# Patient Record
Sex: Female | Born: 1985 | Race: Black or African American | Hispanic: No | Marital: Single | Smoking: Former smoker
Health system: Southern US, Community
[De-identification: ages and names within clinical notes are randomized; demographics above are authoritative.]

## PROBLEM LIST (undated history)

## (undated) DIAGNOSIS — B999 Unspecified infectious disease: Secondary | ICD-10-CM

## (undated) DIAGNOSIS — R51 Headache: Secondary | ICD-10-CM

## (undated) DIAGNOSIS — N926 Irregular menstruation, unspecified: Secondary | ICD-10-CM

## (undated) DIAGNOSIS — K219 Gastro-esophageal reflux disease without esophagitis: Secondary | ICD-10-CM

## (undated) DIAGNOSIS — Z659 Problem related to unspecified psychosocial circumstances: Secondary | ICD-10-CM

## (undated) DIAGNOSIS — O0930 Supervision of pregnancy with insufficient antenatal care, unspecified trimester: Secondary | ICD-10-CM

## (undated) HISTORY — DX: Headache: R51

## (undated) HISTORY — PX: TONSILLECTOMY AND ADENOIDECTOMY: SUR1326

## (undated) HISTORY — DX: Unspecified infectious disease: B99.9

## (undated) HISTORY — DX: Gastro-esophageal reflux disease without esophagitis: K21.9

## (undated) HISTORY — PX: TONSILLECTOMY: SUR1361

---
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
## 2005-06-13 ENCOUNTER — Emergency Department (HOSPITAL_COMMUNITY): Admission: EM | Admit: 2005-06-13 | Discharge: 2005-06-13 | Payer: Self-pay | Admitting: Family Medicine

## 2005-10-25 ENCOUNTER — Emergency Department (HOSPITAL_COMMUNITY): Admission: EM | Admit: 2005-10-25 | Discharge: 2005-10-25 | Payer: Self-pay | Admitting: Family Medicine

## 2007-03-26 ENCOUNTER — Emergency Department (HOSPITAL_COMMUNITY): Admission: EM | Admit: 2007-03-26 | Discharge: 2007-03-26 | Payer: Self-pay | Admitting: Family Medicine

## 2007-07-05 DIAGNOSIS — B999 Unspecified infectious disease: Secondary | ICD-10-CM

## 2007-07-05 HISTORY — DX: Unspecified infectious disease: B99.9

## 2007-08-12 ENCOUNTER — Other Ambulatory Visit: Payer: Self-pay | Admitting: Emergency Medicine

## 2007-08-12 ENCOUNTER — Ambulatory Visit: Payer: Self-pay | Admitting: Obstetrics & Gynecology

## 2007-08-12 ENCOUNTER — Inpatient Hospital Stay (HOSPITAL_COMMUNITY): Admission: AD | Admit: 2007-08-12 | Discharge: 2007-08-15 | Payer: Self-pay | Admitting: Obstetrics and Gynecology

## 2007-08-12 ENCOUNTER — Other Ambulatory Visit: Payer: Self-pay

## 2007-08-12 ENCOUNTER — Encounter: Payer: Self-pay | Admitting: Obstetrics & Gynecology

## 2008-11-02 ENCOUNTER — Emergency Department (HOSPITAL_COMMUNITY): Admission: EM | Admit: 2008-11-02 | Discharge: 2008-11-02 | Payer: Self-pay | Admitting: Emergency Medicine

## 2008-12-13 ENCOUNTER — Emergency Department (HOSPITAL_COMMUNITY): Admission: EM | Admit: 2008-12-13 | Discharge: 2008-12-13 | Payer: Self-pay | Admitting: Emergency Medicine

## 2009-02-15 IMAGING — US US OB COMP +14 WK
1 series · 11 of 11 positions shown · non-contrast
Comparison: none

OBSTETRICAL ULTRASOUND:

 This ultrasound exam was performed in the [HOSPITAL] Ultrasound Department.  The OB US report was generated in the AS system, and faxed to the ordering physician.  This report is also available in [REDACTED] PACS.

[Series 1: us ob comp +14 wk · 0.33mm/px · 11 of 11 slices shown]
[im 1/11]
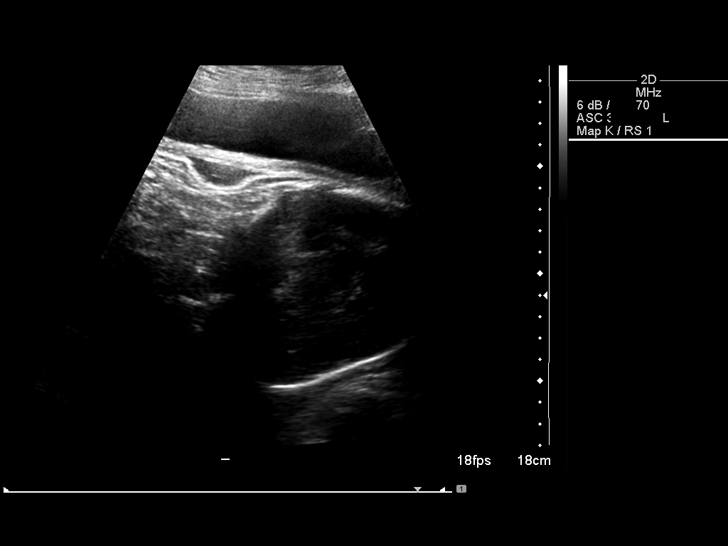
[im 2/11]
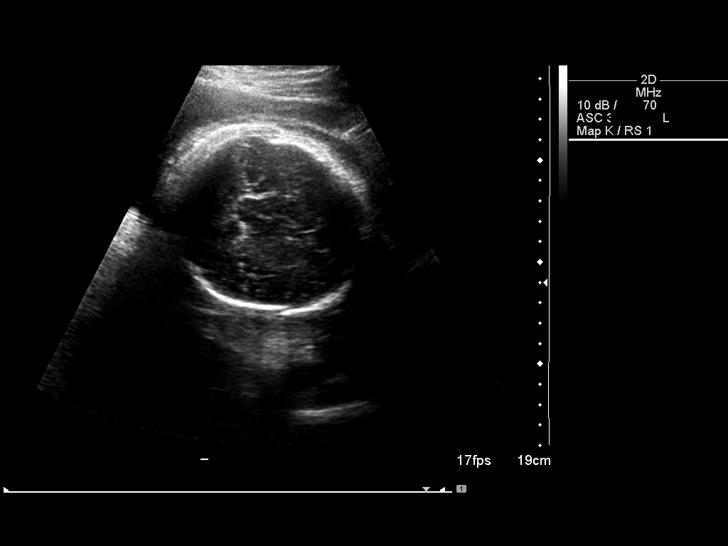
[im 3/11]
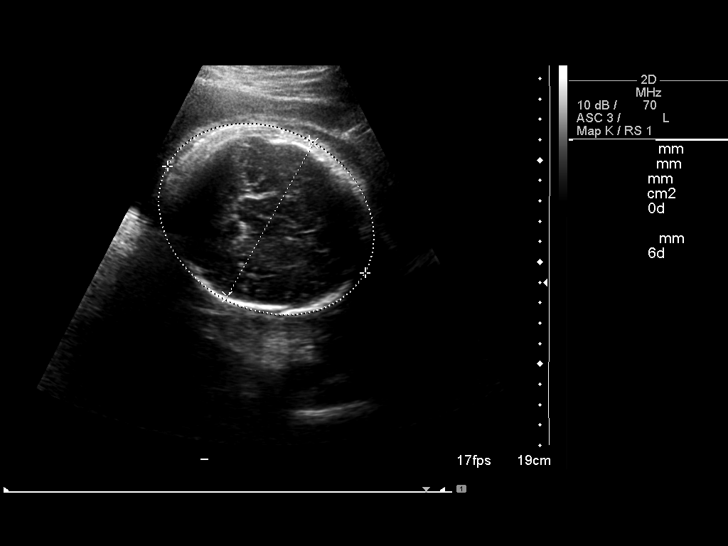
[im 4/11]
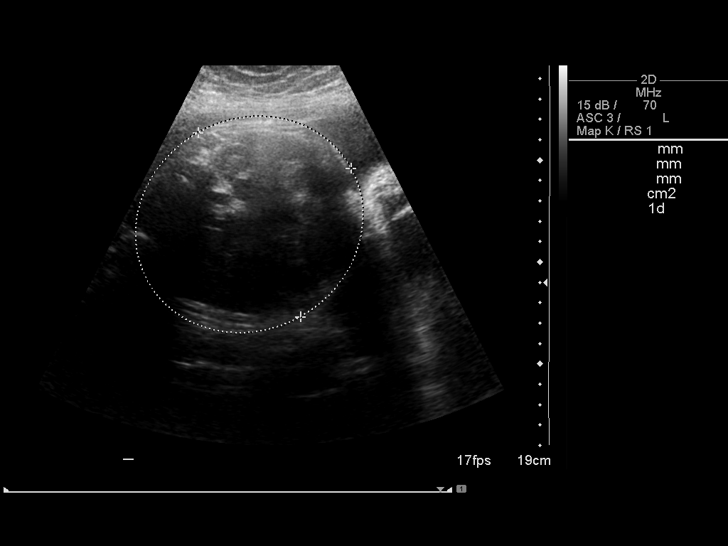
[im 5/11]
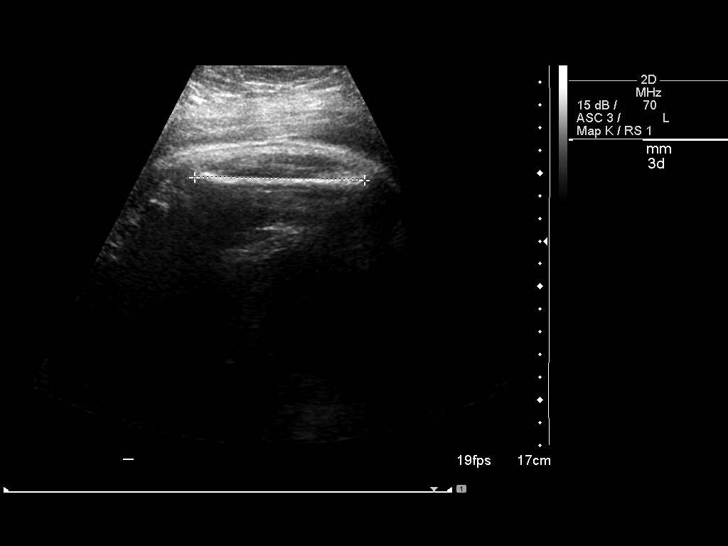
[im 6/11]
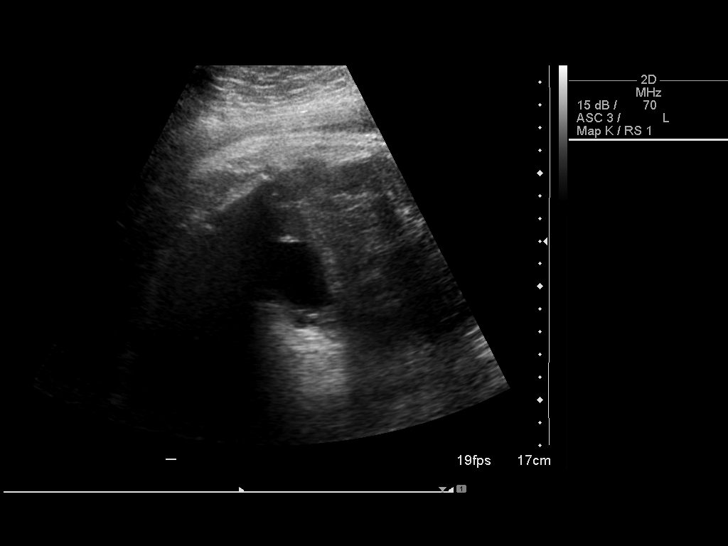
[im 7/11]
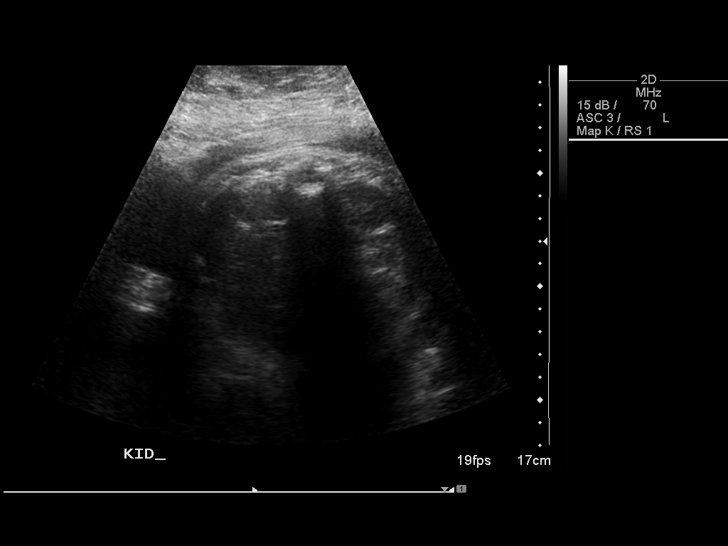
[im 8/11]
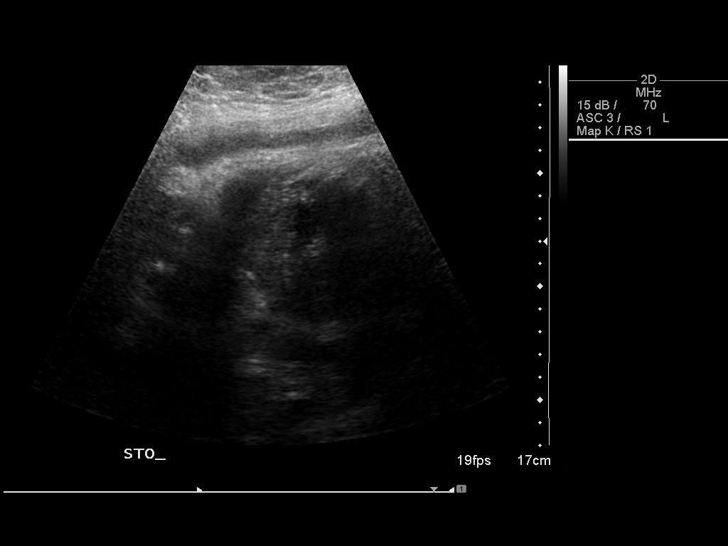
[im 9/11]
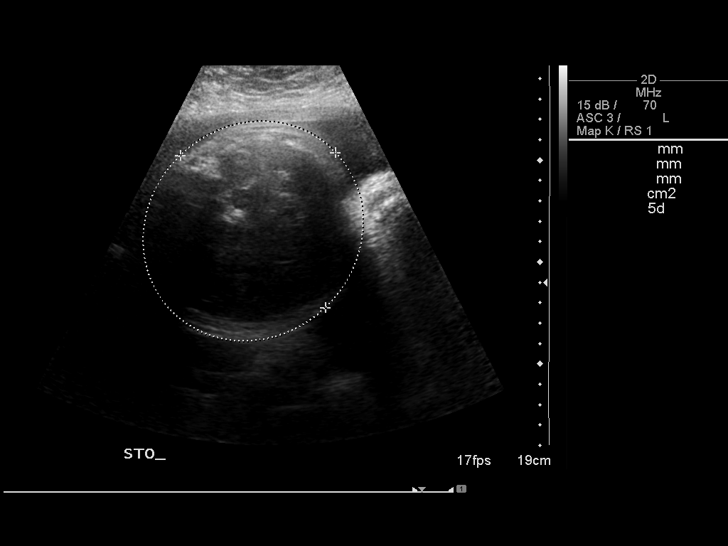
[im 10/11]
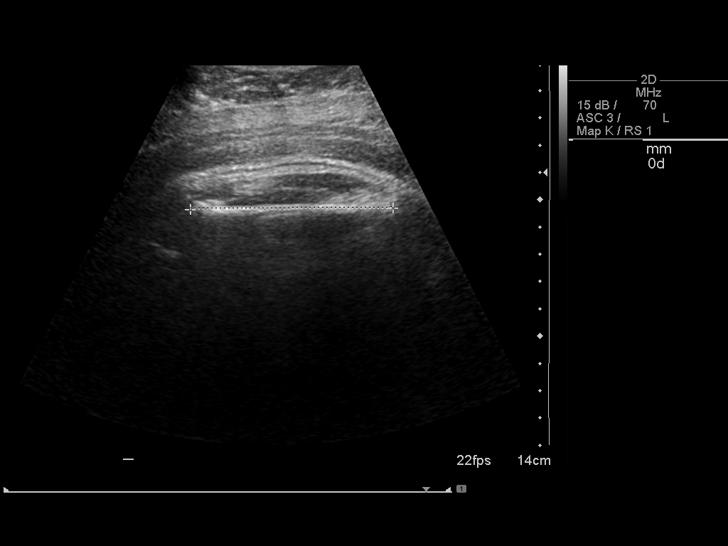
[im 11/11]
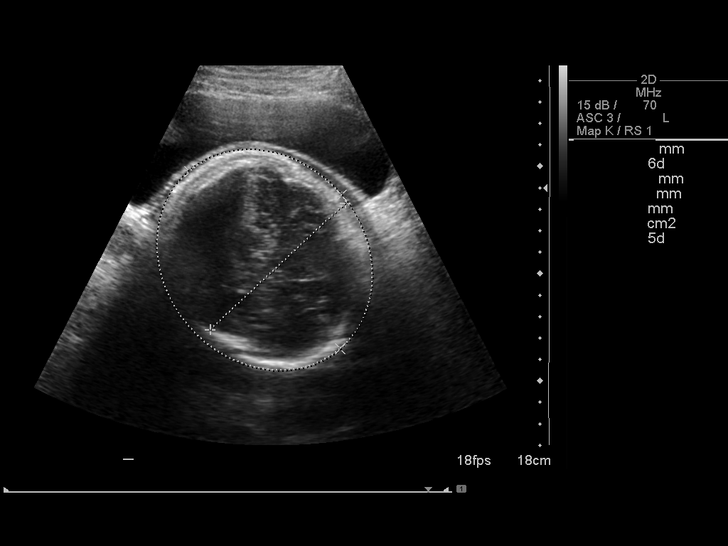

[11 of 11 positions shown; findings below may reference images not displayed]

IMPRESSION: See AS Obstetric US report.

## 2009-04-02 ENCOUNTER — Emergency Department (HOSPITAL_COMMUNITY): Admission: EM | Admit: 2009-04-02 | Discharge: 2009-04-02 | Payer: Self-pay | Admitting: Family Medicine

## 2009-08-16 ENCOUNTER — Emergency Department (HOSPITAL_COMMUNITY): Admission: EM | Admit: 2009-08-16 | Discharge: 2009-08-16 | Payer: Self-pay | Admitting: Family Medicine

## 2009-11-23 ENCOUNTER — Emergency Department (HOSPITAL_COMMUNITY): Admission: EM | Admit: 2009-11-23 | Discharge: 2009-11-24 | Payer: Self-pay | Admitting: Emergency Medicine

## 2010-04-09 ENCOUNTER — Emergency Department (HOSPITAL_COMMUNITY): Admission: EM | Admit: 2010-04-09 | Discharge: 2010-04-09 | Payer: Self-pay | Admitting: Emergency Medicine

## 2010-05-09 IMAGING — CT CT ABDOMEN W/ CM
2 of 4 series · 16 of 46 positions shown, 18 images · IV contrast (APPLIED)
Comparison: None.

CT ABDOMEN

CLINICAL DATA: 23-year-old female with mid abdominal pain and
nausea.

CT ABDOMEN AND PELVIS WITH CONTRAST
TECHNIQUE: Multidetector CT imaging of the abdomen and pelvis was
performed using the standard protocol following bolus
administration of intravenous contrast.
Contrast: 125 ml Lmnipaque-T55.

[Series 2: abd_pel 5.0 b40f st · axial · 0.95mm/px · z∈[-420,+0]mm · 13 of 94 slices shown, 15 images]
[im 5/94  soft-tissue]
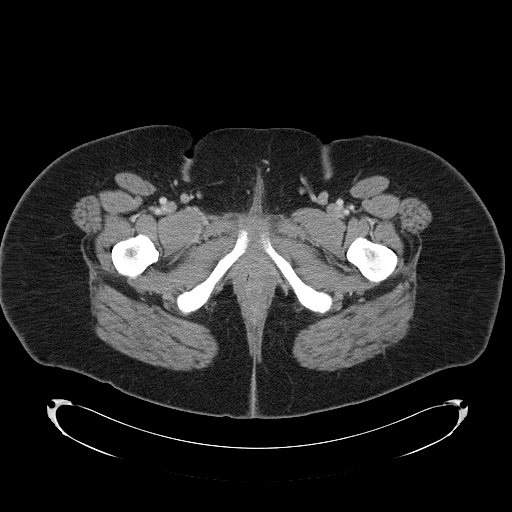
[im 5/94  bone]
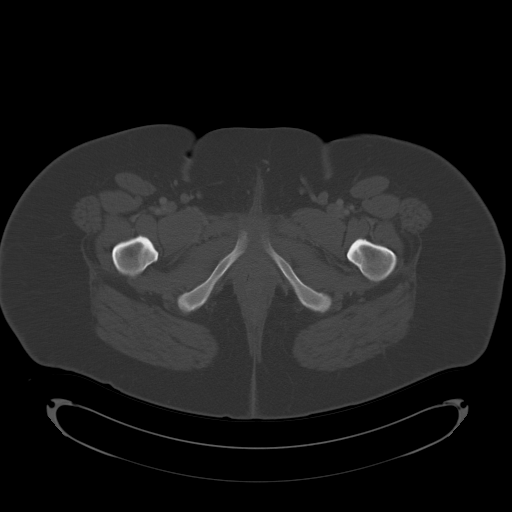
[im 13/94  soft-tissue]
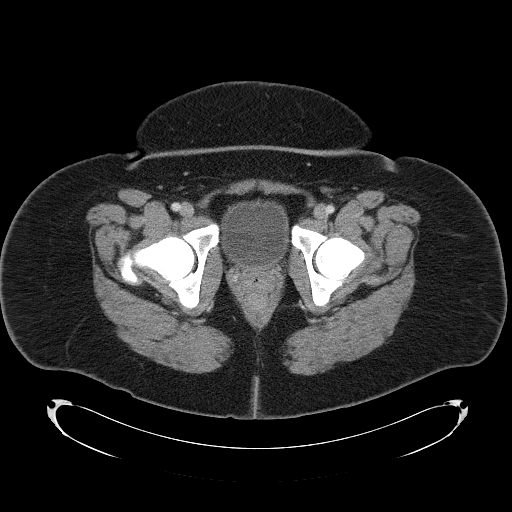
[im 21/94  soft-tissue]
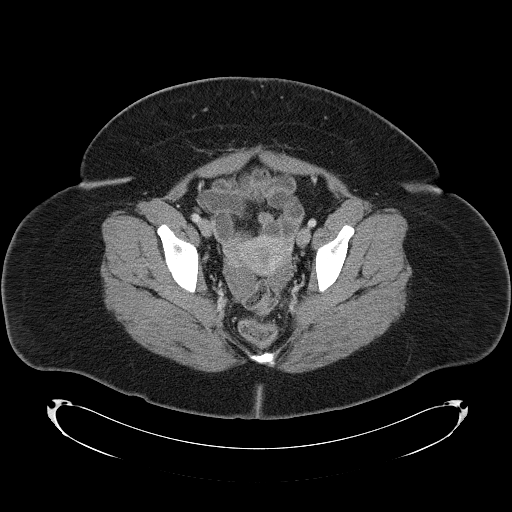
[im 25/94  soft-tissue]
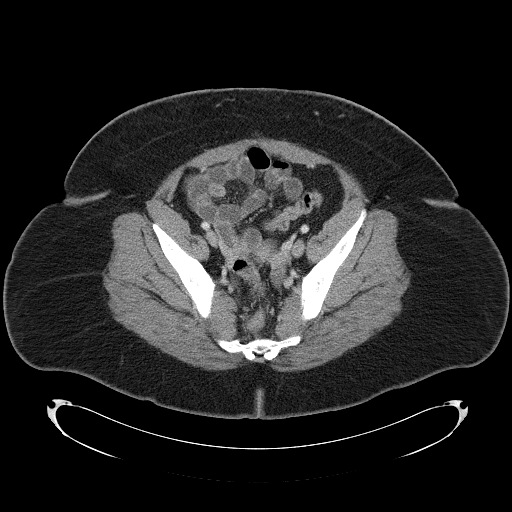
[im 33/94  soft-tissue]
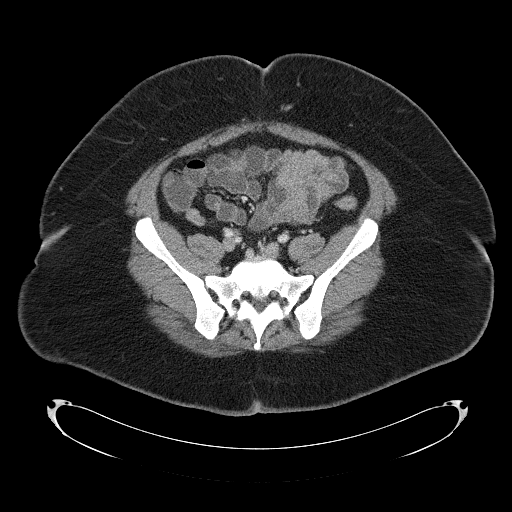
[im 41/94  soft-tissue]
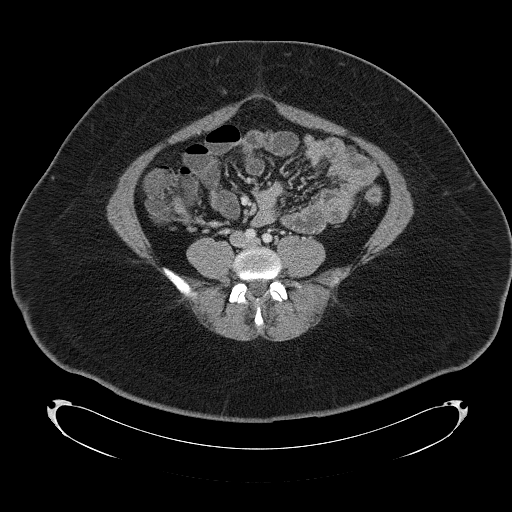
[im 49/94  soft-tissue]
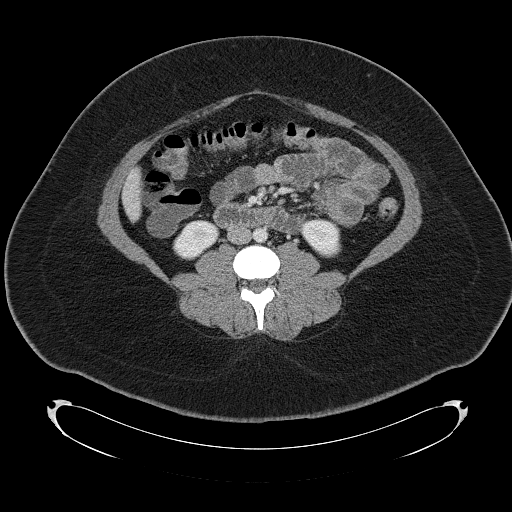
[im 53/94  soft-tissue]
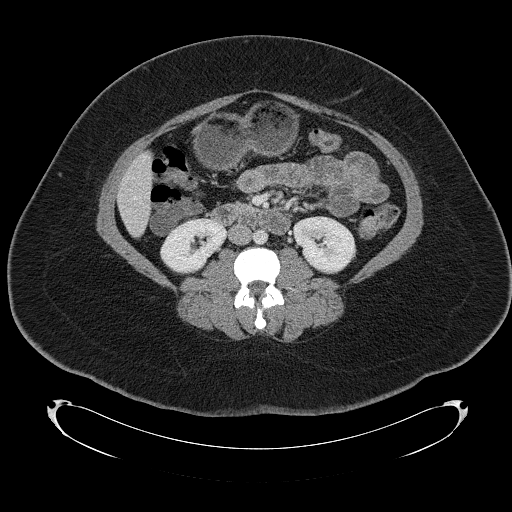
[im 61/94  soft-tissue]
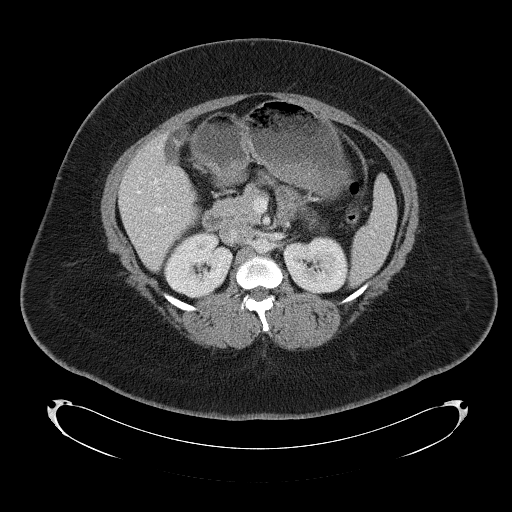
[im 61/94  bone]
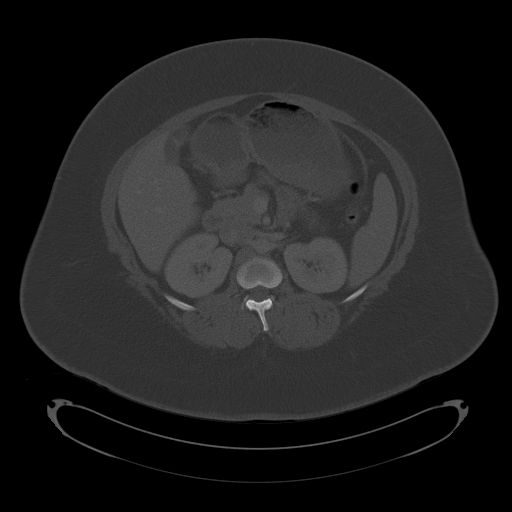
[im 69/94  soft-tissue]
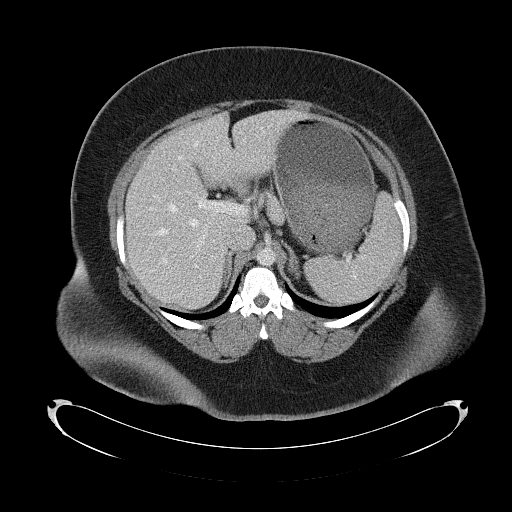
[im 73/94  soft-tissue]
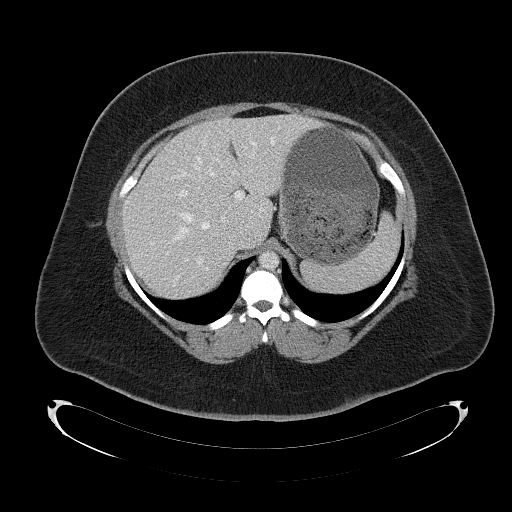
[im 81/94  soft-tissue]
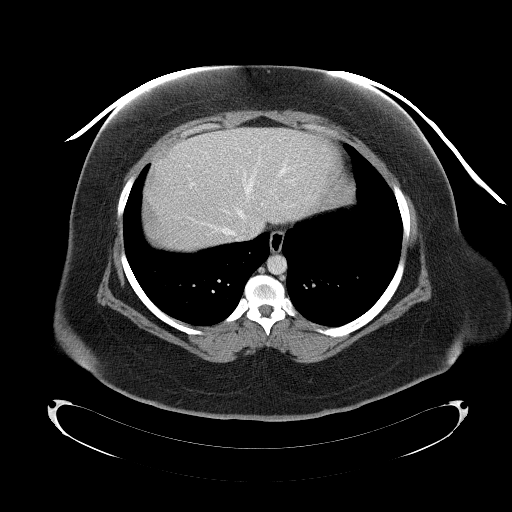
[im 89/94  soft-tissue]
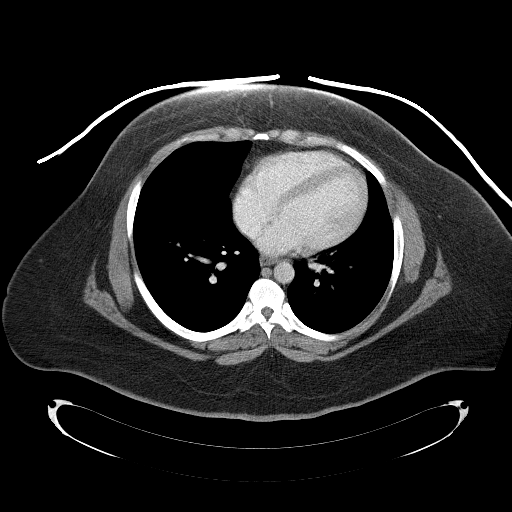

[Series 602: coronal · coronal · 0.96mm/px · 3 of 97 slices shown]
[im 33/97  soft-tissue]
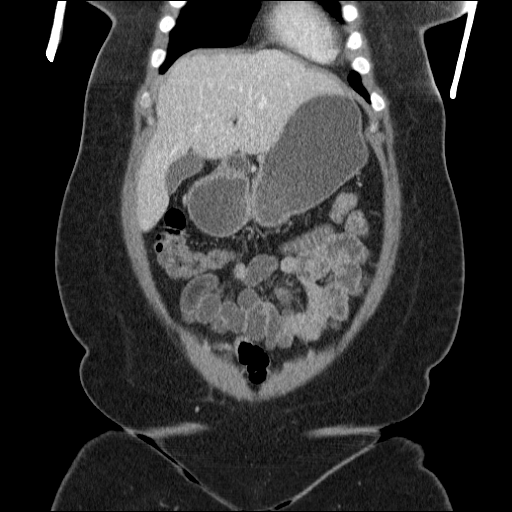
[im 43/97  soft-tissue]
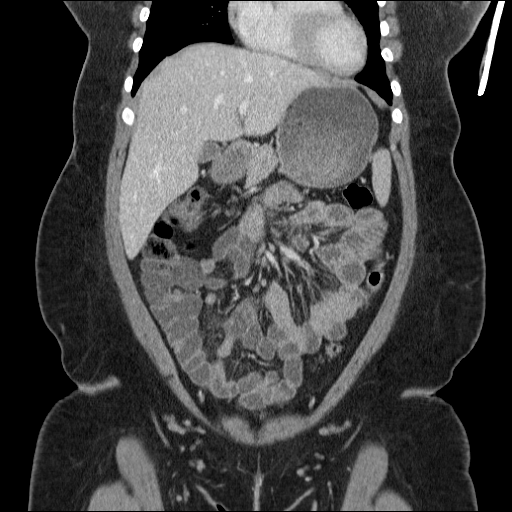
[im 54/97  soft-tissue]
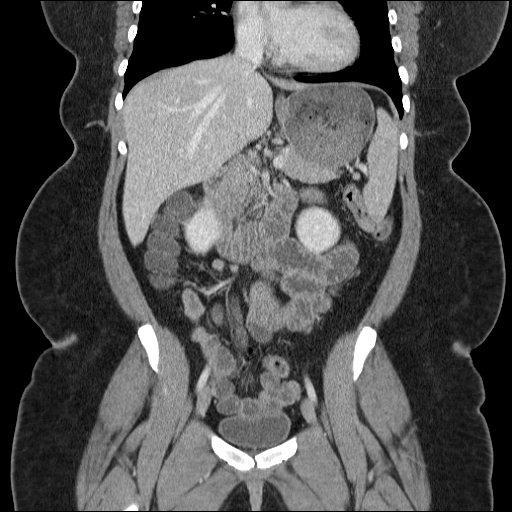

[16 of 46 positions shown; findings below may reference images not displayed]

FINDINGS: Lung bases are clear. No acute osseous abnormality
identified.  The liver, gallbladder, spleen, pancreas, adrenal
glands, and kidneys are within normal limits.  The portal venous
system and major abdominal arterial structures are within normal
limits.  The stomach contains water, which was used for enteric
contrast in this case, as well as food debris.  Aside from being
moderately distended, the stomach is within normal limits.  The
duodenum is within normal limits.  Proximal small bowel loops are
within normal limits.  Multiple fluid filled small bowel loops are
noted, but none appear abnormally dilated.  The terminal ileum is
within normal limits.  The appendix is suboptimally delineated, but
there are no pericecal inflammatory changes.  The ascending colon
is unremarkable.  From hepatic flexure on word, the colon is
relatively decompressed which is felt to account for the suggestion
of mild wall thickening.  Mesenteric lymph nodes are noted but not
enlarged by CT criteria, measuring up to 9 mm in short axis.  No
free fluid or focal inflammatory changes.
IMPRESSION: No acute or inflammatory findings identified in the abdomen.

CT PELVIS
FINDINGS: No pelvic free fluid.  The bladder is unremarkable.  The
adnexa are mildly prominent bilaterally (series 2 image 73), likely
physiologic in this age.  Otherwise, the uterus and parametrium are
within normal limits.  The distal colon is relatively decompressed
and unremarkable.  Major pelvic arterial structures are within
normal limits.  No focal inflammatory changes identified. No acute
osseous abnormality identified.
IMPRESSION: No acute or inflammatory findings identified in the pelvis.

## 2010-06-24 ENCOUNTER — Observation Stay (HOSPITAL_COMMUNITY)
Admission: EM | Admit: 2010-06-24 | Discharge: 2010-06-24 | Payer: Self-pay | Source: Home / Self Care | Attending: Emergency Medicine | Admitting: Emergency Medicine

## 2010-06-24 ENCOUNTER — Encounter (INDEPENDENT_AMBULATORY_CARE_PROVIDER_SITE_OTHER): Payer: Self-pay | Admitting: Emergency Medicine

## 2010-07-04 DIAGNOSIS — K219 Gastro-esophageal reflux disease without esophagitis: Secondary | ICD-10-CM

## 2010-07-04 HISTORY — DX: Gastro-esophageal reflux disease without esophagitis: K21.9

## 2010-08-18 ENCOUNTER — Emergency Department (HOSPITAL_COMMUNITY)
Admission: EM | Admit: 2010-08-18 | Discharge: 2010-08-18 | Disposition: A | Discharge status: Home / Self Care | Payer: Self-pay | Attending: Emergency Medicine | Admitting: Emergency Medicine

## 2010-08-18 DIAGNOSIS — H81319 Aural vertigo, unspecified ear: Secondary | ICD-10-CM | POA: Insufficient documentation

## 2010-08-18 LAB — URINE MICROSCOPIC-ADD ON

## 2010-08-18 LAB — POCT I-STAT, CHEM 8
BUN: 6 mg/dL (ref 6–23)
Creatinine, Ser: 0.7 mg/dL (ref 0.4–1.2)
Sodium: 140 mEq/L (ref 135–145)
TCO2: 25 mmol/L (ref 0–100)

## 2010-08-18 LAB — URINALYSIS, ROUTINE W REFLEX MICROSCOPIC
Hgb urine dipstick: NEGATIVE
Protein, ur: NEGATIVE mg/dL
Specific Gravity, Urine: 1.024 (ref 1.005–1.030)
Urine Glucose, Fasting: NEGATIVE mg/dL

## 2010-09-13 LAB — URINALYSIS, ROUTINE W REFLEX MICROSCOPIC
Ketones, ur: NEGATIVE mg/dL
Nitrite: NEGATIVE
Protein, ur: NEGATIVE mg/dL

## 2010-09-13 LAB — POCT I-STAT, CHEM 8
Chloride: 106 mEq/L (ref 96–112)
Glucose, Bld: 92 mg/dL (ref 70–99)
HCT: 34 % — ABNORMAL LOW (ref 36.0–46.0)
Potassium: 3.7 mEq/L (ref 3.5–5.1)
Sodium: 141 mEq/L (ref 135–145)

## 2010-09-13 LAB — POCT CARDIAC MARKERS
CKMB, poc: 1.4 ng/mL (ref 1.0–8.0)
CKMB, poc: <1 ng/mL — ABNORMAL LOW (ref 1.0–8.0)
CKMB, poc: <1 ng/mL — ABNORMAL LOW (ref 1.0–8.0)
Myoglobin, poc: 43.4 ng/mL (ref 12–200)
Myoglobin, poc: 48 ng/mL (ref 12–200)
Troponin i, poc: <0.05 ng/mL (ref 0.00–0.09)
Troponin i, poc: <0.05 ng/mL (ref 0.00–0.09)

## 2010-09-13 LAB — HEPATIC FUNCTION PANEL
ALT: 12 U/L (ref 0–35)
Indirect Bilirubin: 0.1 mg/dL — ABNORMAL LOW (ref 0.3–0.9)
Total Protein: 6.4 g/dL (ref 6.0–8.3)

## 2010-09-13 LAB — RAPID URINE DRUG SCREEN, HOSP PERFORMED
Benzodiazepines: NOT DETECTED
Cocaine: NOT DETECTED
Tetrahydrocannabinol: POSITIVE — AB

## 2010-09-13 LAB — DIFFERENTIAL
Basophils Absolute: 0 10*3/uL (ref 0.0–0.1)
Basophils Relative: 0 % (ref 0–1)
Eosinophils Relative: 4 % (ref 0–5)
Lymphocytes Relative: 31 % (ref 12–46)
Neutro Abs: 5.2 10*3/uL (ref 1.7–7.7)

## 2010-09-13 LAB — CBC
MCV: 82.1 fL (ref 78.0–100.0)
Platelets: 219 10*3/uL (ref 150–400)
RDW: 13.3 % (ref 11.5–15.5)
WBC: 8.8 10*3/uL (ref 4.0–10.5)

## 2010-09-16 LAB — RAPID URINE DRUG SCREEN, HOSP PERFORMED
Amphetamines: NOT DETECTED
Barbiturates: NOT DETECTED
Benzodiazepines: NOT DETECTED
Cocaine: NOT DETECTED
Opiates: NOT DETECTED
Tetrahydrocannabinol: POSITIVE — AB

## 2010-09-16 LAB — CBC
Hemoglobin: 13.2 g/dL (ref 12.0–15.0)
Platelets: 209 10*3/uL (ref 150–400)
RBC: 4.69 MIL/uL (ref 3.87–5.11)
WBC: 8.9 10*3/uL (ref 4.0–10.5)

## 2010-09-16 LAB — DIFFERENTIAL
Basophils Relative: 0 % (ref 0–1)
Lymphs Abs: 2.9 10*3/uL (ref 0.7–4.0)
Monocytes Relative: 6 % (ref 3–12)
Neutro Abs: 5.2 10*3/uL (ref 1.7–7.7)
Neutrophils Relative %: 59 % (ref 43–77)

## 2010-09-16 LAB — BASIC METABOLIC PANEL
Calcium: 9.1 mg/dL (ref 8.4–10.5)
Creatinine, Ser: 0.62 mg/dL (ref 0.4–1.2)
GFR calc Af Amer: >60 mL/min (ref >60)
GFR calc non Af Amer: >60 mL/min (ref >60)
Sodium: 135 mEq/L (ref 135–145)

## 2010-09-16 LAB — POCT CARDIAC MARKERS: Myoglobin, poc: 45.2 ng/mL (ref 12–200)

## 2010-10-08 LAB — POCT URINALYSIS DIP (DEVICE)
Bilirubin Urine: NEGATIVE
Ketones, ur: NEGATIVE mg/dL
Specific Gravity, Urine: 1.025 (ref 1.005–1.030)
pH: 6 (ref 5.0–8.0)

## 2010-10-12 LAB — URINE MICROSCOPIC-ADD ON

## 2010-10-12 LAB — URINALYSIS, ROUTINE W REFLEX MICROSCOPIC
Glucose, UA: NEGATIVE mg/dL
Nitrite: NEGATIVE
Specific Gravity, Urine: 1.007 (ref 1.005–1.030)
pH: 6 (ref 5.0–8.0)

## 2010-10-12 LAB — CBC
Platelets: 204 10*3/uL (ref 150–400)
RBC: 5.21 MIL/uL — ABNORMAL HIGH (ref 3.87–5.11)
WBC: 7.9 10*3/uL (ref 4.0–10.5)

## 2010-10-12 LAB — COMPREHENSIVE METABOLIC PANEL
ALT: 31 U/L (ref 0–35)
AST: 25 U/L (ref 0–37)
Albumin: 3.6 g/dL (ref 3.5–5.2)
CO2: 25 mEq/L (ref 19–32)
Calcium: 8.9 mg/dL (ref 8.4–10.5)
Chloride: 106 mEq/L (ref 96–112)
GFR calc Af Amer: >60 mL/min (ref >60)
GFR calc non Af Amer: >60 mL/min (ref >60)
Sodium: 138 mEq/L (ref 135–145)
Total Bilirubin: 0.8 mg/dL (ref 0.3–1.2)

## 2010-10-12 LAB — PREGNANCY, URINE: Preg Test, Ur: NEGATIVE

## 2010-10-12 LAB — DIFFERENTIAL
Eosinophils Relative: 3 % (ref 0–5)
Lymphs Abs: 1.2 10*3/uL (ref 0.7–4.0)
Monocytes Absolute: 0.8 10*3/uL (ref 0.1–1.0)
Monocytes Relative: 10 % (ref 3–12)
Neutro Abs: 5.7 10*3/uL (ref 1.7–7.7)

## 2010-11-07 ENCOUNTER — Emergency Department (HOSPITAL_COMMUNITY)
Admission: EM | Admit: 2010-11-07 | Discharge: 2010-11-07 | Disposition: A | Discharge status: Home / Self Care | Payer: Medicaid Other | Attending: Emergency Medicine | Admitting: Emergency Medicine

## 2010-11-07 DIAGNOSIS — E669 Obesity, unspecified: Secondary | ICD-10-CM | POA: Insufficient documentation

## 2010-11-07 DIAGNOSIS — M545 Low back pain, unspecified: Secondary | ICD-10-CM | POA: Insufficient documentation

## 2010-11-07 DIAGNOSIS — R0989 Other specified symptoms and signs involving the circulatory and respiratory systems: Secondary | ICD-10-CM | POA: Insufficient documentation

## 2010-11-07 DIAGNOSIS — R51 Headache: Secondary | ICD-10-CM | POA: Insufficient documentation

## 2010-11-16 NOTE — Discharge Summary (Signed)
Ashley [name], FALWELL           ACCOUNT NO.:  [id number]   MEDICAL RECORD NO.:  [id number]          PATIENT TYPE:  INP   LOCATION:  9102                          FACILITY:  [facility]   PHYSICIAN:  Lazaro Arms, M.D.   DATE OF BIRTH:  [birth year]   DATE OF ADMISSION:  08/12/2007  DATE OF DISCHARGE:  08/15/2007                               DISCHARGE SUMMARY   The patient was admitted on August 12, 2007, as a primigravida who  presented to [facility] ER with no prenatal care and complaints of  abdominal pain.  The patient was evaluated and admitted via the  unassigned on-call physician, Dr. Normand Sloop, and then was transferred to  faculty practice service and accepted by Dr. Lazaro Arms.  The  patient was found to be in labor and was given an epidural.  Her  estimated gestational age was approximately 66 weeks' gestation based on  a 36-week ultrasound.  Throughout the course of her admission, the  patient progressed to 6 to 7 cm at a +2 station, however, was unable to  dilate any further, so the decision was made to proceed with a primary  low transverse cesarean section secondary to the arrest of dilatation.  That surgery was performed by Dr. Lazaro Arms and produced a viable  female infant with Apgars of 4, 5 and 9.  Please see the dictated  operative note for details of the surgery.  Postpartum the patient has  had an uneventful postpartum and postoperative course.  She has remained  afebrile.  Her vital signs have remained stable and exam has remained  within normal limits.  The patient is being discharged today, on  August 15, 2007, as a 25 year old gravida 1, para 0-1-0-1, on  postoperative day 3 status post primary low transverse cesarean section.  She is bottle feeding.  She plans to use Implanon, which will be placed  at her 6-week postpartum visit.  She is being given prescriptions for  Percocet, ibuprofen and prenatal vitamins and has instructions to follow  up in 6 weeks  for postpartum visit at [facility] or the [facility]  of her choice.  The patient is in stable status and will be discharged  home with her infant.      Maylon Cos, C.N.M.      Lazaro Arms, M.D.  Electronically Signed    SS/MEDQ  D:  08/15/2007  T:  08/16/2007  Job:  [id number]

## 2010-11-16 NOTE — Op Note (Signed)
Ashley [name], BACKS           ACCOUNT NO.:  [id number]   MEDICAL RECORD NO.:  [id number]          PATIENT TYPE:  INP   LOCATION:  9102                          FACILITY:  [facility]   PHYSICIAN:  Lazaro Arms, M.D.   DATE OF BIRTH:  July 19, 1985   DATE OF PROCEDURE:  08/12/2007  DATE OF DISCHARGE:                               OPERATIVE REPORT   PREOPERATIVE DIAGNOSES:  1. Intrauterine pregnancy at 36 to [redacted] weeks gestation.  2. No prenatal care.  3. Secondary arrest of dilatation and descent.  4. Meconium stained amniotic fluid.   POSTOPERATIVE DIAGNOSES:  1. Intrauterine pregnancy at 36 to [redacted] weeks gestation.  2. No prenatal care.  3. Secondary arrest of dilatation and descent.  4. Meconium stained amniotic fluid.   OPERATION PERFORMED:  Primary low transverse cesarean section.   SURGEON:  Lazaro Arms, M.D.   ANESTHESIA:  Spinal.   FINDINGS:  Over a low transverse hysterotomy incision was delivered a  viable female infant with Apgars of 4, 5 and 9.  There was 3-vessel cord.  Cord blood was sent.  Cord gas pH 7.34 and it was arterial.  The Apgars  were somewhat depressed because they were doing __________  DeLee'd and  did not stimulate the baby at all and it took pediatrician in attendance  routine resuscitation a while to intubate the baby in order to aspirate  for meconium and just took some positive pressure oxygen to get the  heart rate back up after the bradycardic episode from the intubation.  Uterus, tubes and ovaries were otherwise normal.   DESCRIPTION OF PROCEDURE:  The patient was taken to the operating room  and placed in the supine position, already had an epidural placed.  She  had gotten to 6 cm and had not progressed at all for the last three  hours, had a large amount of caput __________ decision for cesarean  section.  She was prepped and draped in the usual sterile fashion.  A  Foley catheter was already in place.  Pfannenstiel skin incision was  made,  carried down sharply to the rectus fascia, scored in the midline,  extended laterally.  Fascia was taken off the muscle superiorly and  inferiorly without difficulty.  The muscles were divided, peritoneal  cavity was entered.  A bladder blade was placed.  Vesicouterine serosal  flap was created.  A low transverse hysterotomy incision was made.  Over  this incision was delivered a viable female infant with Apgars of 4, 5,  and 9.  Weight to be determined in the nursery.  He has three-vessel  cord, cord blood and cord gas were sent. The cord gas was 7.34 pH.  I  did a DeLee before the baby was completely delivered and neonatologist  was in attendance for routine neonatal resuscitation which included in  this case intubation for meconium aspirate, to aspirate the meconium out  of the stomach.  An Alexis self-retaining wound retractor was placed.  The uterus was exteriorized and closed in two layers, first running  interlocking layer, second imbricating layer.  This was done after the  placenta was delivered.  The placenta was delivered for pathological  evaluation.  The uterus was hemostatic, was replaced in peritoneal  cavity.  The pelvis and pericolic gutters were irrigated and there was  good hemostasis of the uterine incision.  The muscles and peritoneum  were  reapproximated loosely, the fascia closed using 0 Vicryl running  subcutaneous tissues made hemostatic and irrigated.  Skin was closed  using skin staples.  The patient tolerated the procedure well.  She  experienced 800 mL of blood loss, taken to recovery room in good and  stable condition.  All counts correct x3.      Lazaro Arms, M.D.  Electronically Signed     LHE/MEDQ  D:  08/12/2007  T:  08/13/2007  Job:  [id number]

## 2010-11-16 NOTE — H&P (Signed)
Ashley, [name]           ACCOUNT NO.:  [id number]   MEDICAL RECORD NO.:  [id number]          PATIENT TYPE:  INP   LOCATION:  9102                          [facility]ACILITY[facility]  [facility]   PHYSICIAN:  Ashley [name], M.D. DATE OF BIRTH:  12-May-1986   DATE OF ADMISSION:  08/12/2007  DATE OF DISCHARGE:                              HISTORY & PHYSICAL   CHIEF COMPLAINT:  Labor with no prenatal care.   HISTORY:  The patient is a 25 year old gravida 1, para 0 with  questionable LMP who presented to [facility] ER with abdominal pain.  The patient was found to be in early labor at 3-cm, and stated she did  not know she was pregnant and had no prenatal care and was then  transferred over to [facility].  Upon admission at [facility], the patient had a complete  ultrasound, was placed on a monitor, and found to be 5-cm and given  epidural for pain.   The patient denies having any past medical history.   PAST SURGICAL HISTORY:  Noted for having her tonsils removed.   MEDICATIONS:  The patient is on no medications.   ALLERGIES:  No known drug allergies.   FAMILY HISTORY:  Unremarkable.   PAST GYNECOLOGIC HISTORY:  The patient states her menses have always  been irregular.  No history of sexually transmitted diseases.   SOCIAL HISTORY:  The patient quit smoking a year ago.  No alcohol or  drug use.   PHYSICAL EXAMINATION:  GENERAL:  The patient is in no apparent distress.  HEART:  Regular rate and rhythm.  LUNGS:  Clear to auscultation bilaterally.  ABDOMEN:  Gravid, soft, and nontender.  PELVIC:  The patient has clitoromegaly.  Her cervix is 5, 80, minus 1  with light meconium.  EXTREMITIES:  Have no cyanosis and no clubbing and no calf pain  bilaterally.   Ultrasound shows the pregnancy is vertex measuring 3176 grams which is  the 80th percentile for 36 weeks.  Her NST, fetal heart tones around 135-  140, moderate long term variability.  The patient has an occasional  variable and occasional deceleration with good recovery.   LABORATORY:  White count 13.1 with a hemoglobin of 11.  UA is 40  ketones.  The rest of her prenatal panel is pending.   ASSESSMENT:  1. The patient is 36 to 38-weeks with spontaneous rupture of membranes      and light meconium in labor.  2. Clitoromegaly.   PLAN:  1. The patient will be admitted for labor.  2. She already had an epidural.  3. She was started on ampicillin IV because her GBS is unknown.  4. An intrauterine catheter was placed because the cervix was      unchanged from 5 this morning and if her MBUs are less than 200, we      will recommend start low dose Pitocin.   Dr. Despina [name] from faculty service was consulted and asked to take the  patient on his service.  We reviewed the strip together and he agreed to  take the transfer of the patient.  Ashley [name], M.D.  Electronically Signed     NAD/MEDQ  D:  08/12/2007  T:  08/13/2007  Job:  [id number]

## 2010-11-19 NOTE — Op Note (Signed)
[place]  Patient:    Ashley [name], Ashley [name] Visit Number: [id number] MRN: [id number]          Service Type: DSU Location: [facility] Attending Physician:  Lucky Cowboy Dictated by:   Lucky Cowboy, M.D. Proc. Date: 09/21/01 Admit Date:  09/21/2001 Discharge Date: 09/21/2001                             Operative Report  PREOPERATIVE DIAGNOSIS:  Obstructive sleep apnea.  POSTOPERATIVE DIAGNOSIS:  Obstructive sleep apnea.  OPERATION PERFORMED:  Adenotonsillectomy.  SURGEON:  Lucky Cowboy, M.D.  ANESTHESIA:  General endotracheal anesthesia.  ESTIMATED BLOOD LOSS:  30 cc.  SPECIMENS:  Adenoids and tonsils.  COMPLICATIONS:  None.  OPERATIVE FINDINGS:  The patient was noted to have a profuse amount of adenoid hypertrophy with obstruction of the posterior choana.  There were also kissing bilateral palatine tonsils which were somewhat cryptic.  DESCRIPTION OF PROCEDURE:  The patient was taken to the operating room and placed on the table in a supine position.  She was then placed under general endotracheal anesthesia and the table rotated counterclockwise 90 degrees. The eyes were taped shut and the head and body draped in the usual fashion. Bacitracin ointment was placed on the lips.  A shoulder roll was placed.  A Crowe-Davis mouth gag with a #3 tongue blade was then placed intraorally, opened and suspended on the Mayo stand.  Palpation of the soft palate was without evidence of a submucosal cleft.  A red rubber catheter was placed on the right nostril and brought out through the oral cavity and secured in place with a hemostat.  The adenoidectomy portion of the procedure was performed using a mirror.  A medium-sized adenoid curet was placed against the vomer with subsequent passes severing the adenoid pad.  Two sterile gauze Afrin soaked packs were placed in the nasopharynx and time allowed for hemostasis. The palate was relaxed and both the palatine  tonsils removed using the Harmonic scalpel.  The right palatine tonsil was grasped with Allis clamps directed inferomedially.  The Harmonic scalpel was then used to excise the tonsil, staying within the peritonsillar space adjacent to the tonsillar capsule.  The left palatine tonsil was removed in an identical fashion.  At this point, the palate was resuspended and the nasopharynx inspected.  Suction cautery used to ensure hemostasis.  The palate was relaxed and the nasopharynx was copiously transnasally which was suctioned out through the oral cavity. An NG tube was then placed down the esophagus for suctioning of the gastric contents.  Mouth gag was removed, noting no damage to the teeth or soft tissues.  The table was rotated clockwise 90 degrees to its original position.  The patient was awakened from anesthesia and extubated in the operating room.  She was taken to the post anesthesia care unit in stable condition.  There were no complications. Dictated by:   Lucky Cowboy, M.D. Attending Physician:  Lucky Cowboy DD:  10/24/01 TD:  10/25/01 Job: [id number]

## 2010-11-26 ENCOUNTER — Emergency Department (HOSPITAL_COMMUNITY)
Admission: EM | Admit: 2010-11-26 | Discharge: 2010-11-26 | Disposition: A | Discharge status: Home / Self Care | Payer: Medicaid Other | Attending: Emergency Medicine | Admitting: Emergency Medicine

## 2010-11-26 DIAGNOSIS — M5430 Sciatica, unspecified side: Secondary | ICD-10-CM | POA: Insufficient documentation

## 2010-11-26 DIAGNOSIS — M79609 Pain in unspecified limb: Secondary | ICD-10-CM | POA: Insufficient documentation

## 2010-11-26 DIAGNOSIS — E669 Obesity, unspecified: Secondary | ICD-10-CM | POA: Insufficient documentation

## 2010-11-26 DIAGNOSIS — M545 Low back pain, unspecified: Secondary | ICD-10-CM | POA: Insufficient documentation

## 2010-11-30 ENCOUNTER — Emergency Department (HOSPITAL_COMMUNITY)
Admission: EM | Admit: 2010-11-30 | Discharge: 2010-11-30 | Disposition: A | Discharge status: Home / Self Care | Payer: Medicaid Other | Attending: Emergency Medicine | Admitting: Emergency Medicine

## 2010-11-30 DIAGNOSIS — E669 Obesity, unspecified: Secondary | ICD-10-CM | POA: Insufficient documentation

## 2010-11-30 DIAGNOSIS — M25559 Pain in unspecified hip: Secondary | ICD-10-CM | POA: Insufficient documentation

## 2010-11-30 DIAGNOSIS — M5430 Sciatica, unspecified side: Secondary | ICD-10-CM | POA: Insufficient documentation

## 2011-03-25 LAB — CBC
HCT: 27.1 — ABNORMAL LOW
HCT: 31.9 — ABNORMAL LOW
Hemoglobin: 11 — ABNORMAL LOW
Hemoglobin: 11.6 — ABNORMAL LOW
Hemoglobin: 9.3 — ABNORMAL LOW
MCHC: 34.1
MCHC: 34.4
MCHC: 34.4
MCV: 78.4
Platelets: 147 — ABNORMAL LOW
RBC: 4.07
RBC: 4.35
RDW: 15.4
RDW: 15.5
RDW: 15.6 — ABNORMAL HIGH
WBC: 12.7 — ABNORMAL HIGH
WBC: 13.1 — ABNORMAL HIGH

## 2011-03-25 LAB — DIFFERENTIAL
Basophils Absolute: 0
Basophils Relative: 0
Basophils Relative: 0
Eosinophils Absolute: 0
Eosinophils Relative: 0
Lymphocytes Relative: 6 — ABNORMAL LOW
Lymphocytes Relative: 8 — ABNORMAL LOW
Lymphs Abs: 0.8
Monocytes Absolute: 0.7
Monocytes Absolute: 0.8
Monocytes Relative: 5
Monocytes Relative: 7
Neutro Abs: 10.7 — ABNORMAL HIGH
Neutro Abs: 11.5 — ABNORMAL HIGH
Neutrophils Relative %: 85 — ABNORMAL HIGH
Neutrophils Relative %: 88 — ABNORMAL HIGH

## 2011-03-25 LAB — URINALYSIS, ROUTINE W REFLEX MICROSCOPIC
Glucose, UA: NEGATIVE
Hgb urine dipstick: NEGATIVE
Protein, ur: NEGATIVE
Specific Gravity, Urine: 1.025
pH: 6.5

## 2011-03-25 LAB — URINE MICROSCOPIC-ADD ON

## 2011-03-25 LAB — BASIC METABOLIC PANEL
CO2: 19
Calcium: 9
Creatinine, Ser: 0.61
GFR calc Af Amer: >60
GFR calc non Af Amer: >60
Sodium: 137

## 2011-03-25 LAB — RUBELLA SCREEN: Rubella: 68.1 — ABNORMAL HIGH

## 2011-03-25 LAB — TYPE AND SCREEN: ABO/RH(D): B POS

## 2011-03-25 LAB — RPR: RPR Ser Ql: NONREACTIVE

## 2011-03-25 LAB — RAPID HIV SCREEN (WH-MAU): Rapid HIV Screen: NONREACTIVE

## 2011-03-25 LAB — SYPHILIS: RPR W/REFLEX TO RPR TITER AND TREPONEMAL ANTIBODIES, TRADITIONAL SCREENING AND DIAGNOSIS ALGORITHM: RPR Ser Ql: NONREACTIVE

## 2011-03-25 LAB — ABO/RH: ABO/RH(D): B POS

## 2011-10-14 ENCOUNTER — Emergency Department (HOSPITAL_COMMUNITY)
Admission: EM | Admit: 2011-10-14 | Discharge: 2011-10-15 | Discharge status: Against Medical Advice | Payer: Medicaid Other | Attending: Emergency Medicine | Admitting: Emergency Medicine

## 2011-10-14 DIAGNOSIS — R197 Diarrhea, unspecified: Secondary | ICD-10-CM | POA: Insufficient documentation

## 2011-10-14 DIAGNOSIS — R109 Unspecified abdominal pain: Secondary | ICD-10-CM | POA: Insufficient documentation

## 2011-10-14 DIAGNOSIS — R112 Nausea with vomiting, unspecified: Secondary | ICD-10-CM | POA: Insufficient documentation

## 2011-10-15 ENCOUNTER — Encounter (HOSPITAL_COMMUNITY): Payer: Self-pay | Admitting: *Deleted

## 2011-10-15 LAB — URINALYSIS, ROUTINE W REFLEX MICROSCOPIC
Glucose, UA: NEGATIVE mg/dL
Hgb urine dipstick: NEGATIVE
Specific Gravity, Urine: 1.034 — ABNORMAL HIGH (ref 1.005–1.030)
Urobilinogen, UA: 0.2 mg/dL (ref 0.0–1.0)
pH: 6 (ref 5.0–8.0)

## 2011-10-15 LAB — URINE MICROSCOPIC-ADD ON

## 2011-10-15 NOTE — ED Notes (Signed)
No answer x2 

## 2011-10-15 NOTE — ED Notes (Signed)
abd pain nv and diarrhea since this am. lmp irregular  unknown

## 2011-11-09 ENCOUNTER — Ambulatory Visit (INDEPENDENT_AMBULATORY_CARE_PROVIDER_SITE_OTHER): Payer: Medicaid Other | Admitting: Obstetrics and Gynecology

## 2011-11-09 DIAGNOSIS — Z331 Pregnant state, incidental: Secondary | ICD-10-CM

## 2011-11-09 LAB — POCT URINALYSIS DIPSTICK
Bilirubin, UA: NEGATIVE
Glucose, UA: NEGATIVE
Ketones, UA: NEGATIVE
Leukocytes, UA: NEGATIVE
Nitrite, UA: NEGATIVE
pH, UA: 7.5

## 2011-11-09 NOTE — Progress Notes (Signed)
PT UNSURE LMP.  HAS IRREG MENSES.  SCHEDULED FOR DATING U/S.

## 2011-11-10 ENCOUNTER — Other Ambulatory Visit: Payer: Self-pay | Admitting: Obstetrics and Gynecology

## 2011-11-10 ENCOUNTER — Ambulatory Visit (INDEPENDENT_AMBULATORY_CARE_PROVIDER_SITE_OTHER): Payer: Medicaid Other

## 2011-11-10 ENCOUNTER — Ambulatory Visit (INDEPENDENT_AMBULATORY_CARE_PROVIDER_SITE_OTHER): Payer: Medicaid Other | Admitting: Obstetrics and Gynecology

## 2011-11-10 ENCOUNTER — Encounter: Payer: Self-pay | Admitting: Obstetrics and Gynecology

## 2011-11-10 VITALS — BP 120/60 | Ht 64.0 in | Wt 262.0 lb

## 2011-11-10 DIAGNOSIS — O0930 Supervision of pregnancy with insufficient antenatal care, unspecified trimester: Secondary | ICD-10-CM

## 2011-11-10 DIAGNOSIS — Z331 Pregnant state, incidental: Secondary | ICD-10-CM

## 2011-11-10 DIAGNOSIS — Z98891 History of uterine scar from previous surgery: Secondary | ICD-10-CM

## 2011-11-10 DIAGNOSIS — E669 Obesity, unspecified: Secondary | ICD-10-CM

## 2011-11-10 DIAGNOSIS — Z9889 Other specified postprocedural states: Secondary | ICD-10-CM

## 2011-11-10 DIAGNOSIS — O34219 Maternal care for unspecified type scar from previous cesarean delivery: Secondary | ICD-10-CM

## 2011-11-10 LAB — PRENATAL PANEL VII
Basophils Absolute: 0 10*3/uL (ref 0.0–0.1)
Basophils Relative: 0 % (ref 0–1)
Eosinophils Absolute: 0.3 10*3/uL (ref 0.0–0.7)
HIV: NONREACTIVE
Hepatitis B Surface Ag: NEGATIVE
MCHC: 33.5 g/dL (ref 30.0–36.0)
Neutro Abs: 7.2 10*3/uL (ref 1.7–7.7)
Neutrophils Relative %: 74 % (ref 43–77)
RDW: 14 % (ref 11.5–15.5)
Rh Type: POSITIVE

## 2011-11-10 LAB — POCT WET PREP (WET MOUNT): pH: 4.5

## 2011-11-10 LAB — US OB COMP + 14 WK

## 2011-11-10 NOTE — Patient Instructions (Signed)

## 2011-11-10 NOTE — Progress Notes (Signed)
Pt found out today that she is not [redacted] wks pregnant, but 7 months pregnant. She stated this happen with her last pregnancy. Pt states she was told by [facility] that she was "5wks" pregnant.  Pt states they only did blood work.  Pt stated she does not have much to any support, no income, and no permanent home, And she does not know how she is going to prepare for her son in the short time of 2 months. Pt states she is not sure who may be the father of her child. Prenatal panel done 11-09-11. Last Pap per pt was 6 months ago at [facility] family practice "[facility]" Pt asked if she did have an STD would she be able to take medications.

## 2011-11-11 LAB — HEMOGLOBINOPATHY EVALUATION
Hemoglobin Other: 0 %
Hgb A2 Quant: 2.8 % (ref 2.2–3.2)
Hgb A: 97.2 % (ref 96.8–97.8)
Hgb F Quant: 0 % (ref 0.0–2.0)

## 2011-11-11 LAB — GC/CHLAMYDIA PROBE AMP, GENITAL
Chlamydia, DNA Probe: NEGATIVE
GC Probe Amp, Genital: NEGATIVE

## 2011-11-11 LAB — CULTURE, OB URINE: Colony Count: 50000

## 2011-11-13 ENCOUNTER — Encounter: Payer: Self-pay | Admitting: Obstetrics and Gynecology

## 2011-11-13 DIAGNOSIS — Z331 Pregnant state, incidental: Secondary | ICD-10-CM | POA: Insufficient documentation

## 2011-11-13 DIAGNOSIS — Z98891 History of uterine scar from previous surgery: Secondary | ICD-10-CM | POA: Insufficient documentation

## 2011-11-13 DIAGNOSIS — E669 Obesity, unspecified: Secondary | ICD-10-CM | POA: Insufficient documentation

## 2011-11-13 NOTE — Progress Notes (Signed)
Patient ID: Ashley [name], female   DOB: August 30, 1985, 26 y.o.   MRN: [id number]  The patient is a 26 year old female, gravida 2 para 1-0-0-1, who presents for her new OB exam.  She has not had prenatal care to date.  She has had a prior cesarean section.  Ultrasound today: Single gestation at 27 weeks and 4 days (due date February 05, 2012).  Normal fluid.  Breech.  Cervix 4.91 cm.  Normal placenta.  Heart views poorly seen.  Physical exam:  HEENT: Within normal limits Chest: Clear Heart: Regular rate and rhythm Breasts: No masses Abdomen: Gravid, fundal height 28 cm Extremities: Within normal limits Neurologic exam: Grossly normal Cervix: Closed and long Uterus: Gravid  Assessment:  27 week and 2 day gestation Late prenatal care Prior cesarean section Poor social situation Obesity  Plan:  Tuna fish, soft cheese, cats, medications, and prenatal vitamins discussed. VBAC consent form given to patient to read. Return to office in 2 weeks. GC and Chlamydia sent.  Dr. Stefano Gaul

## 2011-11-15 ENCOUNTER — Encounter: Payer: Medicaid Other | Admitting: Obstetrics and Gynecology

## 2011-12-01 ENCOUNTER — Ambulatory Visit (INDEPENDENT_AMBULATORY_CARE_PROVIDER_SITE_OTHER): Payer: Medicaid Other | Admitting: Obstetrics and Gynecology

## 2011-12-01 ENCOUNTER — Encounter: Payer: Self-pay | Admitting: Obstetrics and Gynecology

## 2011-12-01 VITALS — BP 110/60 | Wt 264.0 lb

## 2011-12-01 DIAGNOSIS — Z331 Pregnant state, incidental: Secondary | ICD-10-CM

## 2011-12-01 NOTE — Progress Notes (Signed)
Pt has questions about breastfeeding and c-section vs. Vaginal delivery, states that her last baby was delivered by c-section. Pt has not had glucola done but states that she is unable to stay for an hour at this appointment. Pt was unable to leave urine sample but will do it before she checks out today.

## 2011-12-05 ENCOUNTER — Telehealth: Payer: Self-pay | Admitting: Obstetrics and Gynecology

## 2011-12-05 NOTE — Telephone Encounter (Signed)
TC to pt. Regarding message about R/S appt.  States has scheduled for 12/06/11.

## 2011-12-05 NOTE — Telephone Encounter (Signed)
Triage/epic 

## 2011-12-06 ENCOUNTER — Ambulatory Visit (INDEPENDENT_AMBULATORY_CARE_PROVIDER_SITE_OTHER): Payer: Medicaid Other | Admitting: Obstetrics and Gynecology

## 2011-12-06 VITALS — BP 110/70 | Wt 263.0 lb

## 2011-12-06 DIAGNOSIS — Z331 Pregnant state, incidental: Secondary | ICD-10-CM

## 2011-12-06 NOTE — Progress Notes (Signed)
Pt states she has no concerns ?

## 2011-12-06 NOTE — Progress Notes (Addendum)
Obese abdomen. Patient will sign tubal papers today. Patient is undecided about VBAC.  Risks and benefits reviewed. The importance of glucose screening reviewed with the patient.  We'll screen next visit. Return office in 2 weeks.

## 2011-12-19 ENCOUNTER — Encounter: Payer: Medicaid Other | Admitting: Obstetrics and Gynecology

## 2011-12-22 ENCOUNTER — Ambulatory Visit (INDEPENDENT_AMBULATORY_CARE_PROVIDER_SITE_OTHER): Payer: Medicaid Other | Admitting: Obstetrics and Gynecology

## 2011-12-22 ENCOUNTER — Encounter: Payer: Self-pay | Admitting: Obstetrics and Gynecology

## 2011-12-22 VITALS — BP 106/50 | Wt 264.0 lb

## 2011-12-22 DIAGNOSIS — Z331 Pregnant state, incidental: Secondary | ICD-10-CM

## 2011-12-22 DIAGNOSIS — O26849 Uterine size-date discrepancy, unspecified trimester: Secondary | ICD-10-CM

## 2011-12-22 LAB — CBC
MCH: 25.8 pg — ABNORMAL LOW (ref 26.0–34.0)
MCHC: 33.5 g/dL (ref 30.0–36.0)
MCV: 76.9 fL — ABNORMAL LOW (ref 78.0–100.0)
Platelets: 175 10*3/uL (ref 150–400)
RBC: 4.11 MIL/uL (ref 3.87–5.11)
RDW: 15.6 % — ABNORMAL HIGH (ref 11.5–15.5)

## 2011-12-22 NOTE — Progress Notes (Signed)
C/O of pain in left cheek radiating to left thigh

## 2011-12-22 NOTE — Progress Notes (Signed)
Patient complained of Sciatia in Rt hip comfort measures reviewed. Gave brochure on back pain in pregnancy Glucola/ HgB + RPR today B+ blood type  Desires repeat C/S, no tubal, no MD preference USS next visit 2nd to size greater than dates.

## 2011-12-23 ENCOUNTER — Telehealth: Payer: Self-pay | Admitting: Obstetrics and Gynecology

## 2011-12-23 LAB — RPR

## 2011-12-23 NOTE — Telephone Encounter (Signed)
Repeat C/S Scheduled for 01/30/12 @ 11:00 with ND/CNM  -Adrianne Pridgen

## 2011-12-28 ENCOUNTER — Other Ambulatory Visit: Payer: Self-pay | Admitting: Obstetrics and Gynecology

## 2012-01-03 ENCOUNTER — Encounter: Payer: Medicaid Other | Admitting: Obstetrics and Gynecology

## 2012-01-03 ENCOUNTER — Other Ambulatory Visit: Payer: Medicaid Other

## 2012-01-09 ENCOUNTER — Other Ambulatory Visit: Payer: Self-pay | Admitting: Obstetrics and Gynecology

## 2012-01-09 ENCOUNTER — Encounter: Payer: Self-pay | Admitting: Obstetrics and Gynecology

## 2012-01-09 ENCOUNTER — Ambulatory Visit (INDEPENDENT_AMBULATORY_CARE_PROVIDER_SITE_OTHER): Payer: Medicaid Other | Admitting: Obstetrics and Gynecology

## 2012-01-09 ENCOUNTER — Other Ambulatory Visit: Payer: Medicaid Other

## 2012-01-09 ENCOUNTER — Ambulatory Visit (INDEPENDENT_AMBULATORY_CARE_PROVIDER_SITE_OTHER): Payer: Medicaid Other

## 2012-01-09 VITALS — BP 120/70 | Wt 269.0 lb

## 2012-01-09 DIAGNOSIS — O26849 Uterine size-date discrepancy, unspecified trimester: Secondary | ICD-10-CM

## 2012-01-09 DIAGNOSIS — Z3490 Encounter for supervision of normal pregnancy, unspecified, unspecified trimester: Secondary | ICD-10-CM

## 2012-01-09 DIAGNOSIS — Z331 Pregnant state, incidental: Secondary | ICD-10-CM

## 2012-01-09 LAB — US OB FOLLOW UP

## 2012-01-09 NOTE — Progress Notes (Signed)
Pt c/o increased pelvic pressure

## 2012-01-09 NOTE — Progress Notes (Signed)
F/U Growth USS:  EFW: 6lbs 1oz measures [redacted]w[redacted]d g growth Percentile 41.4%                                FHT: 134                               Presentation: Vx, Placenta posterior, AFI normal 70th% F/U 1 week Repeat growth in 1 - 2 weeks For scheduled repeat [facility] - Dr Normand Sloop

## 2012-01-10 LAB — GC/CHLAMYDIA PROBE AMP, GENITAL
Chlamydia, DNA Probe: NEGATIVE
GC Probe Amp, Genital: NEGATIVE

## 2012-01-11 ENCOUNTER — Telehealth: Payer: Self-pay | Admitting: Obstetrics and Gynecology

## 2012-01-11 NOTE — Telephone Encounter (Signed)
TC from pt.   States x 4 days, has had constipation and then loose watery stools. Had 2 stools today. Last normal BM 6 days ago.  Upper abd pain after eating Had pizza x 2 days and salad one day.Increased heartburn but has hx reflux.Edema of feet x2 wks.Headache 01/10/12 No visiondisturbances.No contractions. +FM Per [facility] advised BRAT diet x1-2 days,continue increased fluids.  May take Zantac or Prilosec. Call immediately with increase in symptoms or or improvement by 01/13/12 or with any concerns. Pt verbalizes comprehension.

## 2012-01-12 ENCOUNTER — Encounter: Payer: Medicaid Other | Admitting: Obstetrics and Gynecology

## 2012-01-12 ENCOUNTER — Telehealth: Payer: Self-pay | Admitting: Obstetrics and Gynecology

## 2012-01-12 NOTE — Telephone Encounter (Signed)
Pt called, states called yesterday with diarrhea, now is having constipation.  Has been going back and forth btn diarrhea and constipation.  Also c/o stomach pain located in middle of stomach that comes/goes has nausea @ times.  Pt says she has to strain to try to have a bm, but is passing gas, does have+fm but states not as much.  Pt has only eaten x 1 and had ginger ale and H2O only today.  Pt advised per SL to push fluids, try a dulcolax supp or fleet's enema, take Colace daily, monitor fm, should get 10 movements in 2 hours and if she wants to be seen in MAU to call and let SL know, pt voices understanding.

## 2012-01-13 LAB — STREP B DNA PROBE: GBSP: POSITIVE

## 2012-01-17 ENCOUNTER — Encounter: Payer: Self-pay | Admitting: Obstetrics and Gynecology

## 2012-01-17 ENCOUNTER — Ambulatory Visit (INDEPENDENT_AMBULATORY_CARE_PROVIDER_SITE_OTHER): Payer: Medicaid Other | Admitting: Obstetrics and Gynecology

## 2012-01-17 VITALS — BP 112/62 | Wt 265.0 lb

## 2012-01-17 DIAGNOSIS — Z331 Pregnant state, incidental: Secondary | ICD-10-CM

## 2012-01-17 NOTE — Progress Notes (Signed)
Pt states she has no concerns today. Pt Desires cervix check today. GBS positive.

## 2012-01-17 NOTE — Progress Notes (Signed)
Positive beta strep.  GC negative.  Chlamydia negative. C-section discussed. Return office in 1 week. Dr. Stefano Gaul

## 2012-01-18 ENCOUNTER — Encounter (HOSPITAL_COMMUNITY): Payer: Self-pay | Admitting: Pharmacist

## 2012-01-23 ENCOUNTER — Encounter (HOSPITAL_COMMUNITY): Payer: Self-pay

## 2012-01-23 ENCOUNTER — Encounter (HOSPITAL_COMMUNITY)
Admission: RE | Admit: 2012-01-23 | Discharge: 2012-01-23 | Disposition: A | Discharge status: Home / Self Care | Payer: Medicaid Other | Source: Ambulatory Visit | Attending: Obstetrics and Gynecology | Admitting: Obstetrics and Gynecology

## 2012-01-23 LAB — CBC
HCT: 31.6 % — ABNORMAL LOW (ref 36.0–46.0)
MCHC: 33.5 g/dL (ref 30.0–36.0)
Platelets: 170 10*3/uL (ref 150–400)
RDW: 14.9 % (ref 11.5–15.5)
WBC: 8.6 10*3/uL (ref 4.0–10.5)

## 2012-01-23 LAB — SURGICAL PCR SCREEN
MRSA, PCR: NEGATIVE
Staphylococcus aureus: POSITIVE — AB

## 2012-01-23 LAB — TYPE AND SCREEN
ABO/RH(D): B POS
Antibody Screen: NEGATIVE

## 2012-01-23 NOTE — Patient Instructions (Addendum)
YOUR PROCEDURE IS SCHEDULED ON:01/30/12  ENTER THROUGH THE MAIN ENTRANCE OF [facility] AT:0930  USE DESK PHONE AND DIAL [phone] TO INFORM us OF YOUR ARRIVAL  CALL [phone] IF YOU HAVE ANY QUESTIONS OR PROBLEMS PRIOR TO YOUR ARRIVAL.  REMEMBER: DO NOT EAT AFTER MIDNIGHT : SUNDAY   SPECIAL INSTRUCTIONS:water ok until 7am on MONDAY   YOU MAY BRUSH YOUR TEETH THE MORNING OF SURGERY   TAKE THESE MEDICINES THE DAY OF SURGERY WITH SIP OF WATER:none   DO NOT WEAR JEWELRY, EYE MAKEUP, LIPSTICK OR DARK FINGERNAIL POLISH DO NOT WEAR LOTIONS  DO NOT SHAVE FOR 48 HOURS PRIOR TO SURGERY  YOU WILL NOT BE ALLOWED TO DRIVE YOURSELF HOME.

## 2012-01-27 ENCOUNTER — Encounter: Payer: Self-pay | Admitting: Obstetrics and Gynecology

## 2012-01-27 ENCOUNTER — Ambulatory Visit (INDEPENDENT_AMBULATORY_CARE_PROVIDER_SITE_OTHER): Payer: Medicaid Other | Admitting: Obstetrics and Gynecology

## 2012-01-27 VITALS — BP 112/62 | Wt 262.0 lb

## 2012-01-27 DIAGNOSIS — Z3480 Encounter for supervision of other normal pregnancy, unspecified trimester: Secondary | ICD-10-CM

## 2012-01-27 NOTE — Progress Notes (Signed)
Pt scheduled for C-section on Monday 01/30/2012. No complaints.

## 2012-01-27 NOTE — Progress Notes (Signed)
Pt without c/o R&B of C/S reviewed

## 2012-01-27 NOTE — Patient Instructions (Signed)
Cesarean Delivery  °Cesarean delivery is the birth of a baby through a cut (incision) in the abdomen and womb (uterus).  °LET YOUR CAREGIVER KNOW ABOUT: °· Complications involving the pregnancy.  °· Allergies.  °· Medicines taken including herbs, eyedrops, over-the-counter medicines, and creams.  °· Use of steroids (by mouth or creams).  °· Previous problems with anesthetics or numbing medicine.  °· Previous surgery.  °· History of blood clots.  °· History of bleeding or blood problems.  °· Other health problems.  °RISKS AND COMPLICATIONS  °· Bleeding.  °· Infection.  °· Blood clots.  °· Injury to surrounding organs.  °· Anesthesia problems.  °· Injury to the baby.  °BEFORE THE PROCEDURE  °· A tube (Foley catheter) will be placed in your bladder. The Foley catheter drains the urine from your bladder into a bag. This keeps your bladder empty during surgery.  °· An intravenous access tube (IV) will be placed in your arm.  °· Hair may be removed from your pubic area and your lower abdomen. This is to prevent infection in the incision site.  °· You may be given an antacid medicine to drink. This will prevent acid contents in your stomach from going into your lungs if you vomit during the surgery.  °· You may be given an antibiotic medicine to prevent infection.  °PROCEDURE  °· You may be given medicine to numb the lower half of your body (regional anesthetic). If you were in labor, you may have already had an epidural in place which can be used in both labor and cesarean delivery. You may possibly be given medicine to make you sleep (general anesthetic) though this is not as common.  °· An incision will be made in your abdomen that extends to your uterus. There are 2 basic kinds of incisions:  °· The horizontal (transverse) incision. Horizontal incisions are used for most routine cesarean deliveries.  °· The vertical (up and down) incision. This is less commonly used. This is most often reserved for women who have a  serious complication (extreme prematurity) or under emergency situations.   °· The horizontal and vertical incisions may both be used at the same time. However, this is very uncommon.  °· Your baby will then be delivered.  °AFTER THE PROCEDURE  °· If you were awake during the surgery, you will see your baby right away. If you were asleep, you will see your baby as soon as you are awake.  °· You may breastfeed your baby after surgery.  °· You may be able to get up and walk the same day as the surgery. If you need to stay in bed for a period of time, you will receive help to turn, cough, and take deep breaths after surgery. This helps prevent lung problems such as pneumonia.  °· Do not get out of bed alone the first time after surgery. You will need help getting out of bed until you are able to do this by yourself.  °· You may be able to shower the day after your cesarean delivery.  After the bandage (dressing) is taken off the incision site, a nurse will assist you to shower, if you like.   °· You will have pneumatic compressing hose placed on your feet or lower legs. These hose are used to prevent blood clots. When you are up and walking regularly, they will no longer be necessary.   °· Do not cross your legs when you sit.  °· Save any blood clots that you   pass. If you pass a clot while on the toilet, do not flush it. Call for the nurse. Tell the nurse if you think you are bleeding too much or passing too many clots.  °· Start drinking liquids and eating food as directed by your caregiver. If your stomach is not ready, drinking and eating too soon can cause an increase in bloating and swelling of your intestine and abdomen. This is very uncomfortable.  °· You will be given medicine as needed. Let your caregivers know if you are hurting. They want you to be comfortable. You may also be given an antibiotic to prevent an infection.  °· Your IV will be taken out when you are drinking a reasonable amount of fluids. The  Foley catheter is taken out when you are up and walking.  °· If your blood type is Rh negative and your baby's blood type is Rh positive, you will be given a shot of anti-D immune globulin. This shot prevents you from having Rh problems with a future pregnancy. You should get the shot even if you had your tubes tied (tubal ligation).  °· If you are allowed to take the baby for a walk, place the baby in the bassinet and push it. Do not carry your baby in your arms.  °Document Released: 06/20/2005 Document Revised: 06/09/2011 Document Reviewed: 10/15/2010 °[employer]® Patient Information ©2012 [employer]. °

## 2012-01-30 ENCOUNTER — Encounter (HOSPITAL_COMMUNITY): Payer: Self-pay | Admitting: Anesthesiology

## 2012-01-30 ENCOUNTER — Encounter (HOSPITAL_COMMUNITY): Payer: Self-pay | Admitting: *Deleted

## 2012-01-30 ENCOUNTER — Inpatient Hospital Stay (HOSPITAL_COMMUNITY): Payer: Medicaid Other

## 2012-01-30 ENCOUNTER — Encounter (HOSPITAL_COMMUNITY)
Admission: RE | Disposition: A | Discharge status: Home / Self Care | Payer: Self-pay | Source: Ambulatory Visit | Attending: Obstetrics and Gynecology

## 2012-01-30 ENCOUNTER — Encounter (HOSPITAL_COMMUNITY): Payer: Self-pay

## 2012-01-30 ENCOUNTER — Inpatient Hospital Stay (HOSPITAL_COMMUNITY)
Admission: RE | Admit: 2012-01-30 | Discharge: 2012-02-02 | DRG: 766 | Disposition: A | Discharge status: Home / Self Care | Payer: Medicaid Other | Source: Ambulatory Visit | Attending: Obstetrics and Gynecology | Admitting: Obstetrics and Gynecology

## 2012-01-30 DIAGNOSIS — O99214 Obesity complicating childbirth: Secondary | ICD-10-CM | POA: Diagnosis present

## 2012-01-30 DIAGNOSIS — E669 Obesity, unspecified: Secondary | ICD-10-CM | POA: Diagnosis present

## 2012-01-30 DIAGNOSIS — N926 Irregular menstruation, unspecified: Secondary | ICD-10-CM

## 2012-01-30 DIAGNOSIS — O34219 Maternal care for unspecified type scar from previous cesarean delivery: Principal | ICD-10-CM | POA: Diagnosis present

## 2012-01-30 DIAGNOSIS — O0930 Supervision of pregnancy with insufficient antenatal care, unspecified trimester: Secondary | ICD-10-CM

## 2012-01-30 DIAGNOSIS — D649 Anemia, unspecified: Secondary | ICD-10-CM | POA: Diagnosis present

## 2012-01-30 DIAGNOSIS — Z98891 History of uterine scar from previous surgery: Secondary | ICD-10-CM | POA: Diagnosis not present

## 2012-01-30 DIAGNOSIS — O9902 Anemia complicating childbirth: Secondary | ICD-10-CM | POA: Diagnosis present

## 2012-01-30 DIAGNOSIS — Z609 Problem related to social environment, unspecified: Secondary | ICD-10-CM

## 2012-01-30 DIAGNOSIS — O99892 Other specified diseases and conditions complicating childbirth: Secondary | ICD-10-CM | POA: Diagnosis present

## 2012-01-30 DIAGNOSIS — Z22330 Carrier of Group B streptococcus: Secondary | ICD-10-CM

## 2012-01-30 DIAGNOSIS — Z659 Problem related to unspecified psychosocial circumstances: Secondary | ICD-10-CM

## 2012-01-30 HISTORY — DX: Problem related to social environment, unspecified: Z60.9

## 2012-01-30 HISTORY — DX: Problem related to unspecified psychosocial circumstances: Z65.9

## 2012-01-30 HISTORY — DX: Irregular menstruation, unspecified: N92.6

## 2012-01-30 HISTORY — DX: Supervision of pregnancy with insufficient antenatal care, unspecified trimester: O09.30

## 2012-01-30 LAB — TYPE AND SCREEN: ABO/RH(D): B POS

## 2012-01-30 SURGERY — Surgical Case
Anesthesia: Spinal | Site: Abdomen | Wound class: Clean Contaminated

## 2012-01-30 MED ORDER — DIPHENHYDRAMINE HCL 50 MG/ML IJ SOLN: 25.0000 mg | INTRAMUSCULAR | Status: DC | PRN

## 2012-01-30 MED ORDER — NALBUPHINE SYRINGE 5 MG/0.5 ML
5.0000 mg | INJECTION | INTRAMUSCULAR | Status: DC | PRN
  Administered 2012-01-30: 10 mg via SUBCUTANEOUS
  Filled 2012-01-30 (×2): qty 1

## 2012-01-30 MED ORDER — MIDAZOLAM HCL 2 MG/2ML IJ SOLN: 0.5000 mg | Freq: Once | INTRAMUSCULAR | Status: DC | PRN

## 2012-01-30 MED ORDER — SODIUM CHLORIDE 0.9 % IV SOLN
1.0000 ug/kg/h | INTRAVENOUS | Status: DC | PRN
  Filled 2012-01-30: qty 2.5

## 2012-01-30 MED ORDER — MORPHINE SULFATE (PF) 0.5 MG/ML IJ SOLN
INTRAMUSCULAR | Status: DC | PRN
  Administered 2012-01-30: .1 mg via INTRATHECAL

## 2012-01-30 MED ORDER — OXYTOCIN 40 UNITS IN LACTATED RINGERS INFUSION - SIMPLE MED: 62.5000 mL/h | INTRAVENOUS | Status: AC

## 2012-01-30 MED ORDER — ACETAMINOPHEN 10 MG/ML IV SOLN
1000.0000 mg | Freq: Four times a day (QID) | INTRAVENOUS | Status: AC | PRN
  Filled 2012-01-30: qty 100

## 2012-01-30 MED ORDER — FENTANYL CITRATE 0.05 MG/ML IJ SOLN
INTRAMUSCULAR | Status: DC | PRN
  Administered 2012-01-30: 25 ug via INTRATHECAL

## 2012-01-30 MED ORDER — METOCLOPRAMIDE HCL 5 MG/ML IJ SOLN: 10.0000 mg | Freq: Three times a day (TID) | INTRAMUSCULAR | Status: DC | PRN

## 2012-01-30 MED ORDER — OXYTOCIN 10 UNIT/ML IJ SOLN
40.0000 [IU] | INTRAVENOUS | Status: DC | PRN
  Administered 2012-01-30: 40 [IU] via INTRAVENOUS

## 2012-01-30 MED ORDER — LANOLIN HYDROUS EX OINT: 1.0000 "application " | TOPICAL_OINTMENT | CUTANEOUS | Status: DC | PRN

## 2012-01-30 MED ORDER — SCOPOLAMINE 1 MG/3DAYS TD PT72
1.0000 | MEDICATED_PATCH | Freq: Once | TRANSDERMAL | Status: DC
  Filled 2012-01-30: qty 1

## 2012-01-30 MED ORDER — PRENATAL MULTIVITAMIN CH
1.0000 | ORAL_TABLET | Freq: Every day | ORAL | Status: DC
Start: 2012-01-30 — End: 2012-02-02
  Administered 2012-01-31 – 2012-02-02 (×3): 1 via ORAL
  Filled 2012-01-30 (×3): qty 1

## 2012-01-30 MED ORDER — EPHEDRINE SULFATE 50 MG/ML IJ SOLN
INTRAMUSCULAR | Status: DC | PRN
  Administered 2012-01-30 (×5): 10 mg via INTRAVENOUS

## 2012-01-30 MED ORDER — FENTANYL CITRATE 0.05 MG/ML IJ SOLN
INTRAMUSCULAR | Status: AC
  Filled 2012-01-30: qty 2

## 2012-01-30 MED ORDER — NALBUPHINE SYRINGE 5 MG/0.5 ML
5.0000 mg | INJECTION | INTRAMUSCULAR | Status: DC | PRN
  Filled 2012-01-30: qty 1

## 2012-01-30 MED ORDER — MUPIROCIN 2 % EX OINT
TOPICAL_OINTMENT | Freq: Two times a day (BID) | CUTANEOUS | Status: DC
  Administered 2012-01-30: 1 via NASAL

## 2012-01-30 MED ORDER — DIPHENHYDRAMINE HCL 50 MG/ML IJ SOLN
INTRAMUSCULAR | Status: AC
  Administered 2012-01-30: 12.5 mg via INTRAVENOUS
  Filled 2012-01-30: qty 1

## 2012-01-30 MED ORDER — MORPHINE SULFATE 0.5 MG/ML IJ SOLN
INTRAMUSCULAR | Status: AC
  Filled 2012-01-30: qty 10

## 2012-01-30 MED ORDER — CEFAZOLIN SODIUM-DEXTROSE 2-3 GM-% IV SOLR
INTRAVENOUS | Status: AC
  Filled 2012-01-30: qty 50

## 2012-01-30 MED ORDER — FERROUS SULFATE 325 (65 FE) MG PO TABS
325.0000 mg | ORAL_TABLET | Freq: Two times a day (BID) | ORAL | Status: DC
  Administered 2012-01-31 – 2012-02-02 (×5): 325 mg via ORAL
  Filled 2012-01-30 (×5): qty 1

## 2012-01-30 MED ORDER — MUPIROCIN 2 % EX OINT
TOPICAL_OINTMENT | Freq: Two times a day (BID) | CUTANEOUS | Status: DC
  Administered 2012-01-30 – 2012-01-31 (×2): via NASAL
  Administered 2012-01-31: 1 via NASAL
  Administered 2012-02-01 – 2012-02-02 (×3): via NASAL

## 2012-01-30 MED ORDER — EPHEDRINE 5 MG/ML INJ
INTRAVENOUS | Status: AC
  Filled 2012-01-30: qty 10

## 2012-01-30 MED ORDER — CEFAZOLIN SODIUM-DEXTROSE 2-3 GM-% IV SOLR
2.0000 g | INTRAVENOUS | Status: AC
  Administered 2012-01-30: 2 g via INTRAVENOUS

## 2012-01-30 MED ORDER — MENTHOL 3 MG MT LOZG: 1.0000 | LOZENGE | OROMUCOSAL | Status: DC | PRN

## 2012-01-30 MED ORDER — DIBUCAINE 1 % RE OINT: 1.0000 "application " | TOPICAL_OINTMENT | RECTAL | Status: DC | PRN

## 2012-01-30 MED ORDER — SIMETHICONE 80 MG PO CHEW
80.0000 mg | CHEWABLE_TABLET | Freq: Three times a day (TID) | ORAL | Status: DC
  Administered 2012-01-30 – 2012-02-02 (×10): 80 mg via ORAL

## 2012-01-30 MED ORDER — FLEET ENEMA 7-19 GM/118ML RE ENEM: 1.0000 | ENEMA | Freq: Every day | RECTAL | Status: DC | PRN

## 2012-01-30 MED ORDER — KETOROLAC TROMETHAMINE 30 MG/ML IJ SOLN: 30.0000 mg | Freq: Four times a day (QID) | INTRAMUSCULAR | Status: AC | PRN

## 2012-01-30 MED ORDER — ONDANSETRON HCL 4 MG/2ML IJ SOLN
INTRAMUSCULAR | Status: AC
  Filled 2012-01-30: qty 2

## 2012-01-30 MED ORDER — MEASLES, MUMPS & RUBELLA VAC ~~LOC~~ INJ
0.5000 mL | INJECTION | Freq: Once | SUBCUTANEOUS | Status: DC
  Filled 2012-01-30: qty 0.5

## 2012-01-30 MED ORDER — TETANUS-DIPHTH-ACELL PERTUSSIS 5-2.5-18.5 LF-MCG/0.5 IM SUSP
0.5000 mL | Freq: Once | INTRAMUSCULAR | Status: AC
  Administered 2012-01-31: 0.5 mL via INTRAMUSCULAR
  Filled 2012-01-30: qty 0.5

## 2012-01-30 MED ORDER — OXYCODONE-ACETAMINOPHEN 5-325 MG PO TABS
1.0000 | ORAL_TABLET | ORAL | Status: DC | PRN
  Administered 2012-01-31 – 2012-02-02 (×14): 1 via ORAL
  Filled 2012-01-30 (×14): qty 1

## 2012-01-30 MED ORDER — DIPHENHYDRAMINE HCL 25 MG PO CAPS: 25.0000 mg | ORAL_CAPSULE | Freq: Four times a day (QID) | ORAL | Status: DC | PRN

## 2012-01-30 MED ORDER — OXYTOCIN 10 UNIT/ML IJ SOLN
INTRAMUSCULAR | Status: AC
  Filled 2012-01-30: qty 4

## 2012-01-30 MED ORDER — MEPERIDINE HCL 25 MG/ML IJ SOLN: 6.2500 mg | INTRAMUSCULAR | Status: DC | PRN

## 2012-01-30 MED ORDER — KETOROLAC TROMETHAMINE 30 MG/ML IJ SOLN
30.0000 mg | Freq: Four times a day (QID) | INTRAMUSCULAR | Status: AC | PRN
  Administered 2012-01-30 (×2): 30 mg via INTRAVENOUS
  Filled 2012-01-30: qty 1

## 2012-01-30 MED ORDER — LACTATED RINGERS IV SOLN
INTRAVENOUS | Status: DC
  Administered 2012-01-30: 21:00:00 via INTRAVENOUS

## 2012-01-30 MED ORDER — DIPHENHYDRAMINE HCL 25 MG PO CAPS
25.0000 mg | ORAL_CAPSULE | ORAL | Status: DC | PRN
  Administered 2012-01-31 (×2): 25 mg via ORAL
  Filled 2012-01-30 (×3): qty 1

## 2012-01-30 MED ORDER — ZOLPIDEM TARTRATE 5 MG PO TABS: 5.0000 mg | ORAL_TABLET | Freq: Every evening | ORAL | Status: DC | PRN

## 2012-01-30 MED ORDER — SIMETHICONE 80 MG PO CHEW: 80.0000 mg | CHEWABLE_TABLET | ORAL | Status: DC | PRN

## 2012-01-30 MED ORDER — ONDANSETRON HCL 4 MG PO TABS: 4.0000 mg | ORAL_TABLET | ORAL | Status: DC | PRN

## 2012-01-30 MED ORDER — ONDANSETRON HCL 4 MG/2ML IJ SOLN: 4.0000 mg | INTRAMUSCULAR | Status: DC | PRN

## 2012-01-30 MED ORDER — NALOXONE HCL 0.4 MG/ML IJ SOLN: 0.4000 mg | INTRAMUSCULAR | Status: DC | PRN

## 2012-01-30 MED ORDER — SODIUM CHLORIDE 0.9 % IJ SOLN: 3.0000 mL | INTRAMUSCULAR | Status: DC | PRN

## 2012-01-30 MED ORDER — SCOPOLAMINE 1 MG/3DAYS TD PT72
MEDICATED_PATCH | TRANSDERMAL | Status: AC
  Administered 2012-01-30: 1.5 mg
  Filled 2012-01-30: qty 1

## 2012-01-30 MED ORDER — DIPHENHYDRAMINE HCL 50 MG/ML IJ SOLN
12.5000 mg | INTRAMUSCULAR | Status: DC | PRN
  Administered 2012-01-30: 12.5 mg via INTRAVENOUS

## 2012-01-30 MED ORDER — LACTATED RINGERS IV SOLN
INTRAVENOUS | Status: DC
  Administered 2012-01-30: 11:00:00 via INTRAVENOUS
  Administered 2012-01-30: 125 mL/h via INTRAVENOUS
  Administered 2012-01-30: 12:00:00 via INTRAVENOUS

## 2012-01-30 MED ORDER — WITCH HAZEL-GLYCERIN EX PADS: 1.0000 "application " | MEDICATED_PAD | CUTANEOUS | Status: DC | PRN

## 2012-01-30 MED ORDER — BUPIVACAINE IN DEXTROSE 0.75-8.25 % IT SOLN
INTRATHECAL | Status: DC | PRN
Start: 2012-01-30 — End: 2012-01-30
  Administered 2012-01-30: 11 mg via INTRATHECAL

## 2012-01-30 MED ORDER — PROMETHAZINE HCL 25 MG/ML IJ SOLN: 6.2500 mg | INTRAMUSCULAR | Status: DC | PRN

## 2012-01-30 MED ORDER — ONDANSETRON HCL 4 MG/2ML IJ SOLN: 4.0000 mg | Freq: Three times a day (TID) | INTRAMUSCULAR | Status: DC | PRN

## 2012-01-30 MED ORDER — BISACODYL 10 MG RE SUPP: 10.0000 mg | Freq: Every day | RECTAL | Status: DC | PRN

## 2012-01-30 MED ORDER — MUPIROCIN 2 % EX OINT
TOPICAL_OINTMENT | CUTANEOUS | Status: AC
  Administered 2012-01-30: 1 via NASAL
  Filled 2012-01-30: qty 22

## 2012-01-30 MED ORDER — IBUPROFEN 600 MG PO TABS
600.0000 mg | ORAL_TABLET | Freq: Four times a day (QID) | ORAL | Status: DC
  Administered 2012-01-31 – 2012-02-02 (×9): 600 mg via ORAL
  Filled 2012-01-30 (×10): qty 1

## 2012-01-30 MED ORDER — SENNOSIDES-DOCUSATE SODIUM 8.6-50 MG PO TABS
2.0000 | ORAL_TABLET | Freq: Every day | ORAL | Status: DC
  Administered 2012-01-30 – 2012-02-01 (×3): 2 via ORAL

## 2012-01-30 MED ORDER — FENTANYL CITRATE 0.05 MG/ML IJ SOLN: 25.0000 ug | INTRAMUSCULAR | Status: DC | PRN

## 2012-01-30 MED ORDER — ONDANSETRON HCL 4 MG/2ML IJ SOLN
INTRAMUSCULAR | Status: DC | PRN
  Administered 2012-01-30: 4 mg via INTRAVENOUS

## 2012-01-30 MED ORDER — KETOROLAC TROMETHAMINE 30 MG/ML IJ SOLN
INTRAMUSCULAR | Status: AC
  Administered 2012-01-30: 30 mg via INTRAVENOUS
  Filled 2012-01-30: qty 1

## 2012-01-30 SURGICAL SUPPLY — 35 items
APL SKNCLS STERI-STRIP NONHPOA (GAUZE/BANDAGES/DRESSINGS) ×1
BENZOIN TINCTURE PRP APPL 2/3 (GAUZE/BANDAGES/DRESSINGS) ×2 IMPLANT
CHLORAPREP W/TINT 26ML (MISCELLANEOUS) ×2 IMPLANT
CLOTH BEACON ORANGE TIMEOUT ST (SAFETY) ×2 IMPLANT
CONTAINER PREFILL 10% NBF 15ML (MISCELLANEOUS) IMPLANT
DRAIN JACKSON PRT FLT 10 (DRAIN) ×1 IMPLANT
ELECT REM PT RETURN 9FT ADLT (ELECTROSURGICAL) ×2
ELECTRODE REM PT RTRN 9FT ADLT (ELECTROSURGICAL) ×1 IMPLANT
EVACUATOR SILICONE 100CC (DRAIN) ×1 IMPLANT
EXTRACTOR VACUUM M CUP 4 TUBE (SUCTIONS) ×1 IMPLANT
GLOVE BIO SURGEON STRL SZ 6.5 (GLOVE) ×2 IMPLANT
GLOVE BIOGEL PI IND STRL 7.0 (GLOVE) ×1 IMPLANT
GLOVE BIOGEL PI INDICATOR 7.0 (GLOVE) ×1
GOWN PREVENTION PLUS LG XLONG (DISPOSABLE) ×6 IMPLANT
KIT ABG SYR 3ML LUER SLIP (SYRINGE) IMPLANT
NDL HYPO 25X5/8 SAFETYGLIDE (NEEDLE) IMPLANT
NEEDLE HYPO 25X5/8 SAFETYGLIDE (NEEDLE) IMPLANT
NS IRRIG 1000ML POUR BTL (IV SOLUTION) ×2 IMPLANT
PACK C SECTION WH (CUSTOM PROCEDURE TRAY) ×2 IMPLANT
RTRCTR C-SECT PINK 25CM LRG (MISCELLANEOUS) ×1 IMPLANT
SLEEVE SCD COMPRESS KNEE MED (MISCELLANEOUS) ×1 IMPLANT
STAPLER VISISTAT 35W (STAPLE) IMPLANT
STRIP CLOSURE SKIN 1/2X4 (GAUZE/BANDAGES/DRESSINGS) ×2 IMPLANT
STRIP CLOSURE SKIN 1/4X4 (GAUZE/BANDAGES/DRESSINGS) ×1 IMPLANT
SUT CHROMIC 0 CT 1 (SUTURE) ×2 IMPLANT
SUT MNCRL AB 3-0 PS2 27 (SUTURE) ×1 IMPLANT
SUT PLAIN 2 0 (SUTURE) ×4
SUT PLAIN 2 0 XLH (SUTURE) ×2 IMPLANT
SUT PLAIN ABS 2-0 CT1 27XMFL (SUTURE) ×2 IMPLANT
SUT SILK 2 0 SH (SUTURE) ×1 IMPLANT
SUT VIC AB 0 CTX 36 (SUTURE) ×8
SUT VIC AB 0 CTX36XBRD ANBCTRL (SUTURE) ×4 IMPLANT
TOWEL OR 17X24 6PK STRL BLUE (TOWEL DISPOSABLE) ×4 IMPLANT
TRAY FOLEY CATH 14FR (SET/KITS/TRAYS/PACK) ×2 IMPLANT
WATER STERILE IRR 1000ML POUR (IV SOLUTION) ×2 IMPLANT

## 2012-01-30 NOTE — Transfer of Care (Signed)
Immediate Anesthesia Transfer of Care Note  Patient: Ashley [name]  Procedure(s) Performed: Procedure(s) (LRB): CESAREAN SECTION (N/A)  Patient Location: PACU  Anesthesia Type: Spinal  Level of Consciousness: awake  Airway & Oxygen Therapy: Patient Spontanous Breathing  Post-op Assessment: Report given to PACU RN and Post -op Vital signs reviewed and stable  Post vital signs: stable  Complications: No apparent anesthesia complications

## 2012-01-30 NOTE — Anesthesia Postprocedure Evaluation (Signed)
Anesthesia Post Note  Patient: Ashley [name]  Procedure(s) Performed: Procedure(s) (LRB): CESAREAN SECTION (N/A)  Anesthesia type: Spinal  Patient location: PACU  Post pain: Pain level controlled  Post assessment: Post-op Vital signs reviewed  Last Vitals:  Filed Vitals:   01/30/12 1305  BP:   Pulse:   Temp: 36.8 C  Resp: 16    Post vital signs: Reviewed  Level of consciousness: awake  Complications: No apparent anesthesia complications

## 2012-01-30 NOTE — Anesthesia Procedure Notes (Signed)
Spinal  Patient location during procedure: OR Start time: 01/30/2012 11:29 AM Staffing Anesthesiologist: Brayton Caves R Performed by: anesthesiologist  Preanesthetic Checklist Completed: patient identified, site marked, surgical consent, pre-op evaluation, timeout performed, IV checked, risks and benefits discussed and monitors and equipment checked Spinal Block Patient position: sitting Prep: DuraPrep Patient monitoring: heart rate, cardiac monitor, continuous pulse ox and blood pressure Approach: midline Location: L3-4 Injection technique: single-shot Needle Needle type: Sprotte  Needle gauge: 24 G Needle length: 9 cm Assessment Sensory level: T4 Additional Notes Patient identified.  Risk benefits discussed including failed block, incomplete pain control, headache, nerve damage, paralysis, blood pressure changes, nausea, vomiting, reactions to medication both toxic or allergic, and postpartum back pain.  Patient expressed understanding and wished to proceed.  All questions were answered.  Sterile technique used throughout procedure.  CSF was clear.  No parasthesia or other complications.  Please see nursing notes for vital signs.

## 2012-01-30 NOTE — Anesthesia Preprocedure Evaluation (Signed)
Anesthesia Evaluation  Patient identified by MRN, date of birth, ID band Patient awake    Reviewed: Allergy & Precautions, H&P , NPO status , Patient's Chart, lab work & pertinent test results  Airway Mallampati: III      Dental No notable dental hx.    Pulmonary neg pulmonary ROS,  breath sounds clear to auscultation  Pulmonary exam normal       Cardiovascular Exercise Tolerance: Good negative cardio ROS  Rhythm:regular Rate:Normal     Neuro/Psych  Headaches, negative neurological ROS  negative psych ROS   GI/Hepatic negative GI ROS, Neg liver ROS, GERD-  Controlled,  Endo/Other  negative endocrine ROSMorbid obesity  Renal/GU negative Renal ROS  negative genitourinary   Musculoskeletal   Abdominal Normal abdominal exam  (+)   Peds  Hematology negative hematology ROS (+)   Anesthesia Other Findings   Reproductive/Obstetrics (+) Pregnancy                           Anesthesia Physical Anesthesia Plan  ASA: III  Anesthesia Plan: Spinal   Post-op Pain Management:    Induction:   Airway Management Planned:   Additional Equipment:   Intra-op Plan:   Post-operative Plan:   Informed Consent: I have reviewed the patients History and Physical, chart, labs and discussed the procedure including the risks, benefits and alternatives for the proposed anesthesia with the patient or authorized representative who has indicated his/her understanding and acceptance.     Plan Discussed with: Anesthesiologist, CRNA and Surgeon  Anesthesia Plan Comments:         Anesthesia Quick Evaluation

## 2012-01-30 NOTE — H&P (Signed)
Ashley [name] is a 25 y.o.obese single black female presenting for scheduled repeat cesarean section.  No complaints this AM.  Denies LOF, VB, ctxs.  No recent illness or fever.  GFM.  Accompanied by a female "cousin."  Her mother has her 4y.o. Son for the next few days while she will be hospitalized.  She did not BF him, but plans to try this time.  No resp or GI c/o's.   Prenatal Course: Pt entered care 11/09/11 for first prenatal visit w/ unsure dates and gestation.  Anatomy u/s the next day gave best EDC of 8/4 and pt was 27weeks.  Anatomy scan WNL.  Pt's 3rd trimester was unremarkable.  She had normal pregnancy discomforts.  GBS cx positive.  1hr gtt WNL.  She had 2 growth u/s after anatomy and WNL (S>D via FH); last u/s at [redacted]w[redacted]d and EFW 41%.  She did experience some RLE sciatica around 33 weeks.  She is planning nexplanon PP.  She has an unstable home-life and unsure who is FOB--will have SW consult on m/b.   OB Hx:  G1=primary c/s 2009 for FTD; 37wks Female=7+7 "Landon" G2=current Pregnancy r/f:   Patient Active Problem List  Diagnosis  . Pregnant state, incidental  . Obesity  . H/O cesarean section  . Irregular periods/menstrual cycles  . Poor social situation  . Late prenatal care    History OB History    Grav Para Term Preterm Abortions TAB SAB Ect Mult Living   2 1 1       1      Obstetric Comments   NO PRENATAL CARE WITH FIRST PREGNANCY. 36-38 WKS  GEATATION.  "DID NOT KNOW SHE WAS PREGNANT UNTIL DELIVERY."     Past Medical History  Diagnosis Date  . Headache     3X WEEK  . 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 GERD (gastroesophageal reflux disease) 2012    diet controlled - no meds  . Irregular periods/menstrual cycles 01/30/2012  . Poor social situation 01/30/2012   Past Surgical History  Procedure Date  . Cesarean section 2009  . Tonsillectomy AGE 75  . Tonsillectomy and adenoidectomy AGEB 18   Family History: family history includes Asthma in her mother; Cancer in  her maternal grandmother; Early death in her mother; and Hypertension in her mother. Social History:  reports that she quit smoking about 3 months ago. Her smoking use included Cigarettes. She has a 3 pack-year smoking history. She has never used smokeless tobacco. She reports that she drinks about 1.5 ounces of alcohol per week. She reports that she does not use illicit drugs.Pt is unemployed.  FOB unknown.  Pt has lived in [place] her whole life.    Prenatal Transfer Tool  Maternal Diabetes: No Genetic Screening: Declined Maternal Ultrasounds/Referrals: Normal Fetal Ultrasounds or other Referrals:  None Maternal Substance Abuse:  No Significant Maternal Medications:  None Significant Maternal Lab Results:  Lab values include: Group B Strep positive Other Comments:  poor social status; unemployed.  Unsure FOB; late prenatal care  Review of Systems  Constitutional: Negative.   HENT: Negative.   Eyes: Negative.   Respiratory: Negative.   Cardiovascular: Negative.   Gastrointestinal: Negative.   Genitourinary: Negative.   Skin: Negative.   Neurological: Negative.       Blood pressure 121/72, pulse 86, temperature 98.1 F (36.7 C), temperature source Oral, resp. rate 18, SpO2 99.00%. Exam Physical Exam  Constitutional: She is oriented to person, place, and time. She appears  well-developed and well-nourished. No distress.  HENT:  Head: Normocephalic and atraumatic.  Eyes: Pupils are equal, round, and reactive to light.  Cardiovascular: Normal rate and regular rhythm.   Respiratory: Effort normal and breath sounds normal.  GI: Soft.       gravid  Genitourinary:       Deferred pelvic this morning  Musculoskeletal: She exhibits no edema.  Neurological: She is alert and oriented to person, place, and time. She has normal reflexes.  Skin: Skin is warm and dry.  Psychiatric: She has a normal mood and affect. Her behavior is normal. Judgment and thought content normal.    Prenatal  labs: ABO, Rh: --/--/B POS (07/29 0933) Antibody: NEG (07/29 0933) Rubella: 75.9 (05/08 1430) RPR: NON REACTIVE (07/22 1420)  HBsAg: NEGATIVE (05/08 1430)  HIV: NON REACTIVE (05/08 1430)  GBS: POSITIVE (07/08 1714)  1hr gtt=72  Assessment/Plan: 1.  [redacted]w[redacted]d 2.  Previous c/s w/ desire for repeat 3. GBS pos 4. Poor social status 5.  Obese 6.  Irregular cycles 7.  Late prenatal care  1.  Admit to [facility] with Dr. Normand Sloop as attending 2.  Routine pre-op orders 3.  Options of repeat c/s vs TOLAC disc'd; risks of c/s rev'd which include but are not limited to infection, bleeding, damage to surrounding organs, and anesthesia complications.  Following discussion, pt verbalized desire to proceed with repeat c/s for delivery. 4.  All questions answered. Plan to attempt to BF.  Plans outpt circ.   5.  Plan SW consult PP   Aleah Ahlgrim H 01/30/2012, 11:07 AM

## 2012-01-30 NOTE — Anesthesia Postprocedure Evaluation (Signed)
  Anesthesia Post Note  Patient: Ashley [name]  Procedure(s) Performed: Procedure(s) (LRB): CESAREAN SECTION (N/A)  Anesthesia type: Spinal  Patient location: Mother/Baby  Post pain: Pain level controlled  Post assessment: Post-op Vital signs reviewed  Last Vitals:  Filed Vitals:   01/30/12 1730  BP: 108/67  Pulse: 87  Temp: 36.9 C  Resp: 18    Post vital signs: Reviewed  Level of consciousness: awake  Complications: No apparent anesthesia complications

## 2012-01-30 NOTE — H&P (Signed)
Date of Initial H&P: 01/30/12  History reviewed, patient examined, no change in status, stable for surgery.

## 2012-01-30 NOTE — Addendum Note (Signed)
Addendum  created 01/30/12 1801 by Algis Greenhouse, CRNA   Modules edited:Notes Section

## 2012-01-30 NOTE — Op Note (Signed)
Cesarean Section Procedure Note   Ashley [name]  01/30/2012  Indications: Scheduled Proceedure/Maternal Request and H/O previous cesarean section   Pre-operative Diagnosis: Prior C/S.   Post-operative Diagnosis: Same   Surgeon: Surgeon(s) and Role:    * Michael Litter, MD - Primary   Assistants: Eustace Pen CNM  Anesthesia: spinal   Procedure Details:  The patient was seen in the Holding Room. The risks, benefits, complications, treatment options, and expected outcomes were discussed with the patient. The patient concurred with the proposed plan, giving informed consent. identified as Ernest Haber and the procedure verified as C-Section Delivery. A Time Out was held and the above information confirmed.  After induction of anesthesia, the patient was draped and prepped in the usual sterile manner. A transverse incision was made and carried down through the subcutaneous tissue to the fascia. Fascial incision was made in the midline and extended transversely. The fascia was separated from the underlying rectus muscle superiorly and inferiorly. The peritoneum was identified and entered. Peritoneal incision was extended longitudinally with good visualization of bowel and bladder. The alexsis retractor was placed in the abdomen.  The utero-vesical peritoneal reflection was incised transversely and the bladder flap was bluntly freed from the lower uterine segment. A low transverse uterine incision was made. Delivered from cephalic presentation was a  pound infant, with Apgar scores of 8 at one minute and 9 at five minutes. Cord ph was not sent the umbilical cord was clamped and cut cord blood was obtained for evaluation. The placenta was removed Intact and appeared normal. The uterine outline, tubes and ovaries appeared normal}. The uterine incision was closed with running locked sutures of 0Vicryl. A second layer 0 vicrlyl was used to imbricate the uterine incision    Hemostasis was  observed. Lavage was carried out until clear. The alexsis was removed.  The fascia was then reapproximated with running sutures of 0 vicryl.  A JP drain was placed in the subcutaneous space.  The space was reaproximated using 2-0 plain.  The subcuticular closure was performed using 3-73monocryl   On the skin  Instrument, sponge, and needle counts were correct prior the abdominal closure and were correct at the conclusion of the case.    Findings: female infant in vertex presentation.  Clear fluid.  Nuchal cord times one.  Body cord also noted.     Estimated Blood Loss:841ml  Total IV Fluids:   Urine Output: 325CC OF clear urine  Specimens: none   Complications: no complications  Disposition: PACU - hemodynamically stable.   Maternal Condition: stable   Baby condition / location:  nursery-stable  Attending Attestation: I was present and scrubbed for the entire procedure.   Signed: Surgeon(s): Michael Litter, MD

## 2012-01-31 ENCOUNTER — Encounter (HOSPITAL_COMMUNITY): Payer: Self-pay | Admitting: Obstetrics and Gynecology

## 2012-01-31 DIAGNOSIS — Z98891 History of uterine scar from previous surgery: Secondary | ICD-10-CM | POA: Diagnosis not present

## 2012-01-31 LAB — CBC
MCH: 25.8 pg — ABNORMAL LOW (ref 26.0–34.0)
MCHC: 33.2 g/dL (ref 30.0–36.0)
MCV: 77.6 fL — ABNORMAL LOW (ref 78.0–100.0)
Platelets: 140 10*3/uL — ABNORMAL LOW (ref 150–400)
RDW: 15.2 % (ref 11.5–15.5)

## 2012-01-31 NOTE — Progress Notes (Signed)
Subjective: Postpartum Day 1: Cesarean Delivery due to repeat. Patient reports no problems voiding.  Has been up ad lib, no syncope or dizziness.   Feeding:  Bottle Contraceptive:  Nexplanon  Objective: Vital signs in last 24 hours: Temp:  [97.3 F (36.3 C)-99.2 F (37.3 C)] 99.2 F (37.3 C) (07/30 1124) Pulse Rate:  [73-99] 73  (07/30 1124) Resp:  [16-20] 18  (07/30 1124) BP: (98-129)/(45-71) 108/68 mmHg (07/30 1124) SpO2:  [95 %-100 %] 97 % (07/30 1124) Weight:  [262 lb (118.842 kg)] 262 lb (118.842 kg) (07/29 1502)  Physical Exam:  General: alert Lochia: appropriate Uterine Fundus: firm Incision: Dressing CDI DVT Evaluation: No evidence of DVT seen on physical exam. Negative Homan's sign. JP drain:   45 cc overnight, 10 cc so far today--serosanguinous fluid.   Basename 01/31/12 0525  HGB 8.5*  HCT 25.6*  Pre-op Hgb 10.6 on 01/23/12  Assessment/Plan: Status post Cesarean section. Anemia without hemodynamic instability. Hx social issues (housing, support)  Plan: Check orthostatics Fe po SW consult.   Jeromie Gainor 01/31/2012, 1:45 PM

## 2012-01-31 NOTE — Progress Notes (Signed)
UR Chart review completed.  

## 2012-02-01 NOTE — Clinical Social Work Maternal (Signed)
    Clinical Social Work Department PSYCHOSOCIAL ASSESSMENT - MATERNAL/CHILD 02/01/2012  Patient:  FREDRICKA, KOHRS  Account Number:  [id number]  Admit Date:  01/30/2012  Marjo Bicker Name:   Carolin Coy    Clinical Social Worker:  Andy Gauss   Date/Time:  02/01/2012 12:51 PM  Date Referred:  02/01/2012   Referral source  CN     Referred reason  Other - See comment   Other referral source:    I:  FAMILY / HOME ENVIRONMENT Child's legal guardian:  PARENT  Guardian - Name Guardian - Age Guardian - Address  Zhana Jeangilles [address]; [place], [place] [address]  Lubertha Sayres 21    Other household support members/support persons Name Relationship DOB  Alfonso Ramus AUNT   Landon SON 41 years old   Other support:    II  PSYCHOSOCIAL DATA Information Source:  Patient Interview  Event organiser Employment:   Surveyor, quantity resources:  [employer] If Medicaid - County:  [place] Other  Sales executive  WIC   School / Grade:   Maternity Care Coordinator / Child Services Coordination / Early Interventions:  Cultural issues impacting care:    III  STRENGTHS Strengths  Adequate Resources  Home prepared for Child (including basic supplies)  Supportive family/friends   Strength comment:    IV  RISK FACTORS AND CURRENT PROBLEMS Current Problem:  YES   Risk Factor & Current Problem Patient Issue Family Issue Risk Factor / Current Problem Comment  Other - See comment Y N ? Poor social situation    V  SOCIAL WORK ASSESSMENT Sw referral received to assess pt's " poor social support," and unstable housing situation.  Pt told Sw that she was evicted from her home, last year due to none payment of rent.  She is currently living with an aunt and plans to live there until she is able to find a place of her own.  Pt's mother passed away 2 years ago.  She identifies her aunt as a major support person.  FOB is not involved.  She had an open CPS last year.   Her children were not removed and case was closed, as per worker.  She has all the necessary basic supplies for the infant.  She is working on obtaining a Financial risk analyst.  Pt has a stable home.  While her support system may be limited, she does  have resources.  Pt was appropriate and appears to be bonding well with the infant.      VI SOCIAL WORK PLAN Social Work Plan  No Further Intervention Required / No Barriers to Discharge   Type of pt/family education:   If child protective services report - county:   If child protective services report - date:   Information/referral to community resources comment:   Other social work plan:

## 2012-02-01 NOTE — Progress Notes (Signed)
Subjective: Postpartum Day 2: Cesarean Delivery Patient reports incisional pain, tolerating PO, + flatus and no problems voiding.    Objective: Vital signs in last 24 hours: Temp:  [97.8 F (36.6 C)-99.2 F (37.3 C)] 97.9 F (36.6 C) (07/31 0518) Pulse Rate:  [73-85] 80  (07/31 0518) Resp:  [18-98] 98  (07/31 0518) BP: (102-132)/(64-72) 127/72 mmHg (07/31 0518) SpO2:  [95 %-97 %] 95 % (07/30 1448)  Physical Exam:  General: alert and no distress Lochia: appropriate Uterine Fundus: firm Incision: dressing has a small stain DVT Evaluation: No evidence of DVT seen on physical exam.   Basename 01/31/12 0525  HGB 8.5*  HCT 25.6*    Assessment/Plan: Status post Cesarean section. Doing well postoperatively.  Ambulate. Encouraged to continue to breast feed.  Novah Goza V 02/01/2012, 10:32 AM

## 2012-02-02 MED ORDER — FERROUS SULFATE 325 (65 FE) MG PO TABS: 325.0000 mg | ORAL_TABLET | Freq: Two times a day (BID) | ORAL | Status: DC

## 2012-02-02 MED ORDER — OXYCODONE-ACETAMINOPHEN 5-325 MG PO TABS
1.0000 | ORAL_TABLET | ORAL | Status: AC | PRN
Start: 2012-02-02 — End: 2012-02-12

## 2012-02-02 MED ORDER — SENNOSIDES-DOCUSATE SODIUM 8.6-50 MG PO TABS: 2.0000 | ORAL_TABLET | Freq: Every day | ORAL | Status: DC

## 2012-02-02 MED ORDER — IBUPROFEN 600 MG PO TABS: 600.0000 mg | ORAL_TABLET | Freq: Four times a day (QID) | ORAL | Status: AC | PRN

## 2012-02-02 NOTE — Discharge Summary (Signed)
Physician Discharge Summary  Patient ID: Ashley [name] MRN: [id number] DOB/AGE: Jul 04, 1986 26 y.o.  Admit date: 01/30/2012 Discharge date: 02/02/2012  Admission Diagnoses: term pg for repeat c/s  Discharge Diagnoses: Principal Problem:  *Status post repeat low transverse cesarean section Active Problems:  Poor social situation nonlactating  Discharged Condition: stable  Hospital Course: C/S normal involution, nonlactating, plans nexplanon  Consults: None  Significant Diagnostic Studies: labs:  Results for orders placed during the hospital encounter of 01/30/12 (from the past 72 hour(s))  CBC     Status: Abnormal   Collection Time   01/31/12  5:25 AM      Component Value Range Comment   WBC 10.1  4.0 - 10.5 K/uL    RBC 3.30 (*) 3.87 - 5.11 MIL/uL    Hemoglobin 8.5 (*) 12.0 - 15.0 g/dL    HCT 40.9 (*) 81.1 - 46.0 %    MCV 77.6 (*) 78.0 - 100.0 fL    MCH 25.8 (*) 26.0 - 34.0 pg    MCHC 33.2  30.0 - 36.0 g/dL    RDW 91.4  78.2 - 95.6 %    Platelets 140 (*) 150 - 400 K/uL     Treatments: IV hydration  Discharge Exam: Blood pressure 109/70, pulse 78, temperature 98.5 F (36.9 C), temperature source Oral, resp. rate 18, weight 262 lb (118.842 kg), SpO2 100.00%, unknown if currently breastfeeding. General appearance: alert, cooperative and no distress Subjective: Postpartum Day 3: Cesarean Delivery Patient reports tolerating PO, + flatus, + BM and no problems voiding comfortable with po meds for pain.    Objective:   Vital signs in last 24 hours:BP 109/70  Pulse 78  Temp 98.5 F (36.9 C) (Oral)  Resp 18  Wt 262 lb (118.842 kg)  SpO2 100%  Breastfeeding? Unknown   Physical Exam:  General: alert, cooperative and no distress Lochia: appropriate Uterine Fundus: firm Incision:  DVT Evaluation: Negative Homan's sign. No significant calf/ankle edema. Lungs clear bilaterally AP RRR JP with small amount serous fluid removed intact gauze to site  Incision well  approximated no redness,edema, or drainage with steri strips Bowel sounds active abd soft, nt,  Disposition: home with newborn  Discharge Orders    Future Appointments: Provider: Department: Dept Phone: Center:   03/07/2012 10:45 AM Michael Litter, MD [facility] [phone] None     Medication List  As of 02/02/2012 10:30 AM   TAKE these medications         ferrous sulfate 325 (65 FE) MG tablet   Take 1 tablet (325 mg total) by mouth 2 (two) times daily with a meal.      ibuprofen 600 MG tablet   Commonly known as: ADVIL,MOTRIN   Take 1 tablet (600 mg total) by mouth every 6 (six) hours as needed for pain.      oxyCODONE-acetaminophen 5-325 MG per tablet   Commonly known as: PERCOCET/ROXICET   Take 1-2 tablets by mouth every 4 (four) hours as needed (moderate - severe pain).      prenatal multivitamin Tabs   Take 1 tablet by mouth daily.      senna-docusate 8.6-50 MG per tablet   Commonly known as: Senokot-S   Take 2 tablets by mouth at bedtime.          discussed change gauze as needed for 2-3 days and discontinue when stays dry [facility] handbook  Signed: Raji Glinski 02/02/2012, 10:30 AM

## 2012-02-03 ENCOUNTER — Telehealth: Payer: Self-pay | Admitting: Obstetrics and Gynecology

## 2012-02-03 NOTE — Telephone Encounter (Signed)
Triage/epic 

## 2012-02-03 NOTE — Telephone Encounter (Signed)
TC to pt. States was trying to breast feed but baby was not latching on well. Switched to formula.  Wants to retry.  Delivered 01/30/12.  States left breast is much bigger. Only able to express small amount of milk.  Discussed using heat prior to feeding, cabbage leaves. To continue to put baby to breast. Given # to lactation consultant at [facility]. Has appt with pediatrician today.  To ask for assistance there.  Reviewed S&S of mastitis. Pt denies any at present time but to call if occur or has any other concerns. Pt verbalizes comprehension.  Congrats given.

## 2012-03-06 ENCOUNTER — Telehealth: Payer: Self-pay | Admitting: Obstetrics and Gynecology

## 2012-03-06 NOTE — Telephone Encounter (Signed)
Lm on vm tcb rgd msg 

## 2012-03-06 NOTE — Telephone Encounter (Signed)
Spoke with pt rgd msg pt wants to know if the bleeding she having normal advised pt the bleeding she's having is her cycle pt voice understanding

## 2012-03-06 NOTE — Telephone Encounter (Signed)
nd pt 

## 2012-03-07 ENCOUNTER — Encounter: Payer: Self-pay | Admitting: Obstetrics and Gynecology

## 2012-03-07 ENCOUNTER — Ambulatory Visit (INDEPENDENT_AMBULATORY_CARE_PROVIDER_SITE_OTHER): Payer: Medicaid Other | Admitting: Obstetrics and Gynecology

## 2012-03-07 NOTE — Patient Instructions (Signed)
Patient Education Materials to be provided at check out (*indicates is located in accordion folder):  *Nexplanon 

## 2012-03-07 NOTE — Progress Notes (Signed)
Date of delivery: 01/30/12 Female Name: [facility] Vaginal delivery:no Cesarean section:yes Tubal ligation:no GDM:no Breast Feeding:no Bottle Feeding:yes Post-Partum Blues:no Abnormal pap:no Normal GU function: yes Normal GI function:yes Returning to work:no EPDS: 3 BP 114/74  Ht 5\' 3"  (1.6 m)  Wt 244 lb (110.678 kg)  BMI 43.22 kg/m2  LMP 03/06/2012 @name  @age . female who presents for a postpartum visit.  ABD: soft nontender GU: vulva normal no masses seen.  Vagina normal in appearance.  Cervix is parous and NT.  Uterus normal size.  No adnexal tenderness bilaterally or fullness EXT: no CCEB Pt plans nexplanon May resume intercourse , exercise and normal activity RT 1 week

## 2012-03-16 ENCOUNTER — Ambulatory Visit: Payer: Medicaid Other | Admitting: Obstetrics and Gynecology

## 2012-03-20 ENCOUNTER — Encounter: Payer: Medicaid Other | Admitting: Obstetrics and Gynecology

## 2012-03-21 ENCOUNTER — Ambulatory Visit (INDEPENDENT_AMBULATORY_CARE_PROVIDER_SITE_OTHER): Payer: Medicaid Other | Admitting: Obstetrics and Gynecology

## 2012-03-21 ENCOUNTER — Encounter: Payer: Self-pay | Admitting: Obstetrics and Gynecology

## 2012-03-21 VITALS — BP 118/60 | Wt 249.0 lb

## 2012-03-21 DIAGNOSIS — Z30017 Encounter for initial prescription of implantable subdermal contraceptive: Secondary | ICD-10-CM

## 2012-03-21 DIAGNOSIS — IMO0001 Reserved for inherently not codable concepts without codable children: Secondary | ICD-10-CM

## 2012-03-21 MED ORDER — ETONOGESTREL 68 MG ~~LOC~~ IMPL: 68.0000 mg | DRUG_IMPLANT | Freq: Once | SUBCUTANEOUS | Status: DC

## 2012-03-21 MED ORDER — ETONOGESTREL 68 MG ~~LOC~~ IMPL
68.0000 mg | DRUG_IMPLANT | Freq: Once | SUBCUTANEOUS | Status: AC
  Administered 2012-03-21: 68 mg via SUBCUTANEOUS

## 2012-03-21 NOTE — Progress Notes (Signed)
LMP: 03/06/12 INSERTION DATE: 03/21/12 REMOVAL DATE: 03/22/15 INSERTION ARM: left PALPATED AFTER INSERT: yes IS PT SWITCHING FROM HORMONAL BC: no LOT #: [id number] EXP: 04/2014 Nexplanon insertion Pts upper arm prepped with betadine.  1cc of 1% buffered lidocaine used for anesteshia.  Nexplanon inserted per protocol.  Pt tolerated the procedure.  Hemostasis noted at the end of procedure.  Nexplanon palpated by myself and the patient.

## 2012-03-26 ENCOUNTER — Other Ambulatory Visit: Payer: Self-pay | Admitting: Obstetrics and Gynecology

## 2012-03-27 NOTE — Telephone Encounter (Signed)
Pt called regarding msg below.  Pt has [employer] and advised to get authorization from her PCP to see if we are able to see her, pt to call back to let us know.  Pt states her PCP says they will need to evaluate her first, advised if indicated that we need to see her to call back for an appointment, pt voices understanding.

## 2012-05-28 ENCOUNTER — Telehealth: Payer: Self-pay | Admitting: Obstetrics and Gynecology

## 2012-05-28 NOTE — Telephone Encounter (Signed)
Spoke to pt who is concerned about her irregular menses after Nexplanon insertion. I explained that this is to be expected. It is normal to either have irreg bleeding, heavy bleeding and  No cycle at all after Nexplanon insertion, it all depends on the person. She states she can still feel Nexplanon in her arm, although she has not been in for f/u . I rec. Just for her peace of mind that she do an otc upt, but reassured her that what she is experiencing is normal. Levin Erp

## 2012-11-02 ENCOUNTER — Emergency Department (HOSPITAL_COMMUNITY)
Admission: EM | Admit: 2012-11-02 | Discharge: 2012-11-02 | Disposition: A | Discharge status: Home / Self Care | Payer: Medicaid Other | Attending: Emergency Medicine | Admitting: Emergency Medicine

## 2012-11-02 ENCOUNTER — Encounter (HOSPITAL_COMMUNITY): Payer: Self-pay | Admitting: Adult Health

## 2012-11-02 DIAGNOSIS — Z8719 Personal history of other diseases of the digestive system: Secondary | ICD-10-CM | POA: Insufficient documentation

## 2012-11-02 DIAGNOSIS — R498 Other voice and resonance disorders: Secondary | ICD-10-CM | POA: Insufficient documentation

## 2012-11-02 DIAGNOSIS — Z87891 Personal history of nicotine dependence: Secondary | ICD-10-CM | POA: Insufficient documentation

## 2012-11-02 DIAGNOSIS — R5381 Other malaise: Secondary | ICD-10-CM | POA: Insufficient documentation

## 2012-11-02 DIAGNOSIS — Z79899 Other long term (current) drug therapy: Secondary | ICD-10-CM | POA: Insufficient documentation

## 2012-11-02 DIAGNOSIS — R1310 Dysphagia, unspecified: Secondary | ICD-10-CM | POA: Insufficient documentation

## 2012-11-02 DIAGNOSIS — R599 Enlarged lymph nodes, unspecified: Secondary | ICD-10-CM | POA: Insufficient documentation

## 2012-11-02 DIAGNOSIS — R51 Headache: Secondary | ICD-10-CM | POA: Insufficient documentation

## 2012-11-02 DIAGNOSIS — J020 Streptococcal pharyngitis: Secondary | ICD-10-CM | POA: Insufficient documentation

## 2012-11-02 DIAGNOSIS — Z8619 Personal history of other infectious and parasitic diseases: Secondary | ICD-10-CM | POA: Insufficient documentation

## 2012-11-02 DIAGNOSIS — Z658 Other specified problems related to psychosocial circumstances: Secondary | ICD-10-CM | POA: Insufficient documentation

## 2012-11-02 DIAGNOSIS — Z8742 Personal history of other diseases of the female genital tract: Secondary | ICD-10-CM | POA: Insufficient documentation

## 2012-11-02 MED ORDER — PENICILLIN G BENZATHINE 1200000 UNIT/2ML IM SUSP
1.2000 10*6.[IU] | Freq: Once | INTRAMUSCULAR | Status: AC
  Administered 2012-11-02: 1.2 10*6.[IU] via INTRAMUSCULAR
  Filled 2012-11-02: qty 2

## 2012-11-02 MED ORDER — IBUPROFEN 400 MG PO TABS
800.0000 mg | ORAL_TABLET | Freq: Once | ORAL | Status: AC
  Administered 2012-11-02: 800 mg via ORAL
  Filled 2012-11-02: qty 2

## 2012-11-02 MED ORDER — GI COCKTAIL ~~LOC~~
30.0000 mL | Freq: Once | ORAL | Status: AC
  Administered 2012-11-02: 30 mL via ORAL
  Filled 2012-11-02: qty 30

## 2012-11-02 NOTE — ED Notes (Signed)
Pt discharged.Vital signs stable and GCS 15 

## 2012-11-02 NOTE — ED Provider Notes (Signed)
History     CSN: [id number]  Arrival date & time 11/02/12  0019   None     Chief Complaint  Patient presents with  . Sore Throat    (Consider location/radiation/quality/duration/timing/severity/associated sxs/prior treatment) HPI Comments: Patient presents for sore throat with onset 2 days ago that has been gradually worsening. Symptoms aggravated with swallowing, and eating/drinking. Patient admits to associated fatigue and lymphadenopathy. She has been trying a salt water gargle with only mild relief. Denies sick contacts, SOB, drooling, choking, and inability to swallow her secretions.  Patient is a 27 y.o. female presenting with pharyngitis. The history is provided by the patient. No language interpreter was used.  Sore Throat This is a new problem. The current episode started in the past 7 days. The problem occurs constantly. The problem has been gradually worsening. Associated symptoms include fatigue, headaches, a sore throat and swollen glands. Pertinent negatives include no abdominal pain, chest pain, congestion, coughing, fever, nausea, neck pain, numbness, rash, visual change or vomiting. The symptoms are aggravated by swallowing, eating and drinking. Treatments tried: salt water gargle. The treatment provided mild relief.    Past Medical History  Diagnosis Date  . Headache     3X WEEK  . 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 GERD (gastroesophageal reflux disease) 2012    diet controlled - no meds  . Irregular periods/menstrual cycles 01/30/2012  . Poor social situation 01/30/2012  . Late prenatal care 01/30/2012    Past Surgical History  Procedure Laterality Date  . Cesarean section  2009  . Tonsillectomy  AGE 19  . Tonsillectomy and adenoidectomy  AGEB 18  . Cesarean section  01/30/2012    Procedure: CESAREAN SECTION;  Surgeon: Michael Litter, MD;  Location: [facility];  Service: Gynecology;  Laterality: N/A;  Repeat C/S    Family History  Problem Relation Age of Onset  .  Asthma Mother   . Hypertension Mother   . Early death Mother     AGE 80  . Cancer Maternal Grandmother     History  Substance Use Topics  . Smoking status: Former Smoker -- 0.50 packs/day for 6 years    Types: Cigarettes    Quit date: 10/10/2011  . Smokeless tobacco: Never Used  . Alcohol Use: 1.5 oz/week    3 drink(s) per week     Comment: LIQUOR;  LAST ALCOLHOL 10/2011    OB History   Grav Para Term Preterm Abortions TAB SAB Ect Mult Living   2 2 2       2      Obstetric Comments   NO PRENATAL CARE WITH FIRST PREGNANCY. 36-38 WKS  GEATATION.  "DID NOT KNOW SHE WAS PREGNANT UNTIL DELIVERY."      Review of Systems  Constitutional: Positive for fatigue. Negative for fever.  HENT: Positive for sore throat and voice change. Negative for congestion, rhinorrhea, drooling, mouth sores, trouble swallowing, neck pain, neck stiffness and sinus pressure.        +dysphagia  Respiratory: Negative for cough and shortness of breath.   Cardiovascular: Negative for chest pain.  Gastrointestinal: Negative for nausea, vomiting and abdominal pain.  Skin: Negative for rash.  Neurological: Positive for headaches. Negative for numbness.    Allergies  Review of patient's allergies indicates no known allergies.  Home Medications   Current Outpatient Rx  Name  Route  Sig  Dispense  Refill  . ferrous sulfate 325 (65 FE) MG tablet   Oral   Take  1 tablet (325 mg total) by mouth 2 (two) times daily with a meal.   60 tablet   1   . Prenatal Vit-Fe Fumarate-FA (PRENATAL MULTIVITAMIN) TABS   Oral   Take 1 tablet by mouth daily.         Marland Kitchen senna-docusate (SENOKOT-S) 8.6-50 MG per tablet   Oral   Take 2 tablets by mouth at bedtime.   60 tablet   1     BP 127/61  Pulse 101  Temp(Src) 99.8 F (37.7 C) (Oral)  Resp 18  SpO2 96%  Physical Exam  Nursing note and vitals reviewed. Constitutional: She is oriented to person, place, and time. She appears well-developed and well-nourished.  No distress.  HENT:  Head: Normocephalic and atraumatic. No trismus in the jaw.  Right Ear: Tympanic membrane, external ear and ear canal normal. No mastoid tenderness.  Left Ear: Tympanic membrane, external ear and ear canal normal. No mastoid tenderness.  Nose: Nose normal.  Mouth/Throat: Uvula is midline and mucous membranes are normal. No oral lesions. No dental abscesses or edematous. Oropharyngeal exudate, posterior oropharyngeal edema and posterior oropharyngeal erythema present. No tonsillar abscesses.  Eyes: Conjunctivae and EOM are normal. Pupils are equal, round, and reactive to light. No scleral icterus.  Neck: Normal range of motion. Neck supple.  Cardiovascular: Normal rate, regular rhythm, normal heart sounds and intact distal pulses.   Not tachycardic as noted in triage  Pulmonary/Chest: Effort normal and breath sounds normal. No respiratory distress. She has no wheezes. She has no rales.  Abdominal: Soft. She exhibits no distension. There is no tenderness.  Musculoskeletal: Normal range of motion.  Lymphadenopathy:    She has cervical adenopathy.  Neurological: She is alert and oriented to person, place, and time.  Skin: Skin is warm and dry. No rash noted. She is not diaphoretic. No erythema.  Psychiatric: She has a normal mood and affect. Her behavior is normal.    ED Course  Procedures (including critical care time)  Labs Reviewed  RAPID STREP SCREEN - Abnormal; Notable for the following:    Streptococcus, Group A Screen (Direct) POSITIVE (*)    All other components within normal limits   No results found.   1. Strep pharyngitis     MDM  Sore throat with +strep screen - strep pharyngitis. On physical exam, uvula mildine without edema; patient admits to having her tonsils removed 9 years ago. No evidence to suggest tonsillar abscess. Patient swallowing liquids and secretions without difficulty. No SOB, drooling, or hypoxia. Patient tx in ED with IM Bicillin and  ibuprofen; patient refuses tx with PO percocet or norco. GI cocktail given to gargle to help improve sore throat symptoms. Patient nontoxic appearing with stable VS. Appropriate for d/c with PCP follow up to ensure resolution of symptoms. Chloraseptic spray, ibuprofen, magic mouthwash, and salt water gargle recommended for symptoms. Indications for ED return discussed. Patient states comfort and understanding with this d/c plan with no unaddressed concerns.   Filed Vitals:   11/02/12 0029 11/02/12 0234  BP: 127/61 124/76  Pulse: 101 96  Temp: 99.8 F (37.7 C) 99.7 F (37.6 C)  TempSrc: Oral Oral  Resp: 18 16  SpO2: 96% 99%           Antony Madura, PA-C 11/05/12 [phone]

## 2012-11-02 NOTE — ED Notes (Signed)
Presents with sore throat and redness to throat that began 2 days ago, associated with headache and pain with swallowing. Throat is red and tonsils are swollen.

## 2012-11-05 NOTE — ED Provider Notes (Signed)
Medical screening examination/treatment/procedure(s) were performed by non-physician practitioner and as supervising physician I was immediately available for consultation/collaboration.   Hazeline Charnley L Cleveland Paiz, MD 11/05/12 1919 

## 2013-09-05 ENCOUNTER — Emergency Department (HOSPITAL_COMMUNITY)
Admission: EM | Admit: 2013-09-05 | Discharge: 2013-09-06 | Disposition: A | Discharge status: Home / Self Care | Payer: Medicaid Other | Attending: Emergency Medicine | Admitting: Emergency Medicine

## 2013-09-05 ENCOUNTER — Encounter (HOSPITAL_COMMUNITY): Payer: Self-pay | Admitting: Emergency Medicine

## 2013-09-05 DIAGNOSIS — Z8619 Personal history of other infectious and parasitic diseases: Secondary | ICD-10-CM | POA: Insufficient documentation

## 2013-09-05 DIAGNOSIS — K8020 Calculus of gallbladder without cholecystitis without obstruction: Secondary | ICD-10-CM | POA: Insufficient documentation

## 2013-09-05 DIAGNOSIS — R51 Headache: Secondary | ICD-10-CM | POA: Insufficient documentation

## 2013-09-05 DIAGNOSIS — Z87891 Personal history of nicotine dependence: Secondary | ICD-10-CM | POA: Insufficient documentation

## 2013-09-05 DIAGNOSIS — O0930 Supervision of pregnancy with insufficient antenatal care, unspecified trimester: Secondary | ICD-10-CM | POA: Insufficient documentation

## 2013-09-05 DIAGNOSIS — N926 Irregular menstruation, unspecified: Secondary | ICD-10-CM | POA: Insufficient documentation

## 2013-09-05 DIAGNOSIS — Z8719 Personal history of other diseases of the digestive system: Secondary | ICD-10-CM | POA: Insufficient documentation

## 2013-09-05 DIAGNOSIS — Z658 Other specified problems related to psychosocial circumstances: Secondary | ICD-10-CM | POA: Insufficient documentation

## 2013-09-05 DIAGNOSIS — R1031 Right lower quadrant pain: Secondary | ICD-10-CM | POA: Insufficient documentation

## 2013-09-05 DIAGNOSIS — R197 Diarrhea, unspecified: Secondary | ICD-10-CM | POA: Insufficient documentation

## 2013-09-05 LAB — COMPREHENSIVE METABOLIC PANEL
ALBUMIN: 3.7 g/dL (ref 3.5–5.2)
ALK PHOS: 60 U/L (ref 39–117)
ALT: 14 U/L (ref 0–35)
AST: 21 U/L (ref 0–37)
BILIRUBIN TOTAL: 0.8 mg/dL (ref 0.3–1.2)
BUN: 9 mg/dL (ref 6–23)
CHLORIDE: 99 meq/L (ref 96–112)
CO2: 23 meq/L (ref 19–32)
CREATININE: 0.6 mg/dL (ref 0.50–1.10)
Calcium: 9.2 mg/dL (ref 8.4–10.5)
GFR calc Af Amer: >90 mL/min (ref >90)
Glucose, Bld: 95 mg/dL (ref 70–99)
POTASSIUM: 3.9 meq/L (ref 3.7–5.3)
Sodium: 137 mEq/L (ref 137–147)
Total Protein: 7.7 g/dL (ref 6.0–8.3)

## 2013-09-05 LAB — CBC WITH DIFFERENTIAL/PLATELET
BASOS ABS: 0 10*3/uL (ref 0.0–0.1)
BASOS PCT: 0 % (ref 0–1)
Eosinophils Absolute: 0.1 10*3/uL (ref 0.0–0.7)
Eosinophils Relative: 1 % (ref 0–5)
HEMATOCRIT: 39.8 % (ref 36.0–46.0)
HEMOGLOBIN: 14.3 g/dL (ref 12.0–15.0)
LYMPHS PCT: 16 % (ref 12–46)
Lymphs Abs: 1.9 10*3/uL (ref 0.7–4.0)
MCH: 28.5 pg (ref 26.0–34.0)
MCHC: 35.9 g/dL (ref 30.0–36.0)
MCV: 79.3 fL (ref 78.0–100.0)
MONO ABS: 0.8 10*3/uL (ref 0.1–1.0)
MONOS PCT: 7 % (ref 3–12)
NEUTROS ABS: 9.4 10*3/uL — AB (ref 1.7–7.7)
NEUTROS PCT: 77 % (ref 43–77)
Platelets: 222 10*3/uL (ref 150–400)
RBC: 5.02 MIL/uL (ref 3.87–5.11)
RDW: 13.2 % (ref 11.5–15.5)
WBC: 12.2 10*3/uL — AB (ref 4.0–10.5)

## 2013-09-05 MED ORDER — ONDANSETRON 4 MG PO TBDP: 8.0000 mg | ORAL_TABLET | Freq: Once | ORAL | Status: DC

## 2013-09-05 MED ORDER — ONDANSETRON HCL 4 MG/2ML IJ SOLN
4.0000 mg | Freq: Once | INTRAMUSCULAR | Status: AC
  Administered 2013-09-06: 4 mg via INTRAVENOUS
  Filled 2013-09-05: qty 2

## 2013-09-05 NOTE — ED Notes (Signed)
X 2 days of n/v/d. Hx. Of "the GI Bug."

## 2013-09-06 ENCOUNTER — Emergency Department (HOSPITAL_COMMUNITY): Payer: Medicaid Other

## 2013-09-06 LAB — URINE MICROSCOPIC-ADD ON

## 2013-09-06 LAB — URINALYSIS, ROUTINE W REFLEX MICROSCOPIC
GLUCOSE, UA: NEGATIVE mg/dL
KETONES UR: 15 mg/dL — AB
Nitrite: NEGATIVE
PH: 5.5 (ref 5.0–8.0)
PROTEIN: NEGATIVE mg/dL
Specific Gravity, Urine: >1.03 — ABNORMAL HIGH (ref 1.005–1.030)
Urobilinogen, UA: 1 mg/dL (ref 0.0–1.0)

## 2013-09-06 LAB — LIPASE, BLOOD: Lipase: 21 U/L (ref 11–59)

## 2013-09-06 LAB — POC URINE PREG, ED: Preg Test, Ur: NEGATIVE

## 2013-09-06 MED ORDER — ONDANSETRON HCL 4 MG/2ML IJ SOLN
4.0000 mg | Freq: Once | INTRAMUSCULAR | Status: AC
  Administered 2013-09-06: 4 mg via INTRAVENOUS
  Filled 2013-09-06: qty 2

## 2013-09-06 MED ORDER — ONDANSETRON HCL 4 MG PO TABS: 4.0000 mg | ORAL_TABLET | Freq: Four times a day (QID) | ORAL | Status: DC

## 2013-09-06 MED ORDER — MORPHINE SULFATE 4 MG/ML IJ SOLN
4.0000 mg | Freq: Once | INTRAMUSCULAR | Status: AC
  Administered 2013-09-06: 4 mg via INTRAVENOUS
  Filled 2013-09-06: qty 1

## 2013-09-06 MED ORDER — MORPHINE SULFATE 4 MG/ML IJ SOLN: 4.0000 mg | Freq: Once | INTRAMUSCULAR | Status: DC

## 2013-09-06 MED ORDER — OXYCODONE-ACETAMINOPHEN 5-325 MG PO TABS: 1.0000 | ORAL_TABLET | ORAL | Status: DC | PRN

## 2013-09-06 NOTE — ED Notes (Signed)
Pt has a family member picking her up.  

## 2013-09-06 NOTE — ED Provider Notes (Signed)
Medical screening examination/treatment/procedure(s) were performed by non-physician practitioner and as supervising physician I was immediately available for consultation/collaboration.   EKG Interpretation None       Ashley NielsenBrian Rosalynd Mcwright, MD 09/06/13 [phone]

## 2013-09-06 NOTE — ED Notes (Signed)
Patient transported to Ultrasound 

## 2013-09-06 NOTE — ED Notes (Signed)
Duplicate order discontinued.  

## 2013-09-06 NOTE — ED Provider Notes (Signed)
CSN: [id number]     Arrival date & time 09/05/13  2256 History   First MD Initiated Contact with Patient 09/05/13 2327     Chief Complaint  Patient presents with  . Nausea  . Emesis  . Diarrhea     (Consider location/radiation/quality/duration/timing/severity/associated sxs/prior Treatment) Patient is a 28 y.o. female presenting with vomiting and diarrhea. The history is provided by the patient. No language interpreter was used.  Emesis Associated symptoms: abdominal pain and diarrhea   Associated symptoms: no chills   Associated symptoms comment:  Nausea, vomiting and diarrhea for the past several days without fever. She has not had any vomiting since yesterday. She presents with complaint of increased abdominal pain that was generalized and is now localized to the RUQ. She has persistent nausea.  Diarrhea Associated symptoms: abdominal pain and vomiting   Associated symptoms: no chills and no fever     Past Medical History  Diagnosis Date  . Headache(784.0)     3X WEEK  . 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 GERD (gastroesophageal reflux disease) 2012    diet controlled - no meds  . Irregular periods/menstrual cycles 01/30/2012  . Poor social situation 01/30/2012  . Late prenatal care 01/30/2012   Past Surgical History  Procedure Laterality Date  . Cesarean section  2009  . Tonsillectomy  AGE 7  . Tonsillectomy and adenoidectomy  AGEB 18  . Cesarean section  01/30/2012    Procedure: CESAREAN SECTION;  Surgeon: Michael Litter, MD;  Location: [facility];  Service: Gynecology;  Laterality: N/A;  Repeat C/S   Family History  Problem Relation Age of Onset  . Asthma Mother   . Hypertension Mother   . Early death Mother     AGE 65  . Cancer Maternal Grandmother    History  Substance Use Topics  . Smoking status: Former Smoker -- 0.50 packs/day for 6 years    Types: Cigarettes    Quit date: 10/10/2011  . Smokeless tobacco: Never Used  . Alcohol Use: 1.5 oz/week    3 drink(s) per  week     Comment: LIQUOR;  LAST ALCOLHOL 10/2011   OB History   Grav Para Term Preterm Abortions TAB SAB Ect Mult Living   2 2 2       2      Obstetric Comments   NO PRENATAL CARE WITH FIRST PREGNANCY. 36-38 WKS  GEATATION.  "DID NOT KNOW SHE WAS PREGNANT UNTIL DELIVERY."     Review of Systems  Constitutional: Negative for fever and chills.  Respiratory: Negative.   Cardiovascular: Negative.   Gastrointestinal: Positive for vomiting, abdominal pain and diarrhea.  Genitourinary: Negative for dysuria.  Musculoskeletal: Negative.   Skin: Negative.   Neurological: Negative.       Allergies  Review of patient's allergies indicates no known allergies.  Home Medications  No current outpatient prescriptions on file. BP 103/57  Pulse 90  Temp(Src) 100.4 F (38 C) (Oral)  Resp 20  SpO2 96% Physical Exam  Constitutional: She is oriented to person, place, and time. She appears well-developed and well-nourished.  HENT:  Head: Normocephalic.  Neck: Normal range of motion. Neck supple.  Cardiovascular: Normal rate and regular rhythm.   Pulmonary/Chest: Effort normal and breath sounds normal.  Abdominal: Soft. Bowel sounds are normal. She exhibits no mass. There is tenderness. There is no rebound and no guarding.  RUQ tenderness.   Musculoskeletal: Normal range of motion.  Neurological: She is alert and oriented to  person, place, and time.  Skin: Skin is warm and dry. No rash noted.  Psychiatric: She has a normal mood and affect.    ED Course  Procedures (including critical care time) Labs Review Labs Reviewed  CBC WITH DIFFERENTIAL - Abnormal; Notable for the following:    WBC 12.2 (*)    Neutro Abs 9.4 (*)    All other components within normal limits  URINALYSIS, ROUTINE W REFLEX MICROSCOPIC - Abnormal; Notable for the following:    Specific Gravity, Urine >1.030 (*)    Hgb urine dipstick LARGE (*)    Bilirubin Urine SMALL (*)    Ketones, ur 15 (*)    Leukocytes, UA  SMALL (*)    All other components within normal limits  URINE MICROSCOPIC-ADD ON - Abnormal; Notable for the following:    Squamous Epithelial / LPF MANY (*)    Bacteria, UA MANY (*)    All other components within normal limits  COMPREHENSIVE METABOLIC PANEL  LIPASE, BLOOD  POC URINE PREG, ED   Imaging Review [place] Abdomen Complete  09/06/2013   CLINICAL DATA:  Nausea and vomiting.  Diarrhea.  EXAM: ULTRASOUND ABDOMEN COMPLETE  COMPARISON:  11/02/2008 CT.  FINDINGS: Gallbladder:  Multiple mobile gallstones measuring up to 1.5 cm. No pericholecystic fluid or gallbladder wall thickening. Per ultrasound technologist, patient was not tender over this region during scanning.  Common bile duct:  Diameter: 5.5 mm.  Liver:  No focal lesion identified. Within normal limits in parenchymal echogenicity.  IVC:  No abnormality visualized.  Pancreas:  Poorly delineated secondary to bowel gas.  Spleen:  Size and appearance within normal limits.  Right Kidney:  Length: 12.5 cm. Echogenicity within normal limits. No mass or hydronephrosis visualized.  Left Kidney:  Length: 12.9 cm. Echogenicity within normal limits. No mass or hydronephrosis visualized.  Abdominal aorta:  No aneurysm visualized.  Other findings:  None.  IMPRESSION: Multiple mobile gallstones measuring up to 1.5 cm. No pericholecystic fluid or gallbladder wall thickening. Per ultrasound technologist, patient was not tender over this region during scanning.  Pancreas poorly delineated secondary to overlying bowel gas.   Electronically Signed   By: Bridgett LarssonSteve  Olson M.D.   On: 09/06/2013 02:56     EKG Interpretation None      MDM   Final diagnoses:  None    1. Cholelithiasis  Blood studies show no sign of infection or inflammation. Ultrasound shows gall stones without evidence of acute cholecystitis. She is in NAD, pain improved with medications. Stable for discharge with surgical referral for elective cholecystectomy.    Arnoldo HookerShari A Eryx Zane,  PA-C 09/06/13 0320

## 2013-09-06 NOTE — Discharge Instructions (Signed)
Cholelithiasis °Cholelithiasis (also called gallstones) is a form of gallbladder disease in which gallstones form in your gallbladder. The gallbladder is an organ that stores bile made in the liver, which helps digest fats. Gallstones begin as small crystals and slowly grow into stones. Gallstone pain occurs when the gallbladder spasms and a gallstone is blocking the duct. Pain can also occur when a stone passes out of the duct.  °RISK FACTORS °· Being female.   °· Having multiple pregnancies. Health care providers sometimes advise removing diseased gallbladders before future pregnancies.   °· Being obese. °· Eating a diet heavy in fried foods and fat.   °· Being older than 60 years and increasing age.   °· Prolonged use of medicines containing female hormones.   °· Having diabetes mellitus.   °· Rapidly losing weight.   °· Having a family history of gallstones (heredity).   °SYMPTOMS °· Nausea.   °· Vomiting. °· Abdominal pain.   °· Yellowing of the skin (jaundice).   °· Sudden pain. It may persist from several minutes to several hours. °· Fever.   °· Tenderness to the touch.  °In some cases, when gallstones do not move into the bile duct, people have no pain or symptoms. These are called "silent" gallstones.  °TREATMENT °Silent gallstones do not need treatment. In severe cases, emergency surgery may be required. Options for treatment include: °· Surgery to remove the gallbladder. This is the most common treatment. °· Medicines. These do not always work and may take 6 12 months or more to work. °· Shock wave treatment (extracorporeal biliary lithotripsy). In this treatment an ultrasound machine sends shock waves to the gallbladder to break gallstones into smaller pieces that can pass into the intestines or be dissolved by medicine. °HOME CARE INSTRUCTIONS  °· Only take over-the-counter or prescription medicines for pain, discomfort, or fever as directed by your health care provider.   °· Follow a low-fat diet until  seen again by your health care provider. Fat causes the gallbladder to contract, which can result in pain.   °· Follow up with your health care provider as directed. Attacks are almost always recurrent and surgery is usually required for permanent treatment.   °SEEK IMMEDIATE MEDICAL CARE IF:  °· Your pain increases and is not controlled by medicines.   °· You have a fever or persistent symptoms for more than 2 3 days.   °· You have a fever and your symptoms suddenly get worse.   °· You have persistent nausea and vomiting.   °MAKE SURE YOU:  °· Understand these instructions. °· Will watch your condition. °· Will get help right away if you are not doing well or get worse. °Document Released: 06/16/2005 Document Revised: 02/20/2013 Document Reviewed: 12/12/2012 °[employer]® Patient Information ©2014 [employer]. ° °

## 2013-10-15 ENCOUNTER — Encounter (HOSPITAL_COMMUNITY): Payer: Self-pay | Admitting: Emergency Medicine

## 2013-10-15 ENCOUNTER — Inpatient Hospital Stay (HOSPITAL_COMMUNITY)
Admission: EM | Admit: 2013-10-15 | Discharge: 2013-10-17 | DRG: 418 | Disposition: A | Discharge status: Home / Self Care | Payer: Medicaid Other | Attending: Surgery | Admitting: Surgery

## 2013-10-15 ENCOUNTER — Emergency Department (HOSPITAL_COMMUNITY): Payer: Medicaid Other

## 2013-10-15 DIAGNOSIS — Z87891 Personal history of nicotine dependence: Secondary | ICD-10-CM

## 2013-10-15 DIAGNOSIS — K8020 Calculus of gallbladder without cholecystitis without obstruction: Secondary | ICD-10-CM

## 2013-10-15 DIAGNOSIS — Z6841 Body Mass Index (BMI) 40.0 and over, adult: Secondary | ICD-10-CM

## 2013-10-15 DIAGNOSIS — K8010 Calculus of gallbladder with chronic cholecystitis without obstruction: Principal | ICD-10-CM | POA: Diagnosis present

## 2013-10-15 DIAGNOSIS — R109 Unspecified abdominal pain: Secondary | ICD-10-CM

## 2013-10-15 DIAGNOSIS — R110 Nausea: Secondary | ICD-10-CM

## 2013-10-15 DIAGNOSIS — K219 Gastro-esophageal reflux disease without esophagitis: Secondary | ICD-10-CM | POA: Diagnosis present

## 2013-10-15 LAB — CBC WITH DIFFERENTIAL/PLATELET
Basophils Absolute: 0 10*3/uL (ref 0.0–0.1)
Basophils Relative: 0 % (ref 0–1)
EOS ABS: 0.4 10*3/uL (ref 0.0–0.7)
EOS PCT: 5 % (ref 0–5)
HCT: 38.7 % (ref 36.0–46.0)
HEMOGLOBIN: 13.2 g/dL (ref 12.0–15.0)
LYMPHS ABS: 3 10*3/uL (ref 0.7–4.0)
Lymphocytes Relative: 37 % (ref 12–46)
MCH: 27.9 pg (ref 26.0–34.0)
MCHC: 34.1 g/dL (ref 30.0–36.0)
MCV: 81.8 fL (ref 78.0–100.0)
Monocytes Absolute: 0.5 10*3/uL (ref 0.1–1.0)
Monocytes Relative: 6 % (ref 3–12)
Neutro Abs: 4.2 10*3/uL (ref 1.7–7.7)
Neutrophils Relative %: 52 % (ref 43–77)
Platelets: 205 10*3/uL (ref 150–400)
RBC: 4.73 MIL/uL (ref 3.87–5.11)
RDW: 13.4 % (ref 11.5–15.5)
WBC: 8.1 10*3/uL (ref 4.0–10.5)

## 2013-10-15 LAB — COMPREHENSIVE METABOLIC PANEL
ALT: 11 U/L (ref 0–35)
AST: 12 U/L (ref 0–37)
Albumin: 3.4 g/dL — ABNORMAL LOW (ref 3.5–5.2)
Alkaline Phosphatase: 60 U/L (ref 39–117)
BUN: 11 mg/dL (ref 6–23)
CALCIUM: 9.3 mg/dL (ref 8.4–10.5)
CO2: 24 mEq/L (ref 19–32)
Chloride: 103 mEq/L (ref 96–112)
Creatinine, Ser: 0.64 mg/dL (ref 0.50–1.10)
GLUCOSE: 84 mg/dL (ref 70–99)
POTASSIUM: 3.9 meq/L (ref 3.7–5.3)
Sodium: 140 mEq/L (ref 137–147)
TOTAL PROTEIN: 7.1 g/dL (ref 6.0–8.3)
Total Bilirubin: 0.4 mg/dL (ref 0.3–1.2)

## 2013-10-15 LAB — URINALYSIS, ROUTINE W REFLEX MICROSCOPIC
BILIRUBIN URINE: NEGATIVE
Glucose, UA: NEGATIVE mg/dL
Hgb urine dipstick: NEGATIVE
Ketones, ur: NEGATIVE mg/dL
NITRITE: NEGATIVE
PROTEIN: NEGATIVE mg/dL
SPECIFIC GRAVITY, URINE: 1.026 (ref 1.005–1.030)
UROBILINOGEN UA: 1 mg/dL (ref 0.0–1.0)
pH: 6 (ref 5.0–8.0)

## 2013-10-15 LAB — LIPASE, BLOOD: LIPASE: 33 U/L (ref 11–59)

## 2013-10-15 LAB — URINE MICROSCOPIC-ADD ON

## 2013-10-15 LAB — POC URINE PREG, ED: Preg Test, Ur: NEGATIVE

## 2013-10-15 MED ORDER — HYDROMORPHONE HCL PF 1 MG/ML IJ SOLN
1.0000 mg | Freq: Once | INTRAMUSCULAR | Status: AC
  Administered 2013-10-15: 1 mg via INTRAMUSCULAR
  Filled 2013-10-15: qty 1

## 2013-10-15 NOTE — ED Provider Notes (Signed)
CSN: [id number]     Arrival date & time 10/15/13  1747 History   First MD Initiated Contact with Patient 10/15/13 2028     Chief Complaint  Patient presents with  . Abdominal Pain     (Consider location/radiation/quality/duration/timing/severity/associated sxs/prior Treatment) Patient is a 28 y.o. female presenting with abdominal pain. The history is provided by the patient and medical records.  Abdominal Pain Associated symptoms: nausea    This is a 28 year old female with past medical history significant for GERD, gallstones, presenting to the ED for right upper quadrant abdominal pain. Patient was seen in ED last month, had ultrasound performed which showed gallstones but no inflammatory changes. Patient states she was feeling well for a few weeks the pain has since returned. Pain is again localized right upper quadrant, described as sharp in nature, worse with movement and better with lying still. Patient states she has associated nausea but denies vomiting. She does admit she has not been following the low fat diet was recommended for her, but she has been trying to eat less fried foods.  Patient states symptoms today are identical to her previous visit to the ED. She was referred to outpatient general surgery, but has not yet followed up.  She denies any fevers or chills.  Past Medical History  Diagnosis Date  . Headache(784.0)     3X WEEK  . 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 GERD (gastroesophageal reflux disease) 2012    diet controlled - no meds  . Irregular periods/menstrual cycles 01/30/2012  . Poor social situation 01/30/2012  . Late prenatal care 01/30/2012   Past Surgical History  Procedure Laterality Date  . Cesarean section  2009  . Tonsillectomy  AGE 61  . Tonsillectomy and adenoidectomy  AGEB 18  . Cesarean section  01/30/2012    Procedure: CESAREAN SECTION;  Surgeon: Michael LitterNaima A Dillard, MD;  Location: [facility];  Service: Gynecology;  Laterality: N/A;  Repeat C/S   Family  History  Problem Relation Age of Onset  . Asthma Mother   . Hypertension Mother   . Early death Mother     AGE 30  . Cancer Maternal Grandmother    History  Substance Use Topics  . Smoking status: Former Smoker -- 0.50 packs/day for 6 years    Types: Cigarettes    Quit date: 10/10/2011  . Smokeless tobacco: Never Used  . Alcohol Use: 1.5 oz/week    3 drink(s) per week     Comment: LIQUOR;  LAST ALCOLHOL 10/2011   OB History   Grav Para Term Preterm Abortions TAB SAB Ect Mult Living   2 2 2       2      Obstetric Comments   NO PRENATAL CARE WITH FIRST PREGNANCY. 36-38 WKS  GEATATION.  "DID NOT KNOW SHE WAS PREGNANT UNTIL DELIVERY."     Review of Systems  Gastrointestinal: Positive for nausea and abdominal pain.  All other systems reviewed and are negative.     Allergies  Review of patient's allergies indicates no known allergies.  Home Medications   Prior to Admission medications   Not on File   BP 122/84  Pulse 86  Temp(Src) 98.1 F (36.7 C) (Oral)  Resp 16  Ht 5\' 3"  (1.6 m)  Wt 240 lb (108.863 kg)  BMI 42.52 kg/m2  SpO2 98%  Physical Exam  Nursing note and vitals reviewed. Constitutional: She is oriented to person, place, and time. She appears well-developed and well-nourished. No distress.  HENT:  Head: Normocephalic and atraumatic.  Mouth/Throat: Oropharynx is clear and moist.  Eyes: Conjunctivae and EOM are normal. Pupils are equal, round, and reactive to light.  Neck: Normal range of motion.  Cardiovascular: Normal rate, regular rhythm and normal heart sounds.   Pulmonary/Chest: Effort normal and breath sounds normal. No respiratory distress. She has no wheezes.  Abdominal: Soft. Bowel sounds are normal. There is tenderness. There is positive Murphy's sign. There is no guarding.  Tenderness right upper quadrant with positive Murphy sign  Musculoskeletal: Normal range of motion.  Neurological: She is alert and oriented to person, place, and time.   Skin: Skin is warm and dry. She is not diaphoretic.  Psychiatric: She has a normal mood and affect.    ED Course  Procedures (including critical care time) Labs Review Labs Reviewed  COMPREHENSIVE METABOLIC PANEL - Abnormal; Notable for the following:    Albumin 3.4 (*)    All other components within normal limits  URINALYSIS, ROUTINE W REFLEX MICROSCOPIC - Abnormal; Notable for the following:    Leukocytes, UA TRACE (*)    All other components within normal limits  URINE MICROSCOPIC-ADD ON - Abnormal; Notable for the following:    Squamous Epithelial / LPF MANY (*)    All other components within normal limits  CBC WITH DIFFERENTIAL  LIPASE, BLOOD  POC URINE PREG, ED    Imaging Review Koreas Abdomen Complete  10/15/2013   CLINICAL DATA:  Right upper quadrant abdominal pain and known history of cholelithiasis.  EXAM: ULTRASOUND ABDOMEN COMPLETE  COMPARISON:  US ABDOMEN COMPLETE dated 09/06/2013; CT ABD W/CM dated 11/02/2008  FINDINGS: Gallbladder:  The gallbladder contains multiple shadowing calculi. Although the gallbladder wall does not appear thickened, the patient was focally tender over the gallbladder during examination. No surrounding fluid collections are identified.  Common bile duct:  Diameter: Normal caliber of 5 mm.  Liver:  No focal lesion identified. Within normal limits in parenchymal echogenicity.  IVC:  No abnormality visualized.  Pancreas:  Visualized portion unremarkable.  Spleen:  Size and appearance within normal limits.  Right Kidney:  Length: 11.8 cm. Echogenicity within normal limits. No mass or hydronephrosis visualized.  Left Kidney:  Length: 11.3 cm. Echogenicity within normal limits. No mass or hydronephrosis visualized.  Abdominal aorta:  No aneurysm visualized.  Other findings:  None.  IMPRESSION: Multiple echogenic calculi throughout the gallbladder lumen. A positive sonographic Murphy's sign was elicited during the examination. There is no evidence of biliary  obstruction.   Electronically Signed   By: Irish LackGlenn  Yamagata M.D.   On: 10/15/2013 21:52     EKG Interpretation None      MDM   Final diagnoses:  Gallstones  Abdominal pain  Nausea   Patient presenting to the ED for second gallbladder attack within the past month. Her labs today are reassuring. Her repeat ultrasound again shows gallstones with positive sonographic Murphy sign.  Consulted general surgery, Dr. Janee Mornhompson who will see pt in the ED and determine plan of care.  Dr. Janee Mornhompson has evaluated pt and agreed to admit, plan for cholecystectomy tomorrow.  Garlon HatchetLisa M Lyzette Reinhardt, PA-C 10/15/13 2344

## 2013-10-15 NOTE — H&P (Signed)
RAFEEF [name] is an 28 y.o. female.   Chief Complaint: Right upper quadrant abdominal pain, nausea HPI: Patient presented to the emergency department with two-day history of right upper quadrant abdominal pain associated with nausea. She has not had any emesis. She had a similar episode a month ago and was evaluated in the emergency department. She had diarrhea associated at that time. That has not returned. The pain, however, did come back. She underwent evaluation with ultrasound showing gallstones and positive sonographic Murphy's sign. Laboratory studies are within normal limits. I was asked to see her for admission.  Past Medical History  Diagnosis Date  . Headache(784.0)     3X WEEK  . 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 GERD (gastroesophageal reflux disease) 2012    diet controlled - no meds  . Irregular periods/menstrual cycles 01/30/2012  . Poor social situation 01/30/2012  . Late prenatal care 01/30/2012    Past Surgical History  Procedure Laterality Date  . Cesarean section  2009  . Tonsillectomy  AGE 33  . Tonsillectomy and adenoidectomy  AGEB 18  . Cesarean section  01/30/2012    Procedure: CESAREAN SECTION;  Surgeon: Betsy Coder, MD;  Location: [facility];  Service: Gynecology;  Laterality: N/A;  Repeat C/S    Family History  Problem Relation Age of Onset  . Asthma Mother   . Hypertension Mother   . Early death Mother     AGE 34  . Cancer Maternal Grandmother    Social History:  reports that she quit smoking about 2 years ago. Her smoking use included Cigarettes. She has a 3 pack-year smoking history. She has never used smokeless tobacco. She reports that she drinks about 1.5 ounces of alcohol per week. She reports that she does not use illicit drugs.  Allergies: No Known Allergies   (Not in a hospital admission)  Results for orders placed during the hospital encounter of 10/15/13 (from the past 48 hour(s))  CBC WITH DIFFERENTIAL     Status: None   Collection  Time    10/15/13  6:29 PM      Result Value Ref Range   WBC 8.1  4.0 - 10.5 K/uL   RBC 4.73  3.87 - 5.11 MIL/uL   Hemoglobin 13.2  12.0 - 15.0 g/dL   HCT 38.7  36.0 - 46.0 %   MCV 81.8  78.0 - 100.0 fL   MCH 27.9  26.0 - 34.0 pg   MCHC 34.1  30.0 - 36.0 g/dL   RDW 13.4  11.5 - 15.5 %   Platelets 205  150 - 400 K/uL   Neutrophils Relative % 52  43 - 77 %   Neutro Abs 4.2  1.7 - 7.7 K/uL   Lymphocytes Relative 37  12 - 46 %   Lymphs Abs 3.0  0.7 - 4.0 K/uL   Monocytes Relative 6  3 - 12 %   Monocytes Absolute 0.5  0.1 - 1.0 K/uL   Eosinophils Relative 5  0 - 5 %   Eosinophils Absolute 0.4  0.0 - 0.7 K/uL   Basophils Relative 0  0 - 1 %   Basophils Absolute 0.0  0.0 - 0.1 K/uL  COMPREHENSIVE METABOLIC PANEL     Status: Abnormal   Collection Time    10/15/13  6:29 PM      Result Value Ref Range   Sodium 140  137 - 147 mEq/L   Potassium 3.9  3.7 - 5.3 mEq/L  Chloride 103  96 - 112 mEq/L   CO2 24  19 - 32 mEq/L   Glucose, Bld 84  70 - 99 mg/dL   BUN 11  6 - 23 mg/dL   Creatinine, Ser 0.64  0.50 - 1.10 mg/dL   Calcium 9.3  8.4 - 10.5 mg/dL   Total Protein 7.1  6.0 - 8.3 g/dL   Albumin 3.4 (*) 3.5 - 5.2 g/dL   AST 12  0 - 37 U/L   ALT 11  0 - 35 U/L   Alkaline Phosphatase 60  39 - 117 U/L   Total Bilirubin 0.4  0.3 - 1.2 mg/dL   GFR calc non Af Amer >90  >90 mL/min   GFR calc Af Amer >90  >90 mL/min   Comment: (NOTE)     The eGFR has been calculated using the CKD EPI equation.     This calculation has not been validated in all clinical situations.     eGFR's persistently <90 mL/min signify possible Chronic Kidney     Disease.  LIPASE, BLOOD     Status: None   Collection Time    10/15/13  6:29 PM      Result Value Ref Range   Lipase 33  11 - 59 U/L  URINALYSIS, ROUTINE W REFLEX MICROSCOPIC     Status: Abnormal   Collection Time    10/15/13  6:38 PM      Result Value Ref Range   Color, Urine YELLOW  YELLOW   APPearance CLEAR  CLEAR   Specific Gravity, Urine 1.026   1.005 - 1.030   pH 6.0  5.0 - 8.0   Glucose, UA NEGATIVE  NEGATIVE mg/dL   Hgb urine dipstick NEGATIVE  NEGATIVE   Bilirubin Urine NEGATIVE  NEGATIVE   Ketones, ur NEGATIVE  NEGATIVE mg/dL   Protein, ur NEGATIVE  NEGATIVE mg/dL   Urobilinogen, UA 1.0  0.0 - 1.0 mg/dL   Nitrite NEGATIVE  NEGATIVE   Leukocytes, UA TRACE (*) NEGATIVE  URINE MICROSCOPIC-ADD ON     Status: Abnormal   Collection Time    10/15/13  6:38 PM      Result Value Ref Range   Squamous Epithelial / LPF MANY (*) RARE   WBC, UA 3-6  <3 WBC/hpf   RBC / HPF 0-2  <3 RBC/hpf   Bacteria, UA RARE  RARE  POC URINE PREG, ED     Status: None   Collection Time    10/15/13  6:49 PM      Result Value Ref Range   Preg Test, Ur NEGATIVE  NEGATIVE   Comment:            THE SENSITIVITY OF THIS     METHODOLOGY IS >24 mIU/mL   US Abdomen Complete  10/15/2013   CLINICAL DATA:  Right upper quadrant abdominal pain and known history of cholelithiasis.  EXAM: ULTRASOUND ABDOMEN COMPLETE  COMPARISON:  US ABDOMEN COMPLETE dated 09/06/2013; CT ABD W/CM dated 11/02/2008  FINDINGS: Gallbladder:  The gallbladder contains multiple shadowing calculi. Although the gallbladder wall does not appear thickened, the patient was focally tender over the gallbladder during examination. No surrounding fluid collections are identified.  Common bile duct:  Diameter: Normal caliber of 5 mm.  Liver:  No focal lesion identified. Within normal limits in parenchymal echogenicity.  IVC:  No abnormality visualized.  Pancreas:  Visualized portion unremarkable.  Spleen:  Size and appearance within normal limits.  Right Kidney:  Length: 11.8 cm. Echogenicity  within normal limits. No mass or hydronephrosis visualized.  Left Kidney:  Length: 11.3 cm. Echogenicity within normal limits. No mass or hydronephrosis visualized.  Abdominal aorta:  No aneurysm visualized.  Other findings:  None.  IMPRESSION: Multiple echogenic calculi throughout the gallbladder lumen. A positive  sonographic Murphy's sign was elicited during the examination. There is no evidence of biliary obstruction.   Electronically Signed   By: Aletta Edouard M.D.   On: 10/15/2013 21:52    Review of Systems  Constitutional: Negative for fever and chills.  HENT: Negative.   Eyes: Negative.   Respiratory: Negative.   Cardiovascular: Negative.   Gastrointestinal: Positive for nausea and abdominal pain. Negative for vomiting and diarrhea.  Genitourinary: Negative.   Musculoskeletal: Negative.   Skin: Negative.   Neurological: Negative.   Endo/Heme/Allergies: Negative.   Psychiatric/Behavioral: Negative.     Blood pressure 136/81, pulse 72, temperature 98.1 F (36.7 C), temperature source Oral, resp. rate 20, height $RemoveBe'5\' 3"'[id number]$  (1.6 m), weight 240 lb (108.863 kg), SpO2 99.00%, not currently breastfeeding. Physical Exam  Constitutional: She is oriented to person, place, and time. She appears well-developed and well-nourished. No distress.  HENT:  Head: Normocephalic and atraumatic.  Mouth/Throat: No oropharyngeal exudate.  Eyes: EOM are normal. Pupils are equal, round, and reactive to light. Right eye exhibits no discharge. Left eye exhibits no discharge. No scleral icterus.  Neck: Normal range of motion. Neck supple. No tracheal deviation present.  Cardiovascular: Normal rate, regular rhythm, normal heart sounds and intact distal pulses.   Respiratory: Effort normal and breath sounds normal. No stridor. No respiratory distress. She has no wheezes. She has no rales.  GI: Soft. She exhibits no distension. There is tenderness. There is no rebound and no guarding.  Tender right upper quadrant without guarding, hypoactive bowel sounds  Musculoskeletal: Normal range of motion. She exhibits no tenderness.  Neurological: She is alert and oriented to person, place, and time. She exhibits normal muscle tone.  Skin: Skin is warm and dry.  Psychiatric: She has a normal mood and affect.      Assessment/Plan Symptomatic cholelithiasis, possible early cholecystitis. Will admit and plan laparoscopic cholecystectomy, possibly tomorrow. I will put her on IV antibiotics and make her n.p.o. After 1 AM. I will discuss her case with Dr.Tsuei in the AM. Plan was discussed in detail with the patient. I asked to discuss the procedure risks and benefits of laparoscopic cholecystectomy with cholangiogram. She is agreeable.  Ashley [name] 10/15/2013, 11:20 PM

## 2013-10-15 NOTE — ED Notes (Signed)
Pt reports being seen here recently for same right side abd pain, was diagnosed with gallstones. Has nausea, no vomiting. Denies being referred to a surgeon.

## 2013-10-16 ENCOUNTER — Inpatient Hospital Stay (HOSPITAL_COMMUNITY): Payer: Medicaid Other | Admitting: Certified Registered"

## 2013-10-16 ENCOUNTER — Encounter (HOSPITAL_COMMUNITY): Payer: Medicaid Other | Admitting: Certified Registered"

## 2013-10-16 ENCOUNTER — Encounter (HOSPITAL_COMMUNITY): Payer: Self-pay | Admitting: *Deleted

## 2013-10-16 ENCOUNTER — Encounter (HOSPITAL_COMMUNITY): Admission: EM | Disposition: A | Discharge status: Home / Self Care | Payer: Self-pay | Source: Home / Self Care

## 2013-10-16 ENCOUNTER — Inpatient Hospital Stay (HOSPITAL_COMMUNITY): Payer: Medicaid Other

## 2013-10-16 DIAGNOSIS — K8010 Calculus of gallbladder with chronic cholecystitis without obstruction: Secondary | ICD-10-CM

## 2013-10-16 HISTORY — PX: CHOLECYSTECTOMY: SHX55

## 2013-10-16 LAB — COMPREHENSIVE METABOLIC PANEL
ALT: 10 U/L (ref 0–35)
AST: 12 U/L (ref 0–37)
Albumin: 3.1 g/dL — ABNORMAL LOW (ref 3.5–5.2)
Alkaline Phosphatase: 48 U/L (ref 39–117)
BUN: 8 mg/dL (ref 6–23)
CALCIUM: 8.6 mg/dL (ref 8.4–10.5)
CO2: 24 mEq/L (ref 19–32)
Chloride: 102 mEq/L (ref 96–112)
Creatinine, Ser: 0.68 mg/dL (ref 0.50–1.10)
GFR calc non Af Amer: >90 mL/min (ref >90)
GLUCOSE: 86 mg/dL (ref 70–99)
Potassium: 3.6 mEq/L — ABNORMAL LOW (ref 3.7–5.3)
SODIUM: 140 meq/L (ref 137–147)
TOTAL PROTEIN: 6.5 g/dL (ref 6.0–8.3)
Total Bilirubin: 0.7 mg/dL (ref 0.3–1.2)

## 2013-10-16 LAB — CBC
HEMATOCRIT: 37 % (ref 36.0–46.0)
HEMATOCRIT: 39.7 % (ref 36.0–46.0)
HEMOGLOBIN: 12.4 g/dL (ref 12.0–15.0)
HEMOGLOBIN: 13.8 g/dL (ref 12.0–15.0)
MCH: 27.6 pg (ref 26.0–34.0)
MCH: 28 pg (ref 26.0–34.0)
MCHC: 33.5 g/dL (ref 30.0–36.0)
MCHC: 34.8 g/dL (ref 30.0–36.0)
MCV: 82.4 fL (ref 78.0–100.0)
MCV: 80.7 fL (ref 78.0–100.0)
Platelets: 180 10*3/uL (ref 150–400)
Platelets: 219 10*3/uL (ref 150–400)
RBC: 4.49 MIL/uL (ref 3.87–5.11)
RBC: 4.92 MIL/uL (ref 3.87–5.11)
RDW: 13.5 % (ref 11.5–15.5)
RDW: 13.5 % (ref 11.5–15.5)
WBC: 7.5 10*3/uL (ref 4.0–10.5)
WBC: 12.4 10*3/uL — ABNORMAL HIGH (ref 4.0–10.5)

## 2013-10-16 LAB — CREATININE, SERUM
Creatinine, Ser: 0.6 mg/dL (ref 0.50–1.10)
GFR calc Af Amer: >90 mL/min (ref >90)

## 2013-10-16 LAB — SURGICAL PCR SCREEN
MRSA, PCR: NEGATIVE
STAPHYLOCOCCUS AUREUS: POSITIVE — AB

## 2013-10-16 SURGERY — LAPAROSCOPIC CHOLECYSTECTOMY WITH INTRAOPERATIVE CHOLANGIOGRAM
Anesthesia: General | Site: Abdomen

## 2013-10-16 MED ORDER — MIDAZOLAM HCL 2 MG/2ML IJ SOLN
INTRAMUSCULAR | Status: AC
  Filled 2013-10-16: qty 2

## 2013-10-16 MED ORDER — OXYCODONE HCL 5 MG PO TABS
5.0000 mg | ORAL_TABLET | Freq: Once | ORAL | Status: AC | PRN
  Administered 2013-10-16: 5 mg via ORAL

## 2013-10-16 MED ORDER — CEFAZOLIN SODIUM-DEXTROSE 2-3 GM-% IV SOLR
INTRAVENOUS | Status: DC | PRN
  Administered 2013-10-16: 2 g via INTRAVENOUS

## 2013-10-16 MED ORDER — PROPOFOL 10 MG/ML IV BOLUS
INTRAVENOUS | Status: DC | PRN
  Administered 2013-10-16: 80 mg via INTRAVENOUS
  Administered 2013-10-16: 60 mg via INTRAVENOUS
  Administered 2013-10-16: 200 mg via INTRAVENOUS

## 2013-10-16 MED ORDER — ACETAMINOPHEN 325 MG PO TABS
ORAL_TABLET | ORAL | Status: AC
  Filled 2013-10-16: qty 2

## 2013-10-16 MED ORDER — BUPIVACAINE-EPINEPHRINE 0.25% -1:200000 IJ SOLN
INTRAMUSCULAR | Status: DC | PRN
  Administered 2013-10-16: 11 mL

## 2013-10-16 MED ORDER — OXYCODONE HCL 5 MG PO TABS
ORAL_TABLET | ORAL | Status: AC
  Filled 2013-10-16: qty 1

## 2013-10-16 MED ORDER — FENTANYL CITRATE 0.05 MG/ML IJ SOLN
INTRAMUSCULAR | Status: AC
  Filled 2013-10-16: qty 2

## 2013-10-16 MED ORDER — FENTANYL CITRATE 0.05 MG/ML IJ SOLN
INTRAMUSCULAR | Status: DC | PRN
  Administered 2013-10-16: 150 ug via INTRAVENOUS
  Administered 2013-10-16: 100 ug via INTRAVENOUS

## 2013-10-16 MED ORDER — ROCURONIUM BROMIDE 100 MG/10ML IV SOLN
INTRAVENOUS | Status: DC | PRN
  Administered 2013-10-16: 50 mg via INTRAVENOUS

## 2013-10-16 MED ORDER — FENTANYL CITRATE 0.05 MG/ML IJ SOLN
INTRAMUSCULAR | Status: AC
  Filled 2013-10-16: qty 5

## 2013-10-16 MED ORDER — ONDANSETRON HCL 4 MG PO TABS: 4.0000 mg | ORAL_TABLET | Freq: Four times a day (QID) | ORAL | Status: DC | PRN

## 2013-10-16 MED ORDER — MIDAZOLAM HCL 5 MG/5ML IJ SOLN
INTRAMUSCULAR | Status: DC | PRN
  Administered 2013-10-16: 2 mg via INTRAVENOUS

## 2013-10-16 MED ORDER — SODIUM CHLORIDE 0.9 % IR SOLN
Status: DC | PRN
  Administered 2013-10-16: 1000 mL

## 2013-10-16 MED ORDER — KCL IN DEXTROSE-NACL 20-5-0.45 MEQ/L-%-% IV SOLN
INTRAVENOUS | Status: DC
Start: 2013-10-16 — End: 2013-10-17
  Administered 2013-10-16 – 2013-10-17 (×3): via INTRAVENOUS
  Filled 2013-10-16 (×6): qty 1000

## 2013-10-16 MED ORDER — LIDOCAINE HCL (CARDIAC) 20 MG/ML IV SOLN
INTRAVENOUS | Status: DC | PRN
  Administered 2013-10-16: 80 mg via INTRAVENOUS

## 2013-10-16 MED ORDER — PANTOPRAZOLE SODIUM 40 MG IV SOLR
40.0000 mg | Freq: Every day | INTRAVENOUS | Status: DC
  Administered 2013-10-16 (×2): 40 mg via INTRAVENOUS
  Filled 2013-10-16 (×4): qty 40

## 2013-10-16 MED ORDER — ONDANSETRON HCL 4 MG/2ML IJ SOLN: 4.0000 mg | Freq: Four times a day (QID) | INTRAMUSCULAR | Status: DC | PRN

## 2013-10-16 MED ORDER — GLYCOPYRROLATE 0.2 MG/ML IJ SOLN
INTRAMUSCULAR | Status: DC | PRN
  Administered 2013-10-16: 0.4 mg via INTRAVENOUS

## 2013-10-16 MED ORDER — ONDANSETRON HCL 4 MG/2ML IJ SOLN
INTRAMUSCULAR | Status: DC | PRN
  Administered 2013-10-16: 4 mg via INTRAVENOUS

## 2013-10-16 MED ORDER — SODIUM CHLORIDE 0.9 % IV SOLN
INTRAVENOUS | Status: DC | PRN
  Administered 2013-10-16: 11:00:00

## 2013-10-16 MED ORDER — OXYCODONE-ACETAMINOPHEN 5-325 MG PO TABS
1.0000 | ORAL_TABLET | ORAL | Status: DC | PRN
  Administered 2013-10-16 – 2013-10-17 (×4): 2 via ORAL
  Filled 2013-10-16 (×4): qty 2

## 2013-10-16 MED ORDER — DEXTROSE 5 % IV SOLN
1.0000 g | INTRAVENOUS | Status: DC
  Administered 2013-10-16: 1 g via INTRAVENOUS
  Filled 2013-10-16: qty 10

## 2013-10-16 MED ORDER — BUPIVACAINE-EPINEPHRINE (PF) 0.25% -1:200000 IJ SOLN
INTRAMUSCULAR | Status: AC
  Filled 2013-10-16: qty 30

## 2013-10-16 MED ORDER — PROMETHAZINE HCL 25 MG/ML IJ SOLN: 6.2500 mg | INTRAMUSCULAR | Status: DC | PRN

## 2013-10-16 MED ORDER — 0.9 % SODIUM CHLORIDE (POUR BTL) OPTIME
TOPICAL | Status: DC | PRN
  Administered 2013-10-16: 1000 mL

## 2013-10-16 MED ORDER — LACTATED RINGERS IV SOLN
INTRAVENOUS | Status: DC
  Administered 2013-10-16: 10:00:00 via INTRAVENOUS

## 2013-10-16 MED ORDER — ENOXAPARIN SODIUM 40 MG/0.4ML ~~LOC~~ SOLN
40.0000 mg | SUBCUTANEOUS | Status: DC
  Filled 2013-10-16 (×2): qty 0.4

## 2013-10-16 MED ORDER — ONDANSETRON HCL 4 MG/2ML IJ SOLN
INTRAMUSCULAR | Status: AC
  Filled 2013-10-16: qty 2

## 2013-10-16 MED ORDER — NEOSTIGMINE METHYLSULFATE 1 MG/ML IJ SOLN
INTRAMUSCULAR | Status: DC | PRN
  Administered 2013-10-16: 3 mg via INTRAVENOUS

## 2013-10-16 MED ORDER — NEOSTIGMINE METHYLSULFATE 1 MG/ML IJ SOLN
INTRAMUSCULAR | Status: AC
  Filled 2013-10-16: qty 10

## 2013-10-16 MED ORDER — LIDOCAINE HCL (CARDIAC) 20 MG/ML IV SOLN
INTRAVENOUS | Status: AC
Start: 2013-10-16 — End: 2013-10-16
  Filled 2013-10-16: qty 5

## 2013-10-16 MED ORDER — FENTANYL CITRATE 0.05 MG/ML IJ SOLN
25.0000 ug | INTRAMUSCULAR | Status: DC | PRN
Start: 2013-10-16 — End: 2013-10-16
  Administered 2013-10-16 (×2): 50 ug via INTRAVENOUS

## 2013-10-16 MED ORDER — OXYCODONE HCL 5 MG/5ML PO SOLN: 5.0000 mg | Freq: Once | ORAL | Status: AC | PRN

## 2013-10-16 MED ORDER — PROPOFOL 10 MG/ML IV BOLUS
INTRAVENOUS | Status: AC
  Filled 2013-10-16: qty 20

## 2013-10-16 MED ORDER — ROCURONIUM BROMIDE 50 MG/5ML IV SOLN
INTRAVENOUS | Status: AC
  Filled 2013-10-16: qty 1

## 2013-10-16 MED ORDER — MORPHINE SULFATE 2 MG/ML IJ SOLN
2.0000 mg | INTRAMUSCULAR | Status: DC | PRN
  Administered 2013-10-16 (×3): 4 mg via INTRAVENOUS
  Administered 2013-10-17: 2 mg via INTRAVENOUS
  Filled 2013-10-16: qty 2
  Filled 2013-10-16: qty 1
  Filled 2013-10-16 (×2): qty 2

## 2013-10-16 MED ORDER — ACETAMINOPHEN 160 MG/5ML PO SOLN
325.0000 mg | ORAL | Status: DC | PRN
  Filled 2013-10-16: qty 20.3

## 2013-10-16 MED ORDER — ACETAMINOPHEN 325 MG PO TABS
325.0000 mg | ORAL_TABLET | ORAL | Status: DC | PRN
  Administered 2013-10-16: 650 mg via ORAL

## 2013-10-16 MED ORDER — ACETAMINOPHEN 325 MG PO TABS: 650.0000 mg | ORAL_TABLET | ORAL | Status: DC | PRN

## 2013-10-16 MED ORDER — GLYCOPYRROLATE 0.2 MG/ML IJ SOLN
INTRAMUSCULAR | Status: AC
  Filled 2013-10-16: qty 2

## 2013-10-16 SURGICAL SUPPLY — 48 items
APL SKNCLS STERI-STRIP NONHPOA (GAUZE/BANDAGES/DRESSINGS) ×1
APPLIER CLIP ROT 10 11.4 M/L (STAPLE) ×3
APR CLP MED LRG 11.4X10 (STAPLE) ×1
BAG SPEC RTRVL LRG 6X4 10 (ENDOMECHANICALS) ×1
BENZOIN TINCTURE PRP APPL 2/3 (GAUZE/BANDAGES/DRESSINGS) ×3 IMPLANT
BLADE SURG ROTATE 9660 (MISCELLANEOUS) IMPLANT
CANISTER SUCTION 2500CC (MISCELLANEOUS) ×3 IMPLANT
CHLORAPREP W/TINT 26ML (MISCELLANEOUS) ×3 IMPLANT
CLIP APPLIE ROT 10 11.4 M/L (STAPLE) ×1 IMPLANT
COVER MAYO STAND STRL (DRAPES) ×3 IMPLANT
COVER SURGICAL LIGHT HANDLE (MISCELLANEOUS) ×3 IMPLANT
DRAPE C-ARM 42X72 X-RAY (DRAPES) ×3 IMPLANT
DRAPE UTILITY 15X26 W/TAPE STR (DRAPE) ×6 IMPLANT
DRSG TEGADERM 2-3/8X2-3/4 SM (GAUZE/BANDAGES/DRESSINGS) ×5 IMPLANT
DRSG TEGADERM 4X4.75 (GAUZE/BANDAGES/DRESSINGS) ×3 IMPLANT
ELECT REM PT RETURN 9FT ADLT (ELECTROSURGICAL) ×3
ELECTRODE REM PT RTRN 9FT ADLT (ELECTROSURGICAL) ×1 IMPLANT
FILTER SMOKE EVAC LAPAROSHD (FILTER) ×3 IMPLANT
GAUZE SPONGE 2X2 8PLY STRL LF (GAUZE/BANDAGES/DRESSINGS) ×1 IMPLANT
GLOVE BIO SURGEON STRL SZ7 (GLOVE) ×5 IMPLANT
GLOVE BIO SURGEON STRL SZ7.5 (GLOVE) ×2 IMPLANT
GLOVE BIO SURGEON STRL SZ8 (GLOVE) ×2 IMPLANT
GLOVE BIOGEL PI IND STRL 7.5 (GLOVE) ×1 IMPLANT
GLOVE BIOGEL PI IND STRL 8 (GLOVE) IMPLANT
GLOVE BIOGEL PI INDICATOR 7.5 (GLOVE) ×6
GLOVE BIOGEL PI INDICATOR 8 (GLOVE) ×2
GLOVE ECLIPSE 7.5 STRL STRAW (GLOVE) ×2 IMPLANT
GOWN STRL REUS W/ TWL LRG LVL3 (GOWN DISPOSABLE) ×4 IMPLANT
GOWN STRL REUS W/TWL LRG LVL3 (GOWN DISPOSABLE) ×12
KIT BASIN OR (CUSTOM PROCEDURE TRAY) ×3 IMPLANT
KIT ROOM TURNOVER OR (KITS) ×3 IMPLANT
NS IRRIG 1000ML POUR BTL (IV SOLUTION) ×3 IMPLANT
PAD ARMBOARD 7.5X6 YLW CONV (MISCELLANEOUS) ×3 IMPLANT
POUCH SPECIMEN RETRIEVAL 10MM (ENDOMECHANICALS) ×3 IMPLANT
SCISSORS LAP 5X35 DISP (ENDOMECHANICALS) ×3 IMPLANT
SET CHOLANGIOGRAPH 5 50 .035 (SET/KITS/TRAYS/PACK) ×3 IMPLANT
SET IRRIG TUBING LAPAROSCOPIC (IRRIGATION / IRRIGATOR) ×3 IMPLANT
SLEEVE ENDOPATH XCEL 5M (ENDOMECHANICALS) ×3 IMPLANT
SPECIMEN JAR SMALL (MISCELLANEOUS) ×3 IMPLANT
SPONGE GAUZE 2X2 STER 10/PKG (GAUZE/BANDAGES/DRESSINGS) ×2
SUT MNCRL AB 4-0 PS2 18 (SUTURE) ×3 IMPLANT
TOWEL OR 17X24 6PK STRL BLUE (TOWEL DISPOSABLE) ×3 IMPLANT
TOWEL OR 17X26 10 PK STRL BLUE (TOWEL DISPOSABLE) ×3 IMPLANT
TOWEL OR NON WOVEN STRL DISP B (DISPOSABLE) ×2 IMPLANT
TRAY LAPAROSCOPIC (CUSTOM PROCEDURE TRAY) ×3 IMPLANT
TROCAR XCEL BLUNT TIP 100MML (ENDOMECHANICALS) ×3 IMPLANT
TROCAR XCEL NON-BLD 11X100MML (ENDOMECHANICALS) ×3 IMPLANT
TROCAR XCEL NON-BLD 5MMX100MML (ENDOMECHANICALS) ×3 IMPLANT

## 2013-10-16 NOTE — Anesthesia Preprocedure Evaluation (Addendum)
Anesthesia Evaluation  Patient identified by MRN, date of birth, ID band Patient awake    Reviewed: Allergy & Precautions, H&P , NPO status , Patient's Chart, lab work & pertinent test results  History of Anesthesia Complications Negative for: history of anesthetic complications  Airway Mallampati: III TM Distance: >3 FB Neck ROM: Full    Dental  (+) Teeth Intact   Pulmonary neg shortness of breath, neg sleep apnea, neg COPDneg recent URI, Current Smoker,  breath sounds clear to auscultation        Cardiovascular negative cardio ROS  Rhythm:Regular     Neuro/Psych  Headaches, negative psych ROS   GI/Hepatic Neg liver ROS, GERD-  ,  Endo/Other  Morbid obesity  Renal/GU negative Renal ROS     Musculoskeletal negative musculoskeletal ROS (+)   Abdominal   Peds  Hematology negative hematology ROS (+)   Anesthesia Other Findings   Reproductive/Obstetrics                          Anesthesia Physical Anesthesia Plan  ASA: II  Anesthesia Plan: General   Post-op Pain Management:    Induction: Intravenous  Airway Management Planned: Oral ETT  Additional Equipment: None  Intra-op Plan:   Post-operative Plan: Extubation in OR  Informed Consent: I have reviewed the patients History and Physical, chart, labs and discussed the procedure including the risks, benefits and alternatives for the proposed anesthesia with the patient or authorized representative who has indicated his/her understanding and acceptance.   Dental advisory given  Plan Discussed with: CRNA and Surgeon  Anesthesia Plan Comments:         Anesthesia Quick Evaluation

## 2013-10-16 NOTE — Progress Notes (Signed)
  Subjective: +abd pain, no n/v.  LFTs normal on admission.   Shes NPO.   Objective: Vital signs in last 24 hours: Temp:  [98.1 F (36.7 C)-98.3 F (36.8 C)] 98.3 F (36.8 C) (04/15 0545) Pulse Rate:  [60-86] 69 (04/15 0545) Resp:  [16-20] 16 (04/15 0545) BP: (99-145)/(58-84) 121/63 mmHg (04/15 0545) SpO2:  [96 %-100 %] 99 % (04/15 0545) Weight:  [240 lb (108.863 kg)] 240 lb (108.863 kg) (04/14 1758)    Intake/Output from previous day:   Intake/Output this shift:   PE General appearance: alert, cooperative and no distress Resp: clear to auscultation bilaterally Cardio: regular rate and rhythm, S1, S2 normal, no murmur, click, rub or gallop GI: bowel sounds ar present, abdomen is soft, ttp to ruq without guarding.  Extremities: extremities normal, atraumatic, no cyanosis or edema  Lab Results:   Recent Labs  10/15/13 1829 10/16/13 0415  WBC 8.1 7.5  HGB 13.2 12.4  HCT 38.7 37.0  PLT 205 180   BMET  Recent Labs  10/15/13 1829 10/16/13 0415  NA 140 140  K 3.9 3.6*  CL 103 102  CO2 24 24  GLUCOSE 84 86  BUN 11 8  CREATININE 0.64 0.68  CALCIUM 9.3 8.6   PT/INR No results found for this basename: LABPROT, INR,  in the last 72 hours ABG No results found for this basename: PHART, PCO2, PO2, HCO3,  in the last 72 hours  Studies/Results: Koreas Abdomen Complete  10/15/2013   CLINICAL DATA:  Right upper quadrant abdominal pain and known history of cholelithiasis.  EXAM: ULTRASOUND ABDOMEN COMPLETE  COMPARISON:  US ABDOMEN COMPLETE dated 09/06/2013; CT ABD W/CM dated 11/02/2008  FINDINGS: Gallbladder:  The gallbladder contains multiple shadowing calculi. Although the gallbladder wall does not appear thickened, the patient was focally tender over the gallbladder during examination. No surrounding fluid collections are identified.  Common bile duct:  Diameter: Normal caliber of 5 mm.  Liver:  No focal lesion identified. Within normal limits in parenchymal echogenicity.  IVC:   No abnormality visualized.  Pancreas:  Visualized portion unremarkable.  Spleen:  Size and appearance within normal limits.  Right Kidney:  Length: 11.8 cm. Echogenicity within normal limits. No mass or hydronephrosis visualized.  Left Kidney:  Length: 11.3 cm. Echogenicity within normal limits. No mass or hydronephrosis visualized.  Abdominal aorta:  No aneurysm visualized.  Other findings:  None.  IMPRESSION: Multiple echogenic calculi throughout the gallbladder lumen. A positive sonographic Murphy's sign was elicited during the examination. There is no evidence of biliary obstruction.   Electronically Signed   By: Irish LackGlenn  Yamagata M.D.   On: 10/15/2013 21:52    Anti-infectives: Anti-infectives   Start     Dose/Rate Route Frequency Ordered Stop   10/16/13 0048  cefTRIAXone (ROCEPHIN) 1 g in dextrose 5 % 50 mL IVPB     1 g 100 mL/hr over 30 Minutes Intravenous Every 24 hours 10/16/13 0048        Assessment/Plan: Symptomatic cholelithiasis with possible early acute cholecystitis  -OR this AM around 10:15 -obtain consent -Rocephin -SCDs, hold lovenox -IVF -pain meds, antiemetics    LOS: 1 day    Ashley [name] ANP-BC 10/16/2013 8:08 AM

## 2013-10-16 NOTE — Progress Notes (Signed)
Informed pt. That we are not responsible for her belongings while she is in surgery.  Pt. Left a coach wallet, glasses, and tongue piercing in bedside stand top drawer.  Pt. Said she did not want her things sent down to security or sent home.  Pt. Stated per night RN-"I don't have anything in my wallet."   Ashley [name]

## 2013-10-16 NOTE — Transfer of Care (Signed)
Immediate Anesthesia Transfer of Care Note  Patient: Ashley [name]  Procedure(s) Performed: Procedure(s): LAPAROSCOPIC CHOLECYSTECTOMY WITH INTRAOPERATIVE CHOLANGIOGRAM (N/A)  Patient Location: PACU  Anesthesia Type:General  Level of Consciousness: awake, alert  and oriented  Airway & Oxygen Therapy: Patient Spontanous Breathing and Patient connected to nasal cannula oxygen  Post-op Assessment: Report given to PACU RN, Post -op Vital signs reviewed and stable and Patient moving all extremities  Post vital signs: Reviewed and stable  Complications: No apparent anesthesia complications

## 2013-10-16 NOTE — Progress Notes (Signed)
Plan Lap chole with IOC today.  Discussed with patient.  The surgical procedure has been discussed with the patient.  Potential risks, benefits, alternative treatments, and expected outcomes have been explained.  All of the patient's questions at this time have been answered.  The likelihood of reaching the patient's treatment goal is good.  The patient understand the proposed surgical procedure and wishes to proceed. Wilmon ArmsMatthew K. Corliss Skainssuei, MD, [facility]  General/ Trauma Surgery  10/16/2013 9:29 AM

## 2013-10-16 NOTE — Anesthesia Procedure Notes (Signed)
Procedure Name: Intubation Date/Time: 10/16/2013 10:31 AM Performed by: Charm BargesBUTLER, Pope Brunty R Pre-anesthesia Checklist: Patient identified, Emergency Drugs available, Suction available, Patient being monitored and Timeout performed Patient Re-evaluated:Patient Re-evaluated prior to inductionOxygen Delivery Method: Circle system utilized Preoxygenation: Pre-oxygenation with 100% oxygen Intubation Type: IV induction Ventilation: Mask ventilation with difficulty Laryngoscope Size: Mac, 3 and 4 Grade View: Grade II Tube type: Oral Tube size: 7.5 mm Number of attempts: 2 Airway Equipment and Method: Stylet (First attempt by EMT student without identification of VC, Grade II view with MAC4, significant extra soft tissue at arytenoids.) Placement Confirmation: ETT inserted through vocal cords under direct vision,  positive ETCO2 and breath sounds checked- equal and bilateral Secured at: 23 cm Tube secured with: Tape Dental Injury: Bloody posterior oropharynx

## 2013-10-16 NOTE — Op Note (Signed)
Laparoscopic Cholecystectomy with IOC Procedure Note  Indications: This patient presents with symptomatic gallbladder disease and will undergo laparoscopic cholecystectomy.  Pre-operative Diagnosis: Calculus of gallbladder with acute cholecystitis, without mention of obstruction  Post-operative Diagnosis: Same  Surgeon: Ashley [name]   Assistants: Ashley [name]  Anesthesia: General endotracheal anesthesia  ASA Class: 2  Procedure Details  The patient was seen again in the Holding Room. The risks, benefits, complications, treatment options, and expected outcomes were discussed with the patient. The possibilities of reaction to medication, pulmonary aspiration, perforation of viscus, bleeding, recurrent infection, finding a normal gallbladder, the need for additional procedures, failure to diagnose a condition, the possible need to convert to an open procedure, and creating a complication requiring transfusion or operation were discussed with the patient. The likelihood of improving the patient's symptoms with return to their baseline status is good.  The patient and/or family concurred with the proposed plan, giving informed consent. The site of surgery properly noted. The patient was taken to Operating Room, identified as Ashley [name] and the procedure verified as Laparoscopic Cholecystectomy with Intraoperative Cholangiogram. A Time Out was held and the above information confirmed.  Prior to the induction of general anesthesia, antibiotic prophylaxis was administered. General endotracheal anesthesia was then administered and tolerated well. After the induction, the abdomen was prepped with Chloraprep and draped in the sterile fashion. The patient was positioned in the supine position.  Local anesthetic agent was injected into the skin above the umbilicus and an incision made. We dissected down to the abdominal fascia with blunt dissection.  The fascia was incised vertically and  we entered the peritoneal cavity bluntly.  A pursestring suture of 0-Vicryl was placed around the fascial opening.  The Hasson cannula was inserted and secured with the stay suture.  Pneumoperitoneum was then created with CO2 and tolerated well without any adverse changes in the patient's vital signs. An 11-mm port was placed in the subxiphoid position.  Two 5-mm ports were placed in the right upper quadrant. All skin incisions were infiltrated with a local anesthetic agent before making the incision and placing the trocars.   We positioned the patient in reverse Trendelenburg, tilted slightly to the patient's left.  The gallbladder was identified, the fundus grasped and retracted cephalad. Adhesions were lysed bluntly and with the electrocautery where indicated, taking care not to injure any adjacent organs or viscus. The infundibulum was grasped and retracted laterally, exposing the peritoneum overlying the triangle of Calot. This was then divided and exposed in a blunt fashion. A critical view of the cystic duct and cystic artery was obtained.  The cystic duct was clearly identified and bluntly dissected circumferentially. The cystic duct was ligated with a clip distally.   An incision was made in the cystic duct and the [facility] catheter introduced. The catheter was secured using a clip. A cholangiogram was then obtained which showed good visualization of the distal and proximal biliary tree with no sign of filling defects or obstruction.  Contrast flowed easily into the duodenum. The catheter was then removed.   The cystic duct was then ligated with clips and divided. The cystic artery was identified, dissected free, ligated with clips and divided as well.   The gallbladder was dissected from the liver bed in retrograde fashion with the electrocautery. The gallbladder was removed and placed in an Endocatch sac. The liver bed was irrigated and inspected. Hemostasis was achieved with the  electrocautery. Copious irrigation was utilized and was repeatedly  aspirated until clear.  The gallbladder and Endocatch sac were then removed through the umbilical port site.  The pursestring suture was used to close the umbilical fascia.    We again inspected the right upper quadrant for hemostasis.  Pneumoperitoneum was released as we removed the trocars.  4-0 Monocryl was used to close the skin.   Benzoin, steri-strips, and clean dressings were applied. The patient was then extubated and brought to the recovery room in stable condition. Instrument, sponge, and needle counts were correct at closure and at the conclusion of the case.   Findings: Cholecystitis with Cholelithiasis  Estimated Blood Loss: Minimal         Drains: none         Specimens: Gallbladder           Complications: None; patient tolerated the procedure well.         Disposition: PACU - hemodynamically stable.         Condition: stable  Ashley ArmsMatthew K. Corliss Skainssuei, MD, [facility]  General/ Trauma Surgery  10/16/2013 11:32 AM

## 2013-10-16 NOTE — ED Provider Notes (Signed)
Medical screening examination/treatment/procedure(s) were performed by non-physician practitioner and as supervising physician I was immediately available for consultation/collaboration.   EKG Interpretation None       Gara Kincade R. Claborn Janusz, MD 10/16/13 0056 

## 2013-10-17 ENCOUNTER — Encounter (HOSPITAL_COMMUNITY): Payer: Self-pay | Admitting: Surgery

## 2013-10-17 MED ORDER — OXYCODONE-ACETAMINOPHEN 5-325 MG PO TABS: 1.0000 | ORAL_TABLET | Freq: Four times a day (QID) | ORAL | Status: DC | PRN

## 2013-10-17 MED ORDER — ACETAMINOPHEN 325 MG PO TABS: 650.0000 mg | ORAL_TABLET | ORAL | Status: DC | PRN

## 2013-10-17 NOTE — Discharge Instructions (Signed)

## 2013-10-17 NOTE — Discharge Summary (Signed)
Physician Discharge Summary  Patient ID: Ashley [name] MRN: [id number] DOB/AGE: 30/07/1985 30 y.o.  Admit date: 10/15/2013 Discharge date: 10/17/2013  Admitting Diagnosis: Symptomatic cholelithiasis with acute cholecystitis   Discharge Diagnosis Patient Active Problem List   Diagnosis Date Noted  . Symptomatic cholelithiasis 10/15/2013  . Status post repeat low transverse cesarean section 01/31/2012  . Irregular periods/menstrual cycles 01/30/2012  . Poor social situation 01/30/2012  . Late prenatal care 01/30/2012  . Pregnant state, incidental 11/13/2011  . Obesity 11/13/2011  . H/O cesarean section 11/13/2011    Consultants none  Imaging: Dg Cholangiogram Operative  10/16/2013   CLINICAL DATA:  Cholelithiasis  EXAM: INTRAOPERATIVE CHOLANGIOGRAM  TECHNIQUE: Cholangiographic images from the C-arm fluoroscopic device were submitted for interpretation post-operatively. Please see the procedural report for the amount of contrast and the fluoroscopy time utilized.  COMPARISON:  None.  FINDINGS: No persistent filling defects in the common duct. Intrahepatic ducts are incompletely visualized, appearing decompressed centrally. Contrast passes into the duodenum.  : Negative for retained common duct stone.   Electronically Signed   By: Oley Balmaniel  Hassell M.D.   On: 10/16/2013 13:16   [place] Abdomen Complete  10/15/2013   CLINICAL DATA:  Right upper quadrant abdominal pain and known history of cholelithiasis.  EXAM: ULTRASOUND ABDOMEN COMPLETE  COMPARISON:  US ABDOMEN COMPLETE dated 09/06/2013; CT ABD W/CM dated 11/02/2008  FINDINGS: Gallbladder:  The gallbladder contains multiple shadowing calculi. Although the gallbladder wall does not appear thickened, the patient was focally tender over the gallbladder during examination. No surrounding fluid collections are identified.  Common bile duct:  Diameter: Normal caliber of 5 mm.  Liver:  No focal lesion identified. Within normal limits in parenchymal  echogenicity.  IVC:  No abnormality visualized.  Pancreas:  Visualized portion unremarkable.  Spleen:  Size and appearance within normal limits.  Right Kidney:  Length: 11.8 cm. Echogenicity within normal limits. No mass or hydronephrosis visualized.  Left Kidney:  Length: 11.3 cm. Echogenicity within normal limits. No mass or hydronephrosis visualized.  Abdominal aorta:  No aneurysm visualized.  Other findings:  None.  IMPRESSION: Multiple echogenic calculi throughout the gallbladder lumen. A positive sonographic Murphy's sign was elicited during the examination. There is no evidence of biliary obstruction.   Electronically Signed   By: Irish LackGlenn  Yamagata M.D.   On: 10/15/2013 21:52    Procedures Laparoscopic Cholecystectomy with IOC Procedure---Dr. Corliss Skainssuei 10/16/13   Hospital Course:  [name].  Workup showed symptomatic cholelithiasis with possible acute cholecystitis.  Patient was admitted and underwent procedure listed above.  Tolerated procedure well and was transferred to the floor.  Diet was advanced as tolerated.  On POD@1 , the patient was voiding well, tolerating diet, ambulating well, pain well controlled, vital signs stable, incisions c/d/i and felt stable for discharge home.  Medication risks, benefits and therapeutic alternatives were discussed.  Warning signs that warrant immediate attention were reviewed.  Patient will follow up in our office in 2 weeks and knows to call with questions or concerns.  Physical Exam: General:  Alert, NAD, pleasant, comfortable Abd:  Soft, ND, mild tenderness, incisions C/D/I    Medication List         acetaminophen 325 MG tablet  Commonly known as:  TYLENOL  Take 2 tablets (650 mg total) by mouth every 4 (four) hours as needed for mild pain.     oxyCODONE-acetaminophen 5-325 MG per tablet  Commonly known as:  PERCOCET/ROXICET  Take 1 tablet by  mouth every 6 (six) hours as needed for moderate pain.              Follow-up Information   Follow up with [facility] On 11/05/2013. (ar[facility] by [facility] for a 3:30PM post operative check)    Contact information:   [address]   [place] [address] [phone]       Signed: Ashok Norrismina Jolleen Seman, [facility] Center[facility] [phone]  10/17/2013, 9:17 AM

## 2013-10-17 NOTE — Discharge Summary (Signed)
Ready for discharge. Feeling much better.  Wilmon ArmsMatthew K. Corliss Skainssuei, MD, [facility]  General/ Trauma Surgery  10/17/2013 10:14 AM

## 2013-10-17 NOTE — Progress Notes (Signed)
Discharge instructions reviewed with patient, questions answered, verbalized understanding.  Written prescription for pain medication given to patient.  Patient transported to front of hospital via wheelchair to be taken home by friend.  Patient in good condition at time of discharge from [facility].

## 2013-10-17 NOTE — Anesthesia Postprocedure Evaluation (Signed)
  Anesthesia Post-op Note  Patient: Ashley [name]  Procedure(s) Performed: Procedure(s): LAPAROSCOPIC CHOLECYSTECTOMY WITH INTRAOPERATIVE CHOLANGIOGRAM (N/A)  Patient Location: PACU  Anesthesia Type:General  Level of Consciousness: awake, alert  and oriented  Airway and Oxygen Therapy: Patient Spontanous Breathing  Post-op Pain: mild  Post-op Assessment: Post-op Vital signs reviewed, Patient's Cardiovascular Status Stable, Respiratory Function Stable, Patent Airway, No signs of Nausea or vomiting and Pain level controlled  Post-op Vital Signs: Reviewed and stable  Last Vitals:  Filed Vitals:   10/17/13 0620  BP: 138/70  Pulse: 80  Temp: 36.4 C  Resp: 17    Complications: No apparent anesthesia complications

## 2013-11-05 ENCOUNTER — Ambulatory Visit (INDEPENDENT_AMBULATORY_CARE_PROVIDER_SITE_OTHER): Payer: Medicaid Other | Admitting: General Surgery

## 2013-11-05 ENCOUNTER — Encounter (INDEPENDENT_AMBULATORY_CARE_PROVIDER_SITE_OTHER): Payer: Self-pay

## 2013-11-05 VITALS — BP 130/82 | HR 80 | Temp 98.8°F | Resp 14 | Ht 62.0 in | Wt 248.0 lb

## 2013-11-05 DIAGNOSIS — K8010 Calculus of gallbladder with chronic cholecystitis without obstruction: Secondary | ICD-10-CM

## 2013-11-05 DIAGNOSIS — Z9889 Other specified postprocedural states: Secondary | ICD-10-CM

## 2013-11-05 DIAGNOSIS — Z9049 Acquired absence of other specified parts of digestive tract: Secondary | ICD-10-CM

## 2013-11-05 NOTE — Patient Instructions (Addendum)
She may resume a regular diet and gradual return to full activity.  No lifting >40lbs.  She may follow-up on a PRN basis.  Try 600mg  to 800mg  two to three times a day as needed for pain.  Continue exercising.

## 2013-11-05 NOTE — Progress Notes (Addendum)
  Subjective: Ashley [name] is a 28 y.o. female who had a laparoscopic cholecystectomy  on  10/16/13 by Dr. Corliss Skainssuei  returns to the clinic today.  Pathology reveals chronic cholecystitis and cholelithiasis.  The patient is tolerating their diet well and is having no severe pain.  Bowel function is good.  The pre-operative symptoms of abdominal pain, nausea, and vomiting have resolved.  No problems with the wounds.  Pt is returning to normal activity / work.   Objective: Vital signs in last 24 hours: Reviewed   PE: General:  Alert, NAD, pleasant Abdomen:  soft, NT/ND, +bs, incisions appear well-healed with no sign of infection or bleeding   Assessment/Plan  1.  S/P Laparoscopic Cholecystectomy: doing well, may resume activity as tolerated, no high impact activity, but no lifting >40lbs, Pt will follow up with us PRN and knows to call with questions or concerns.  Encourage continued weight loss.  Encouraged 600mg  to 800mg  BID or TID as needed for pain.  No need to refill narcotic.    Aris GeorgiaMegan Dort, PA-C 11/05/2013

## 2013-11-25 ENCOUNTER — Encounter (HOSPITAL_COMMUNITY): Payer: Self-pay | Admitting: Emergency Medicine

## 2013-11-25 ENCOUNTER — Emergency Department (HOSPITAL_COMMUNITY)
Admission: EM | Admit: 2013-11-25 | Discharge: 2013-11-25 | Disposition: A | Discharge status: Home / Self Care | Payer: Medicaid Other | Attending: Emergency Medicine | Admitting: Emergency Medicine

## 2013-11-25 DIAGNOSIS — B86 Scabies: Secondary | ICD-10-CM | POA: Insufficient documentation

## 2013-11-25 DIAGNOSIS — Z8719 Personal history of other diseases of the digestive system: Secondary | ICD-10-CM | POA: Insufficient documentation

## 2013-11-25 DIAGNOSIS — F17200 Nicotine dependence, unspecified, uncomplicated: Secondary | ICD-10-CM | POA: Insufficient documentation

## 2013-11-25 DIAGNOSIS — Z8742 Personal history of other diseases of the female genital tract: Secondary | ICD-10-CM | POA: Insufficient documentation

## 2013-11-25 DIAGNOSIS — Z8619 Personal history of other infectious and parasitic diseases: Secondary | ICD-10-CM | POA: Insufficient documentation

## 2013-11-25 MED ORDER — PERMETHRIN 5 % EX CREA: TOPICAL_CREAM | CUTANEOUS | Status: DC

## 2013-11-25 NOTE — ED Provider Notes (Signed)
CSN: [id number]     Arrival date & time 11/25/13  0840 History   First MD Initiated Contact with Patient 11/25/13 1008    This chart was scribed for Kristie Cowman, a non-physician practitioner working with Ethelda Chick, MD by Lewanda Rife, ED Scribe. This patient was seen in room TR10C/TR10C and the patient's care was started at 10:10 AM     Chief Complaint  Patient presents with  . Rash     (Consider location/radiation/quality/duration/timing/severity/associated sxs/prior Treatment) The history is provided by the patient. No language interpreter was used.   HPI Comments: Ashley [name] is a 28 y.o. female who presents to the Emergency Department complaining of constant diffuse rash onset 4 days. Describes rash as pruritic and gradually worsening in severity. States rash is located on bilateral legs, arms, and back. Denies any aggravating or alleviating factors. Denies associated swelling of tongue, shortness of breath, swelling of lips, fever, and chills. Reports son was dx with scabbies today.   Past Medical History  Diagnosis Date  . Headache(784.0)     3X WEEK  . 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 GERD (gastroesophageal reflux disease) 2012    diet controlled - no meds  . Irregular periods/menstrual cycles 01/30/2012  . Poor social situation 01/30/2012  . Late prenatal care 01/30/2012   Past Surgical History  Procedure Laterality Date  . Cesarean section  2009  . Tonsillectomy  AGE 1  . Tonsillectomy and adenoidectomy  AGEB 18  . Cesarean section  01/30/2012    Procedure: CESAREAN SECTION;  Surgeon: Michael Litter, MD;  Location: [facility];  Service: Gynecology;  Laterality: N/A;  Repeat C/S  . Cholecystectomy N/A 10/16/2013    Procedure: LAPAROSCOPIC CHOLECYSTECTOMY WITH INTRAOPERATIVE CHOLANGIOGRAM;  Surgeon: Wilmon Arms. Corliss Skains, MD;  Location: [facility] OR;  Service: General;  Laterality: N/A;   Family History  Problem Relation Age of Onset  . Asthma Mother   .  Hypertension Mother   . Early death Mother     AGE 42  . Cancer Maternal Grandmother    History  Substance Use Topics  . Smoking status: Current Some Day Smoker -- 0.50 packs/day for 6 years    Types: Cigarettes  . Smokeless tobacco: Never Used  . Alcohol Use: 1.5 oz/week    3 drink(s) per week     Comment: LIQUOR;  LAST ALCOLHOL last week   OB History   Grav Para Term Preterm Abortions TAB SAB Ect Mult Living   2 2 2       2      Obstetric Comments   NO PRENATAL CARE WITH FIRST PREGNANCY. 36-38 WKS  GEATATION.  "DID NOT KNOW SHE WAS PREGNANT UNTIL DELIVERY."     Review of Systems  Constitutional: Negative for fever.  HENT: Negative.   Respiratory: Negative for shortness of breath.   Skin: Positive for rash.      Allergies  Review of patient's allergies indicates no known allergies.  Home Medications   Prior to Admission medications   Not on File   BP 125/71  Pulse 74  Temp(Src) 98.1 F (36.7 C) (Oral)  Resp 20  Wt 244 lb 5 oz (110.819 kg)  SpO2 99% Physical Exam  Nursing note and vitals reviewed. Constitutional: She is oriented to person, place, and time. She appears well-developed and well-nourished. No distress.  HENT:  Head: Normocephalic and atraumatic.  Mouth/Throat: Uvula is midline and oropharynx is clear and moist. No oropharyngeal exudate, posterior oropharyngeal  edema or posterior oropharyngeal erythema.  No lip swelling, no tongue swelling. Oropharynx is patent.   Eyes: EOM are normal.  Neck: Neck supple.  Cardiovascular: Normal rate.   Pulmonary/Chest: Effort normal. No respiratory distress.  Musculoskeletal: Normal range of motion.  Neurological: She is alert and oriented to person, place, and time.  Skin: Skin is warm and dry. Rash noted. There is erythema.  Erythematous papular rash located on bilateral arms, back, and bilateral legs. No drainage.   Psychiatric: She has a normal mood and affect. Her behavior is normal.    ED Course   Procedures (including critical care time) COORDINATION OF CARE:  Nursing notes reviewed. Vital signs reviewed. Initial pt interview and examination performed.   Filed Vitals:   11/25/13 0850  BP: 125/71  Pulse: 74  Temp: 98.1 F (36.7 C)  TempSrc: Oral  Resp: 20  Weight: 244 lb 5 oz (110.819 kg)  SpO2: 99%    10:11 AM-Discussed work up plan with pt at bedside, which includes No orders of the defined types were placed in this encounter.  . Pt agrees with plan.   Treatment plan initiated:Medications - No data to display   Initial diagnostic testing ordered.         Labs Review Labs Reviewed - No data to display  Imaging Review No results found.   EKG Interpretation None      MDM   Final diagnoses:  None   Patient presenting with a diffuse erythematous rash.  Son diagnosed with Scabies this morning.  No swelling of lips, tongue, or oropharynx.  Patient given Rx for Permethrin cream.   I personally performed the services described in this documentation, which was scribed in my presence. The recorded information has been reviewed and is accurate.    Santiago GladHeather Decarla Siemen, PA-C 11/25/13 1124

## 2013-11-25 NOTE — ED Provider Notes (Signed)
Medical screening examination/treatment/procedure(s) were performed by non-physician practitioner and as supervising physician I was immediately available for consultation/collaboration.   EKG Interpretation None       Ethelda Chick, MD 11/25/13 1144

## 2013-11-25 NOTE — ED Notes (Signed)
Erythema, raised lesions without weeping or oozing noted on bilateral arms. Pruritic. NO meds PTA

## 2013-11-25 NOTE — Discharge Instructions (Signed)
Scabies  Scabies are small bugs (mites) that burrow under the skin and cause red bumps and severe itching. These bugs can only be seen with a microscope. Scabies are highly contagious. They can spread easily from person to person by direct contact. They are also spread through sharing clothing or linens that have the scabies mites living in them. It is not unusual for an entire family to become infected through shared towels, clothing, or bedding.   HOME CARE INSTRUCTIONS   · Your caregiver may prescribe a cream or lotion to kill the mites. If cream is prescribed, massage the cream into the entire body from the neck to the bottom of both feet. Also massage the cream into the scalp and face if your child is less than 1 year old. Avoid the eyes and mouth. Do not wash your hands after application.  · Leave the cream on for 8 to 12 hours. Your child should bathe or shower after the 8 to 12 hour application period. Sometimes it is helpful to apply the cream to your child right before bedtime.  · One treatment is usually effective and will eliminate approximately 95% of infestations. For severe cases, your caregiver may decide to repeat the treatment in 1 week. Everyone in your household should be treated with one application of the cream.  · New rashes or burrows should not appear within 24 to 48 hours after successful treatment. However, the itching and rash may last for 2 to 4 weeks after successful treatment. Your caregiver may prescribe a medicine to help with the itching or to help the rash go away more quickly.  · Scabies can live on clothing or linens for up to 3 days. All of your child's recently used clothing, towels, stuffed toys, and bed linens should be washed in hot water and then dried in a dryer for at least 20 minutes on high heat. Items that cannot be washed should be enclosed in a plastic bag for at least 3 days.  · To help relieve itching, bathe your child in a cool bath or apply cool washcloths to the  affected areas.  · Your child may return to school after treatment with the prescribed cream.  SEEK MEDICAL CARE IF:   · The itching persists longer than 4 weeks after treatment.  · The rash spreads or becomes infected. Signs of infection include red blisters or yellow-tan crust.  Document Released: 06/20/2005 Document Revised: 09/12/2011 Document Reviewed: 10/29/2008  Exit[employer]® Patient Information ©2014 [employer].

## 2013-12-20 ENCOUNTER — Telehealth (INDEPENDENT_AMBULATORY_CARE_PROVIDER_SITE_OTHER): Payer: Self-pay

## 2013-12-20 NOTE — Telephone Encounter (Signed)
Patient states she called yesterday and this morning  for note to be out of work due to abdomen/navel pain. Advised patient we do not a note about her calling and her surgery was done in April and DR. Tsuei would need to elevate her , she may need to go to the ED to be evaluated and ED physician may give her a work note . Patient verbalized understnading

## 2013-12-23 ENCOUNTER — Encounter (INDEPENDENT_AMBULATORY_CARE_PROVIDER_SITE_OTHER): Payer: Self-pay | Admitting: General Surgery

## 2014-01-20 ENCOUNTER — Encounter (HOSPITAL_COMMUNITY): Payer: Self-pay | Admitting: Emergency Medicine

## 2014-01-20 ENCOUNTER — Emergency Department (INDEPENDENT_AMBULATORY_CARE_PROVIDER_SITE_OTHER)
Admission: EM | Admit: 2014-01-20 | Discharge: 2014-01-20 | Disposition: A | Discharge status: Home / Self Care | Payer: Medicaid Other | Source: Home / Self Care

## 2014-01-20 DIAGNOSIS — S058X9A Other injuries of unspecified eye and orbit, initial encounter: Secondary | ICD-10-CM

## 2014-01-20 DIAGNOSIS — H109 Unspecified conjunctivitis: Secondary | ICD-10-CM

## 2014-01-20 DIAGNOSIS — S0501XA Injury of conjunctiva and corneal abrasion without foreign body, right eye, initial encounter: Secondary | ICD-10-CM

## 2014-01-20 MED ORDER — TETRACAINE HCL 0.5 % OP SOLN
OPHTHALMIC | Status: AC
  Filled 2014-01-20: qty 2

## 2014-01-20 MED ORDER — TETRACAINE HCL 0.5 % OP SOLN
2.0000 [drp] | Freq: Once | OPHTHALMIC | Status: AC
  Administered 2014-01-20: 2 [drp] via OPHTHALMIC

## 2014-01-20 MED ORDER — POLYMYXIN B-TRIMETHOPRIM 10000-0.1 UNIT/ML-% OP SOLN: 2.0000 [drp] | OPHTHALMIC | Status: DC

## 2014-01-20 NOTE — ED Provider Notes (Signed)
Medical screening examination/treatment/procedure(s) were performed by resident physician or non-physician practitioner and as supervising physician I was immediately available for consultation/collaboration.  Randal BubaErin Manav Pierotti, MD     Charm RingsErin J Jalecia Leon, MD 01/20/14 [phone]

## 2014-01-20 NOTE — Discharge Instructions (Signed)
Corneal Abrasion  The cornea is the clear covering at the front and center of the eye. When looking at the colored portion of the eye (iris), you are looking through the cornea. This very thin tissue is made up of many layers. The surface layer is a single layer of cells (corneal epithelium) and is one of the most sensitive tissues in the body. If a scratch or injury causes the corneal epithelium to come off, it is called a corneal abrasion. If the injury extends to the tissues below the epithelium, the condition is called a corneal ulcer.  CAUSES    Scratches.   Trauma.   Foreign body in the eye.  Some people have recurrences of abrasions in the area of the original injury even after it has healed (recurrent erosion syndrome). Recurrent erosion syndrome generally improves and goes away with time.  SYMPTOMS    Eye pain.   Difficulty or inability to keep the injured eye open.   The eye becomes very sensitive to light.   Recurrent erosions tend to happen suddenly, first thing in the morning, usually after waking up and opening the eye.  DIAGNOSIS   Your health care provider can diagnose a corneal abrasion during an eye exam. Dye is usually placed in the eye using a drop or a small paper strip moistened by your tears. When the eye is examined with a special light, the abrasion shows up clearly because of the dye.  TREATMENT    Small abrasions may be treated with antibiotic drops or ointment alone.   A pressure patch may be put over the eye. If this is done, follow your doctor's instructions for when to remove the patch. Do not drive or use machines while the eye patch is on. Judging distances is hard to do with a patch on.  If the abrasion becomes infected and spreads to the deeper tissues of the cornea, a corneal ulcer can result. This is serious because it can cause corneal scarring. Corneal scars interfere with light passing through the cornea and cause a loss of vision in the involved eye.  HOME CARE  INSTRUCTIONS   Use medicine or ointment as directed. Only take over-the-counter or prescription medicines for pain, discomfort, or fever as directed by your health care provider.   Do not drive or operate machinery if your eye is patched. Your ability to judge distances is impaired.   If your health care provider has given you a follow-up appointment, it is very important to keep that appointment. Not keeping the appointment could result in a severe eye infection or permanent loss of vision. If there is any problem keeping the appointment, let your health care provider know.  SEEK MEDICAL CARE IF:    You have pain, light sensitivity, and a scratchy feeling in one eye or both eyes.   Your pressure patch keeps loosening up, and you can blink your eye under the patch after treatment.   Any kind of discharge develops from the eye after treatment or if the lids stick together in the morning.   You have the same symptoms in the morning as you did with the original abrasion days, weeks, or months after the abrasion healed.  MAKE SURE YOU:    Understand these instructions.   Will watch your condition.   Will get help right away if you are not doing well or get worse.  Document Released: 06/17/2000 Document Revised: 06/25/2013 Document Reviewed: 02/25/2013  Exit[employer] Patient Information 2015 [employer].   This information is not intended to replace advice given to you by your health care provider. Make sure you discuss any questions you have with your health care provider.  Conjunctivitis  Conjunctivitis is commonly called "pink eye." Conjunctivitis can be caused by bacterial or viral infection, allergies, or injuries. There is usually redness of the lining of the eye, itching, discomfort, and sometimes discharge. There may be deposits of matter along the eyelids. A viral infection usually causes a watery discharge, while a bacterial infection causes a yellowish, thick discharge. Pink eye is very contagious and  spreads by direct contact.  You may be given antibiotic eyedrops as part of your treatment. Before using your eye medicine, remove all drainage from the eye by washing gently with warm water and cotton balls. Continue to use the medication until you have awakened 2 mornings in a row without discharge from the eye. Do not rub your eye. This increases the irritation and helps spread infection. Use separate towels from other household members. Wash your hands with soap and water before and after touching your eyes. Use cold compresses to reduce pain and sunglasses to relieve irritation from light. Do not wear contact lenses or wear eye makeup until the infection is gone.  SEEK MEDICAL CARE IF:    Your symptoms are not better after 3 days of treatment.   You have increased pain or trouble seeing.   The outer eyelids become very red or swollen.  Document Released: 07/28/2004 Document Revised: 09/12/2011 Document Reviewed: 06/20/2005  [employer] Patient Information 2015 [employer]. This information is not intended to replace advice given to you by your health care provider. Make sure you discuss any questions you have with your health care provider.

## 2014-01-20 NOTE — ED Notes (Signed)
Comfort measures

## 2014-01-20 NOTE — ED Notes (Signed)
C/o injury to right eye Thursday, eye red

## 2014-01-20 NOTE — ED Provider Notes (Signed)
CSN: [id number]     Arrival date & time 01/20/14  1020 History   First MD Initiated Contact with Patient 01/20/14 1109     Chief Complaint  Patient presents with  . Eye Injury   (Consider location/radiation/quality/duration/timing/severity/associated sxs/prior Treatment) HPI Comments: 28 year old female was playing with her boyfriend 4 days ago when he accidentally brushed her right eyelid itself. She immediately felt pain. She is complaining of persistent irritation in clear watery drainage. Her vision is normal. The left eye is unaffected.   Past Medical History  Diagnosis Date  . Headache(784.0)     3X WEEK  . 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 GERD (gastroesophageal reflux disease) 2012    diet controlled - no meds  . Irregular periods/menstrual cycles 01/30/2012  . Poor social situation 01/30/2012  . Late prenatal care 01/30/2012   Past Surgical History  Procedure Laterality Date  . Cesarean section  2009  . Tonsillectomy  AGE 29  . Tonsillectomy and adenoidectomy  AGEB 18  . Cesarean section  01/30/2012    Procedure: CESAREAN SECTION;  Surgeon: Michael Litter, MD;  Location: [facility];  Service: Gynecology;  Laterality: N/A;  Repeat C/S  . Cholecystectomy N/A 10/16/2013    Procedure: LAPAROSCOPIC CHOLECYSTECTOMY WITH INTRAOPERATIVE CHOLANGIOGRAM;  Surgeon: Wilmon Arms. Corliss Skains, MD;  Location: [facility];  Service: General;  Laterality: N/A;   Family History  Problem Relation Age of Onset  . Asthma Mother   . Hypertension Mother   . Early death Mother     AGE 41  . Cancer Maternal Grandmother    History  Substance Use Topics  . Smoking status: Current Some Day Smoker -- 0.50 packs/day for 6 years    Types: Cigarettes  . Smokeless tobacco: Never Used  . Alcohol Use: 1.5 oz/week    3 drink(s) per week     Comment: LIQUOR;  LAST ALCOLHOL last week   OB History   Grav Para Term Preterm Abortions TAB SAB Ect Mult Living   2 2 2       2      Obstetric Comments   NO PRENATAL CARE WITH  FIRST PREGNANCY. 36-38 WKS  GEATATION.  "DID NOT KNOW SHE WAS PREGNANT UNTIL DELIVERY."     Review of Systems  Constitutional: Negative.   HENT: Negative for congestion, drooling, ear pain, postnasal drip, rhinorrhea and sore throat.   Eyes: Positive for pain, discharge and redness. Negative for itching and visual disturbance.  Respiratory: Negative.     Allergies  Review of patient's allergies indicates no known allergies.  Home Medications   Prior to Admission medications   Medication Sig Start Date End Date Taking? Authorizing Provider  trimethoprim-polymyxin b (POLYTRIM) ophthalmic solution Place 2 drops into the right eye every 4 (four) hours. 01/20/14   Hayden Rasmussen, NP   BP 122/78  Pulse 68  Temp(Src) 99.1 F (37.3 C) (Oral)  Resp 22  Ht 5\' 3"  (1.6 m)  SpO2 98% Physical Exam  Nursing note and vitals reviewed. Constitutional: She is oriented to person, place, and time. She appears well-developed and well-nourished. No distress.  Eyes: EOM are normal. Pupils are equal, round, and reactive to light.  Upper and lower conjunctival erythema and swelling. Sclera mildly injected.  Under magnification there is a 1-2 mm abrasion over the cornea at the 8-9 oclock position at the the border.  Anterior chamber clear. No hyphema. No FB's seen.   Neck: Neck supple.  Pulmonary/Chest: Effort normal. No respiratory distress.  Neurological: She is alert and oriented to person, place, and time.  Skin: Skin is dry.  Psychiatric: She has a normal mood and affect.    ED Course  Procedures (including critical care time) Labs Review Labs Reviewed - No data to display  Imaging Review No results found.   MDM   1. Right corneal abrasion, initial encounter   2. Conjunctivitis of right eye    Tetracaine op 2 gtts OD EYE exam     Hayden Rasmussenavid Ramiro Pangilinan, NP 01/20/14 1204

## 2014-05-05 ENCOUNTER — Encounter (HOSPITAL_COMMUNITY): Payer: Self-pay | Admitting: Emergency Medicine

## 2014-07-08 ENCOUNTER — Encounter (HOSPITAL_COMMUNITY): Payer: Self-pay

## 2014-07-08 ENCOUNTER — Emergency Department (HOSPITAL_COMMUNITY)
Admission: EM | Admit: 2014-07-08 | Discharge: 2014-07-08 | Disposition: A | Discharge status: Home / Self Care | Payer: Medicaid Other | Attending: Emergency Medicine | Admitting: Emergency Medicine

## 2014-07-08 ENCOUNTER — Emergency Department (HOSPITAL_COMMUNITY): Payer: Medicaid Other

## 2014-07-08 DIAGNOSIS — E669 Obesity, unspecified: Secondary | ICD-10-CM | POA: Diagnosis not present

## 2014-07-08 DIAGNOSIS — Z8619 Personal history of other infectious and parasitic diseases: Secondary | ICD-10-CM | POA: Diagnosis not present

## 2014-07-08 DIAGNOSIS — Z3202 Encounter for pregnancy test, result negative: Secondary | ICD-10-CM | POA: Diagnosis not present

## 2014-07-08 DIAGNOSIS — K529 Noninfective gastroenteritis and colitis, unspecified: Secondary | ICD-10-CM | POA: Insufficient documentation

## 2014-07-08 DIAGNOSIS — A084 Viral intestinal infection, unspecified: Secondary | ICD-10-CM

## 2014-07-08 DIAGNOSIS — Z720 Tobacco use: Secondary | ICD-10-CM | POA: Insufficient documentation

## 2014-07-08 DIAGNOSIS — R1084 Generalized abdominal pain: Secondary | ICD-10-CM | POA: Diagnosis present

## 2014-07-08 DIAGNOSIS — K219 Gastro-esophageal reflux disease without esophagitis: Secondary | ICD-10-CM | POA: Insufficient documentation

## 2014-07-08 DIAGNOSIS — R197 Diarrhea, unspecified: Secondary | ICD-10-CM

## 2014-07-08 DIAGNOSIS — R1010 Upper abdominal pain, unspecified: Secondary | ICD-10-CM

## 2014-07-08 LAB — LIPASE, BLOOD: Lipase: 33 U/L (ref 11–59)

## 2014-07-08 LAB — COMPREHENSIVE METABOLIC PANEL WITH GFR
ALT: 17 U/L (ref 0–35)
AST: 13 U/L (ref 0–37)
Albumin: 3.4 g/dL — ABNORMAL LOW (ref 3.5–5.2)
Alkaline Phosphatase: 57 U/L (ref 39–117)
Anion gap: 8 (ref 5–15)
BUN: 6 mg/dL (ref 6–23)
CO2: 25 mmol/L (ref 19–32)
Calcium: 8.9 mg/dL (ref 8.4–10.5)
Chloride: 105 meq/L (ref 96–112)
Creatinine, Ser: 0.58 mg/dL (ref 0.50–1.10)
GFR calc Af Amer: >90 mL/min
GFR calc non Af Amer: >90 mL/min
Glucose, Bld: 92 mg/dL (ref 70–99)
Potassium: 3.7 mmol/L (ref 3.5–5.1)
Sodium: 138 mmol/L (ref 135–145)
Total Bilirubin: 0.4 mg/dL (ref 0.3–1.2)
Total Protein: 7.1 g/dL (ref 6.0–8.3)

## 2014-07-08 LAB — URINALYSIS, ROUTINE W REFLEX MICROSCOPIC
Bilirubin Urine: NEGATIVE
GLUCOSE, UA: NEGATIVE mg/dL
Hgb urine dipstick: NEGATIVE
KETONES UR: 15 mg/dL — AB
LEUKOCYTES UA: NEGATIVE
Nitrite: NEGATIVE
PH: 5.5 (ref 5.0–8.0)
Protein, ur: NEGATIVE mg/dL
SPECIFIC GRAVITY, URINE: 1.025 (ref 1.005–1.030)
Urobilinogen, UA: 1 mg/dL (ref 0.0–1.0)

## 2014-07-08 LAB — CBC WITH DIFFERENTIAL/PLATELET
Basophils Absolute: 0 10*3/uL (ref 0.0–0.1)
Basophils Relative: 0 % (ref 0–1)
Eosinophils Absolute: 0.1 10*3/uL (ref 0.0–0.7)
Eosinophils Relative: 4 % (ref 0–5)
HEMATOCRIT: 38 % (ref 36.0–46.0)
Hemoglobin: 12.8 g/dL (ref 12.0–15.0)
LYMPHS PCT: 46 % (ref 12–46)
Lymphs Abs: 1.4 10*3/uL (ref 0.7–4.0)
MCH: 27.7 pg (ref 26.0–34.0)
MCHC: 33.7 g/dL (ref 30.0–36.0)
MCV: 82.3 fL (ref 78.0–100.0)
Monocytes Absolute: 0.4 10*3/uL (ref 0.1–1.0)
Monocytes Relative: 12 % (ref 3–12)
NEUTROS ABS: 1.1 10*3/uL — AB (ref 1.7–7.7)
NEUTROS PCT: 38 % — AB (ref 43–77)
Platelets: 180 10*3/uL (ref 150–400)
RBC: 4.62 MIL/uL (ref 3.87–5.11)
RDW: 12.9 % (ref 11.5–15.5)
WBC: 3 10*3/uL — AB (ref 4.0–10.5)

## 2014-07-08 LAB — I-STAT BETA HCG BLOOD, ED (MC, WL, AP ONLY): I-stat hCG, quantitative: <5 m[IU]/mL

## 2014-07-08 LAB — PREGNANCY, URINE: Preg Test, Ur: NEGATIVE

## 2014-07-08 MED ORDER — SODIUM CHLORIDE 0.9 % IV BOLUS (SEPSIS)
1000.0000 mL | Freq: Once | INTRAVENOUS | Status: AC
  Administered 2014-07-08: 1000 mL via INTRAVENOUS

## 2014-07-08 MED ORDER — IOHEXOL 300 MG/ML  SOLN
100.0000 mL | Freq: Once | INTRAMUSCULAR | Status: AC | PRN
  Administered 2014-07-08: 100 mL via INTRAVENOUS

## 2014-07-08 MED ORDER — IOHEXOL 300 MG/ML  SOLN: 25.0000 mL | Freq: Once | INTRAMUSCULAR | Status: DC | PRN

## 2014-07-08 NOTE — ED Provider Notes (Signed)
CSN: [id number]     Arrival date & time 07/08/14  1355 History   First MD Initiated Contact with Patient 07/08/14 1708     Chief Complaint  Patient presents with  . Abdominal Pain  . Diarrhea     (Consider location/radiation/quality/duration/timing/severity/associated sxs/prior Treatment) The history is provided by the patient. No language interpreter was used.  Ashley [name] is a 29 y/o F with PMHx of headaches, irregular menstrual cycles on Implanon, GERD, cholecystectomy performed in 10/2013 presenting to the ED with abdominal pain, nausea, and diarrhea that started Sunday morning. Patient reported that the discomfort is localized to the center of her abdomen described as a cramping, tightness sensation that is intermittent - worse after eating. Patient reported that she has been feeling nauseous, but denied vomiting. Stated that she has had at least more than 5 episodes of diarrhea - stated that the stools are watery without blood or mucus. Stated that she has been drinking gatorade. Patient reported that her Aunt had similar symptoms, but stated that she was not home when her Celine Ahrunt was sick. Stated that her LMP was beginning of Decemeber 2015 - patient currently has Implanon. Reported that she's been having a headache-reported that it was localized to the top of her head described as a dull aching pain-stated that she's been having headaches more frequently, stated that this is been ongoing for quite a while. Denied melena, hematochezia, fever, chills, chest pain, shortness of breath, difficulty breathing, vaginal discharge, vaginal bleeding, back pain, neck pain, neck stiffness, dysuria, hematuria, sudden onset of worst headache of life. PCP Dr. Daphine DeutscherMartin  Past Medical History  Diagnosis Date  . Headache(784.0)     3X WEEK  . 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 GERD (gastroesophageal reflux disease) 2012    diet controlled - no meds  . Irregular periods/menstrual cycles 01/30/2012  . Poor  social situation 01/30/2012  . Late prenatal care 01/30/2012   Past Surgical History  Procedure Laterality Date  . Cesarean section  2009  . Tonsillectomy  AGE 65  . Tonsillectomy and adenoidectomy  AGEB 18  . Cesarean section  01/30/2012    Procedure: CESAREAN SECTION;  Surgeon: Michael LitterNaima A Dillard, MD;  Location: [facility];  Service: Gynecology;  Laterality: N/A;  Repeat C/S  . Cholecystectomy N/A 10/16/2013    Procedure: LAPAROSCOPIC CHOLECYSTECTOMY WITH INTRAOPERATIVE CHOLANGIOGRAM;  Surgeon: Wilmon ArmsMatthew K. Corliss Skainssuei, MD;  Location: [facility];  Service: General;  Laterality: N/A;   Family History  Problem Relation Age of Onset  . Asthma Mother   . Hypertension Mother   . Early death Mother     AGE 31  . Cancer Maternal Grandmother    History  Substance Use Topics  . Smoking status: Current Some Day Smoker -- 0.50 packs/day for 6 years    Types: Cigarettes  . Smokeless tobacco: Never Used  . Alcohol Use: 1.5 oz/week    3 drink(s) per week     Comment: LIQUOR;  LAST ALCOLHOL last week   OB History    Gravida Para Term Preterm AB TAB SAB Ectopic Multiple Living   2 2 2       2       Obstetric Comments   NO PRENATAL CARE WITH FIRST PREGNANCY. 36-38 WKS  GEATATION.  "DID NOT KNOW SHE WAS PREGNANT UNTIL DELIVERY."     Review of Systems  Constitutional: Negative for fever and chills.  Respiratory: Negative for chest tightness and shortness of breath.   Cardiovascular: Negative  for chest pain.  Gastrointestinal: Positive for nausea, abdominal pain, diarrhea and blood in stool. Negative for vomiting, constipation and anal bleeding.  Genitourinary: Negative for dysuria, vaginal bleeding, vaginal discharge, vaginal pain and pelvic pain.  Musculoskeletal: Negative for back pain, neck pain and neck stiffness.  Neurological: Positive for headaches. Negative for dizziness.      Allergies  Review of patient's allergies indicates no known allergies.  Home Medications   Prior to Admission  medications   Not on File   BP 122/70 mmHg  Pulse 65  Temp(Src) 98.4 F (36.9 C) (Oral)  Resp 18  SpO2 100%  LMP 06/08/2014 Physical Exam  Constitutional: She is oriented to person, place, and time. She appears well-developed and well-nourished. No distress.  HENT:  Head: Normocephalic and atraumatic.  Mouth/Throat: Oropharynx is clear and moist. No oropharyngeal exudate.  Eyes: Conjunctivae and EOM are normal. Pupils are equal, round, and reactive to light. Right eye exhibits no discharge. Left eye exhibits no discharge.  Neck: Normal range of motion. Neck supple. No tracheal deviation present.  Cardiovascular: Normal rate, regular rhythm and normal heart sounds.  Exam reveals no friction rub.   No murmur heard. Pulmonary/Chest: Effort normal and breath sounds normal. No respiratory distress. She has no wheezes. She has no rales.  Abdominal: Soft. Bowel sounds are normal. She exhibits no distension. There is generalized tenderness. There is no rebound and no guarding.  Obese Bowel sounds normal active in all 4 quadrants Abdomen soft upon palpation Generalized tenderness upon palpation to the abdomen Negative peritoneal signs Negative guarding or rigidity noted  Musculoskeletal: Normal range of motion.  Lymphadenopathy:    She has no cervical adenopathy.  Neurological: She is alert and oriented to person, place, and time. No cranial nerve deficit. She exhibits normal muscle tone. Coordination normal.  Skin: Skin is warm and dry. No rash noted. She is not diaphoretic. No erythema.  Psychiatric: She has a normal mood and affect. Her behavior is normal. Thought content normal.  Nursing note and vitals reviewed.   ED Course  Procedures (including critical care time)  Results for orders placed or performed during the hospital encounter of 07/08/14  CBC with Differential  Result Value Ref Range   WBC 3.0 (L) 4.0 - 10.5 K/uL   RBC 4.62 3.87 - 5.11 MIL/uL   Hemoglobin 12.8 12.0 -  15.0 g/dL   HCT 16.1 09.6 - 04.5 %   MCV 82.3 78.0 - 100.0 fL   MCH 27.7 26.0 - 34.0 pg   MCHC 33.7 30.0 - 36.0 g/dL   RDW 40.9 81.1 - 91.4 %   Platelets 180 150 - 400 K/uL   Neutrophils Relative % 38 (L) 43 - 77 %   Neutro Abs 1.1 (L) 1.7 - 7.7 K/uL   Lymphocytes Relative 46 12 - 46 %   Lymphs Abs 1.4 0.7 - 4.0 K/uL   Monocytes Relative 12 3 - 12 %   Monocytes Absolute 0.4 0.1 - 1.0 K/uL   Eosinophils Relative 4 0 - 5 %   Eosinophils Absolute 0.1 0.0 - 0.7 K/uL   Basophils Relative 0 0 - 1 %   Basophils Absolute 0.0 0.0 - 0.1 K/uL  Comprehensive metabolic panel  Result Value Ref Range   Sodium 138 135 - 145 mmol/L   Potassium 3.7 3.5 - 5.1 mmol/L   Chloride 105 96 - 112 mEq/L   CO2 25 19 - 32 mmol/L   Glucose, Bld 92 70 - 99 mg/dL  BUN 6 6 - 23 mg/dL   Creatinine, Ser 9.60 0.50 - 1.10 mg/dL   Calcium 8.9 8.4 - 45.4 mg/dL   Total Protein 7.1 6.0 - 8.3 g/dL   Albumin 3.4 (L) 3.5 - 5.2 g/dL   AST 13 0 - 37 U/L   ALT 17 0 - 35 U/L   Alkaline Phosphatase 57 39 - 117 U/L   Total Bilirubin 0.4 0.3 - 1.2 mg/dL   GFR calc non Af Amer >90 >90 mL/min   GFR calc Af Amer >90 >90 mL/min   Anion gap 8 5 - 15  Pregnancy, urine  Result Value Ref Range   Preg Test, Ur NEGATIVE NEGATIVE  Urinalysis, Routine w reflex microscopic  Result Value Ref Range   Color, Urine YELLOW YELLOW   APPearance CLEAR CLEAR   Specific Gravity, Urine 1.025 1.005 - 1.030   pH 5.5 5.0 - 8.0   Glucose, UA NEGATIVE NEGATIVE mg/dL   Hgb urine dipstick NEGATIVE NEGATIVE   Bilirubin Urine NEGATIVE NEGATIVE   Ketones, ur 15 (A) NEGATIVE mg/dL   Protein, ur NEGATIVE NEGATIVE mg/dL   Urobilinogen, UA 1.0 0.0 - 1.0 mg/dL   Nitrite NEGATIVE NEGATIVE   Leukocytes, UA NEGATIVE NEGATIVE  Lipase, blood  Result Value Ref Range   Lipase 33 11 - 59 U/L  I-Stat Beta hCG blood, ED ([facility], [facility], [facility] only)  Result Value Ref Range   I-stat hCG, quantitative <5.0 <5 mIU/mL   Comment 3            Labs Review Labs Reviewed   CBC WITH DIFFERENTIAL - Abnormal; Notable for the following:    WBC 3.0 (*)    Neutrophils Relative % 38 (*)    Neutro Abs 1.1 (*)    All other components within normal limits  COMPREHENSIVE METABOLIC PANEL - Abnormal; Notable for the following:    Albumin 3.4 (*)    All other components within normal limits  URINALYSIS, ROUTINE W REFLEX MICROSCOPIC - Abnormal; Notable for the following:    Ketones, ur 15 (*)    All other components within normal limits  PREGNANCY, URINE  LIPASE, BLOOD  I-STAT BETA HCG BLOOD, ED ([facility], [facility], [facility] ONLY)    Imaging Review Ct Abdomen Pelvis W Contrast  07/08/2014   CLINICAL DATA:  Upper abdominal pain for 2 days,, history of cholecystectomy  EXAM: CT ABDOMEN AND PELVIS WITH CONTRAST  TECHNIQUE: Multidetector CT imaging of the abdomen and pelvis was performed using the standard protocol following bolus administration of intravenous contrast.  CONTRAST:  OMNIPAQUE IOHEXOL 300 MG/ML  SOLN  COMPARISON:  None.  FINDINGS: Gallbladder is been surgically removed.  The lung bases are clear.  The liver, spleen, adrenal glands and pancreas are all normal in their CT appearance. The kidneys are well visualized bilaterally without evidence of renal calculi or urinary tract obstructive change.  The appendix is well visualized and within normal limits. Some small scattered lymph nodes are noted within the right lower quadrant which are likely reactive in nature. None of these are significant by size criteria. No other significant lymphadenopathy is noted. No acute bony abnormality is noted.  IMPRESSION: Normal-appearing appendix.  Scattered lymph nodes likely reactive in nature although no contributory abnormality is noted.  No other focal abnormality is seen.   Electronically Signed   By: Alcide Clever M.D.   On: 07/08/2014 19:57     EKG Interpretation None      MDM   Final diagnoses:  Viral  gastroenteritis    Medications  iohexol (OMNIPAQUE) 300 MG/ML solution 25 mL  (not administered)  sodium chloride 0.9 % bolus 1,000 mL (0 mLs Intravenous Stopped 07/08/14 1927)  iohexol (OMNIPAQUE) 300 MG/ML solution 100 mL (100 mLs Intravenous Contrast Given 07/08/14 1931)   Filed Vitals:   07/08/14 1715 07/08/14 1730 07/08/14 1745 07/08/14 2056  BP: 134/76 122/89 126/75 122/70  Pulse: 61 77 67 65  Temp:    98.4 F (36.9 C)  TempSrc:    Oral  Resp:    18  SpO2: 99% 100% 100% 100%    CBC noted drop in white blood cell count from 12.4 to 3.0 over time span of 8 months. When compared to previous labs patient's baseline white blood cell count is 7.5-8.1. CMP unremarkable. Lipase negative elevation. I-STAT beta hCG less than 5.0. Urine pregnancy negative. Urinalysis negative for hemoglobin, nitrites, leukocytes. CT abdomen and pelvis with contrast noted normal appearing appendix, scattered lymph nodes likely reactive in nature although no intra-abdominal abnormalities noted. No acute abnormalities identified. Doubt UTI. Doubt pyelonephritis. Negative findings of appendicitis. Doubt pancreatitis. CT abdomen pelvis with contrast negative for acute abdominal processes. Suspicion to be viral gastroenteritis. Patient given IV fluids while in ED setting. Patient tolerated fluids by mouth without difficulty-negative episodes of emesis while in ED setting. Patient stable, afebrile. Patient not septic appearing. Discharged patient. Referred patient to PCP. Discussed with patient to rest and stay hydrated. Discussed with patient to closely monitor symptoms and if symptoms are to worsen or change to report back to the ED - strict return instructions given.  Patient agreed to plan of care, understood, all questions answered.   Raymon Mutton, PA-C 07/08/14 2103  Gerhard Munch, MD 07/08/14 [phone]

## 2014-07-08 NOTE — ED Notes (Signed)
Pt unable to give urine at this time.

## 2014-07-08 NOTE — ED Notes (Signed)
Pt tolerating oral fluids 

## 2014-07-08 NOTE — Discharge Instructions (Signed)
Please call your doctor for a followup appointment within 24-48 hours. When you talk to your doctor please let them know that you were seen in the emergency department and have them acquire all of your records so that they can discuss the findings with you and formulate a treatment plan to fully care for your new and ongoing problems. Please call and set-up an appointment with your primary care provider to be seen and assessed with an approximately 48 hours Please rest and stay hydrated Please avoid foods that are high in fat, grease Please drink plenty of fluids Please continue to monitor symptoms closely and if symptoms are to worsen or change (fever greater than 101, chills, sweating, nausea, vomiting, chest pain, shortness of breathe, difficulty breathing, weakness, numbness, tingling, worsening or changes to pain pattern, blood in stools, black tarry stools, dizziness, headaches, weakness, fainting, inability keep food or fluids down) please report back to the Emergency Department immediately.   Viral Gastroenteritis Viral gastroenteritis is also known as stomach flu. This condition affects the stomach and intestinal tract. It can cause sudden diarrhea and vomiting. The illness typically lasts 3 to 8 days. Most people develop an immune response that eventually gets rid of the virus. While this natural response develops, the virus can make you quite ill. CAUSES  Many different viruses can cause gastroenteritis, such as rotavirus or noroviruses. You can catch one of these viruses by consuming contaminated food or water. You may also catch a virus by sharing utensils or other personal items with an infected person or by touching a contaminated surface. SYMPTOMS  The most common symptoms are diarrhea and vomiting. These problems can cause a severe loss of body fluids (dehydration) and a body salt (electrolyte) imbalance. Other symptoms may include:  Fever.  Headache.  Fatigue.  Abdominal  pain. DIAGNOSIS  Your caregiver can usually diagnose viral gastroenteritis based on your symptoms and a physical exam. A stool sample may also be taken to test for the presence of viruses or other infections. TREATMENT  This illness typically goes away on its own. Treatments are aimed at rehydration. The most serious cases of viral gastroenteritis involve vomiting so severely that you are not able to keep fluids down. In these cases, fluids must be given through an intravenous line (IV). HOME CARE INSTRUCTIONS   Drink enough fluids to keep your urine clear or pale yellow. Drink small amounts of fluids frequently and increase the amounts as tolerated.  Ask your caregiver for specific rehydration instructions.  Avoid:  Foods high in sugar.  Alcohol.  Carbonated drinks.  Tobacco.  Juice.  Caffeine drinks.  Extremely hot or cold fluids.  Fatty, greasy foods.  Too much intake of anything at one time.  Dairy products until 24 to 48 hours after diarrhea stops.  You may consume probiotics. Probiotics are active cultures of beneficial bacteria. They may lessen the amount and number of diarrheal stools in adults. Probiotics can be found in yogurt with active cultures and in supplements.  Wash your hands well to avoid spreading the virus.  Only take over-the-counter or prescription medicines for pain, discomfort, or fever as directed by your caregiver. Do not give aspirin to children. Antidiarrheal medicines are not recommended.  Ask your caregiver if you should continue to take your regular prescribed and over-the-counter medicines.  Keep all follow-up appointments as directed by your caregiver. SEEK IMMEDIATE MEDICAL CARE IF:   You are unable to keep fluids down.  You do not urinate at least once  every 6 to 8 hours.  You develop shortness of breath.  You notice blood in your stool or vomit. This may look like coffee grounds.  You have abdominal pain that increases or is  concentrated in one small area (localized).  You have persistent vomiting or diarrhea.  You have a fever.  The patient is a child younger than 3 months, and he or she has a fever.  The patient is a child older than 3 months, and he or she has a fever and persistent symptoms.  The patient is a child older than 3 months, and he or she has a fever and symptoms suddenly get worse.  The patient is a baby, and he or she has no tears when crying. MAKE SURE YOU:   Understand these instructions.  Will watch your condition.  Will get help right away if you are not doing well or get worse. Document Released: 06/20/2005 Document Revised: 09/12/2011 Document Reviewed: 04/06/2011 [facility] Patient Information 2015 [place], [place]. This information is not intended to replace advice given to you by your health care provider. Make sure you discuss any questions you have with your health care provider.   Abdominal Pain Many things can cause abdominal pain. Usually, abdominal pain is not caused by a disease and will improve without treatment. It can often be observed and treated at home. Your health care provider will do a physical exam and possibly order blood tests and X-rays to help determine the seriousness of your pain. However, in many cases, more time must pass before a clear cause of the pain can be found. Before that point, your health care provider may not know if you need more testing or further treatment. HOME CARE INSTRUCTIONS  Monitor your abdominal pain for any changes. The following actions may help to alleviate any discomfort you are experiencing:  Only take over-the-counter or prescription medicines as directed by your health care provider.  Do not take laxatives unless directed to do so by your health care provider.  Try a clear liquid diet (broth, tea, or water) as directed by your health care provider. Slowly move to a bland diet as tolerated. SEEK MEDICAL CARE IF:  You have  unexplained abdominal pain.  You have abdominal pain associated with nausea or diarrhea.  You have pain when you urinate or have a bowel movement.  You experience abdominal pain that wakes you in the night.  You have abdominal pain that is worsened or improved by eating food.  You have abdominal pain that is worsened with eating fatty foods.  You have a fever. SEEK IMMEDIATE MEDICAL CARE IF:   Your pain does not go away within 2 hours.  You keep throwing up (vomiting).  Your pain is felt only in portions of the abdomen, such as the right side or the left lower portion of the abdomen.  You pass bloody or black tarry stools. MAKE SURE YOU:  Understand these instructions.   Will watch your condition.   Will get help right away if you are not doing well or get worse.  Document Released: 03/30/2005 Document Revised: 06/25/2013 Document Reviewed: 02/27/2013 [facility] Patient Information 2015 [place]ExitCare, [place]LL[place]. This information is not intended to replace advice given to you by your health care provider. Make sure you discuss any questions you have with your health care provider.

## 2014-07-08 NOTE — ED Notes (Signed)
Pt given fluid per [place] PA-C

## 2014-07-08 NOTE — ED Notes (Signed)
Pt here for abd pain and nausea with diarrhea since Sunday. No vomiting just nausea. Reports having a headache also.

## 2014-08-03 ENCOUNTER — Encounter (HOSPITAL_COMMUNITY): Payer: Self-pay | Admitting: Emergency Medicine

## 2014-08-03 ENCOUNTER — Emergency Department (HOSPITAL_COMMUNITY)
Admission: EM | Admit: 2014-08-03 | Discharge: 2014-08-03 | Disposition: A | Discharge status: Home / Self Care | Payer: Medicaid Other | Attending: Emergency Medicine | Admitting: Emergency Medicine

## 2014-08-03 DIAGNOSIS — Z609 Problem related to social environment, unspecified: Secondary | ICD-10-CM | POA: Diagnosis not present

## 2014-08-03 DIAGNOSIS — R5383 Other fatigue: Secondary | ICD-10-CM | POA: Insufficient documentation

## 2014-08-03 DIAGNOSIS — M549 Dorsalgia, unspecified: Secondary | ICD-10-CM | POA: Insufficient documentation

## 2014-08-03 DIAGNOSIS — Z8742 Personal history of other diseases of the female genital tract: Secondary | ICD-10-CM | POA: Diagnosis not present

## 2014-08-03 DIAGNOSIS — Z8619 Personal history of other infectious and parasitic diseases: Secondary | ICD-10-CM | POA: Insufficient documentation

## 2014-08-03 DIAGNOSIS — R1110 Vomiting, unspecified: Secondary | ICD-10-CM

## 2014-08-03 DIAGNOSIS — R109 Unspecified abdominal pain: Secondary | ICD-10-CM

## 2014-08-03 DIAGNOSIS — R6883 Chills (without fever): Secondary | ICD-10-CM | POA: Insufficient documentation

## 2014-08-03 DIAGNOSIS — Z3202 Encounter for pregnancy test, result negative: Secondary | ICD-10-CM | POA: Insufficient documentation

## 2014-08-03 DIAGNOSIS — Z720 Tobacco use: Secondary | ICD-10-CM | POA: Insufficient documentation

## 2014-08-03 DIAGNOSIS — R197 Diarrhea, unspecified: Secondary | ICD-10-CM | POA: Insufficient documentation

## 2014-08-03 DIAGNOSIS — R112 Nausea with vomiting, unspecified: Secondary | ICD-10-CM | POA: Diagnosis not present

## 2014-08-03 DIAGNOSIS — Z8719 Personal history of other diseases of the digestive system: Secondary | ICD-10-CM | POA: Diagnosis not present

## 2014-08-03 DIAGNOSIS — R1084 Generalized abdominal pain: Secondary | ICD-10-CM | POA: Diagnosis present

## 2014-08-03 LAB — URINALYSIS, ROUTINE W REFLEX MICROSCOPIC
Glucose, UA: NEGATIVE mg/dL
Ketones, ur: 40 mg/dL — AB
Leukocytes, UA: NEGATIVE
Nitrite: NEGATIVE
Protein, ur: NEGATIVE mg/dL
Specific Gravity, Urine: 1.035 — ABNORMAL HIGH (ref 1.005–1.030)
Urobilinogen, UA: 0.2 mg/dL (ref 0.0–1.0)
pH: 5.5 (ref 5.0–8.0)

## 2014-08-03 LAB — URINE MICROSCOPIC-ADD ON

## 2014-08-03 LAB — POC URINE PREG, ED: Preg Test, Ur: NEGATIVE

## 2014-08-03 MED ORDER — OXYCODONE-ACETAMINOPHEN 5-325 MG PO TABS
1.0000 | ORAL_TABLET | Freq: Once | ORAL | Status: AC
  Administered 2014-08-03: 1 via ORAL
  Filled 2014-08-03: qty 1

## 2014-08-03 MED ORDER — ONDANSETRON HCL 4 MG/2ML IJ SOLN
4.0000 mg | Freq: Once | INTRAMUSCULAR | Status: DC
Start: 2014-08-03 — End: 2014-08-03

## 2014-08-03 MED ORDER — ONDANSETRON 4 MG PO TBDP
8.0000 mg | ORAL_TABLET | Freq: Once | ORAL | Status: AC
  Administered 2014-08-03: 8 mg via ORAL
  Filled 2014-08-03: qty 2

## 2014-08-03 MED ORDER — IBUPROFEN 800 MG PO TABS
800.0000 mg | ORAL_TABLET | Freq: Once | ORAL | Status: AC
  Administered 2014-08-03: 800 mg via ORAL
  Filled 2014-08-03: qty 1

## 2014-08-03 NOTE — ED Provider Notes (Signed)
CSN: [id number]     Arrival date & time 08/03/14  1840 History   First MD Initiated Contact with Patient 08/03/14 1848     Chief Complaint  Patient presents with  . Abdominal Pain    Patient is a 29 y.o. female presenting with abdominal pain. The history is provided by the patient.  Abdominal Pain Pain location:  Generalized Pain quality: cramping   Pain radiates to:  Does not radiate Pain severity:  Mild Onset quality:  Gradual Duration:  1 day Timing:  Intermittent Progression:  Improving Chronicity:  New Relieved by:  Nothing Worsened by:  Nothing tried Associated symptoms: chills, diarrhea, fatigue, nausea and vomiting   Associated symptoms: no cough, no dysuria, no fever, no vaginal bleeding and no vaginal discharge   Patient presents with abdominal pain/nausea/vomiting/diarrhea today No sick contacts No blood is noted in vomit/stool   Past Medical History  Diagnosis Date  . Headache(784.0)     3X WEEK  . 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 GERD (gastroesophageal reflux disease) 2012    diet controlled - no meds  . Irregular periods/menstrual cycles 01/30/2012  . Poor social situation 01/30/2012  . Late prenatal care 01/30/2012   Past Surgical History  Procedure Laterality Date  . Cesarean section  2009  . Tonsillectomy  AGE 14  . Tonsillectomy and adenoidectomy  AGEB 18  . Cesarean section  01/30/2012    Procedure: CESAREAN SECTION;  Surgeon: Michael Litter, MD;  Location: [facility];  Service: Gynecology;  Laterality: N/A;  Repeat C/S  . Cholecystectomy N/A 10/16/2013    Procedure: LAPAROSCOPIC CHOLECYSTECTOMY WITH INTRAOPERATIVE CHOLANGIOGRAM;  Surgeon: Wilmon Arms. Corliss Skains, MD;  Location: [facility] OR;  Service: General;  Laterality: N/A;   Family History  Problem Relation Age of Onset  . Asthma Mother   . Hypertension Mother   . Early death Mother     AGE 62  . Cancer Maternal Grandmother    History  Substance Use Topics  . Smoking status: Current Some Day Smoker -- 0.50  packs/day for 6 years    Types: Cigarettes  . Smokeless tobacco: Never Used  . Alcohol Use: 1.5 oz/week    3 drink(s) per week     Comment: LIQUOR;  LAST ALCOLHOL last week   OB History    Gravida Para Term Preterm AB TAB SAB Ectopic Multiple Living   Obstetric Comments   NO PRENATAL CARE WITH FIRST PREGNANCY. 36-38 WKS  GEATATION.  "DID NOT KNOW SHE WAS PREGNANT UNTIL DELIVERY."     Review of Systems  Constitutional: Positive for chills and fatigue. Negative for fever.  Respiratory: Negative for cough.   Gastrointestinal: Positive for nausea, vomiting, abdominal pain and diarrhea.  Genitourinary: Negative for dysuria, vaginal bleeding and vaginal discharge.  Musculoskeletal: Positive for back pain.  All other systems reviewed and are negative.     Allergies  Review of patient's allergies indicates no known allergies.  Home Medications   Prior to Admission medications   Not on File   BP 131/77 mmHg  Pulse 105  Temp(Src) 97.9 F (36.6 C)  Resp 18  Ht  (1.575 m)  Wt 250 lb (113.399 kg)  BMI 45.71 kg/m2  SpO2 98%  LMP 06/08/2014 Physical Exam CONSTITUTIONAL: Well developed/well nourished HEAD: Normocephalic/atraumatic EYES: EOMI/PERRL ENMT: Mucous membranes moist NECK: supple no meningeal signs SPINE/BACK:diffuse low back tenderness, no bruising noted CV: S1/S2  noted, no murmurs/rubs/gallops noted LUNGS: Lungs are clear to auscultation bilaterally, no apparent distress ABDOMEN: soft, mild diffuse tenderness, no rebound or guarding, bowel sounds noted throughout abdomen GU:no cva tenderness NEURO: Pt is awake/alert/appropriate, moves all extremitiesx4.  No facial droop.   EXTREMITIES: pulses normal/equal, full ROM SKIN: warm, color normal PSYCH: no abnormalities of mood noted, alert and oriented to situation  ED Course  Procedures  Labs Review Labs Reviewed  URINALYSIS, ROUTINE W REFLEX MICROSCOPIC - Abnormal; Notable for the  following:    Color, Urine AMBER (*)    Specific Gravity, Urine 1.035 (*)    Hgb urine dipstick SMALL (*)    Bilirubin Urine SMALL (*)    Ketones, ur 40 (*)    All other components within normal limits  URINE MICROSCOPIC-ADD ON - Abnormal; Notable for the following:    Bacteria, UA FEW (*)    All other components within normal limits  POC URINE PREG, ED     Medications  ondansetron (ZOFRAN-ODT) disintegrating tablet 8 mg (8 mg Oral Given 08/03/14 1923)  oxyCODONE-acetaminophen (PERCOCET/ROXICET) 5-325 MG per tablet 1 tablet (1 tablet Oral Given 08/03/14 1932)  ibuprofen (ADVIL,MOTRIN) tablet 800 mg (800 mg Oral Given 08/03/14 2049)    8:58 PM Pt taking PO She is resting comfortably, talking on phone but she does reports back/hip pain and myalgias, likely due to viral illness She has been ambulatory Suspect viral Gastroenteritis due to both vomiting/diarrhea - I doubt acute abdominal emergency at this time   MDM   Final diagnoses:  Vomiting and diarrhea  Abdominal pain, unspecified abdominal location    Nursing notes including past medical history and social history reviewed and considered in documentation Labs/vital reviewed myself and considered during evaluation     Joya Gaskinsonald W Armond Cuthrell, MD 08/03/14 2059

## 2014-08-03 NOTE — ED Notes (Signed)
C/o nausea, vomiting, diarrhea, and mid abd pain since this morning.  Denies any urinary symptoms.

## 2014-08-28 ENCOUNTER — Emergency Department (HOSPITAL_COMMUNITY)
Admission: EM | Admit: 2014-08-28 | Discharge: 2014-08-28 | Disposition: A | Discharge status: Home / Self Care | Payer: Medicaid Other | Attending: Emergency Medicine | Admitting: Emergency Medicine

## 2014-08-28 ENCOUNTER — Encounter (HOSPITAL_COMMUNITY): Payer: Self-pay | Admitting: Family Medicine

## 2014-08-28 DIAGNOSIS — Z8619 Personal history of other infectious and parasitic diseases: Secondary | ICD-10-CM | POA: Diagnosis not present

## 2014-08-28 DIAGNOSIS — J029 Acute pharyngitis, unspecified: Secondary | ICD-10-CM | POA: Insufficient documentation

## 2014-08-28 DIAGNOSIS — Z720 Tobacco use: Secondary | ICD-10-CM | POA: Diagnosis not present

## 2014-08-28 DIAGNOSIS — Z8742 Personal history of other diseases of the female genital tract: Secondary | ICD-10-CM | POA: Diagnosis not present

## 2014-08-28 DIAGNOSIS — Z8719 Personal history of other diseases of the digestive system: Secondary | ICD-10-CM | POA: Insufficient documentation

## 2014-08-28 LAB — COMPREHENSIVE METABOLIC PANEL
ALBUMIN: 3.3 g/dL — AB (ref 3.5–5.2)
ALT: 18 U/L (ref 0–35)
AST: 20 U/L (ref 0–37)
Alkaline Phosphatase: 46 U/L (ref 39–117)
Anion gap: 13 (ref 5–15)
BILIRUBIN TOTAL: 0.8 mg/dL (ref 0.3–1.2)
BUN: 6 mg/dL (ref 6–23)
CHLORIDE: 102 mmol/L (ref 96–112)
CO2: 19 mmol/L (ref 19–32)
CREATININE: 0.67 mg/dL (ref 0.50–1.10)
Calcium: 8.8 mg/dL (ref 8.4–10.5)
GFR calc Af Amer: >90 mL/min (ref >90)
GFR calc non Af Amer: >90 mL/min (ref >90)
Glucose, Bld: 108 mg/dL — ABNORMAL HIGH (ref 70–99)
Potassium: 3.2 mmol/L — ABNORMAL LOW (ref 3.5–5.1)
Sodium: 134 mmol/L — ABNORMAL LOW (ref 135–145)
TOTAL PROTEIN: 7 g/dL (ref 6.0–8.3)

## 2014-08-28 LAB — CBC WITH DIFFERENTIAL/PLATELET
Basophils Absolute: 0 10*3/uL (ref 0.0–0.1)
Basophils Relative: 0 % (ref 0–1)
Eosinophils Absolute: 0.2 10*3/uL (ref 0.0–0.7)
Eosinophils Relative: 1 % (ref 0–5)
HCT: 37.7 % (ref 36.0–46.0)
HEMOGLOBIN: 13 g/dL (ref 12.0–15.0)
LYMPHS PCT: 10 % — AB (ref 12–46)
Lymphs Abs: 1.1 10*3/uL (ref 0.7–4.0)
MCH: 28.3 pg (ref 26.0–34.0)
MCHC: 34.5 g/dL (ref 30.0–36.0)
MCV: 82 fL (ref 78.0–100.0)
MONO ABS: 0.7 10*3/uL (ref 0.1–1.0)
MONOS PCT: 6 % (ref 3–12)
NEUTROS ABS: 9.3 10*3/uL — AB (ref 1.7–7.7)
Neutrophils Relative %: 83 % — ABNORMAL HIGH (ref 43–77)
Platelets: 185 10*3/uL (ref 150–400)
RBC: 4.6 MIL/uL (ref 3.87–5.11)
RDW: 13.3 % (ref 11.5–15.5)
WBC: 11.3 10*3/uL — AB (ref 4.0–10.5)

## 2014-08-28 LAB — RAPID STREP SCREEN (MED CTR MEBANE ONLY): STREPTOCOCCUS, GROUP A SCREEN (DIRECT): NEGATIVE

## 2014-08-28 MED ORDER — ACETAMINOPHEN 500 MG PO TABS: 500.0000 mg | ORAL_TABLET | Freq: Four times a day (QID) | ORAL | Status: DC | PRN

## 2014-08-28 MED ORDER — GUAIFENESIN-CODEINE 100-10 MG/5ML PO SOLN
5.0000 mL | Freq: Three times a day (TID) | ORAL | Status: DC | PRN
Start: 2014-08-28 — End: 2015-04-21

## 2014-08-28 MED ORDER — IBUPROFEN 600 MG PO TABS: 600.0000 mg | ORAL_TABLET | Freq: Four times a day (QID) | ORAL | Status: DC | PRN

## 2014-08-28 MED ORDER — DEXAMETHASONE 1 MG/ML PO CONC
10.0000 mg | Freq: Once | ORAL | Status: AC
  Administered 2014-08-28: 10 mg via ORAL
  Filled 2014-08-28: qty 10

## 2014-08-28 MED ORDER — ACETAMINOPHEN 160 MG/5ML PO SOLN
ORAL | Status: AC
  Administered 2014-08-28: 650 mg
  Filled 2014-08-28: qty 20.3

## 2014-08-28 MED ORDER — HYDROCODONE-HOMATROPINE 5-1.5 MG/5ML PO SYRP
5.0000 mL | ORAL_SOLUTION | Freq: Once | ORAL | Status: AC
  Administered 2014-08-28: 5 mL via ORAL
  Filled 2014-08-28: qty 5

## 2014-08-28 NOTE — ED Notes (Signed)
Gave patient turkey sandwich and soda.

## 2014-08-28 NOTE — ED Provider Notes (Signed)
CSN: [id number]     Arrival date & time 08/28/14  1933 History   First MD Initiated Contact with Patient 08/28/14 2132     Chief Complaint  Patient presents with  . Sore Throat     (Consider location/radiation/quality/duration/timing/severity/associated sxs/prior Treatment) HPI Comments: Patient is a 29 yo F PMHx significant for headaches, GERD presenting to the ED for a sore throat with sinus pressure, congestion, fever, chills, and headache that began last evening. Alleviating factors: none. Aggravating factors: eating, drinking. Medications tried prior to arrival: none. Patient's son is sick at home with hand foot and mouth syndrome. Denies any vomiting, diarrhea, abdominal pain, CP, SOB.     Patient is a 29 y.o. female presenting with pharyngitis.  Sore Throat Associated symptoms include chills, congestion, a fever and a sore throat. Pertinent negatives include no vomiting.    Past Medical History  Diagnosis Date  . Headache(784.0)     3X WEEK  . 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 GERD (gastroesophageal reflux disease) 2012    diet controlled - no meds  . Irregular periods/menstrual cycles 01/30/2012  . Poor social situation 01/30/2012  . Late prenatal care 01/30/2012   Past Surgical History  Procedure Laterality Date  . Cesarean section  2009  . Tonsillectomy  AGE 67  . Tonsillectomy and adenoidectomy  AGEB 18  . Cesarean section  01/30/2012    Procedure: CESAREAN SECTION;  Surgeon: Michael Litter, MD;  Location: [facility];  Service: Gynecology;  Laterality: N/A;  Repeat C/S  . Cholecystectomy N/A 10/16/2013    Procedure: LAPAROSCOPIC CHOLECYSTECTOMY WITH INTRAOPERATIVE CHOLANGIOGRAM;  Surgeon: Wilmon Arms. Corliss Skains, MD;  Location: [facility] OR;  Service: General;  Laterality: N/A;   Family History  Problem Relation Age of Onset  . Asthma Mother   . Hypertension Mother   . Early death Mother     AGE 29  . Cancer Maternal Grandmother    History  Substance Use Topics  . Smoking status:  Current Some Day Smoker -- 0.50 packs/day for 6 years    Types: Cigarettes  . Smokeless tobacco: Never Used  . Alcohol Use: 1.5 oz/week    3 drink(s) per week     Comment: LIQUOR;  LAST ALCOLHOL last week   OB History    Gravida Para Term Preterm AB TAB SAB Ectopic Multiple Living   Obstetric Comments   NO PRENATAL CARE WITH FIRST PREGNANCY. 36-38 WKS  GEATATION.  "DID NOT KNOW SHE WAS PREGNANT UNTIL DELIVERY."     Review of Systems  Constitutional: Positive for fever and chills.  HENT: Positive for congestion, rhinorrhea, sinus pressure and sore throat.   Gastrointestinal: Negative for vomiting and diarrhea.  All other systems reviewed and are negative.     Allergies  Review of patient's allergies indicates no known allergies.  Home Medications   Prior to Admission medications   Medication Sig Start Date End Date Taking? Authorizing Provider  acetaminophen (TYLENOL) 500 MG tablet Take 1 tablet (500 mg total) by mouth every 6 (six) hours as needed. 08/28/14   Cardelia Sassano L Masiah Lewing, PA-C  guaiFENesin-codeine 100-10 MG/5ML syrup Take 5 mLs by mouth 3 (three) times daily as needed for cough. 08/28/14   Jayleene Glaeser L Quandra Fedorchak, PA-C  ibuprofen (ADVIL,MOTRIN) 600 MG tablet Take 1 tablet (600 mg total) by mouth every 6 (six) hours as needed. 08/28/14   Jeannetta Ellis, PA-C  BP 147/87 mmHg  Pulse 101  Temp(Src) 98.5 F (36.9 C) (Oral)  Resp 18  SpO2 99% Physical Exam  Constitutional: She is oriented to person, place, and time. She appears well-developed and well-nourished. No distress.  HENT:  Head: Normocephalic and atraumatic.  Right Ear: Hearing, tympanic membrane, external ear and ear canal normal.  Left Ear: Hearing, tympanic membrane, external ear and ear canal normal.  Nose: Rhinorrhea present.  Mouth/Throat: Uvula is midline and mucous membranes are normal. Posterior oropharyngeal erythema present. No oropharyngeal exudate, posterior  oropharyngeal edema or tonsillar abscesses.  Eyes: Conjunctivae are normal.  Neck: Normal range of motion. Neck supple.  Cardiovascular: Normal rate, regular rhythm and normal heart sounds.   Pulmonary/Chest: Effort normal and breath sounds normal. No respiratory distress.  Abdominal: Soft.  Musculoskeletal: Normal range of motion.  Lymphadenopathy:    She has cervical adenopathy.  Neurological: She is alert and oriented to person, place, and time.  Skin: Skin is warm and dry. She is not diaphoretic.  Psychiatric: She has a normal mood and affect.  Nursing note and vitals reviewed.   ED Course  Procedures (including critical care time) Medications  acetaminophen (TYLENOL) 160 MG/5ML solution (650 mg  Given 08/28/14 1954)  HYDROcodone-homatropine (HYCODAN) 5-1.5 MG/5ML syrup 5 mL (5 mLs Oral Given 08/28/14 2153)  dexamethasone (DECADRON) 1 MG/ML solution 10 mg (10 mg Oral Given 08/28/14 2209)    Labs Review Labs Reviewed  CBC WITH DIFFERENTIAL/PLATELET - Abnormal; Notable for the following:    WBC 11.3 (*)    Neutrophils Relative % 83 (*)    Neutro Abs 9.3 (*)    Lymphocytes Relative 10 (*)    All other components within normal limits  COMPREHENSIVE METABOLIC PANEL - Abnormal; Notable for the following:    Sodium 134 (*)    Potassium 3.2 (*)    Glucose, Bld 108 (*)    Albumin 3.3 (*)    All other components within normal limits  RAPID STREP SCREEN  CULTURE, GROUP A STREP    Imaging Review No results found.   EKG Interpretation None      MDM   Final diagnoses:  Viral pharyngitis    Filed Vitals:   08/28/14 2150  BP: 147/87  Pulse: 101  Temp: 98.5 F (36.9 C)  Resp: 18   Fever improved with antipyretic administration, tachycardia likely secondary to fever also improved with fever reduction. Pt without tonsillar exudate, negative strep. Presents with mild cervical lymphadenopathy, & dysphagia; diagnosis of viral pharyngitis. No abx indicated. DC w symptomatic  tx for pain  Pt does not appear dehydrated, but did discuss importance of water rehydration. Presentation non concerning for PTA or infxn spread to soft tissue. No trismus or uvula deviation. Specific return precautions discussed. Pt able to drink water in ED without difficulty with intact air way. Recommended PCP follow up. Patient is stable at time of discharge      Jeannetta EllisJennifer L Santita Hunsberger, PA-C 08/28/14 2227  Elwin MochaBlair Walden, MD 08/28/14 [id number]

## 2014-08-28 NOTE — ED Notes (Signed)
Pt here for sore throat since last night. Pt sts body aches and fever.

## 2014-08-28 NOTE — Discharge Instructions (Signed)
Please follow up with your primary care physician in 1-2 days. If you do not have one [facility]. Please take pain medication and/or muscle relaxants as prescribed and as needed for pain. Please do not drive on narcotic pain medication or on muscle relaxants. Please alternate between Motrin and Tylenol every three hours for fevers and pain. Please read all discharge instructions and return precautions.   Pharyngitis Pharyngitis is redness, pain, and swelling (inflammation) of your pharynx.  CAUSES  Pharyngitis is usually caused by infection. Most of the time, these infections are from viruses (viral) and are part of a cold. However, sometimes pharyngitis is caused by bacteria (bacterial). Pharyngitis can also be caused by allergies. Viral pharyngitis may be spread from person to person by coughing, sneezing, and personal items or utensils (cups, forks, spoons, toothbrushes). Bacterial pharyngitis may be spread from person to person by more intimate contact, such as kissing.  SIGNS AND SYMPTOMS  Symptoms of pharyngitis include:   Sore throat.   Tiredness (fatigue).   Low-grade fever.   Headache.  Joint pain and muscle aches.  Skin rashes.  Swollen lymph nodes.  Plaque-like film on throat or tonsils (often seen with bacterial pharyngitis). DIAGNOSIS  Your health care provider will ask you questions about your illness and your symptoms. Your medical history, along with a physical exam, is often all that is needed to diagnose pharyngitis. Sometimes, a rapid strep test is done. Other lab tests may also be done, depending on the suspected cause.  TREATMENT  Viral pharyngitis will usually get better in 3-4 days without the use of medicine. Bacterial pharyngitis is treated with medicines that kill germs (antibiotics).  HOME CARE INSTRUCTIONS   Drink enough water and fluids to keep your urine clear or pale yellow.   Only take  over-the-counter or prescription medicines as directed by your health care provider:   If you are prescribed antibiotics, make sure you finish them even if you start to feel better.   Do not take aspirin.   Get lots of rest.   Gargle with 8 oz of salt water ( tsp of salt per 1 qt of water) as often as every 1-2 hours to soothe your throat.   Throat lozenges (if you are not at risk for choking) or sprays may be used to soothe your throat. SEEK MEDICAL CARE IF:   You have large, tender lumps in your neck.  You have a rash.  You cough up green, yellow-brown, or bloody spit. SEEK IMMEDIATE MEDICAL CARE IF:   Your neck becomes stiff.  You drool or are unable to swallow liquids.  You vomit or are unable to keep medicines or liquids down.  You have severe pain that does not go away with the use of recommended medicines.  You have trouble breathing (not caused by a stuffy nose). MAKE SURE YOU:   Understand these instructions.  Will watch your condition.  Will get help right away if you are not doing well or get worse. Document Released: 06/20/2005 Document Revised: 04/10/2013 Document Reviewed: 02/25/2013 [facility] Patient Information 2015 [place]Care, [place]LL[place]. This information is not intended to replace advice given to you by your health care provider. Make sure you discuss any questions you have with your health care provider.

## 2014-08-31 LAB — CULTURE, GROUP A STREP: Strep A Culture: NEGATIVE

## 2014-11-23 ENCOUNTER — Encounter (HOSPITAL_COMMUNITY): Payer: Self-pay | Admitting: Physical Medicine and Rehabilitation

## 2014-11-23 ENCOUNTER — Emergency Department (HOSPITAL_COMMUNITY)
Admission: EM | Admit: 2014-11-23 | Discharge: 2014-11-23 | Disposition: A | Discharge status: Home / Self Care | Payer: Medicaid Other | Attending: Emergency Medicine | Admitting: Emergency Medicine

## 2014-11-23 DIAGNOSIS — Z3202 Encounter for pregnancy test, result negative: Secondary | ICD-10-CM | POA: Insufficient documentation

## 2014-11-23 DIAGNOSIS — Z720 Tobacco use: Secondary | ICD-10-CM | POA: Insufficient documentation

## 2014-11-23 DIAGNOSIS — M545 Low back pain, unspecified: Secondary | ICD-10-CM

## 2014-11-23 DIAGNOSIS — Z8619 Personal history of other infectious and parasitic diseases: Secondary | ICD-10-CM | POA: Insufficient documentation

## 2014-11-23 DIAGNOSIS — Z8719 Personal history of other diseases of the digestive system: Secondary | ICD-10-CM | POA: Diagnosis not present

## 2014-11-23 DIAGNOSIS — E669 Obesity, unspecified: Secondary | ICD-10-CM | POA: Diagnosis not present

## 2014-11-23 DIAGNOSIS — Z8742 Personal history of other diseases of the female genital tract: Secondary | ICD-10-CM | POA: Diagnosis not present

## 2014-11-23 LAB — POC URINE PREG, ED: PREG TEST UR: NEGATIVE

## 2014-11-23 MED ORDER — NAPROXEN 500 MG PO TABS: 500.0000 mg | ORAL_TABLET | Freq: Two times a day (BID) | ORAL | Status: DC

## 2014-11-23 MED ORDER — CYCLOBENZAPRINE HCL 10 MG PO TABS: 10.0000 mg | ORAL_TABLET | Freq: Three times a day (TID) | ORAL | Status: DC | PRN

## 2014-11-23 NOTE — ED Notes (Signed)
Pt presents to department for evaluation of lower back pain. Ongoing x1 week. Denies urinary symptoms. 10/10 pain upon arrival to ED.

## 2014-11-23 NOTE — ED Provider Notes (Signed)
CSN: [id number]     Arrival date & time 11/23/14  1342 History  This chart was scribed for non-physician practitioner, Trixie DredgeEmily Tinisha Etzkorn, PA-C, working with Richardean Canalavid H Yao, MD, by Ronney LionSuzanne Le, ED Scribe. This patient was seen in room TR08C/TR08C and the patient's care was started at 2:35 PM.    Chief Complaint  Patient presents with  . Back Pain   The history is provided by the patient. No language interpreter was used.    HPI Comments: Ernest HaberLamyre S Musick is a 29 y.o. female who presents to the Emergency Department complaining of an episode of lower back pain that has been ongoing for 1 week. She states she has intermittent episodes of aching, throbbing, lower back pain, sometimes lasting for 3 weeks at a time, ongoing since 6 months ago. 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 Movement exacerbates the pain. She has tried Advil with no relief. She denies abdominal pain, urinary symptoms, abnormal vaginal discharge or bleeding, constipation, diarrhea, blood in her stool, fever, myalgias, bowel or bladder incontinence, extremity weakness or numbness. She denies a history of CA or IVDA. Patient has had an Implanon for 3 years. She states she just started sleeping on a hard mattress from her grandfather about 2 days ago.  [facility]  Past Medical History  Diagnosis Date  . Headache(784.0)     3X WEEK  . 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 GERD (gastroesophageal reflux disease) 2012    diet controlled - no meds  . Irregular periods/menstrual cycles 01/30/2012  . Poor social situation 01/30/2012  . Late prenatal care 01/30/2012   Past Surgical History  Procedure Laterality Date  . Cesarean section  2009  . Tonsillectomy  AGE 42  . Tonsillectomy and adenoidectomy  AGEB 18  . Cesarean section  01/30/2012    Procedure: CESAREAN SECTION;  Surgeon: Michael LitterNaima A Dillard, MD;  Location: [facility] ORS;  Service: Gynecology;  Laterality: N/A;  Repeat C/S  . Cholecystectomy N/A 10/16/2013   Procedure: LAPAROSCOPIC CHOLECYSTECTOMY WITH INTRAOPERATIVE CHOLANGIOGRAM;  Surgeon: Wilmon ArmsMatthew K. Corliss Skainssuei, MD;  Location: [facility] OR;  Service: General;  Laterality: N/A;   Family History  Problem Relation Age of Onset  . Asthma Mother   . Hypertension Mother   . Early death Mother     AGE 31  . Cancer Maternal Grandmother    History  Substance Use Topics  . Smoking status: Current Some Day Smoker -- 0.50 packs/day for 6 years    Types: Cigarettes  . Smokeless tobacco: Never Used  . Alcohol Use: 1.5 oz/week    3 Standard drinks or equivalent per week   OB History    Gravida Para Term Preterm AB TAB SAB Ectopic Multiple Living   2 2 2       2       Obstetric Comments   NO PRENATAL CARE WITH FIRST PREGNANCY. 36-38 WKS  GEATATION.  "DID NOT KNOW SHE WAS PREGNANT UNTIL DELIVERY."     Review of Systems  Constitutional: Negative for fever and chills.  Gastrointestinal: Negative for nausea, vomiting, abdominal pain and diarrhea.  Genitourinary: Negative for dysuria, urgency, frequency, vaginal bleeding and vaginal discharge.  Musculoskeletal: Positive for back pain. Negative for gait problem.  Skin: Negative for color change and wound.  Allergic/Immunologic: Negative for immunocompromised state.  Neurological: Negative for weakness and numbness.  Psychiatric/Behavioral: Negative for self-injury.      Allergies  Review of patient's allergies indicates no known allergies.  Home Medications   Prior to Admission medications   Medication Sig Start Date End Date Taking? Authorizing Provider  acetaminophen (TYLENOL) 500 MG tablet Take 1 tablet (500 mg total) by mouth every 6 (six) hours as needed. 08/28/14   Jennifer Piepenbrink, PA-C  guaiFENesin-codeine 100-10 MG/5ML syrup Take 5 mLs by mouth 3 (three) times daily as needed for cough. 08/28/14   Jennifer Piepenbrink, PA-C  ibuprofen (ADVIL,MOTRIN) 600 MG tablet Take 1 tablet (600 mg total) by mouth every 6 (six) hours as needed. 08/28/14    Jennifer Piepenbrink, PA-C   BP 107/60 mmHg  Pulse 78  Temp(Src) 98.7 F (37.1 C) (Oral)  Resp 18  SpO2 97% Physical Exam  Constitutional: She appears well-developed and well-nourished. No distress.  Obese.  HENT:  Head: Normocephalic and atraumatic.  Neck: Neck supple.  Pulmonary/Chest: Effort normal.  Abdominal: Soft. She exhibits no distension and no mass. There is no tenderness. There is no rebound and no guarding.  Musculoskeletal: She exhibits tenderness.  Mild diffuse tenderness of the lower back. Gait normal.   Spine nontender, no crepitus, or stepoffs. Lower extremities:  Strength 5/5, sensation intact, distal pulses intact.     Neurological: She is alert. She exhibits normal muscle tone.  Skin: She is not diaphoretic.  Psychiatric: She has a normal mood and affect. Her behavior is normal.  Nursing note and vitals reviewed.   ED Course  Procedures (including critical care time)  DIAGNOSTIC STUDIES: Oxygen Saturation is 97% on RA, normal by my interpretation.    COORDINATION OF CARE: 2:39 PM - Discussed treatment plan with pt at bedside which includes analgesic medications and f/u with PCP as needed, and pt agreed to plan.   Meds ordered this encounter  Medications  . naproxen (NAPROSYN) 500 MG tablet    Sig: Take 1 tablet (500 mg total) by mouth 2 (two) times daily.    Dispense:  30 tablet    Refill:  0    Order Specific Question:  Supervising Provider    Answer:  MILLER, BRIAN [[id number]]  . cyclobenzaprine (FLEXERIL) 10 MG tablet    Sig: Take 1 tablet (10 mg total) by mouth 3 (three) times daily as needed for muscle spasms.    Dispense:  12 tablet    Refill:  0    Order Specific Question:  Supervising Provider    Answer:  Eber Hong [[id number]]     MDM   Final diagnoses:  Bilateral low back pain without sciatica   Afebrile, nontoxic patient with mechanical low back pain. No red flags. Pregnancy test negative. Discussed result, findings, treatment, and  follow up  with patient.  Pt given return precautions.  Pt verbalizes understanding and agrees with plan.       I personally performed the services described in this documentation, which was scribed in my presence. The recorded information has been reviewed and is accurate.     Trixie Dredge, PA-C 11/23/14 1526  Richardean Canal, MD 11/24/14 [phone]

## 2014-11-23 NOTE — ED Notes (Signed)
Pt requested a pregnancy at time of d/c.

## 2014-11-23 NOTE — Discharge Instructions (Signed)
Read the information below.  Use the prescribed medication as directed.  Please discuss all new medications with your pharmacist.  You may return to the Emergency Department at any time for worsening condition or any new symptoms that concern you.   If you develop fevers, loss of control of bowel or bladder, weakness or numbness in your legs, or are unable to walk, return to the ER for a recheck.    Back Exercises Back exercises help treat and prevent back injuries. The goal of back exercises is to increase the strength of your abdominal and back muscles and the flexibility of your back. These exercises should be started when you no longer have back pain. Back exercises include:  Pelvic Tilt. Lie on your back with your knees bent. Tilt your pelvis until the lower part of your back is against the floor. Hold this position 5 to 10 sec and repeat 5 to 10 times.  Knee to Chest. Pull first 1 knee up against your chest and hold for 20 to 30 seconds, repeat this with the other knee, and then both knees. This may be done with the other leg straight or bent, whichever feels better.  Sit-Ups or Curl-Ups. Bend your knees 90 degrees. Start with tilting your pelvis, and do a partial, slow sit-up, lifting your trunk only 30 to 45 degrees off the floor. Take at least 2 to 3 seconds for each sit-up. Do not do sit-ups with your knees out straight. If partial sit-ups are difficult, simply do the above but with only tightening your abdominal muscles and holding it as directed.  Hip-Lift. Lie on your back with your knees flexed 90 degrees. Push down with your feet and shoulders as you raise your hips a couple inches off the floor; hold for 10 seconds, repeat 5 to 10 times.  Back arches. Lie on your stomach, propping yourself up on bent elbows. Slowly press on your hands, causing an arch in your low back. Repeat 3 to 5 times. Any initial stiffness and discomfort should lessen with repetition over time.  Shoulder-Lifts.  Lie face down with arms beside your body. Keep hips and torso pressed to floor as you slowly lift your head and shoulders off the floor. Do not overdo your exercises, especially in the beginning. Exercises may cause you some mild back discomfort which lasts for a few minutes; however, if the pain is more severe, or lasts for more than 15 minutes, do not continue exercises until you see your caregiver. Improvement with exercise therapy for back problems is slow.  See your caregivers for assistance with developing a proper back exercise program. Document Released: 07/28/2004 Document Revised: 09/12/2011 Document Reviewed: 04/21/2011 [facility] Patient Information 2015 [place], [place]. This information is not intended to replace advice given to you by your health care provider. Make sure you discuss any questions you have with your health care provider.  Back Injury Prevention Back injuries can be extremely painful and difficult to heal. After having one back injury, you are much more likely to experience another later on. It is important to learn how to avoid injuring or re-injuring your back. The following tips can help you to prevent a back injury. PHYSICAL FITNESS  Exercise regularly and try to develop good tone in your abdominal muscles. Your abdominal muscles provide a lot of the support needed by your back.  Do aerobic exercises (walking, jogging, biking, swimming) regularly.  Do exercises that increase balance and strength (tai chi, yoga) regularly. This can decrease  your risk of falling and injuring your back.  Stretch before and after exercising.  Maintain a healthy weight. The more you weigh, the more stress is placed on your back. For every pound of weight, 10 times that amount of pressure is placed on the back. DIET  Talk to your caregiver about how much calcium and vitamin D you need per day. These nutrients help to prevent weakening of the bones (osteoporosis). Osteoporosis can cause  broken (fractured) bones that lead to back pain.  Include good sources of calcium in your diet, such as dairy products, green, leafy vegetables, and products with calcium added (fortified).  Include good sources of vitamin D in your diet, such as milk and foods that are fortified with vitamin D.  Consider taking a nutritional supplement or a multivitamin if needed.  Stop smoking if you smoke. POSTURE  Sit and stand up straight. Avoid leaning forward when you sit or hunching over when you stand.  Choose chairs with good low back (lumbar) support.  If you work at a desk, sit close to your work so you do not need to lean over. Keep your chin tucked in. Keep your neck drawn back and elbows bent at a right angle. Your arms should look like the letter "L."  Sit high and close to the steering wheel when you drive. Add a lumbar support to your car seat if needed.  Avoid sitting or standing in one position for too long. Take breaks to get up, stretch, and walk around at least once every hour. Take breaks if you are driving for long periods of time.  Sleep on your side with your knees slightly bent, or sleep on your back with a pillow under your knees. Do not sleep on your stomach. LIFTING, TWISTING, AND REACHING  Avoid heavy lifting, especially repetitive lifting. If you must do heavy lifting:  Stretch before lifting.  Work slowly.  Rest between lifts.  Use carts and dollies to move objects when possible.  Make several small trips instead of carrying 1 heavy load.  Ask for help when you need it.  Ask for help when moving big, awkward objects.  Follow these steps when lifting:  Stand with your feet shoulder-width apart.  Get as close to the object as you can. Do not try to pick up heavy objects that are far from your body.  Use handles or lifting straps if they are available.  Bend at your knees. Squat down, but keep your heels off the floor.  Keep your shoulders pulled back,  your chin tucked in, and your back straight.  Lift the object slowly, tightening the muscles in your legs, abdomen, and buttocks. Keep the object as close to the center of your body as possible.  When you put a load down, use these same guidelines in reverse.  Do not:  Lift the object above your waist.  Twist at the waist while lifting or carrying a load. Move your feet if you need to turn, not your waist.  Bend over without bending at your knees.  Avoid reaching over your head, across a table, or for an object on a high surface. OTHER TIPS  Avoid wet floors and keep sidewalks clear of ice to prevent falls.  Do not sleep on a mattress that is too soft or too hard.  Keep items that are used frequently within easy reach.  Put heavier objects on shelves at waist level and lighter objects on lower or higher shelves.  Find ways to decrease your stress, such as exercise, massage, or relaxation techniques. Stress can build up in your muscles. Tense muscles are more vulnerable to injury.  Seek treatment for depression or anxiety if needed. These conditions can increase your risk of developing back pain. SEEK MEDICAL CARE IF:  You injure your back.  You have questions about diet, exercise, or other ways to prevent back injuries. MAKE SURE YOU:  Understand these instructions.  Will watch your condition.  Will get help right away if you are not doing well or get worse. Document Released: 07/28/2004 Document Revised: 09/12/2011 Document Reviewed: 08/01/2011 [facility] Patient Information 2015 [place], [place]. This information is not intended to replace advice given to you by your health care provider. Make sure you discuss any questions you have with your health care provider.  Back Pain, Adult Low back pain is very common. About 1 in 5 people have back pain.The cause of low back pain is rarely dangerous. The pain often gets better over time.About half of people with a sudden onset of  back pain feel better in just 2 weeks. About 8 in 10 people feel better by 6 weeks.  CAUSES Some common causes of back pain include:  Strain of the muscles or ligaments supporting the spine.  Wear and tear (degeneration) of the spinal discs.  Arthritis.  Direct injury to the back. DIAGNOSIS Most of the time, the direct cause of low back pain is not known.However, back pain can be treated effectively even when the exact cause of the pain is unknown.Answering your caregiver's questions about your overall health and symptoms is one of the most accurate ways to make sure the cause of your pain is not dangerous. If your caregiver needs more information, he or she may order lab work or imaging tests (X-rays or MRIs).However, even if imaging tests show changes in your back, this usually does not require surgery. HOME CARE INSTRUCTIONS For many people, back pain returns.Since low back pain is rarely dangerous, it is often a condition that people can learn to [facility] their own.   Remain active. It is stressful on the back to sit or stand in one place. Do not sit, drive, or stand in one place for more than 30 minutes at a time. Take short walks on level surfaces as soon as pain allows.Try to increase the length of time you walk each day.  Do not stay in bed.Resting more than 1 or 2 days can delay your recovery.  Do not avoid exercise or work.Your body is made to move.It is not dangerous to be active, even though your back may hurt.Your back will likely heal faster if you return to being active before your pain is gone.  Pay attention to your body when you bend and lift. Many people have less discomfortwhen lifting if they bend their knees, keep the load close to their bodies,and avoid twisting. Often, the most comfortable positions are those that put less stress on your recovering back.  Find a comfortable position to sleep. Use a firm mattress and lie on your side with your knees  slightly bent. If you lie on your back, put a pillow under your knees.  Only take over-the-counter or prescription medicines as directed by your caregiver. Over-the-counter medicines to reduce pain and inflammation are often the most helpful.Your caregiver may prescribe muscle relaxant drugs.These medicines help dull your pain so you can more quickly return to your normal activities and healthy exercise.  Put ice on the  injured area.  Put ice in a plastic bag.  Place a towel between your skin and the bag.  Leave the ice on for 15-20 minutes, 03-04 times a day for the first 2 to 3 days. After that, ice and heat may be alternated to reduce pain and spasms.  Ask your caregiver about trying back exercises and gentle massage. This may be of some benefit.  Avoid feeling anxious or stressed.Stress increases muscle tension and can worsen back pain.It is important to recognize when you are anxious or stressed and learn ways to manage it.Exercise is a great option. SEEK MEDICAL CARE IF:  You have pain that is not relieved with rest or medicine.  You have pain that does not improve in 1 week.  You have new symptoms.  You are generally not feeling well. SEEK IMMEDIATE MEDICAL CARE IF:   You have pain that radiates from your back into your legs.  You develop new bowel or bladder control problems.  You have unusual weakness or numbness in your arms or legs.  You develop nausea or vomiting.  You develop abdominal pain.  You feel faint. Document Released: 06/20/2005 Document Revised: 12/20/2011 Document Reviewed: 10/22/2013 [facility] Patient Information 2015 [place], [place]. This information is not intended to replace advice given to you by your health care provider. Make sure you discuss any questions you have with your health care provider.

## 2014-11-23 NOTE — ED Notes (Signed)
Declined W/C at D/C and was escorted to lobby by RN. 

## 2015-01-19 ENCOUNTER — Telehealth: Payer: Self-pay | Admitting: *Deleted

## 2015-01-19 NOTE — Telephone Encounter (Signed)
Patient referred to our office for a Nexplanon removal and re-insertion. According to her records the Nexplanon is good until 03/22/15. Patient has been scheduled for 03-19-15 @ 1:30 pm. Patient advised to contact the office is anything changes. Patient verbalized understanding.

## 2015-02-02 ENCOUNTER — Emergency Department (HOSPITAL_COMMUNITY)
Admission: EM | Admit: 2015-02-02 | Discharge: 2015-02-02 | Disposition: A | Discharge status: Home / Self Care | Payer: Medicaid Other | Attending: Emergency Medicine | Admitting: Emergency Medicine

## 2015-02-02 ENCOUNTER — Encounter (HOSPITAL_COMMUNITY): Payer: Self-pay

## 2015-02-02 DIAGNOSIS — Z8742 Personal history of other diseases of the female genital tract: Secondary | ICD-10-CM | POA: Diagnosis not present

## 2015-02-02 DIAGNOSIS — Z720 Tobacco use: Secondary | ICD-10-CM | POA: Diagnosis not present

## 2015-02-02 DIAGNOSIS — R51 Headache: Secondary | ICD-10-CM | POA: Diagnosis present

## 2015-02-02 DIAGNOSIS — Z8619 Personal history of other infectious and parasitic diseases: Secondary | ICD-10-CM | POA: Diagnosis not present

## 2015-02-02 DIAGNOSIS — Z791 Long term (current) use of non-steroidal anti-inflammatories (NSAID): Secondary | ICD-10-CM | POA: Diagnosis not present

## 2015-02-02 DIAGNOSIS — G43809 Other migraine, not intractable, without status migrainosus: Secondary | ICD-10-CM | POA: Insufficient documentation

## 2015-02-02 DIAGNOSIS — Z8719 Personal history of other diseases of the digestive system: Secondary | ICD-10-CM | POA: Insufficient documentation

## 2015-02-02 MED ORDER — MAGNESIUM SULFATE IN D5W 10-5 MG/ML-% IV SOLN
1.0000 g | Freq: Once | INTRAVENOUS | Status: AC
  Administered 2015-02-02: 1 g via INTRAVENOUS
  Filled 2015-02-02: qty 100

## 2015-02-02 MED ORDER — SODIUM CHLORIDE 0.9 % IV BOLUS (SEPSIS)
1000.0000 mL | Freq: Once | INTRAVENOUS | Status: AC
  Administered 2015-02-02: 1000 mL via INTRAVENOUS

## 2015-02-02 MED ORDER — DIPHENHYDRAMINE HCL 50 MG/ML IJ SOLN
25.0000 mg | Freq: Once | INTRAMUSCULAR | Status: AC
  Administered 2015-02-02: 25 mg via INTRAVENOUS
  Filled 2015-02-02: qty 1

## 2015-02-02 MED ORDER — METOCLOPRAMIDE HCL 5 MG/ML IJ SOLN
10.0000 mg | Freq: Once | INTRAMUSCULAR | Status: AC
  Administered 2015-02-02: 10 mg via INTRAVENOUS
  Filled 2015-02-02: qty 2

## 2015-02-02 MED ORDER — KETOROLAC TROMETHAMINE 30 MG/ML IJ SOLN
30.0000 mg | Freq: Once | INTRAMUSCULAR | Status: AC
  Administered 2015-02-02: 30 mg via INTRAVENOUS
  Filled 2015-02-02: qty 1

## 2015-02-02 MED ORDER — DEXAMETHASONE SODIUM PHOSPHATE 10 MG/ML IJ SOLN
10.0000 mg | Freq: Once | INTRAMUSCULAR | Status: AC
  Administered 2015-02-02: 10 mg via INTRAVENOUS
  Filled 2015-02-02: qty 1

## 2015-02-02 NOTE — Discharge Instructions (Signed)

## 2015-02-02 NOTE — ED Notes (Signed)
Pt. Presents with complaint of dizziness and HA starting yesterday. Pt. States she took a nap and felt better but still had HA. Pt. States dizziness was back this AM. Decline room spinning but states she feels off balance. Pt. Drove herself here, pt. With steady gait and ambulating independently.

## 2015-02-02 NOTE — ED Provider Notes (Signed)
CSN: [id number]     Arrival date & time 02/02/15  0818 History   First MD Initiated Contact with Patient 02/02/15 0820     Chief Complaint  Patient presents with  . Dizziness  . Headache     (Consider location/radiation/quality/duration/timing/severity/associated sxs/prior Treatment) Patient is a 29 y.o. female presenting with dizziness and headaches. The history is provided by the patient.  Dizziness Quality:  Head spinning Severity:  Mild Onset quality:  Gradual Duration:  1 day Timing:  Constant Progression:  Unchanged Chronicity:  Recurrent Associated symptoms: headaches   Associated symptoms: no shortness of breath and no vomiting   Headaches:    Severity:  Moderate   Onset quality:  Gradual   Duration:  1 day   Timing:  Constant   Progression:  Unchanged   Chronicity:  Recurrent Headache Pain location:  R temporal Quality:  Dull Radiates to:  Does not radiate Onset quality:  Gradual Timing:  Constant Progression:  Unchanged Chronicity:  New Similar to prior headaches: no   Associated symptoms: dizziness   Associated symptoms: no abdominal pain, no cough, no fever and no vomiting     Past Medical History  Diagnosis Date  . Headache(784.0)     3X WEEK  . 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 GERD (gastroesophageal reflux disease) 2012    diet controlled - no meds  . Irregular periods/menstrual cycles 01/30/2012  . Poor social situation 01/30/2012  . Late prenatal care 01/30/2012   Past Surgical History  Procedure Laterality Date  . Cesarean section  2009  . Tonsillectomy  AGE 30  . Tonsillectomy and adenoidectomy  AGEB 18  . Cesarean section  01/30/2012    Procedure: CESAREAN SECTION;  Surgeon: Michael Litter, MD;  Location: [facility] OR[facility];  Service: Gynecology;  Laterality: N/A;  Repeat C/S  . Cholecystectomy N/A 10/16/2013    Procedure: LAPAROSCOPIC CHOLECYSTECTOMY WITH INTRAOPERATIVE CHOLANGIOGRAM;  Surgeon: Wilmon Arms. Corliss Skains, MD;  Location: [facility] OR;  Service: General;   Laterality: N/A;   Family History  Problem Relation Age of Onset  . Asthma Mother   . Hypertension Mother   . Early death Mother     AGE 75  . Cancer Maternal Grandmother    History  Substance Use Topics  . Smoking status: Current Some Day Smoker -- 0.50 packs/day for 6 years    Types: Cigarettes  . Smokeless tobacco: Never Used  . Alcohol Use: 1.5 oz/week    3 Standard drinks or equivalent per week   OB History    Gravida Para Term Preterm AB TAB SAB Ectopic Multiple Living   Obstetric Comments   NO PRENATAL CARE WITH FIRST PREGNANCY. 36-38 WKS  GEATATION.  "DID NOT KNOW SHE WAS PREGNANT UNTIL DELIVERY."     Review of Systems  Constitutional: Negative for fever.  Respiratory: Negative for cough and shortness of breath.   Gastrointestinal: Negative for vomiting and abdominal pain.  Neurological: Positive for dizziness and headaches.  All other systems reviewed and are negative.     Allergies  Review of patient's allergies indicates no known allergies.  Home Medications   Prior to Admission medications   Medication Sig Start Date End Date Taking? Authorizing Provider  acetaminophen (TYLENOL) 500 MG tablet Take 1 tablet (500 mg total) by mouth every 6 (six) hours as needed. 08/28/14   Jennifer Piepenbrink, PA-C  cyclobenzaprine (FLEXERIL) 10 MG tablet Take 1  tablet (10 mg total) by mouth 3 (three) times daily as needed for muscle spasms. 11/23/14   Trixie Dredge, PA-C  guaiFENesin-codeine 100-10 MG/5ML syrup Take 5 mLs by mouth 3 (three) times daily as needed for cough. 08/28/14   Jennifer Piepenbrink, PA-C  naproxen (NAPROSYN) 500 MG tablet Take 1 tablet (500 mg total) by mouth 2 (two) times daily. 11/23/14   Trixie Dredge, PA-C   BP 137/77 mmHg  Pulse 78  Temp(Src) 98.8 F (37.1 C) (Oral)  Resp 22  SpO2 99%  LMP 01/11/2015 (Within Days) Physical Exam  Constitutional: She is oriented to person, place, and time. She appears well-developed and  well-nourished. No distress.  HENT:  Head: Normocephalic and atraumatic.  Mouth/Throat: Oropharynx is clear and moist.  Eyes: EOM are normal. Pupils are equal, round, and reactive to light.  Neck: Normal range of motion. Neck supple.  Cardiovascular: Normal rate and regular rhythm.  Exam reveals no friction rub.   No murmur heard. Pulmonary/Chest: Effort normal and breath sounds normal. No respiratory distress. She has no wheezes. She has no rales.  Abdominal: Soft. She exhibits no distension. There is no tenderness. There is no rebound.  Musculoskeletal: Normal range of motion. She exhibits no edema.  Neurological: She is alert and oriented to person, place, and time. No cranial nerve deficit. She exhibits normal muscle tone. Coordination normal.  Skin: No rash noted. She is not diaphoretic.  Nursing note and vitals reviewed.   ED Course  Procedures (including critical care time) Labs Review Labs Reviewed - No data to display  Imaging Review No results found.   EKG Interpretation None      MDM   Final diagnoses:  Other migraine without status migrainosus, not intractable    29 year old female here with headache. History of migraines previously, this feels similar however is in a different spot of her head. No fever. Mild associated spinning type dizziness. No nausea or vomiting. She is very well-appearing. She just finished eating breakfast on the way over here. She is neurologically intact. We'll get headache cocktail to help her headache at this time. Feeling much better after HA cocktail. She's ambulatory and eating here. Stable for discharge.   Elwin Mocha, MD 02/02/15 1124

## 2015-02-25 ENCOUNTER — Telehealth: Payer: Self-pay | Admitting: *Deleted

## 2015-02-25 NOTE — Telephone Encounter (Signed)
Patient contacted the office requesting a sooner appointment.  Attempted to contact the patient and left message for patient to call the office.

## 2015-02-27 NOTE — Telephone Encounter (Signed)
Patient rescheduled her Nexplanon Removal. Patient to call back if her appointment time and date of 03-05-15 2 @ 1:30 PM does not work for her.

## 2015-03-03 ENCOUNTER — Telehealth: Payer: Self-pay | Admitting: *Deleted

## 2015-03-03 NOTE — Telephone Encounter (Signed)
Patient contacted the office asking of we had an appointment available for Thursday at 8 am or 9 am. Patient has an appointment already schedule for that afternoon.  Attempted to contact the patient and left message for patient letting her know that there were no appointments available at that time. Patient advised to contact the office if she needs to reschedule her appointment.

## 2015-03-05 ENCOUNTER — Encounter: Payer: Self-pay | Admitting: Certified Nurse Midwife

## 2015-03-05 ENCOUNTER — Ambulatory Visit (INDEPENDENT_AMBULATORY_CARE_PROVIDER_SITE_OTHER): Payer: Medicaid Other | Admitting: Certified Nurse Midwife

## 2015-03-05 VITALS — BP 121/71 | HR 80 | Temp 98.4°F | Wt 253.0 lb

## 2015-03-05 DIAGNOSIS — Z3049 Encounter for surveillance of other contraceptives: Secondary | ICD-10-CM | POA: Diagnosis not present

## 2015-03-05 DIAGNOSIS — Z3169 Encounter for other general counseling and advice on procreation: Secondary | ICD-10-CM

## 2015-03-05 MED ORDER — CITRANATAL HARMONY 30-1-260 MG PO CAPS: 1.0000 | ORAL_CAPSULE | Freq: Every day | ORAL | Status: DC

## 2015-03-05 NOTE — Progress Notes (Signed)
Patient ID: Ernest Haber, female   DOB: 10/16/1985, 29 y.o.   MRN: [id number]  Procedure Note Removal of Nexplanon  Patient had Nexplanon inserted in 2013. Desires removal today. Declines another Nexplanon, desires pregnancy.    Reviewed risk and benefits of procedure. Alternative options discussed Patient reported understanding and agreed to continue.   The patient's left arm was palpated and the implant device located. The area was prepped with Betadinex3. The distal end of the device was palpated and 1 cc of 1% lidocaine with epinephrine was injected. A 3 mm incision was made. Any fibrotic tissue was carefully dissected away using blunt and/or sharp dissection. The device was removed in an intact manner. Steri-strips and a sterile dressing were applied to the incision.   Followup: The patient tolerated the procedure well without complications. Standard post-procedure care is explained and return precautions are given.  Patient plans no method.  Preconception counseling given.  Annual exam scheduled.    Orvilla Cornwall CNM

## 2015-03-09 ENCOUNTER — Emergency Department (INDEPENDENT_AMBULATORY_CARE_PROVIDER_SITE_OTHER)
Admission: EM | Admit: 2015-03-09 | Discharge: 2015-03-09 | Disposition: A | Discharge status: Home / Self Care | Payer: Medicaid Other | Source: Home / Self Care | Attending: Family Medicine | Admitting: Family Medicine

## 2015-03-09 ENCOUNTER — Encounter (HOSPITAL_COMMUNITY): Payer: Self-pay | Admitting: Emergency Medicine

## 2015-03-09 DIAGNOSIS — J0100 Acute maxillary sinusitis, unspecified: Secondary | ICD-10-CM | POA: Diagnosis not present

## 2015-03-09 MED ORDER — DOXYCYCLINE HYCLATE 100 MG PO CAPS: 100.0000 mg | ORAL_CAPSULE | Freq: Two times a day (BID) | ORAL | Status: DC

## 2015-03-09 MED ORDER — IPRATROPIUM BROMIDE 0.06 % NA SOLN: 2.0000 | Freq: Four times a day (QID) | NASAL | Status: DC

## 2015-03-09 NOTE — ED Notes (Signed)
Reports symptoms started last night.  Congested in head, headache, dizziness,  blowing yellow from sinus'.  Reports a 102.6 temp last night, none today.  Minimal cough.

## 2015-03-09 NOTE — ED Provider Notes (Signed)
CSN: [id number]     Arrival date & time 03/09/15  1358 History   First MD Initiated Contact with Patient 03/09/15 1459     Chief Complaint  Patient presents with  . URI   (Consider location/radiation/quality/duration/timing/severity/associated sxs/prior Treatment) Patient is a 29 y.o. female presenting with URI. The history is provided by the patient.  URI Presenting symptoms: congestion, cough, fatigue, fever, rhinorrhea and sore throat   Severity:  Mild Onset quality:  Gradual Duration:  2 days Progression:  Worsening Chronicity:  New Relieved by:  None tried Worsened by:  Nothing tried Ineffective treatments:  None tried Associated symptoms: no wheezing   Risk factors comment:  Smoker   Past Medical History  Diagnosis Date  . Headache(784.0)     3X WEEK  . 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 GERD (gastroesophageal reflux disease) 2012    diet controlled - no meds  . Irregular periods/menstrual cycles 01/30/2012  . Poor social situation 01/30/2012  . Late prenatal care 01/30/2012   Past Surgical History  Procedure Laterality Date  . Cesarean section  2009  . Tonsillectomy  AGE 56  . Tonsillectomy and adenoidectomy  AGEB 18  . Cesarean section  01/30/2012    Procedure: CESAREAN SECTION;  Surgeon: Michael Litter, MD;  Location: [facility];  Service: Gynecology;  Laterality: N/A;  Repeat C/S  . Cholecystectomy N/A 10/16/2013    Procedure: LAPAROSCOPIC CHOLECYSTECTOMY WITH INTRAOPERATIVE CHOLANGIOGRAM;  Surgeon: Wilmon Arms. Corliss Skains, MD;  Location: [facility];  Service: General;  Laterality: N/A;   Family History  Problem Relation Age of Onset  . Asthma Mother   . Hypertension Mother   . Early death Mother     AGE 32  . Cancer Maternal Grandmother    Social History  Substance Use Topics  . Smoking status: Current Some Day Smoker -- 0.50 packs/day for 6 years    Types: Cigarettes  . Smokeless tobacco: Never Used  . Alcohol Use: 1.5 oz/week    3 Standard drinks or equivalent per week    OB History    Gravida Para Term Preterm AB TAB SAB Ectopic Multiple Living   2 2 2       2       Obstetric Comments   NO PRENATAL CARE WITH FIRST PREGNANCY. 36-38 WKS  GEATATION.  "DID NOT KNOW SHE WAS PREGNANT UNTIL DELIVERY."     Review of Systems  Constitutional: Positive for fever and fatigue.  HENT: Positive for congestion, postnasal drip, rhinorrhea and sore throat.   Respiratory: Positive for cough. Negative for wheezing.   Cardiovascular: Negative.   Gastrointestinal: Negative.   All other systems reviewed and are negative.   Allergies  Review of patient's allergies indicates no known allergies.  Home Medications   Prior to Admission medications   Medication Sig Start Date End Date Taking? Authorizing Provider  acetaminophen (TYLENOL) 500 MG tablet Take 1 tablet (500 mg total) by mouth every 6 (six) hours as needed. 08/28/14   Jennifer Piepenbrink, PA-C  cyclobenzaprine (FLEXERIL) 10 MG tablet Take 1 tablet (10 mg total) by mouth 3 (three) times daily as needed for muscle spasms. Patient not taking: Reported on 03/05/2015 11/23/14   Trixie Dredge, PA-C  doxycycline (VIBRAMYCIN) 100 MG capsule Take 1 capsule (100 mg total) by mouth 2 (two) times daily. 03/09/15   Linna Hoff, MD  guaiFENesin-codeine 100-10 MG/5ML syrup Take 5 mLs by mouth 3 (three) times daily as needed for cough. Patient not taking: Reported on  03/05/2015 08/28/14   Victorino Dike Piepenbrink, PA-C  ipratropium (ATROVENT) 0.06 % nasal spray Place 2 sprays into both nostrils 4 (four) times daily. 03/09/15   Linna Hoff, MD  naproxen (NAPROSYN) 500 MG tablet Take 1 tablet (500 mg total) by mouth 2 (two) times daily. Patient not taking: Reported on 03/05/2015 11/23/14   Trixie Dredge, PA-C  Prenat w/o A-FeCbn-DSS-FA-DHA (CITRANATAL HARMONY) 30-1-260 MG CAPS Take 1 tablet by mouth daily. 03/05/15   Roe Coombs, CNM   Meds Ordered and Administered this Visit  Medications - No data to display  BP 108/83 mmHg  Pulse 77   Temp(Src) 98.5 F (36.9 C) (Oral)  Resp 16  SpO2 97%  LMP 03/02/2015 No data found.   Physical Exam  Constitutional: She is oriented to person, place, and time. She appears well-developed and well-nourished. No distress.  HENT:  Right Ear: External ear normal.  Left Ear: External ear normal.  Nose: Mucosal edema and rhinorrhea present. Right sinus exhibits frontal sinus tenderness. Right sinus exhibits no maxillary sinus tenderness. Left sinus exhibits frontal sinus tenderness. Left sinus exhibits no maxillary sinus tenderness.  Mouth/Throat: Oropharynx is clear and moist.  Neck: Normal range of motion. Neck supple.  Cardiovascular: Regular rhythm, normal heart sounds and intact distal pulses.   Pulmonary/Chest: Effort normal and breath sounds normal.  Lymphadenopathy:    She has no cervical adenopathy.  Neurological: She is alert and oriented to person, place, and time.  Skin: Skin is warm and dry.  Nursing note and vitals reviewed.   ED Course  Procedures (including critical care time)  Labs Review Labs Reviewed - No data to display  Imaging Review No results found.   Visual Acuity Review  Right Eye Distance:   Left Eye Distance:   Bilateral Distance:    Right Eye Near:   Left Eye Near:    Bilateral Near:         MDM   1. Subacute maxillary sinusitis     rx for doxy, and atrovent.    Linna Hoff, MD 03/09/15 [phone]

## 2015-03-09 NOTE — Discharge Instructions (Signed)
Take all of medicine, drink lots of fluids, no more smoking, see your doctor if further problems  °

## 2015-03-19 ENCOUNTER — Ambulatory Visit: Payer: Self-pay | Admitting: Certified Nurse Midwife

## 2015-04-03 ENCOUNTER — Encounter (HOSPITAL_COMMUNITY): Payer: Self-pay | Admitting: Emergency Medicine

## 2015-04-03 ENCOUNTER — Emergency Department (HOSPITAL_COMMUNITY)
Admission: EM | Admit: 2015-04-03 | Discharge: 2015-04-03 | Discharge status: Against Medical Advice | Payer: Medicaid Other | Attending: Emergency Medicine | Admitting: Emergency Medicine

## 2015-04-03 DIAGNOSIS — R112 Nausea with vomiting, unspecified: Secondary | ICD-10-CM | POA: Diagnosis not present

## 2015-04-03 DIAGNOSIS — R51 Headache: Secondary | ICD-10-CM | POA: Insufficient documentation

## 2015-04-03 DIAGNOSIS — Z720 Tobacco use: Secondary | ICD-10-CM | POA: Insufficient documentation

## 2015-04-03 NOTE — ED Notes (Signed)
Patient states she is tired of waiting and will go home an rest to see if that helps her headache. Patient in NAD, ambulatory with steady gait.

## 2015-04-03 NOTE — ED Notes (Signed)
Pt. reports headache with mild nausea onset this evening ,denies fever or photophobia .

## 2015-04-21 ENCOUNTER — Emergency Department (HOSPITAL_COMMUNITY)
Admission: EM | Admit: 2015-04-21 | Discharge: 2015-04-21 | Disposition: A | Discharge status: Home / Self Care | Payer: Medicaid Other | Attending: Emergency Medicine | Admitting: Emergency Medicine

## 2015-04-21 ENCOUNTER — Encounter (HOSPITAL_COMMUNITY): Payer: Self-pay | Admitting: Emergency Medicine

## 2015-04-21 DIAGNOSIS — Z79899 Other long term (current) drug therapy: Secondary | ICD-10-CM | POA: Insufficient documentation

## 2015-04-21 DIAGNOSIS — G43809 Other migraine, not intractable, without status migrainosus: Secondary | ICD-10-CM

## 2015-04-21 DIAGNOSIS — R51 Headache: Secondary | ICD-10-CM | POA: Diagnosis present

## 2015-04-21 DIAGNOSIS — Z8619 Personal history of other infectious and parasitic diseases: Secondary | ICD-10-CM | POA: Diagnosis not present

## 2015-04-21 DIAGNOSIS — Z720 Tobacco use: Secondary | ICD-10-CM | POA: Diagnosis not present

## 2015-04-21 DIAGNOSIS — Z3202 Encounter for pregnancy test, result negative: Secondary | ICD-10-CM | POA: Diagnosis not present

## 2015-04-21 LAB — URINALYSIS, ROUTINE W REFLEX MICROSCOPIC
BILIRUBIN URINE: NEGATIVE
GLUCOSE, UA: NEGATIVE mg/dL
Ketones, ur: NEGATIVE mg/dL
Leukocytes, UA: NEGATIVE
Nitrite: NEGATIVE
PH: 6 (ref 5.0–8.0)
Protein, ur: NEGATIVE mg/dL
SPECIFIC GRAVITY, URINE: 1.021 (ref 1.005–1.030)
UROBILINOGEN UA: 1 mg/dL (ref 0.0–1.0)

## 2015-04-21 LAB — URINE MICROSCOPIC-ADD ON

## 2015-04-21 LAB — POC URINE PREG, ED: PREG TEST UR: NEGATIVE

## 2015-04-21 MED ORDER — METOCLOPRAMIDE HCL 5 MG/ML IJ SOLN
10.0000 mg | Freq: Once | INTRAMUSCULAR | Status: AC
  Administered 2015-04-21: 10 mg via INTRAVENOUS
  Filled 2015-04-21: qty 2

## 2015-04-21 MED ORDER — KETOROLAC TROMETHAMINE 30 MG/ML IJ SOLN
30.0000 mg | Freq: Once | INTRAMUSCULAR | Status: AC
  Administered 2015-04-21: 30 mg via INTRAVENOUS
  Filled 2015-04-21: qty 1

## 2015-04-21 MED ORDER — DIPHENHYDRAMINE HCL 50 MG/ML IJ SOLN
25.0000 mg | Freq: Once | INTRAMUSCULAR | Status: AC
  Administered 2015-04-21: 25 mg via INTRAVENOUS
  Filled 2015-04-21: qty 1

## 2015-04-21 MED ORDER — DEXAMETHASONE SODIUM PHOSPHATE 10 MG/ML IJ SOLN
10.0000 mg | Freq: Once | INTRAMUSCULAR | Status: AC
  Administered 2015-04-21: 10 mg via INTRAVENOUS
  Filled 2015-04-21: qty 1

## 2015-04-21 NOTE — ED Notes (Signed)
Hx of migraines and mid back and buttock pain; no MD for these. Did not take any OTC meds; came straight here since was supposed to go to work this morning. No visual issues.

## 2015-04-21 NOTE — Discharge Instructions (Signed)
Migraine Headache A migraine headache is an intense, throbbing pain on one or both sides of your head. A migraine can last for 30 minutes to several hours. CAUSES  The exact cause of a migraine headache is not always known. However, a migraine may be caused when nerves in the brain become irritated and release chemicals that cause inflammation. This causes pain. Certain things may also trigger migraines, such as:  Alcohol.  Smoking.  Stress.  Menstruation.  Aged cheeses.  Foods or drinks that contain nitrates, glutamate, aspartame, or tyramine.  Lack of sleep.  Chocolate.  Caffeine.  Hunger.  Physical exertion.  Fatigue.  Medicines used to treat chest pain (nitroglycerine), birth control pills, estrogen, and some blood pressure medicines. SIGNS AND SYMPTOMS  Pain on one or both sides of your head.  Pulsating or throbbing pain.  Severe pain that prevents daily activities.  Pain that is aggravated by any physical activity.  Nausea, vomiting, or both.  Dizziness.  Pain with exposure to bright lights, loud noises, or activity.  General sensitivity to bright lights, loud noises, or smells. Before you get a migraine, you may get warning signs that a migraine is coming (aura). An aura may include:  Seeing flashing lights.  Seeing bright spots, halos, or zigzag lines.  Having tunnel vision or blurred vision.  Having feelings of numbness or tingling.  Having trouble talking.  Having muscle weakness. DIAGNOSIS  A migraine headache is often diagnosed based on:  Symptoms.  Physical exam.  A CT scan or MRI of your head. These imaging tests cannot diagnose migraines, but they can help rule out other causes of headaches. TREATMENT Medicines may be given for pain and nausea. Medicines can also be given to help prevent recurrent migraines.  HOME CARE INSTRUCTIONS  Only take over-the-counter or prescription medicines for pain or discomfort as directed by your  health care provider. The use of long-term narcotics is not recommended.  Lie down in a dark, quiet room when you have a migraine.  Keep a journal to find out what may trigger your migraine headaches. For example, write down:  What you eat and drink.  How much sleep you get.  Any change to your diet or medicines.  Limit alcohol consumption.  Quit smoking if you smoke.  Get 7-9 hours of sleep, or as recommended by your health care provider.  Limit stress.  Keep lights dim if bright lights bother you and make your migraines worse. SEEK IMMEDIATE MEDICAL CARE IF:   Your migraine becomes severe.  You have a fever.  You have a stiff neck.  You have vision loss.  You have muscular weakness or loss of muscle control.  You start losing your balance or have trouble walking.  You feel faint or pass out.  You have severe symptoms that are different from your first symptoms. MAKE SURE YOU:   Understand these instructions.  Will watch your condition.  Will get help right away if you are not doing well or get worse.   This information is not intended to replace advice given to you by your health care provider. Make sure you discuss any questions you have with your health care provider.  Follow up with you PCP. Recommend visit with neurology for further evaluation for migraines. Referral given. Return to the ED if you experience worsening of your symptoms, blurry vision, vomiting, numbness/tingling in an extremity.

## 2015-04-22 IMAGING — RF DG CHOLANGIOGRAM OPERATIVE
1 series · 5 of 5 positions shown · non-contrast
Comparison: None.

CLINICAL DATA: Cholelithiasis

EXAM:
INTRAOPERATIVE CHOLANGIOGRAM
TECHNIQUE: Cholangiographic images from the C-arm fluoroscopic device were
submitted for interpretation post-operatively. Please see the
procedural report for the amount of contrast and the fluoroscopy
time utilized.

[Series 1: run · 2 acquisitions, 5 frames shown]
[im 1/2]
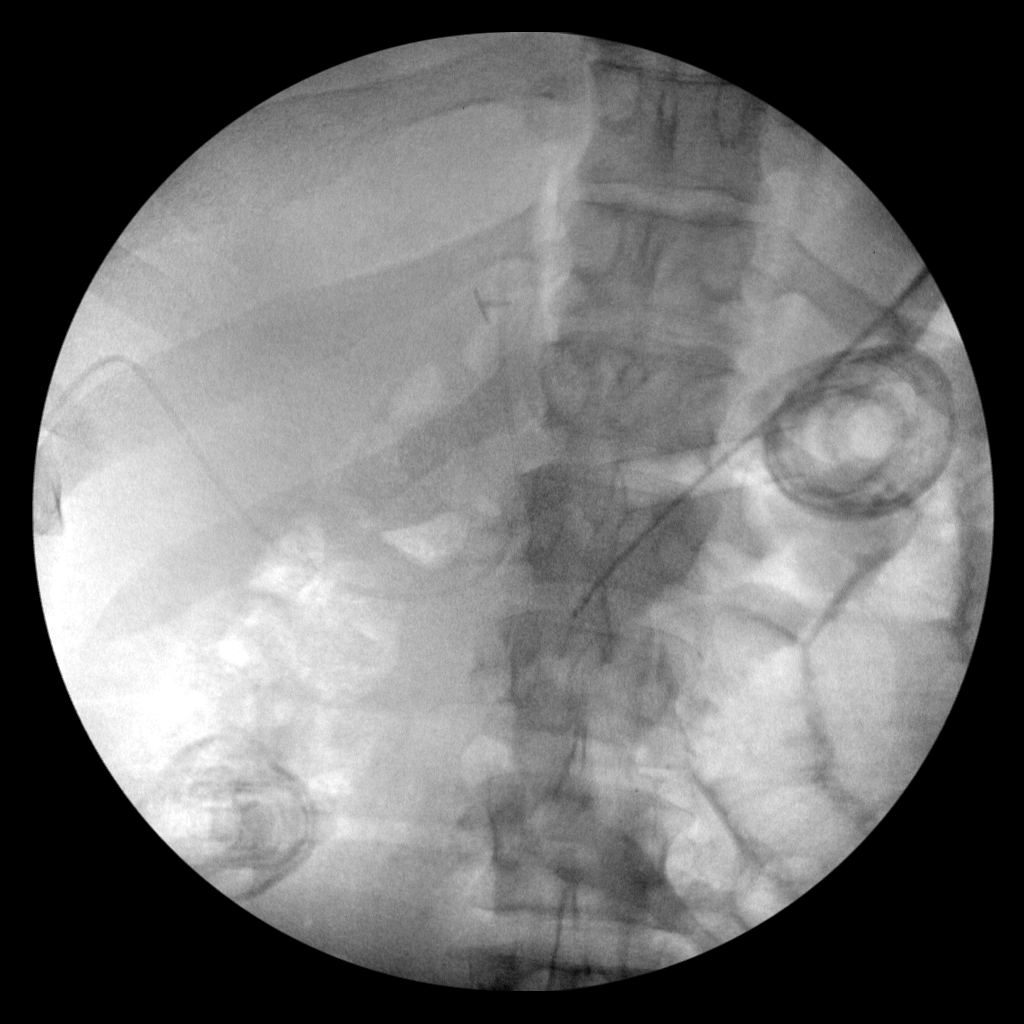
[im 1/2]
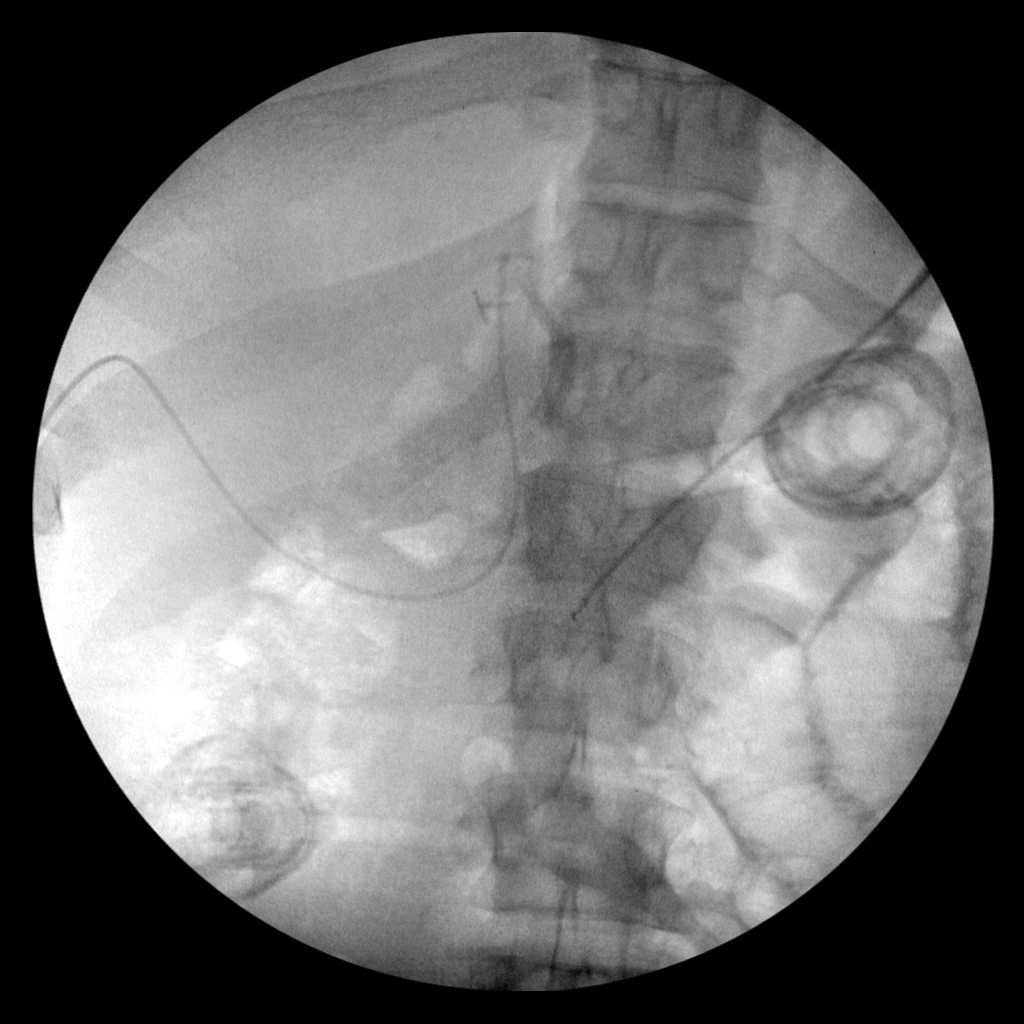
[im 1/2]
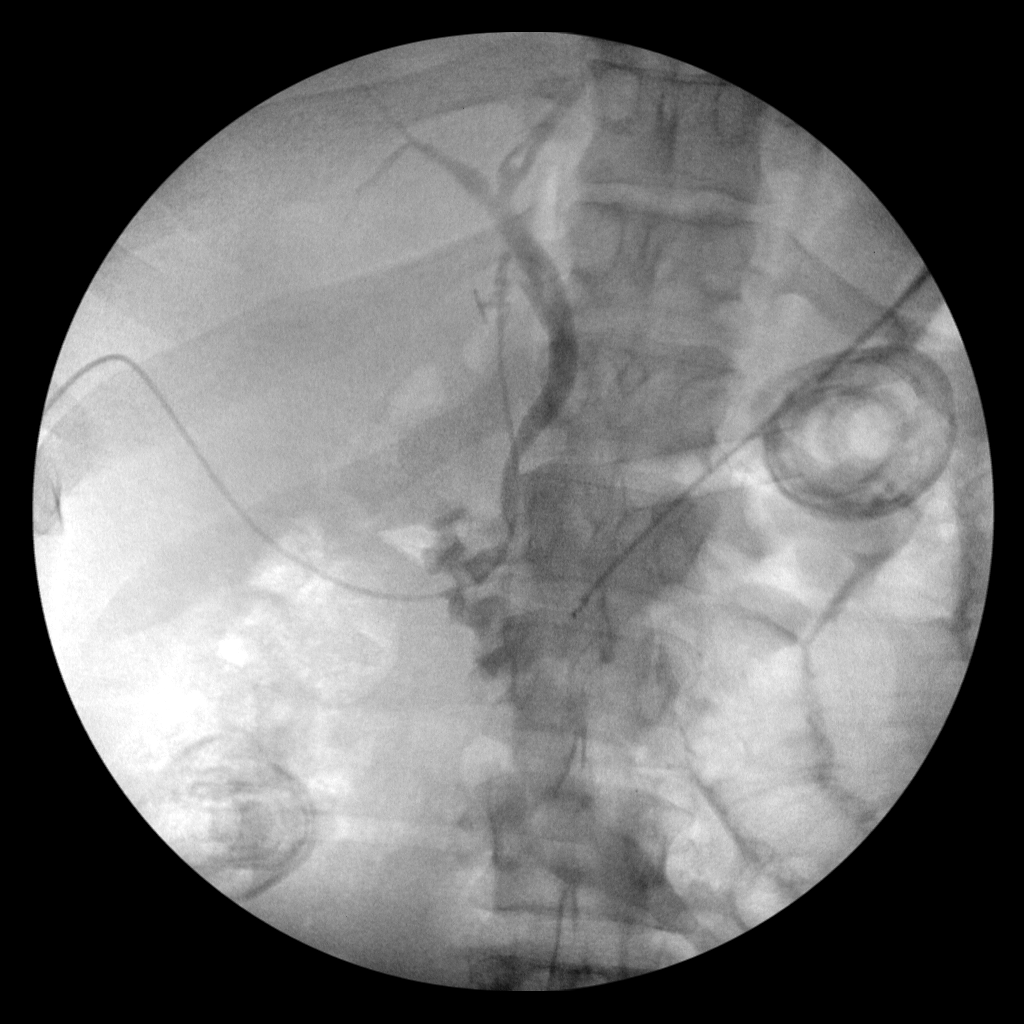
[im 1/2]
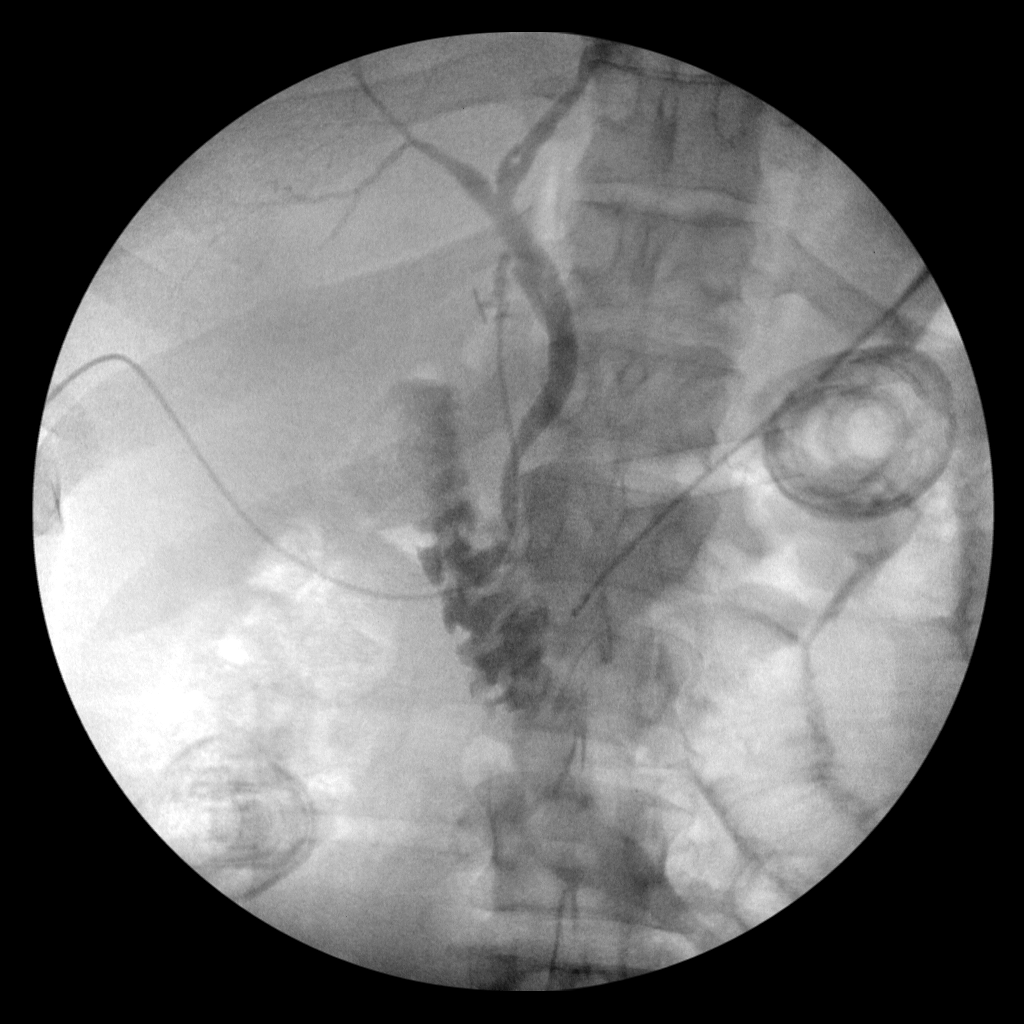
[im 2/2]
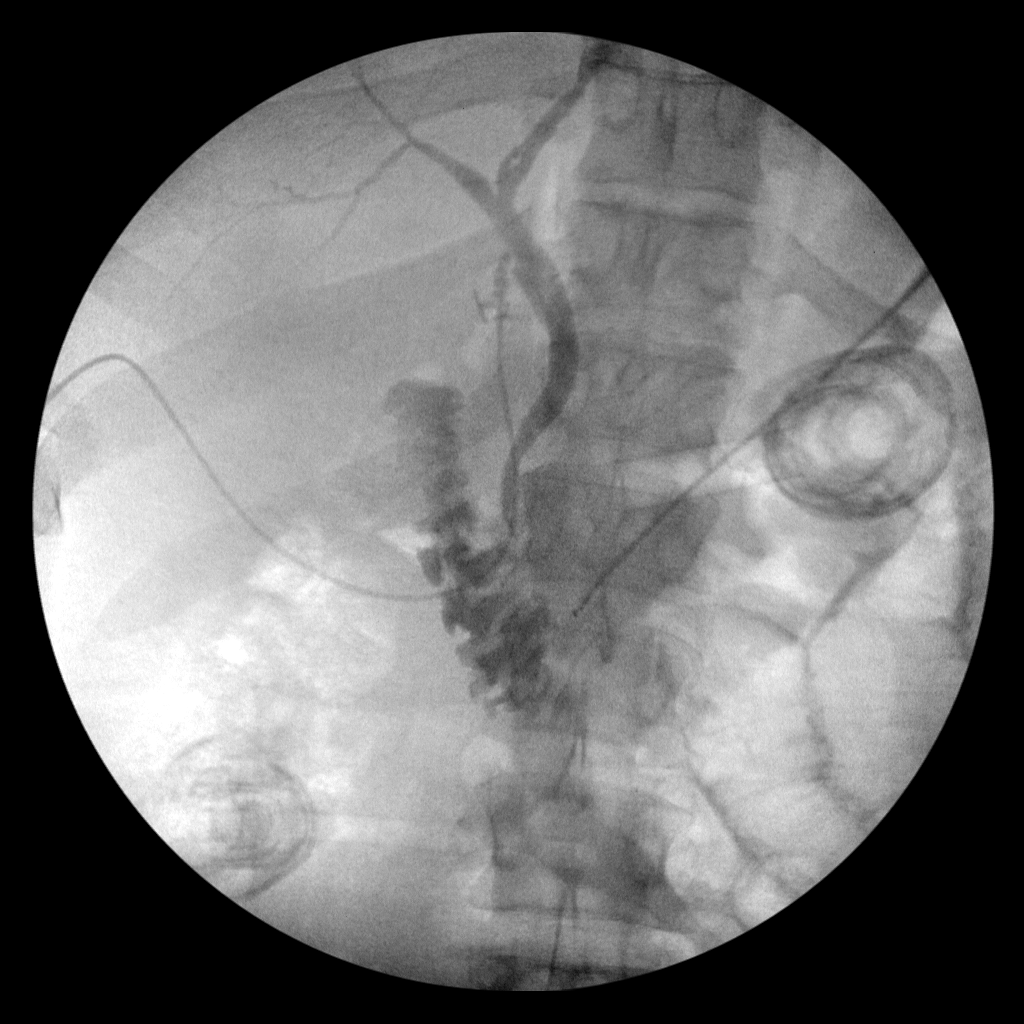

[5 of 5 positions shown; findings below may reference images not displayed]

FINDINGS: No persistent filling defects in the common duct. Intrahepatic ducts
are incompletely visualized, appearing decompressed centrally.
Contrast passes into the duodenum.

:
Negative for retained common duct stone.

## 2015-04-23 NOTE — ED Provider Notes (Signed)
CSN: [id number]     Arrival date & time 04/21/15  4098 History   First MD Initiated Contact with Patient 04/21/15 [phone]     Chief Complaint  Patient presents with  . Headache     (Consider location/radiation/quality/duration/timing/severity/associated sxs/prior Treatment) HPI Comments: Pt also c/o mild lower back pain that is chronic. No bowel/bladder incontinence, saddle anesthesia.   Patient is a 29 y.o. female presenting with headaches.  Headache Pain location:  Generalized Quality:  Dull Radiates to:  Does not radiate Severity currently:  7/10 Severity at highest:  9/10 Onset quality:  Gradual Duration:  1 day Timing:  Constant Progression:  Unchanged Chronicity:  Recurrent Similar to prior headaches: yes (Pt states that this headache is less severe than her typical migraines)   Context: bright light and loud noise   Context: not activity, not caffeine, not coughing, not defecating, not eating, not stress, not exposure to cold air, not intercourse and not straining   Relieved by:  Nothing Worsened by:  Light Ineffective treatments:  NSAIDs and acetaminophen Associated symptoms: back pain and photophobia   Associated symptoms: no abdominal pain, no blurred vision, no congestion, no cough, no diarrhea, no dizziness, no facial pain, no fever, no near-syncope, no visual change, no vomiting and no weakness   Risk factors: no anger, no family hx of SAH, does not have insomnia and lifestyle not sedentary      Past Medical History  Diagnosis Date  . Headache(784.0)     3X WEEK  . 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 GERD (gastroesophageal reflux disease) 2012    diet controlled - no meds  . Irregular periods/menstrual cycles 01/30/2012  . Poor social situation 01/30/2012  . Late prenatal care 01/30/2012   Past Surgical History  Procedure Laterality Date  . Cesarean section  2009  . Tonsillectomy  AGE 37  . Tonsillectomy and adenoidectomy  AGEB 18  . Cesarean section  01/30/2012     Procedure: CESAREAN SECTION;  Surgeon: Michael Litter, MD;  Location: [facility];  Service: Gynecology;  Laterality: N/A;  Repeat C/S  . Cholecystectomy N/A 10/16/2013    Procedure: LAPAROSCOPIC CHOLECYSTECTOMY WITH INTRAOPERATIVE CHOLANGIOGRAM;  Surgeon: Wilmon Arms. Corliss Skains, MD;  Location: [facility];  Service: General;  Laterality: N/A;   Family History  Problem Relation Age of Onset  . Asthma Mother   . Hypertension Mother   . Early death Mother     AGE 80  . Cancer Maternal Grandmother    Social History  Substance Use Topics  . Smoking status: Current Some Day Smoker -- 0.00 packs/day for 6 years    Types: Cigarettes  . Smokeless tobacco: Never Used  . Alcohol Use: Yes   OB History    Gravida Para Term Preterm AB TAB SAB Ectopic Multiple Living   Obstetric Comments   NO PRENATAL CARE WITH FIRST PREGNANCY. 36-38 WKS  GEATATION.  "DID NOT KNOW SHE WAS PREGNANT UNTIL DELIVERY."     Review of Systems  Constitutional: Negative for fever.  HENT: Negative for congestion.   Eyes: Positive for photophobia. Negative for blurred vision.  Respiratory: Negative for cough.   Cardiovascular: Negative for near-syncope.  Gastrointestinal: Negative for vomiting, abdominal pain and diarrhea.  Musculoskeletal: Positive for back pain.  Neurological: Positive for headaches. Negative for dizziness and weakness.  All other systems reviewed and are negative.     Allergies  Review  of patient's allergies indicates no known allergies.  Home Medications   Prior to Admission medications   Medication Sig Start Date End Date Taking? Authorizing Provider  acetaminophen (TYLENOL) 500 MG tablet Take 1 tablet (500 mg total) by mouth every 6 (six) hours as needed. 08/28/14  Yes Jennifer Piepenbrink, PA-C  ipratropium (ATROVENT) 0.06 % nasal spray Place 2 sprays into both nostrils 4 (four) times daily. 03/09/15  Yes Linna Hoff, MD  Prenat w/o A-FeCbn-DSS-FA-DHA (CITRANATAL HARMONY)  30-1-260 MG CAPS Take 1 tablet by mouth daily. 03/05/15  Yes Rachelle A Denney, CNM   BP 124/69 mmHg  Pulse 75  Temp(Src) 97.7 F (36.5 C) (Oral)  Resp 18  SpO2 99%  LMP 04/04/2015 Physical Exam  Constitutional: She is oriented to person, place, and time. She appears well-developed and well-nourished. No distress.  HENT:  Head: Normocephalic and atraumatic.  Mouth/Throat: Oropharynx is clear and moist. No oropharyngeal exudate.  Eyes: Conjunctivae and EOM are normal. Pupils are equal, round, and reactive to light. Right eye exhibits no discharge. Left eye exhibits no discharge. No scleral icterus.  Neck: Normal range of motion. Neck supple.  No meningismus  Cardiovascular: Normal rate, regular rhythm, normal heart sounds and intact distal pulses.  Exam reveals no gallop and no friction rub.   No murmur heard. Pulmonary/Chest: Effort normal and breath sounds normal. No respiratory distress. She has no wheezes. She has no rales. She exhibits no tenderness.  Abdominal: Soft. Bowel sounds are normal. She exhibits no distension and no mass. There is no tenderness. There is no rebound and no guarding.  Musculoskeletal: Normal range of motion. She exhibits no edema.  No midline spinal tenderness. Negative SLR.   Mild TTP of R sided lumbar paraspinal muscles.   Neurological: She is alert and oriented to person, place, and time. No cranial nerve deficit. She exhibits normal muscle tone. Coordination normal.  Strength 5/5 throughout. No sensory deficits.  No gait abnormality  Skin: Skin is warm and dry. No rash noted. She is not diaphoretic. No erythema. No pallor.  Psychiatric: She has a normal mood and affect. Her behavior is normal.  Nursing note and vitals reviewed.   ED Course  Procedures (including critical care time) Labs Review Labs Reviewed  URINALYSIS, ROUTINE W REFLEX MICROSCOPIC (NOT AT [facility]) - Abnormal; Notable for the following:    Hgb urine dipstick TRACE (*)    All other  components within normal limits  URINE MICROSCOPIC-ADD ON - Abnormal; Notable for the following:    Squamous Epithelial / LPF FEW (*)    Bacteria, UA FEW (*)    All other components within normal limits  POC URINE PREG, ED    Imaging Review No results found. I have personally reviewed and evaluated these images and lab results as part of my medical decision-making.   EKG Interpretation None      MDM   Final diagnoses:  Other migraine without status migrainosus, not intractable    29 year old female here with headache similar to previous migraines. Pt states that this particular headache is actually less severe than her normal migraines. Afebrile. No neck stiffness or meningismus. No nausea or vomiting. Denies blurry vision, numbness or tingling. She is very well-appearing. She is neurologically intact.   Patient given migraine cocktail. Patient states that she feels much better after medication. Currently symptom-free. Requesting to go home at this time. Patient able to ambulate without difficulty. Stable for discharge.  Return precautions outlined in patient discharge instructions.  Lester KinsmanSamantha Tripp [place], PA-C 04/23/15 1610  Blane OharaJoshua Zavitz, MD 04/24/15 [phone]

## 2015-05-19 ENCOUNTER — Other Ambulatory Visit: Payer: Medicaid Other

## 2015-05-20 ENCOUNTER — Telehealth: Payer: Self-pay | Admitting: *Deleted

## 2015-05-20 NOTE — Telephone Encounter (Signed)
11;10 Patient returned call. 11:33 Patient is reporting some light spotting. LMP 04/12/2015 Patient reports she had some spotting this morning- pinkish red when she went to the bathroom. Patient reports no cramping and otherwise no symptoms of a problem. Reviewed reasons for spotting during early pregnancy and signs of SAB. Patient is aware and will call if she has changes- she will keep her NOB appointment.

## 2015-05-20 NOTE — Telephone Encounter (Signed)
Patient states she has an emergent question. 11:06 LM on VM to CB

## 2015-06-23 ENCOUNTER — Encounter: Payer: Self-pay | Admitting: Certified Nurse Midwife

## 2015-06-23 ENCOUNTER — Ambulatory Visit (INDEPENDENT_AMBULATORY_CARE_PROVIDER_SITE_OTHER): Payer: Medicaid Other | Admitting: Certified Nurse Midwife

## 2015-06-23 VITALS — BP 122/70 | HR 94 | Temp 98.6°F | Wt 274.0 lb

## 2015-06-23 DIAGNOSIS — Z3480 Encounter for supervision of other normal pregnancy, unspecified trimester: Secondary | ICD-10-CM | POA: Insufficient documentation

## 2015-06-23 DIAGNOSIS — Z331 Pregnant state, incidental: Secondary | ICD-10-CM | POA: Diagnosis not present

## 2015-06-23 DIAGNOSIS — B3731 Acute candidiasis of vulva and vagina: Secondary | ICD-10-CM

## 2015-06-23 DIAGNOSIS — Z1389 Encounter for screening for other disorder: Secondary | ICD-10-CM | POA: Diagnosis not present

## 2015-06-23 DIAGNOSIS — Z3481 Encounter for supervision of other normal pregnancy, first trimester: Secondary | ICD-10-CM | POA: Diagnosis not present

## 2015-06-23 DIAGNOSIS — B373 Candidiasis of vulva and vagina: Secondary | ICD-10-CM | POA: Diagnosis not present

## 2015-06-23 LAB — POCT URINALYSIS DIPSTICK
Bilirubin, UA: NEGATIVE
Blood, UA: NEGATIVE
Glucose, UA: NEGATIVE
Ketones, UA: NEGATIVE
Leukocytes, UA: NEGATIVE
Nitrite, UA: NEGATIVE
Protein, UA: NEGATIVE
Spec Grav, UA: 1.02
Urobilinogen, UA: NEGATIVE
pH, UA: 5

## 2015-06-23 MED ORDER — TERCONAZOLE 0.4 % VA CREA: 1.0000 | TOPICAL_CREAM | Freq: Every day | VAGINAL | Status: DC

## 2015-06-23 MED ORDER — FLUCONAZOLE 100 MG PO TABS: 100.0000 mg | ORAL_TABLET | Freq: Once | ORAL | Status: DC

## 2015-06-23 NOTE — Progress Notes (Signed)
Subjective:    Ashley [name] is being seen today for her first obstetrical visit.  This is a planned pregnancy. She is at 2646w2d gestation. Her obstetrical history is significant for obesity and repeat C-section. Relationship with FOB: spouse, living together. Patient does intend to breast feed. Pregnancy history fully reviewed.  The information documented in the HPI was reviewed and verified.  Menstrual History: OB History    Gravida Para Term Preterm AB TAB SAB Ectopic Multiple Living   3 2 2       2       Obstetric Comments   NO PRENATAL CARE WITH FIRST PREGNANCY. 36-38 WKS  GEATATION.  "DID NOT KNOW SHE WAS PREGNANT UNTIL DELIVERY."      Menarche age: middle school.    Patient's last menstrual period was 04/12/2015.  Unsure of LMP    Past Medical History  Diagnosis Date  . Headache(784.0)     3X WEEK  . 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 GERD (gastroesophageal reflux disease) 2012    diet controlled - no meds  . Irregular periods/menstrual cycles 01/30/2012  . Poor social situation 01/30/2012  . Late prenatal care 01/30/2012    Past Surgical History  Procedure Laterality Date  . Cesarean section  2009  . Tonsillectomy  AGE 89  . Tonsillectomy and adenoidectomy  AGEB 18  . Cesarean section  01/30/2012    Procedure: CESAREAN SECTION;  Surgeon: Michael LitterNaima A Dillard, MD;  Location: [facility];  Service: Gynecology;  Laterality: N/A;  Repeat C/S  . Cholecystectomy N/A 10/16/2013    Procedure: LAPAROSCOPIC CHOLECYSTECTOMY WITH INTRAOPERATIVE CHOLANGIOGRAM;  Surgeon: Wilmon ArmsMatthew K. Corliss Skainssuei, MD;  Location: [facility];  Service: General;  Laterality: N/A;     (Not in a hospital admission) No Known Allergies  Social History  Substance Use Topics  . Smoking status: Current Some Day Smoker -- 0.00 packs/day for 6 years    Types: Cigarettes  . Smokeless tobacco: Never Used  . Alcohol Use: No    Family History  Problem Relation Age of Onset  . Asthma Mother   . Hypertension Mother   . Early death  Mother     AGE 31  . Cancer Maternal Grandmother      Review of Systems Constitutional: negative for weight loss Gastrointestinal: negative for vomiting Genitourinary:negative for genital lesions and vaginal discharge and dysuria Musculoskeletal:negative for back pain Behavioral/Psych: negative for abusive relationship, depression, illegal drug usage and tobacco use    Objective:    BP 122/70 mmHg  Pulse 94  Temp(Src) 98.6 F (37 C)  Wt 274 lb (124.286 kg)  LMP 04/12/2015 General Appearance:    Alert, cooperative, no distress, appears stated age  Head:    Normocephalic, without obvious abnormality, atraumatic  Eyes:    PERRL, conjunctiva/corneas clear, EOM's intact, fundi    benign, both eyes  Ears:    Normal TM's and external ear canals, both ears  Nose:   Nares normal, septum midline, mucosa normal, no drainage    or sinus tenderness  Throat:   Lips, mucosa, and tongue normal; teeth and gums normal  Neck:   Supple, symmetrical, trachea midline, no adenopathy;    thyroid:  no enlargement/tenderness/nodules; no carotid   bruit or JVD  Back:     Symmetric, no curvature, ROM normal, no CVA tenderness  Lungs:     Clear to auscultation bilaterally, respirations unlabored  Chest Wall:    No tenderness or deformity   Heart:  Regular rate and rhythm, S1 and S2 normal, no murmur, rub   or gallop  Breast Exam:    No tenderness, masses, or nipple abnormality  Abdomen:     Soft, non-tender, bowel sounds active all four quadrants,    no masses, no organomegaly  Genitalia:    Normal female without lesion, discharge or tenderness  Extremities:   Extremities normal, atraumatic, no cyanosis or edema  Pulses:   2+ and symmetric all extremities  Skin:   Skin color, texture, turgor normal, no rashes or lesions  Lymph nodes:   Cervical, supraclavicular, and axillary nodes normal  Neurologic:   CNII-XII intact, normal strength, sensation and reflexes    throughout       Cervix:  Long, thick,  closed, anterior, + chunky white discharge                                FHR:  Unable to determine d/t maternal body habitus and gestational age    Lab Review Urine pregnancy test Labs reviewed yes Radiologic studies reviewed no Assessment:    Pregnancy at [redacted]w[redacted]d weeks   Repeat C-section  Morbid obesity  Unsure of LMP  vulvovaginal candidiasis  Plan:      Prenatal vitamins.  Counseling provided regarding continued use of seat belts, cessation of alcohol consumption, smoking or use of illicit drugs; infection precautions i.e., influenza/TDAP immunizations, toxoplasmosis,CMV, parvovirus, listeria and varicella; workplace safety, exercise during pregnancy; routine dental care, safe medications, sexual activity, hot tubs, saunas, pools, travel, caffeine use, fish and methlymercury, potential toxins, hair treatments, varicose veins Weight gain recommendations per IOM guidelines reviewed: underweight/BMI< 18.5--> gain 28 - 40 lbs; normal weight/BMI 18.5 - 24.9--> gain 25 - 35 lbs; overweight/BMI 25 - 29.9--> gain 15 - 25 lbs; obese/BMI >30->gain  11 - 20 lbs Problem list reviewed and updated. FIRST/CF mutation testing/NIPT/QUAD SCREEN/fragile X/[name] population testing/Spinal muscular atrophy discussed: requested. Role of ultrasound in pregnancy discussed; fetal survey: requested. Amniocentesis discussed: not indicated. VBAC calculator score: VBAC consent form provided No orders of the defined types were placed in this encounter.   Orders Placed This Encounter  Procedures  . Culture, OB Urine  . SureSwab, Vaginosis/Vaginitis Plus  . US OB Comp Less 14 Wks    Standing Status: Future     Number of Occurrences:      Standing Expiration Date: 08/23/2016    Order Specific Question:  Reason for Exam (SYMPTOM  OR DIAGNOSIS REQUIRED)    Answer:  morbid obesity, viability, dating    Order Specific Question:  Preferred imaging location?    Answer:  [facility]  . Obstetric  panel  . HIV antibody  . Hemoglobinopathy evaluation  . Varicella zoster antibody, IgG  . VITAMIN D 25 Hydroxy (Vit-D Deficiency, Fractures)  . TSH  . POCT urinalysis dipstick    Follow up in 4 weeks. 50% of 30 min visit spent on counseling and coordination of care.

## 2015-06-24 LAB — OBSTETRIC PANEL
ANTIBODY SCREEN: NEGATIVE
BASOS ABS: 0 10*3/uL (ref 0.0–0.1)
Basophils Relative: 0 % (ref 0–1)
EOS PCT: 7 % — AB (ref 0–5)
Eosinophils Absolute: 0.6 10*3/uL (ref 0.0–0.7)
HCT: 36.8 % (ref 36.0–46.0)
Hemoglobin: 12.6 g/dL (ref 12.0–15.0)
Hepatitis B Surface Ag: NEGATIVE
LYMPHS ABS: 2 10*3/uL (ref 0.7–4.0)
LYMPHS PCT: 25 % (ref 12–46)
MCH: 28.1 pg (ref 26.0–34.0)
MCHC: 34.2 g/dL (ref 30.0–36.0)
MCV: 82 fL (ref 78.0–100.0)
MONOS PCT: 8 % (ref 3–12)
MPV: 10.7 fL (ref 8.6–12.4)
Monocytes Absolute: 0.6 10*3/uL (ref 0.1–1.0)
Neutro Abs: 4.8 10*3/uL (ref 1.7–7.7)
Neutrophils Relative %: 60 % (ref 43–77)
PLATELETS: 220 10*3/uL (ref 150–400)
RBC: 4.49 MIL/uL (ref 3.87–5.11)
RDW: 13.9 % (ref 11.5–15.5)
RUBELLA: 3.33 {index} — AB (ref <0.90)
Rh Type: POSITIVE
WBC: 8 10*3/uL (ref 4.0–10.5)

## 2015-06-24 LAB — TSH: TSH: 0.48 u[IU]/mL (ref 0.350–4.500)

## 2015-06-24 LAB — VARICELLA ZOSTER ANTIBODY, IGG: VARICELLA IGG: 1770 {index} — AB (ref <135.00)

## 2015-06-24 LAB — CULTURE, OB URINE
COLONY COUNT: NO GROWTH
ORGANISM ID, BACTERIA: NO GROWTH

## 2015-06-24 LAB — VITAMIN D 25 HYDROXY (VIT D DEFICIENCY, FRACTURES): VIT D 25 HYDROXY: 19 ng/mL — AB (ref 30–100)

## 2015-06-24 LAB — HIV ANTIBODY (ROUTINE TESTING W REFLEX): HIV: NONREACTIVE

## 2015-06-25 LAB — PAP, TP IMAGING W/ HPV RNA, RFLX HPV TYPE 16,18/45: HPV mRNA, High Risk: NOT DETECTED

## 2015-06-25 LAB — HEMOGLOBINOPATHY EVALUATION
HGB F QUANT: 0 % (ref 0.0–2.0)
Hemoglobin Other: 0 %
Hgb A2 Quant: 2.8 % (ref 2.2–3.2)
Hgb A: 97.2 % (ref 96.8–97.8)
Hgb S Quant: 0 %

## 2015-06-27 LAB — SURESWAB, VAGINOSIS/VAGINITIS PLUS
ATOPOBIUM VAGINAE: 7.6 Log (cells/mL)
C. ALBICANS, DNA: NOT DETECTED
C. TRACHOMATIS RNA, TMA: NOT DETECTED
C. glabrata, DNA: NOT DETECTED
C. parapsilosis, DNA: NOT DETECTED
C. tropicalis, DNA: NOT DETECTED
Gardnerella vaginalis: >8 Log (cells/mL)
LACTOBACILLUS SPECIES: NOT DETECTED Log (cells/mL)
N. gonorrhoeae RNA, TMA: NOT DETECTED
T. VAGINALIS RNA, QL TMA: NOT DETECTED

## 2015-06-30 ENCOUNTER — Ambulatory Visit (INDEPENDENT_AMBULATORY_CARE_PROVIDER_SITE_OTHER): Payer: Medicaid Other | Admitting: Obstetrics

## 2015-06-30 ENCOUNTER — Other Ambulatory Visit: Payer: Self-pay | Admitting: Certified Nurse Midwife

## 2015-06-30 ENCOUNTER — Ambulatory Visit (HOSPITAL_COMMUNITY)
Admission: RE | Admit: 2015-06-30 | Discharge: 2015-06-30 | Disposition: A | Discharge status: Home / Self Care | Payer: Medicaid Other | Source: Ambulatory Visit | Attending: Certified Nurse Midwife | Admitting: Certified Nurse Midwife

## 2015-06-30 VITALS — BP 114/73 | HR 78 | Temp 98.4°F | Wt 270.0 lb

## 2015-06-30 DIAGNOSIS — Z36 Encounter for antenatal screening of mother: Secondary | ICD-10-CM | POA: Diagnosis not present

## 2015-06-30 DIAGNOSIS — O283 Abnormal ultrasonic finding on antenatal screening of mother: Secondary | ICD-10-CM | POA: Insufficient documentation

## 2015-06-30 DIAGNOSIS — O3680X1 Pregnancy with inconclusive fetal viability, fetus 1: Secondary | ICD-10-CM

## 2015-06-30 DIAGNOSIS — Z3481 Encounter for supervision of other normal pregnancy, first trimester: Secondary | ICD-10-CM

## 2015-06-30 DIAGNOSIS — O020 Blighted ovum and nonhydatidiform mole: Secondary | ICD-10-CM

## 2015-06-30 DIAGNOSIS — O99211 Obesity complicating pregnancy, first trimester: Secondary | ICD-10-CM | POA: Insufficient documentation

## 2015-06-30 DIAGNOSIS — Z3A11 11 weeks gestation of pregnancy: Secondary | ICD-10-CM | POA: Insufficient documentation

## 2015-06-30 DIAGNOSIS — N760 Acute vaginitis: Principal | ICD-10-CM

## 2015-06-30 DIAGNOSIS — B9689 Other specified bacterial agents as the cause of diseases classified elsewhere: Secondary | ICD-10-CM

## 2015-06-30 MED ORDER — METRONIDAZOLE 500 MG PO TABS: 500.0000 mg | ORAL_TABLET | Freq: Two times a day (BID) | ORAL | Status: DC

## 2015-06-30 NOTE — Patient Instructions (Signed)
Blighted Ovum A blighted ovum is a common kind of early pregnancy failure. It happens when a fertilized egg attaches to the uterus but stops growing. Even though the egg never develops, the body acts like it is pregnant. A sac starts to form around the egg, and tissue to support a baby starts to form in the placenta. CAUSES This condition is usually caused by a genetic defect in the egg. SYMPTOMS Early symptoms of this condition are the same as those of early pregnancy. They include:  A missed menstrual period.  Fatigue.  Feeling sick to your stomach (nauseous).  Sore breasts. Later symptoms are those of pregnancy loss. They include:  Abdominal cramps.  Vaginal bleeding or spotting.  A menstrual period that is heavier than usual. DIAGNOSIS This condition is usually diagnosed during a routine ultrasound. It can be confirmed with blood tests. TREATMENT This condition may be treated by:  Waiting until your body naturally gets rid of the empty egg sac and placenta (miscarriage).  Taking medicine to start a miscarriage. This medicine can be taken by mouth or placed into the vagina.  Having a surgical procedure to remove the tissue. Your health care provider would open the entrance to your womb (dilation) and remove the tissue (curettage). HOME CARE INSTRUCTIONS  Take over-the-counter and prescription medicines only as told by your health care provider.  Talk to your health care provider about when you can try to get pregnant again. Having this condition does not mean you will lose future pregnancies.  After your miscarriage:  Rest at home for a few days.  You may bleed heavily for a week or more, and you may have light bleeding for a couple weeks after that. Wear a pad until vaginal bleeding stops. SEEK MEDICAL CARE IF:  You have a fever or chills.  Your pain medicine is not helping.  You have vaginal bleeding that continues for longer than expected. SEEK IMMEDIATE MEDICAL  CARE IF:  You have severe abdominal pain.  You feel dizzy or faint.  You pass out.  You have very heavy vaginal bleeding. A sign that vaginal bleeding is very heavy is if blood soaks through two large sanitary pads an hour for more than two hours.   This information is not intended to replace advice given to you by your health care provider. Make sure you discuss any questions you have with your health care provider.   Document Released: 10/05/2010 Document Revised: 03/11/2015 Document Reviewed: 11/05/2014 Elsevier Interactive Patient Education 2016 [employer]. Blighted Ovum A blighted ovum is a common kind of early pregnancy failure. It happens when a fertilized egg attaches to the uterus but stops growing. Even though the egg never develops, the body acts like it is pregnant. A sac starts to form around the egg, and tissue to support a baby starts to form in the placenta. CAUSES This condition is usually caused by a genetic defect in the egg. SYMPTOMS Early symptoms of this condition are the same as those of early pregnancy. They include:  A missed menstrual period.  Fatigue.  Feeling sick to your stomach (nauseous).  Sore breasts. Later symptoms are those of pregnancy loss. They include:  Abdominal cramps.  Vaginal bleeding or spotting.  A menstrual period that is heavier than usual. DIAGNOSIS This condition is usually diagnosed during a routine ultrasound. It can be confirmed with blood tests. TREATMENT This condition may be treated by:  Waiting until your body naturally gets rid of the empty egg sac  and placenta (miscarriage).  Taking medicine to start a miscarriage. This medicine can be taken by mouth or placed into the vagina.  Having a surgical procedure to remove the tissue. Your health care provider would open the entrance to your womb (dilation) and remove the tissue (curettage). HOME CARE INSTRUCTIONS  Take over-the-counter and prescription medicines only  as told by your health care provider.  Talk to your health care provider about when you can try to get pregnant again. Having this condition does not mean you will lose future pregnancies.  After your miscarriage:  Rest at home for a few days.  You may bleed heavily for a week or more, and you may have light bleeding for a couple weeks after that. Wear a pad until vaginal bleeding stops. SEEK MEDICAL CARE IF:  You have a fever or chills.  Your pain medicine is not helping.  You have vaginal bleeding that continues for longer than expected. SEEK IMMEDIATE MEDICAL CARE IF:  You have severe abdominal pain.  You feel dizzy or faint.  You pass out.  You have very heavy vaginal bleeding. A sign that vaginal bleeding is very heavy is if blood soaks through two large sanitary pads an hour for more than two hours.   This information is not intended to replace advice given to you by your health care provider. Make sure you discuss any questions you have with your health care provider.   Document Released: 10/05/2010 Document Revised: 03/11/2015 Document Reviewed: 11/05/2014 Elsevier Interactive Patient Education [employer]2016 Elsevier Inc.

## 2015-07-01 ENCOUNTER — Other Ambulatory Visit: Payer: Self-pay | Admitting: Certified Nurse Midwife

## 2015-07-01 ENCOUNTER — Encounter: Payer: Self-pay | Admitting: Obstetrics

## 2015-07-01 LAB — HCG, QUANTITATIVE, PREGNANCY: hCG, Beta Chain, Quant, S: 14842 m[IU]/mL — ABNORMAL HIGH

## 2015-07-01 NOTE — Progress Notes (Signed)
Patient ID: Ashley [name], female   DOB: 06/12/1986, [age] y.o.   MRN: [id number]  Chief Complaint  Patient presents with  . Advice Only    Fetal Demise    HPI Ashley HaberLamyre S Kren is a 29 y.o. female.  Presents for results of ultrasound, early pregnancy.  HPI  Past Medical History  Diagnosis Date  . Headache(784.0)     3X WEEK  . 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 GERD (gastroesophageal reflux disease) 2012    diet controlled - no meds  . Irregular periods/menstrual cycles 01/30/2012  . Poor social situation 01/30/2012  . Late prenatal care 01/30/2012    Past Surgical History  Procedure Laterality Date  . Cesarean section  2009  . Tonsillectomy  AGE 50  . Tonsillectomy and adenoidectomy  AGEB 18  . Cesarean section  01/30/2012    Procedure: CESAREAN SECTION;  Surgeon: Michael LitterNaima A Dillard, MD;  Location: [facility];  Service: Gynecology;  Laterality: N/A;  Repeat C/S  . Cholecystectomy N/A 10/16/2013    Procedure: LAPAROSCOPIC CHOLECYSTECTOMY WITH INTRAOPERATIVE CHOLANGIOGRAM;  Surgeon: Wilmon ArmsMatthew K. Corliss Skainssuei, MD;  Location: [facility];  Service: General;  Laterality: N/A;    Family History  Problem Relation Age of Onset  . Asthma Mother   . Hypertension Mother   . Early death Mother     AGE 947  . Cancer Maternal Grandmother     Social History Social History  Substance Use Topics  . Smoking status: Current Some Day Smoker -- 0.00 packs/day for 6 years    Types: Cigarettes  . Smokeless tobacco: Never Used  . Alcohol Use: No    No Known Allergies  Current Outpatient Prescriptions  Medication Sig Dispense Refill  . acetaminophen (TYLENOL) 500 MG tablet Take 1 tablet (500 mg total) by mouth every 6 (six) hours as needed. (Patient not taking: Reported on 06/23/2015) 30 tablet 0  . fluconazole (DIFLUCAN) 100 MG tablet Take 1 tablet (100 mg total) by mouth once. Repeat dose in 48-72 hour. 3 tablet 0  . ipratropium (ATROVENT) 0.06 % nasal spray Place 2 sprays into both nostrils 4 (four) times  daily. (Patient not taking: Reported on 06/23/2015) 15 mL 1  . metroNIDAZOLE (FLAGYL) 500 MG tablet Take 1 tablet (500 mg total) by mouth 2 (two) times daily. 14 tablet 0  . Prenat w/o A-FeCbn-DSS-FA-DHA (CITRANATAL HARMONY) 30-1-260 MG CAPS Take 1 tablet by mouth daily. 30 capsule 12  . terconazole (TERAZOL 7) 0.4 % vaginal cream Place 1 applicator vaginally at bedtime. 45 g 0   No current facility-administered medications for this visit.    Review of Systems Review of Systems Constitutional: negative for fatigue and weight loss Respiratory: negative for cough and wheezing Cardiovascular: negative for chest pain, fatigue and palpitations Gastrointestinal: negative for abdominal pain and change in bowel habits Genitourinary:negative Integument/breast: negative for nipple discharge Musculoskeletal:negative for myalgias Neurological: negative for gait problems and tremors Behavioral/Psych: negative for abusive relationship, depression Endocrine: negative for temperature intolerance     Blood pressure 114/73, pulse 78, temperature 98.4 F (36.9 C), weight 270 lb (122.471 kg), last menstrual period 04/12/2015.  Physical Exam Physical Exam: Deferred  100% of 10 min visit spent on counseling and coordination of care.   Data Reviewed Ultrasound: Intrauterine gestational sac but no yolk sac or fetal pole.  Findings suggestive of Blighted Ovum. Recommended repeat ultrasound in 10-14 days.  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  Will repeat in 48 hours.      Plan    F/U in 48 hours for repeat Quant.   Orders Placed This Encounter  Procedures  . B-HCG Quant   No orders of the defined types were placed in  this encounter.

## 2015-07-02 ENCOUNTER — Other Ambulatory Visit: Payer: Medicaid Other

## 2015-07-02 DIAGNOSIS — O020 Blighted ovum and nonhydatidiform mole: Secondary | ICD-10-CM

## 2015-07-03 LAB — HCG, QUANTITATIVE, PREGNANCY: HCG, BETA CHAIN, QUANT, S: 10015.4 m[IU]/mL — AB

## 2015-07-05 HISTORY — DX: Maternal care for unspecified type scar from previous cesarean delivery: O34.219

## 2015-07-16 ENCOUNTER — Ambulatory Visit: Payer: Medicaid Other | Admitting: Obstetrics

## 2015-07-21 ENCOUNTER — Encounter: Payer: Medicaid Other | Admitting: Certified Nurse Midwife

## 2015-07-22 ENCOUNTER — Telehealth: Payer: Self-pay | Admitting: Obstetrics

## 2015-07-23 ENCOUNTER — Encounter: Payer: Self-pay | Admitting: Obstetrics

## 2015-07-23 ENCOUNTER — Ambulatory Visit (INDEPENDENT_AMBULATORY_CARE_PROVIDER_SITE_OTHER): Payer: Medicaid Other | Admitting: Obstetrics

## 2015-07-23 ENCOUNTER — Encounter: Payer: Medicaid Other | Admitting: Certified Nurse Midwife

## 2015-07-23 ENCOUNTER — Other Ambulatory Visit: Payer: Self-pay | Admitting: *Deleted

## 2015-07-23 ENCOUNTER — Other Ambulatory Visit (INDEPENDENT_AMBULATORY_CARE_PROVIDER_SITE_OTHER): Payer: Medicaid Other

## 2015-07-23 VITALS — BP 111/76 | HR 106 | Wt 268.0 lb

## 2015-07-23 DIAGNOSIS — O034 Incomplete spontaneous abortion without complication: Secondary | ICD-10-CM | POA: Diagnosis not present

## 2015-07-23 DIAGNOSIS — O074 Failed attempted termination of pregnancy without complication: Secondary | ICD-10-CM | POA: Diagnosis not present

## 2015-07-23 DIAGNOSIS — O021 Missed abortion: Secondary | ICD-10-CM

## 2015-07-23 DIAGNOSIS — R52 Pain, unspecified: Secondary | ICD-10-CM

## 2015-07-23 MED ORDER — HYDROCODONE-IBUPROFEN 7.5-200 MG PO TABS: 1.0000 | ORAL_TABLET | Freq: Four times a day (QID) | ORAL | Status: DC | PRN

## 2015-07-23 NOTE — Progress Notes (Signed)
Patient ID: Ashley [name], female   DOB: August 19, 1985, 30 y.o.   MRN: [id number]  Chief Complaint  Patient presents with  . Advice Only    misscarriage, heavy bleeding x 2 weeks, has passed some clots    HPI Ashley [name] is a 30 y.o. female.  Has 8 week IUFD.  Passed some clots and tissue over past few days.  Now has some cramping and spotting.  HPI  Past Medical History  Diagnosis Date  . Headache(784.0)     3X WEEK  . 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 GERD (gastroesophageal reflux disease) 2012    diet controlled - no meds  . Irregular periods/menstrual cycles 01/30/2012  . Poor social situation 01/30/2012  . Late prenatal care 01/30/2012    Past Surgical History  Procedure Laterality Date  . Cesarean section  2009  . Tonsillectomy  AGE 53  . Tonsillectomy and adenoidectomy  AGEB 18  . Cesarean section  01/30/2012    Procedure: CESAREAN SECTION;  Surgeon: Michael Litter, MD;  Location: [facility];  Service: Gynecology;  Laterality: N/A;  Repeat C/S  . Cholecystectomy N/A 10/16/2013    Procedure: LAPAROSCOPIC CHOLECYSTECTOMY WITH INTRAOPERATIVE CHOLANGIOGRAM;  Surgeon: Wilmon Arms. Corliss Skains, MD;  Location: [facility];  Service: General;  Laterality: N/A;    Family History  Problem Relation Age of Onset  . Asthma Mother   . Hypertension Mother   . Early death Mother     AGE 53  . Cancer Maternal Grandmother     Social History Social History  Substance Use Topics  . Smoking status: Current Some Day Smoker -- 0.00 packs/day for 6 years    Types: Cigarettes  . Smokeless tobacco: Never Used  . Alcohol Use: No    No Known Allergies  Current Outpatient Prescriptions  Medication Sig Dispense Refill  . acetaminophen (TYLENOL) 500 MG tablet Take 1 tablet (500 mg total) by mouth every 6 (six) hours as needed. (Patient not taking: Reported on 06/23/2015) 30 tablet 0  . HYDROcodone-ibuprofen (VICOPROFEN) 7.5-200 MG tablet Take 1 tablet by mouth every 6 (six) hours as needed for  moderate pain or severe pain. 40 tablet 0  . ipratropium (ATROVENT) 0.06 % nasal spray Place 2 sprays into both nostrils 4 (four) times daily. (Patient not taking: Reported on 06/23/2015) 15 mL 1  . metroNIDAZOLE (FLAGYL) 500 MG tablet Take 1 tablet (500 mg total) by mouth 2 (two) times daily. (Patient not taking: Reported on 07/23/2015) 14 tablet 0  . Prenat w/o A-FeCbn-DSS-FA-DHA (CITRANATAL HARMONY) 30-1-260 MG CAPS Take 1 tablet by mouth daily. (Patient not taking: Reported on 07/23/2015) 30 capsule 12   No current facility-administered medications for this visit.    Review of Systems Review of Systems Constitutional: negative for fatigue and weight loss Respiratory: negative for cough and wheezing Cardiovascular: negative for chest pain, fatigue and palpitations Gastrointestinal: negative for abdominal pain and change in bowel habits Genitourinary: positive for cramping and spotting Integument/breast: negative for nipple discharge Musculoskeletal:negative for myalgias Neurological: negative for gait problems and tremors Behavioral/Psych: negative for abusive relationship, depression Endocrine: negative for temperature intolerance     Blood pressure 111/76, pulse 106, weight 268 lb (121.564 kg), last menstrual period 04/12/2015.  Physical Exam Physical Exam:  Deferred   100% of 10 min visit spent on counseling and coordination of care.   Data Reviewed Ultrasound: Ultrasound today shows retained POC  Assessment     [redacted] weeks gestation SAB, Incomplete  Plan    Suction D&E  Orders Placed This Encounter  Procedures  . US OB Transvaginal    Order Specific Question:  Reason for Exam (SYMPTOM  OR DIAGNOSIS REQUIRED)    Answer:  MAB/ ?retain products    Order Specific Question:  Preferred imaging location?    Answer:  Internal   Meds ordered this encounter  Medications  . HYDROcodone-ibuprofen (VICOPROFEN) 7.5-200 MG tablet    Sig: Take 1 tablet by mouth every 6 (six)  hours as needed for moderate pain or severe pain.    Dispense:  40 tablet    Refill:  0

## 2015-07-28 ENCOUNTER — Ambulatory Visit (HOSPITAL_COMMUNITY): Payer: Medicaid Other | Admitting: Anesthesiology

## 2015-07-28 ENCOUNTER — Ambulatory Visit (HOSPITAL_COMMUNITY)
Admission: RE | Admit: 2015-07-28 | Discharge: 2015-07-28 | Disposition: A | Discharge status: Home / Self Care | Payer: Medicaid Other | Source: Ambulatory Visit | Attending: Obstetrics | Admitting: Obstetrics

## 2015-07-28 ENCOUNTER — Other Ambulatory Visit: Payer: Self-pay | Admitting: Obstetrics

## 2015-07-28 ENCOUNTER — Encounter (HOSPITAL_COMMUNITY)
Admission: RE | Disposition: A | Discharge status: Home / Self Care | Payer: Self-pay | Source: Ambulatory Visit | Attending: Obstetrics

## 2015-07-28 ENCOUNTER — Encounter (HOSPITAL_COMMUNITY): Payer: Self-pay | Admitting: *Deleted

## 2015-07-28 DIAGNOSIS — O034 Incomplete spontaneous abortion without complication: Secondary | ICD-10-CM | POA: Insufficient documentation

## 2015-07-28 DIAGNOSIS — G8918 Other acute postprocedural pain: Secondary | ICD-10-CM

## 2015-07-28 DIAGNOSIS — O039 Complete or unspecified spontaneous abortion without complication: Secondary | ICD-10-CM

## 2015-07-28 HISTORY — PX: DILATION AND EVACUATION: SHX1459

## 2015-07-28 LAB — CBC
HCT: 36.1 % (ref 36.0–46.0)
HEMOGLOBIN: 12.4 g/dL (ref 12.0–15.0)
MCH: 27.9 pg (ref 26.0–34.0)
MCHC: 34.3 g/dL (ref 30.0–36.0)
MCV: 81.3 fL (ref 78.0–100.0)
PLATELETS: 211 10*3/uL (ref 150–400)
RBC: 4.44 MIL/uL (ref 3.87–5.11)
RDW: 13.2 % (ref 11.5–15.5)
WBC: 6.6 10*3/uL (ref 4.0–10.5)

## 2015-07-28 SURGERY — DILATION AND EVACUATION, UTERUS
Anesthesia: General

## 2015-07-28 MED ORDER — ACETAMINOPHEN 160 MG/5ML PO SOLN
ORAL | Status: AC
  Filled 2015-07-28: qty 20.3

## 2015-07-28 MED ORDER — LIDOCAINE HCL (CARDIAC) 20 MG/ML IV SOLN
INTRAVENOUS | Status: AC
  Filled 2015-07-28: qty 5

## 2015-07-28 MED ORDER — ACETAMINOPHEN 160 MG/5ML PO SOLN
975.0000 mg | Freq: Once | ORAL | Status: AC
  Administered 2015-07-28: 975 mg via ORAL

## 2015-07-28 MED ORDER — MIDAZOLAM HCL 5 MG/5ML IJ SOLN
INTRAMUSCULAR | Status: DC | PRN
  Administered 2015-07-28: 2 mg via INTRAVENOUS

## 2015-07-28 MED ORDER — ONDANSETRON HCL 4 MG/2ML IJ SOLN
INTRAMUSCULAR | Status: DC | PRN
  Administered 2015-07-28: 4 mg via INTRAVENOUS

## 2015-07-28 MED ORDER — FENTANYL CITRATE (PF) 100 MCG/2ML IJ SOLN
INTRAMUSCULAR | Status: AC
  Filled 2015-07-28: qty 2

## 2015-07-28 MED ORDER — LACTATED RINGERS IV SOLN
INTRAVENOUS | Status: DC | PRN
  Administered 2015-07-28: 09:00:00 via INTRAVENOUS

## 2015-07-28 MED ORDER — PROPOFOL 10 MG/ML IV BOLUS
INTRAVENOUS | Status: DC | PRN
  Administered 2015-07-28: 200 mg via INTRAVENOUS

## 2015-07-28 MED ORDER — KETOROLAC TROMETHAMINE 30 MG/ML IJ SOLN
INTRAMUSCULAR | Status: AC
  Filled 2015-07-28: qty 1

## 2015-07-28 MED ORDER — SCOPOLAMINE 1 MG/3DAYS TD PT72
MEDICATED_PATCH | TRANSDERMAL | Status: AC
  Administered 2015-07-28: 1.5 mg via TRANSDERMAL
  Filled 2015-07-28: qty 1

## 2015-07-28 MED ORDER — ONDANSETRON HCL 4 MG/2ML IJ SOLN
INTRAMUSCULAR | Status: AC
  Filled 2015-07-28: qty 2

## 2015-07-28 MED ORDER — SCOPOLAMINE 1 MG/3DAYS TD PT72
1.0000 | MEDICATED_PATCH | Freq: Once | TRANSDERMAL | Status: DC
  Administered 2015-07-28: 1.5 mg via TRANSDERMAL

## 2015-07-28 MED ORDER — LIDOCAINE HCL 1 % IJ SOLN
INTRAMUSCULAR | Status: DC | PRN
  Administered 2015-07-28: 20 mL

## 2015-07-28 MED ORDER — FENTANYL CITRATE (PF) 100 MCG/2ML IJ SOLN
INTRAMUSCULAR | Status: DC | PRN
  Administered 2015-07-28 (×2): 50 ug via INTRAVENOUS

## 2015-07-28 MED ORDER — LIDOCAINE HCL 1 % IJ SOLN
INTRAMUSCULAR | Status: AC
  Filled 2015-07-28: qty 20

## 2015-07-28 MED ORDER — LIDOCAINE HCL (CARDIAC) 20 MG/ML IV SOLN
INTRAVENOUS | Status: DC | PRN
  Administered 2015-07-28: 100 mg via INTRAVENOUS

## 2015-07-28 MED ORDER — OXYTOCIN 10 UNIT/ML IJ SOLN
40.0000 [IU] | INTRAVENOUS | Status: DC | PRN
  Administered 2015-07-28: 40 [IU] via INTRAVENOUS

## 2015-07-28 MED ORDER — DEXAMETHASONE SODIUM PHOSPHATE 10 MG/ML IJ SOLN
INTRAMUSCULAR | Status: AC
  Filled 2015-07-28: qty 1

## 2015-07-28 MED ORDER — SILVER NITRATE-POT NITRATE 75-25 % EX MISC
CUTANEOUS | Status: DC | PRN
  Administered 2015-07-28: 2

## 2015-07-28 MED ORDER — KETOROLAC TROMETHAMINE 30 MG/ML IJ SOLN
INTRAMUSCULAR | Status: DC | PRN
  Administered 2015-07-28: 30 mg via INTRAVENOUS

## 2015-07-28 MED ORDER — FENTANYL CITRATE (PF) 100 MCG/2ML IJ SOLN: 25.0000 ug | INTRAMUSCULAR | Status: DC | PRN

## 2015-07-28 MED ORDER — OXYCODONE-ACETAMINOPHEN 5-325 MG PO TABS: 1.0000 | ORAL_TABLET | ORAL | Status: DC | PRN

## 2015-07-28 MED ORDER — MIDAZOLAM HCL 2 MG/2ML IJ SOLN
INTRAMUSCULAR | Status: AC
  Filled 2015-07-28: qty 2

## 2015-07-28 MED ORDER — DEXAMETHASONE SODIUM PHOSPHATE 4 MG/ML IJ SOLN
INTRAMUSCULAR | Status: DC | PRN
  Administered 2015-07-28: 10 mg via INTRAVENOUS

## 2015-07-28 MED ORDER — OXYTOCIN 10 UNIT/ML IJ SOLN
INTRAMUSCULAR | Status: AC
  Filled 2015-07-28: qty 2

## 2015-07-28 MED ORDER — IBUPROFEN 800 MG PO TABS: 800.0000 mg | ORAL_TABLET | Freq: Three times a day (TID) | ORAL | Status: DC | PRN

## 2015-07-28 MED ORDER — PROPOFOL 10 MG/ML IV BOLUS
INTRAVENOUS | Status: AC
  Filled 2015-07-28: qty 20

## 2015-07-28 MED ORDER — LACTATED RINGERS IV SOLN
INTRAVENOUS | Status: DC
  Administered 2015-07-28: 08:00:00 via INTRAVENOUS

## 2015-07-28 SURGICAL SUPPLY — 20 items
CATH ROBINSON RED A/P 16FR (CATHETERS) ×3 IMPLANT
CLOTH BEACON ORANGE TIMEOUT ST (SAFETY) ×3 IMPLANT
DECANTER SPIKE VIAL GLASS SM (MISCELLANEOUS) ×7 IMPLANT
GLOVE BIO SURGEON STRL SZ8 (GLOVE) ×6 IMPLANT
GLOVE BIOGEL PI IND STRL 7.0 (GLOVE) ×1 IMPLANT
GLOVE BIOGEL PI INDICATOR 7.0 (GLOVE) ×2
GOWN STRL REUS W/TWL LRG LVL3 (GOWN DISPOSABLE) ×6 IMPLANT
KIT BERKELEY 1ST TRIMESTER 3/8 (MISCELLANEOUS) ×3 IMPLANT
NEEDLE HYPO 22GX1.5 SAFETY (NEEDLE) ×3 IMPLANT
NS IRRIG 1000ML POUR BTL (IV SOLUTION) ×3 IMPLANT
PACK VAGINAL MINOR WOMEN LF (CUSTOM PROCEDURE TRAY) ×3 IMPLANT
PAD OB MATERNITY 4.3X12.25 (PERSONAL CARE ITEMS) ×3 IMPLANT
PAD PREP 24X48 CUFFED NSTRL (MISCELLANEOUS) ×3 IMPLANT
SET BERKELEY SUCTION TUBING (SUCTIONS) ×3 IMPLANT
TOWEL OR 17X24 6PK STRL BLUE (TOWEL DISPOSABLE) ×6 IMPLANT
VACURETTE 10 RIGID CVD (CANNULA) IMPLANT
VACURETTE 12 RIGID CVD (CANNULA) ×3 IMPLANT
VACURETTE 7MM CVD STRL WRAP (CANNULA) IMPLANT
VACURETTE 8 RIGID CVD (CANNULA) ×2 IMPLANT
VACURETTE 9 RIGID CVD (CANNULA) IMPLANT

## 2015-07-28 NOTE — Telephone Encounter (Signed)
AEX HAS BEEN SCHEDULED

## 2015-07-28 NOTE — Anesthesia Postprocedure Evaluation (Signed)
Anesthesia Post Note  Patient: Ashley [name]  Procedure(s) Performed: Procedure(s) (LRB): SUCTION, DILATATION AND EVACUATION (N/A)  Patient location during evaluation: PACU Anesthesia Type: General Level of consciousness: sedated Pain management: satisfactory to patient Vital Signs Assessment: post-procedure vital signs reviewed and stable Respiratory status: spontaneous breathing Cardiovascular status: stable Anesthetic complications: no    Last Vitals:  Filed Vitals:   07/28/15 0953 07/28/15 1000  BP:  127/75  Pulse: 75 77  Temp:  36.7 C  Resp: 3 10    Last Pain:  Filed Vitals:   07/28/15 1005  PainSc: 9                  Isador Castille EDWARD

## 2015-07-28 NOTE — Anesthesia Procedure Notes (Signed)
Procedure Name: LMA Insertion Date/Time: 07/28/2015 8:51 AM Performed by: Junious Silk Pre-anesthesia Checklist: Patient identified, Emergency Drugs available, Suction available, Patient being monitored and Timeout performed Patient Re-evaluated:Patient Re-evaluated prior to inductionOxygen Delivery Method: Circle system utilized Preoxygenation: Pre-oxygenation with 100% oxygen Intubation Type: IV induction LMA: LMA inserted LMA Size: 4.0 Laser Tube: Cuffed inflated with minimal occlusive pressure - saline Placement Confirmation: positive ETCO2,  CO2 detector and breath sounds checked- equal and bilateral Dental Injury: Teeth and Oropharynx as per pre-operative assessment

## 2015-07-28 NOTE — H&P (Signed)
Ashley [name] is a 30 y.o. female presenting for D&E for incomplete SAB. Maternal Medical History:  Reason for admission: Incomplete spontaneous abortion.     OB History    Gravida Para Term Preterm AB TAB SAB Ectopic Multiple Living   Obstetric Comments   NO PRENATAL CARE WITH FIRST PREGNANCY. 36-38 WKS  GEATATION.  "DID NOT KNOW SHE WAS PREGNANT UNTIL DELIVERY."     Past Medical History  Diagnosis Date  . Headache(784.0)     3X WEEK  . 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 GERD (gastroesophageal reflux disease) 2012    diet controlled - no meds  . Irregular periods/menstrual cycles 01/30/2012  . Poor social situation 01/30/2012  . Late prenatal care 01/30/2012   Past Surgical History  Procedure Laterality Date  . Cesarean section  2009  . Tonsillectomy  AGE 81  . Tonsillectomy and adenoidectomy  AGEB 18  . Cesarean section  01/30/2012    Procedure: CESAREAN SECTION;  Surgeon: Michael Litter, MD;  Location: [facility];  Service: Gynecology;  Laterality: N/A;  Repeat C/S  . Cholecystectomy N/A 10/16/2013    Procedure: LAPAROSCOPIC CHOLECYSTECTOMY WITH INTRAOPERATIVE CHOLANGIOGRAM;  Surgeon: Wilmon Arms. Corliss Skains, MD;  Location: [facility];  Service: General;  Laterality: N/A;   Family History: family history includes Asthma in her mother; Cancer in her maternal grandmother; Early death in her mother; Hypertension in her mother. Social History:  reports that she has been smoking Cigarettes.  She has been smoking about 0.00 packs per day for the past 6 years. She has never used smokeless tobacco. She reports that she does not drink alcohol or use illicit drugs.   Prenatal Transfer Tool  Maternal Diabetes: No   ROS    Blood pressure 122/73, pulse 69, temperature 98.2 F (36.8 C), temperature source Oral, resp. rate 18, height  (1.6 m), weight 260 lb (117.935 kg), last menstrual period 04/12/2015, SpO2 99 %, unknown if currently breastfeeding. Maternal Exam:   Abdomen: Patient reports no abdominal tenderness. Introitus: Normal vulva. Normal vagina.    Physical Exam  Nursing note and vitals reviewed. Constitutional: She is oriented to person, place, and time. She appears well-developed and well-nourished.  HENT:  Head: Normocephalic and atraumatic.  Eyes: Conjunctivae are normal. Pupils are equal, round, and reactive to light.  Neck: Normal range of motion. Neck supple.  Cardiovascular: Normal rate and regular rhythm.   Respiratory: Effort normal and breath sounds normal.  GI: Soft.  Genitourinary: Vagina normal and uterus normal.  Musculoskeletal: Normal range of motion.  Neurological: She is alert and oriented to person, place, and time.  Skin: Skin is warm and dry.  Psychiatric: She has a normal mood and affect. Her behavior is normal. Judgment and thought content normal.    Prenatal labs: ABO, Rh: B/POS/-- (12/20 1030) Antibody: NEG (12/20 1030) Rubella: 3.33 (12/20 1030) RPR: NON REAC (12/20 1030)  HBsAg: NEGATIVE (12/20 1030)  HIV: NONREACTIVE (12/20 1030)  GBS:     Assessment/Plan: Incomplete SAB.  For Suction D&E.   HARPER,CHARLES A 07/28/2015, 8:36 AM

## 2015-07-28 NOTE — Anesthesia Preprocedure Evaluation (Signed)
Anesthesia Evaluation  Patient identified by MRN, date of birth, ID band Patient awake    Reviewed: Allergy & Precautions, H&P , Patient's Chart, lab work & pertinent test results, reviewed documented beta blocker date and time   Airway Mallampati: III  TM Distance: >3 FB Neck ROM: full    Dental no notable dental hx.    Pulmonary Current Smoker,    Pulmonary exam normal breath sounds clear to auscultation       Cardiovascular  Rhythm:regular Rate:Normal     Neuro/Psych    GI/Hepatic GERD  ,  Endo/Other  Morbid obesity  Renal/GU      Musculoskeletal   Abdominal   Peds  Hematology   Anesthesia Other Findings   Reproductive/Obstetrics                             Anesthesia Physical Anesthesia Plan  ASA: III  Anesthesia Plan:    Post-op Pain Management:    Induction: Intravenous  Airway Management Planned: LMA  Additional Equipment:   Intra-op Plan:   Post-operative Plan:   Informed Consent: I have reviewed the patients History and Physical, chart, labs and discussed the procedure including the risks, benefits and alternatives for the proposed anesthesia with the patient or authorized representative who has indicated his/her understanding and acceptance.   Dental Advisory Given and Dental advisory given  Plan Discussed with: CRNA and Surgeon  Anesthesia Plan Comments: (Discussed GA with LMA, possible sore throat, potential need to switch to ETT, N/V, pulmonary aspiration. Questions answered. )        Anesthesia Quick Evaluation

## 2015-07-28 NOTE — Op Note (Signed)
Preoperative diagnosis:  Incomplete spontaneous abortion, 1st trimester  Postoperative diagnosis: Same  Procedure: Suction dilatation and curretage Surgeon: HARPER,CHARLES A  Anesthesia:general  Estimated blood loss: 25ml  Urine output: 50ml  IV Fluids:  Complications: None  Specimen: PATHOLOGY  Operative Findings: POC  Description of procedure:   The patient was taken to the operating room and placed on the operating table in the semi-lithotomy position in [place] stirrups.  Examination under anesthesia was performed.  The patient was prepped and draped in the usual manner.  After a time-out had been completed, a speculum was placed in the vagina.  The anterior lip of the cervix was grasped with a single-toothed tenaculum.  A paracervical block was performed using 20 ml of 1% lidocaine.  The block was performed at 4 and 8 o'clock at the cervical vaginal junction. The cervix was dilated with Shawnie Pons dilators.  A 8 -mm suction curet was inserted in the uterine cavity.  The device was activated and the curet rotated to evacuate the products of conception.   All the instruments were removed from the vagina.  Final instrument counts were correct.  The patient was taken to the PACU in stable condition.

## 2015-07-28 NOTE — Discharge Instructions (Signed)

## 2015-07-28 NOTE — Transfer of Care (Signed)
Immediate Anesthesia Transfer of Care Note  Patient: Ashley [name]  Procedure(s) Performed: Procedure(s): SUCTION, DILATATION AND EVACUATION (N/A)  Patient Location: PACU  Anesthesia Type:General  Level of Consciousness: awake, alert  and oriented  Airway & Oxygen Therapy: Patient Spontanous Breathing and Patient connected to face mask oxygen  Post-op Assessment: Report given to RN and Post -op Vital signs reviewed and stable  Post vital signs: Reviewed and stable  Last Vitals:  Filed Vitals:   07/28/15 0756  BP: 122/73  Pulse: 69  Temp: 36.8 C  Resp: 18    Complications: No apparent anesthesia complications

## 2015-07-29 ENCOUNTER — Other Ambulatory Visit: Payer: Self-pay | Admitting: Certified Nurse Midwife

## 2015-07-29 ENCOUNTER — Encounter (HOSPITAL_COMMUNITY): Payer: Self-pay | Admitting: Obstetrics

## 2015-08-10 ENCOUNTER — Encounter: Payer: Medicaid Other | Admitting: Obstetrics

## 2015-08-11 ENCOUNTER — Ambulatory Visit (INDEPENDENT_AMBULATORY_CARE_PROVIDER_SITE_OTHER): Payer: Medicaid Other | Admitting: Obstetrics

## 2015-08-11 ENCOUNTER — Encounter: Payer: Self-pay | Admitting: *Deleted

## 2015-08-11 ENCOUNTER — Encounter: Payer: Self-pay | Admitting: Obstetrics

## 2015-08-11 VITALS — BP 127/84 | HR 84 | Temp 98.5°F | Wt 266.0 lb

## 2015-08-11 DIAGNOSIS — Z9889 Other specified postprocedural states: Secondary | ICD-10-CM

## 2015-08-11 DIAGNOSIS — Z3009 Encounter for other general counseling and advice on contraception: Secondary | ICD-10-CM

## 2015-08-11 DIAGNOSIS — O034 Incomplete spontaneous abortion without complication: Secondary | ICD-10-CM

## 2015-08-11 NOTE — Progress Notes (Signed)
Subjective:        Ashley [name] is a 30 y.o. female here for a routine exam.  S/P D&E for Incomplete SAB. Current complaints: none.    Personal health questionnaire:  Is patient [name], have a family history of breast and/or ovarian cancer: no Is there a family history of uterine cancer diagnosed at age < 23, gastrointestinal cancer, urinary tract cancer, family member who is a Personnel officer syndrome-associated carrier: no Is the patient overweight and hypertensive, family history of diabetes, personal history of gestational diabetes, preeclampsia or PCOS: no Is patient over 46, have PCOS,  family history of premature CHD under age 102, diabetes, smoke, have hypertension or peripheral artery disease:  no At any time, has a partner hit, kicked or otherwise hurt or frightened you?: no Over the past 2 weeks, have you felt down, depressed or hopeless?: no Over the past 2 weeks, have you felt little interest or pleasure in doing things?:no   Gynecologic History Patient's last menstrual period was 04/12/2015. Contraception: none Last Pap: 2016. Results were: normal Last mammogram: n/a. Results were: n/a  Obstetric History OB History  Gravida Para Term Preterm AB SAB TAB Ectopic Multiple Living  # Outcome Date GA Lbr Len/2nd Weight Sex Delivery Anes PTL Lv  3 Gravida           2 Term 01/30/12 [redacted]w[redacted]d  7 lb 2.1 oz (3.235 kg) M CS-LTranv Spinal  Y  1 Term 08/12/07 [redacted]w[redacted]d 06:00 7 lb 7 oz (3.374 kg) M CS-LTranv EPI  Y     Comments: [name]; [name]    Obstetric Comments  NO PRENATAL CARE WITH FIRST PREGNANCY. 36-38 WKS  GEATATION.  "DID NOT KNOW SHE WAS PREGNANT UNTIL DELIVERY."    Past Medical History  Diagnosis Date  . Headache(784.0)     3X WEEK  . 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 GERD (gastroesophageal reflux disease) 2012    diet controlled - no meds  . Irregular periods/menstrual cycles 01/30/2012  . Poor social situation  01/30/2012  . Late prenatal care 01/30/2012    Past Surgical History  Procedure Laterality Date  . Cesarean section  2009  . Tonsillectomy  AGE 30  . Tonsillectomy and adenoidectomy  AGEB 18  . Cesarean section  01/30/2012    Procedure: CESAREAN SECTION;  Surgeon: Michael Litter, MD;  Location: [facility];  Service: Gynecology;  Laterality: N/A;  Repeat C/S  . Cholecystectomy N/A 10/16/2013    Procedure: LAPAROSCOPIC CHOLECYSTECTOMY WITH INTRAOPERATIVE CHOLANGIOGRAM;  Surgeon: Wilmon Arms. Corliss Skains, MD;  Location: [facility];  Service: General;  Laterality: N/A;  . Dilation and evacuation N/A 07/28/2015    Procedure: SUCTION, DILATATION AND EVACUATION;  Surgeon: Brock Bad, MD;  Location: [facility];  Service: Gynecology;  Laterality: N/A;     Current outpatient prescriptions:  .  ibuprofen (ADVIL,MOTRIN) 800 MG tablet, Take 1 tablet (800 mg total) by mouth every 8 (eight) hours as needed., Disp: 30 tablet, Rfl: 5 .  Prenat w/o A-FeCbn-DSS-FA-DHA (CITRANATAL HARMONY) 30-1-260 MG CAPS, Take 1 tablet by mouth daily., Disp: 30 capsule, Rfl: 12 .  HYDROcodone-ibuprofen (VICOPROFEN) 7.5-200 MG tablet, Take 1 tablet by mouth every 6 (six) hours as needed for moderate pain or severe pain. (Patient not taking: Reported on 08/11/2015), Disp: 40 tablet, Rfl: 0 .  ipratropium (ATROVENT) 0.06 % nasal spray, Place 2 sprays into both nostrils 4 (  four) times daily. (Patient not taking: Reported on 06/23/2015), Disp: 15 mL, Rfl: 1 .  oxyCODONE-acetaminophen (ROXICET) 5-325 MG tablet, Take 1-2 tablets by mouth every 4 (four) hours as needed for severe pain. (Patient not taking: Reported on 08/11/2015), Disp: 40 tablet, Rfl: 0 No Known Allergies  Social History  Substance Use Topics  . Smoking status: Current Some Day Smoker -- 0.00 packs/day for 6 years    Types: Cigarettes  . Smokeless tobacco: Never Used  . Alcohol Use: No    Family History  Problem Relation Age of Onset  . Asthma Mother   . Hypertension Mother   .  Early death Mother     AGE 41  . Cancer Maternal Grandmother       Review of Systems  Constitutional: negative for fatigue and weight loss Respiratory: negative for cough and wheezing Cardiovascular: negative for chest pain, fatigue and palpitations Gastrointestinal: negative for abdominal pain and change in bowel habits Musculoskeletal:negative for myalgias Neurological: negative for gait problems and tremors Behavioral/Psych: negative for abusive relationship, depression Endocrine: negative for temperature intolerance   Genitourinary:negative for abnormal menstrual periods, genital lesions, hot flashes, sexual problems and vaginal discharge Integument/breast: negative for breast lump, breast tenderness, nipple discharge and skin lesion(s)    Objective:       BP 127/84 mmHg  Pulse 84  Temp(Src) 98.5 F (36.9 C)  Wt 266 lb (120.657 kg)  LMP 04/12/2015 General:   alert  Skin:   no rash or abnormalities  Lungs:   clear to auscultation bilaterally  Heart:   regular rate and rhythm, S1, S2 normal, no murmur, click, rub or gallop  Breasts:   normal without suspicious masses, skin or nipple changes or axillary nodes  Abdomen:  normal findings: no organomegaly, soft, non-tender and no hernia  Pelvis:  External genitalia: normal general appearance Urinary system: urethral meatus normal and bladder without fullness, nontender Vaginal: normal without tenderness, induration or masses Cervix: normal appearance Adnexa: normal bimanual exam Uterus: anteverted and non-tender, normal size   Lab Review Urine pregnancy test Labs reviewed yes Radiologic studies reviewed yes    Assessment:    2 week check up after D&E for Incomplete SAB  Healthy female exam.    Contraceptive counseling and advice    Plan:    Education reviewed: depression evaluation, low fat, low cholesterol diet, safe sex/STD prevention and weight bearing exercise.   Does not want contraception at this time.   Options discussed.  F/U in 9 months for annual and pap.  No orders of the defined types were placed in this encounter.   No orders of the defined types were placed in this encounter.

## 2015-11-09 ENCOUNTER — Telehealth: Payer: Self-pay | Admitting: *Deleted

## 2015-11-09 NOTE — Telephone Encounter (Signed)
Patient is concerned that her cycle has not started yet. Patient states her LMP 10/07/2015.Advised patient she should wait a little longer to see what happens. If it still does not start then she may need an appointment to evaluate why she has irregular cycles- ? PCOS, Thyroid, etc... Patient wants to be pregnant.

## 2016-01-11 ENCOUNTER — Other Ambulatory Visit (INDEPENDENT_AMBULATORY_CARE_PROVIDER_SITE_OTHER): Payer: Medicaid Other

## 2016-01-11 VITALS — BP 156/98 | HR 78 | Temp 97.9°F | Wt 280.0 lb

## 2016-01-11 DIAGNOSIS — Z3201 Encounter for pregnancy test, result positive: Secondary | ICD-10-CM | POA: Diagnosis not present

## 2016-01-11 DIAGNOSIS — N946 Dysmenorrhea, unspecified: Secondary | ICD-10-CM

## 2016-01-11 LAB — POCT URINE PREGNANCY: Preg Test, Ur: POSITIVE — AB

## 2016-01-27 ENCOUNTER — Encounter: Payer: Self-pay | Admitting: Obstetrics and Gynecology

## 2016-01-27 ENCOUNTER — Ambulatory Visit (INDEPENDENT_AMBULATORY_CARE_PROVIDER_SITE_OTHER): Payer: Medicaid Other | Admitting: Obstetrics and Gynecology

## 2016-01-27 VITALS — BP 135/76 | HR 86 | Temp 98.6°F | Ht 66.25 in | Wt 283.0 lb

## 2016-01-27 DIAGNOSIS — O34219 Maternal care for unspecified type scar from previous cesarean delivery: Secondary | ICD-10-CM

## 2016-01-27 DIAGNOSIS — Z3401 Encounter for supervision of normal first pregnancy, first trimester: Secondary | ICD-10-CM | POA: Diagnosis not present

## 2016-01-27 DIAGNOSIS — Z3480 Encounter for supervision of other normal pregnancy, unspecified trimester: Secondary | ICD-10-CM | POA: Insufficient documentation

## 2016-01-27 DIAGNOSIS — Z3481 Encounter for supervision of other normal pregnancy, first trimester: Secondary | ICD-10-CM

## 2016-01-27 LAB — POCT URINALYSIS DIPSTICK
Bilirubin, UA: NEGATIVE
GLUCOSE UA: NEGATIVE
KETONES UA: NEGATIVE
Nitrite, UA: NEGATIVE
PROTEIN UA: NEGATIVE
SPEC GRAV UA: 1.015
Urobilinogen, UA: NEGATIVE
pH, UA: 5

## 2016-01-27 NOTE — Patient Instructions (Signed)
First Trimester of Pregnancy The first trimester of pregnancy is from week 1 until the end of week 12 (months 1 through 3). A week after a sperm fertilizes an egg, the egg will implant on the wall of the uterus. This embryo will begin to develop into a baby. Genes from you and your partner are forming the baby. The female genes determine whether the baby is a boy or a girl. At 6-8 weeks, the eyes and face are formed, and the heartbeat can be seen on ultrasound. At the end of 12 weeks, all the baby's organs are formed.  Now that you are pregnant, you will want to do everything you can to have a healthy baby. Two of the most important things are to get good prenatal care and to follow your health care provider's instructions. Prenatal care is all the medical care you receive before the baby's birth. This care will help prevent, find, and treat any problems during the pregnancy and childbirth. BODY CHANGES Your body goes through many changes during pregnancy. The changes vary from woman to woman.   You may gain or lose a couple of pounds at first.  You may feel sick to your stomach (nauseous) and throw up (vomit). If the vomiting is uncontrollable, call your health care provider.  You may tire easily.  You may develop headaches that can be relieved by medicines approved by your health care provider.  You may urinate more often. Painful urination may mean you have a bladder infection.  You may develop heartburn as a result of your pregnancy.  You may develop constipation because certain hormones are causing the muscles that push waste through your intestines to slow down.  You may develop hemorrhoids or swollen, bulging veins (varicose veins).  Your breasts may begin to grow larger and become tender. Your nipples may stick out more, and the tissue that surrounds them (areola) may become darker.  Your gums may bleed and may be sensitive to brushing and flossing.  Dark spots or blotches  (chloasma, mask of pregnancy) may develop on your face. This will likely fade after the baby is born.  Your menstrual periods will stop.  You may have a loss of appetite.  You may develop cravings for certain kinds of food.  You may have changes in your emotions from day to day, such as being excited to be pregnant or being concerned that something may go wrong with the pregnancy and baby.  You may have more vivid and strange dreams.  You may have changes in your hair. These can include thickening of your hair, rapid growth, and changes in texture. Some women also have hair loss during or after pregnancy, or hair that feels dry or thin. Your hair will most likely return to normal after your baby is born. WHAT TO EXPECT AT YOUR PRENATAL VISITS During a routine prenatal visit:  You will be weighed to make sure you and the baby are growing normally.  Your blood pressure will be taken.  Your abdomen will be measured to track your baby's growth.  The fetal heartbeat will be listened to starting around week 10 or 12 of your pregnancy.  Test results from any previous visits will be discussed. Your health care provider may ask you:  How you are feeling.  If you are feeling the baby move.  If you have had any abnormal symptoms, such as leaking fluid, bleeding, severe headaches, or abdominal cramping.  If you are using any tobacco products,   including cigarettes, chewing tobacco, and electronic cigarettes.  If you have any questions. Other tests that may be performed during your first trimester include:  Blood tests to find your blood type and to check for the presence of any previous infections. They will also be used to check for low iron levels (anemia) and Rh antibodies. Later in the pregnancy, blood tests for diabetes will be done along with other tests if problems develop.  Urine tests to check for infections, diabetes, or protein in the urine.  An ultrasound to confirm the  proper growth and development of the baby.  An amniocentesis to check for possible genetic problems.  Fetal screens for spina bifida and Down syndrome.  You may need other tests to make sure you and the baby are doing well.  HIV (human immunodeficiency virus) testing. Routine prenatal testing includes screening for HIV, unless you choose not to have this test. HOME CARE INSTRUCTIONS  Medicines  Follow your health care provider's instructions regarding medicine use. Specific medicines may be either safe or unsafe to take during pregnancy.  Take your prenatal vitamins as directed.  If you develop constipation, try taking a stool softener if your health care provider approves. Diet  Eat regular, well-balanced meals. Choose a variety of foods, such as meat or vegetable-based protein, fish, milk and low-fat dairy products, vegetables, fruits, and whole grain breads and cereals. Your health care provider will help you determine the amount of weight gain that is right for you.  Avoid raw meat and uncooked cheese. These carry germs that can cause birth defects in the baby.  Eating four or five small meals rather than three large meals a day may help relieve nausea and vomiting. If you start to feel nauseous, eating a few soda crackers can be helpful. Drinking liquids between meals instead of during meals also seems to help nausea and vomiting.  If you develop constipation, eat more high-fiber foods, such as fresh vegetables or fruit and whole grains. Drink enough fluids to keep your urine clear or pale yellow. Activity and Exercise  Exercise only as directed by your health care provider. Exercising will help you:  Control your weight.  Stay in shape.  Be prepared for labor and delivery.  Experiencing pain or cramping in the lower abdomen or low back is a good sign that you should stop exercising. Check with your health care provider before continuing normal exercises.  Try to avoid  standing for long periods of time. Move your legs often if you must stand in one place for a long time.  Avoid heavy lifting.  Wear low-heeled shoes, and practice good posture.  You may continue to have sex unless your health care provider directs you otherwise. Relief of Pain or Discomfort  Wear a good support bra for breast tenderness.   Take warm sitz baths to soothe any pain or discomfort caused by hemorrhoids. Use hemorrhoid cream if your health care provider approves.   Rest with your legs elevated if you have leg cramps or low back pain.  If you develop varicose veins in your legs, wear support hose. Elevate your feet for 15 minutes, 3-4 times a day. Limit salt in your diet. Prenatal Care  Schedule your prenatal visits by the twelfth week of pregnancy. They are usually scheduled monthly at first, then more often in the last 2 months before delivery.  Write down your questions. Take them to your prenatal visits.  Keep all your prenatal visits as directed by your   health care provider. Safety  Wear your seat belt at all times when driving.  Make a list of emergency phone numbers, including numbers for family, friends, the hospital, and police and fire departments. General Tips  Ask your health care provider for a referral to a local prenatal education class. Begin classes no later than at the beginning of month 6 of your pregnancy.  Ask for help if you have counseling or nutritional needs during pregnancy. Your health care provider can offer advice or refer you to specialists for help with various needs.  Do not use hot tubs, steam rooms, or saunas.  Do not douche or use tampons or scented sanitary pads.  Do not cross your legs for long periods of time.  Avoid cat litter boxes and soil used by cats. These carry germs that can cause birth defects in the baby and possibly loss of the fetus by miscarriage or stillbirth.  Avoid all smoking, herbs, alcohol, and medicines  not prescribed by your health care provider. Chemicals in these affect the formation and growth of the baby.  Do not use any tobacco products, including cigarettes, chewing tobacco, and electronic cigarettes. If you need help quitting, ask your health care provider. You may receive counseling support and other resources to help you quit.  Schedule a dentist appointment. At home, brush your teeth with a soft toothbrush and be gentle when you floss. SEEK MEDICAL CARE IF:   You have dizziness.  You have mild pelvic cramps, pelvic pressure, or nagging pain in the abdominal area.  You have persistent nausea, vomiting, or diarrhea.  You have a bad smelling vaginal discharge.  You have pain with urination.  You notice increased swelling in your face, hands, legs, or ankles. SEEK IMMEDIATE MEDICAL CARE IF:   You have a fever.  You are leaking fluid from your vagina.  You have spotting or bleeding from your vagina.  You have severe abdominal cramping or pain.  You have rapid weight gain or loss.  You vomit blood or material that looks like coffee grounds.  You are exposed to German measles and have never had them.  You are exposed to fifth disease or chickenpox.  You develop a severe headache.  You have shortness of breath.  You have any kind of trauma, such as from a fall or a car accident.   This information is not intended to replace advice given to you by your health care provider. Make sure you discuss any questions you have with your health care provider.   Document Released: 06/14/2001 Document Revised: 07/11/2014 Document Reviewed: 04/30/2013 Elsevier Interactive Patient Education 2016 [employer]  Contraception Choices Contraception (birth control) is the use of any methods or devices to prevent pregnancy. Below are some methods to help avoid pregnancy. HORMONAL METHODS   Contraceptive implant. This is a thin, plastic tube containing progesterone hormone. It does  not contain estrogen hormone. Your health care provider inserts the tube in the inner part of the upper arm. The tube can remain in place for up to 3 years. After 3 years, the implant must be removed. The implant prevents the ovaries from releasing an egg (ovulation), thickens the cervical mucus to prevent sperm from entering the uterus, and thins the lining of the inside of the uterus.  Progesterone-only injections. These injections are given every 3 months by your health care provider to prevent pregnancy. This synthetic progesterone hormone stops the ovaries from releasing eggs. It also thickens cervical mucus and changes the   uterine lining. This makes it harder for sperm to survive in the uterus.  Birth control pills. These pills contain estrogen and progesterone hormone. They work by preventing the ovaries from releasing eggs (ovulation). They also cause the cervical mucus to thicken, preventing the sperm from entering the uterus. Birth control pills are prescribed by a health care provider.Birth control pills can also be used to treat heavy periods.  Minipill. This type of birth control pill contains only the progesterone hormone. They are taken every day of each month and must be prescribed by your health care provider.  Birth control patch. The patch contains hormones similar to those in birth control pills. It must be changed once a week and is prescribed by a health care provider.  Vaginal ring. The ring contains hormones similar to those in birth control pills. It is left in the vagina for 3 weeks, removed for 1 week, and then a new one is put back in place. The patient must be comfortable inserting and removing the ring from the vagina.A health care provider's prescription is necessary.  Emergency contraception. Emergency contraceptives prevent pregnancy after unprotected sexual intercourse. This pill can be taken right after sex or up to 5 days after unprotected sex. It is most effective  the sooner you take the pills after having sexual intercourse. Most emergency contraceptive pills are available without a prescription. Check with your pharmacist. Do not use emergency contraception as your only form of birth control. BARRIER METHODS   Female condom. This is a thin sheath (latex or rubber) that is worn over the penis during sexual intercourse. It can be used with spermicide to increase effectiveness.  Female condom. This is a soft, loose-fitting sheath that is put into the vagina before sexual intercourse.  Diaphragm. This is a soft, latex, dome-shaped barrier that must be fitted by a health care provider. It is inserted into the vagina, along with a spermicidal jelly. It is inserted before intercourse. The diaphragm should be left in the vagina for 6 to 8 hours after intercourse.  Cervical cap. This is a round, soft, latex or plastic cup that fits over the cervix and must be fitted by a health care provider. The cap can be left in place for up to 48 hours after intercourse.  Sponge. This is a soft, circular piece of polyurethane foam. The sponge has spermicide in it. It is inserted into the vagina after wetting it and before sexual intercourse.  Spermicides. These are chemicals that kill or block sperm from entering the cervix and uterus. They come in the form of creams, jellies, suppositories, foam, or tablets. They do not require a prescription. They are inserted into the vagina with an applicator before having sexual intercourse. The process must be repeated every time you have sexual intercourse. INTRAUTERINE CONTRACEPTION  Intrauterine device (IUD). This is a T-shaped device that is put in a woman's uterus during a menstrual period to prevent pregnancy. There are 2 types:  Copper IUD. This type of IUD is wrapped in copper wire and is placed inside the uterus. Copper makes the uterus and fallopian tubes produce a fluid that kills sperm. It can stay in place for 10  years.  Hormone IUD. This type of IUD contains the hormone progestin (synthetic progesterone). The hormone thickens the cervical mucus and prevents sperm from entering the uterus, and it also thins the uterine lining to prevent implantation of a fertilized egg. The hormone can weaken or kill the sperm that get into the   uterus. It can stay in place for 3-5 years, depending on which type of IUD is used. PERMANENT METHODS OF CONTRACEPTION  Female tubal ligation. This is when the woman's fallopian tubes are surgically sealed, tied, or blocked to prevent the egg from traveling to the uterus.  Hysteroscopic sterilization. This involves placing a small coil or insert into each fallopian tube. Your doctor uses a technique called hysteroscopy to do the procedure. The device causes scar tissue to form. This results in permanent blockage of the fallopian tubes, so the sperm cannot fertilize the egg. It takes about 3 months after the procedure for the tubes to become blocked. You must use another form of birth control for these 3 months.  Female sterilization. This is when the female has the tubes that carry sperm tied off (vasectomy).This blocks sperm from entering the vagina during sexual intercourse. After the procedure, the man can still ejaculate fluid (semen). NATURAL PLANNING METHODS  Natural family planning. This is not having sexual intercourse or using a barrier method (condom, diaphragm, cervical cap) on days the woman could become pregnant.  Calendar method. This is keeping track of the length of each menstrual cycle and identifying when you are fertile.  Ovulation method. This is avoiding sexual intercourse during ovulation.  Symptothermal method. This is avoiding sexual intercourse during ovulation, using a thermometer and ovulation symptoms.  Post-ovulation method. This is timing sexual intercourse after you have ovulated. Regardless of which type or method of contraception you choose, it is  important that you use condoms to protect against the transmission of sexually transmitted infections (STIs). Talk with your health care provider about which form of contraception is most appropriate for you.   This information is not intended to replace advice given to you by your health care provider. Make sure you discuss any questions you have with your health care provider.   Document Released: 06/20/2005 Document Revised: 06/25/2013 Document Reviewed: 12/13/2012 Elsevier Interactive Patient Education 2016 [employer]  Breastfeeding Deciding to breastfeed is one of the best choices you can make for you and your baby. A change in hormones during pregnancy causes your breast tissue to grow and increases the number and size of your milk ducts. These hormones also allow proteins, sugars, and fats from your blood supply to make breast milk in your milk-producing glands. Hormones prevent breast milk from being released before your baby is born as well as prompt milk flow after birth. Once breastfeeding has begun, thoughts of your baby, as well as his or her sucking or crying, can stimulate the release of milk from your milk-producing glands.  BENEFITS OF BREASTFEEDING For Your Baby  Your first milk (colostrum) helps your baby's digestive system function better.  There are antibodies in your milk that help your baby fight off infections.  Your baby has a lower incidence of asthma, allergies, and sudden infant death syndrome.  The nutrients in breast milk are better for your baby than infant formulas and are designed uniquely for your baby's needs.  Breast milk improves your baby's brain development.  Your baby is less likely to develop other conditions, such as childhood obesity, asthma, or type 2 diabetes mellitus. For You  Breastfeeding helps to create a very special bond between you and your baby.  Breastfeeding is convenient. Breast milk is always available at the correct temperature  and costs nothing.  Breastfeeding helps to burn calories and helps you lose the weight gained during pregnancy.  Breastfeeding makes your uterus contract to its   prepregnancy size faster and slows bleeding (lochia) after you give birth.   Breastfeeding helps to lower your risk of developing type 2 diabetes mellitus, osteoporosis, and breast or ovarian cancer later in life. SIGNS THAT YOUR BABY IS HUNGRY Early Signs of Hunger  Increased alertness or activity.  Stretching.  Movement of the head from side to side.  Movement of the head and opening of the mouth when the corner of the mouth or cheek is stroked (rooting).  Increased sucking sounds, smacking lips, cooing, sighing, or squeaking.  Hand-to-mouth movements.  Increased sucking of fingers or hands. Late Signs of Hunger  Fussing.  Intermittent crying. Extreme Signs of Hunger Signs of extreme hunger will require calming and consoling before your baby will be able to breastfeed successfully. Do not wait for the following signs of extreme hunger to occur before you initiate breastfeeding:  Restlessness.  A loud, strong cry.  Screaming. BREASTFEEDING BASICS Breastfeeding Initiation  Find a comfortable place to sit or lie down, with your neck and back well supported.  Place a pillow or rolled up blanket under your baby to bring him or her to the level of your breast (if you are seated). Nursing pillows are specially designed to help support your arms and your baby while you breastfeed.  Make sure that your baby's abdomen is facing your abdomen.  Gently massage your breast. With your fingertips, massage from your chest wall toward your nipple in a circular motion. This encourages milk flow. You may need to continue this action during the feeding if your milk flows slowly.  Support your breast with 4 fingers underneath and your thumb above your nipple. Make sure your fingers are well away from your nipple and your baby's  mouth.  Stroke your baby's lips gently with your finger or nipple.  When your baby's mouth is open wide enough, quickly bring your baby to your breast, placing your entire nipple and as much of the colored area around your nipple (areola) as possible into your baby's mouth.  More areola should be visible above your baby's upper lip than below the lower lip.  Your baby's tongue should be between his or her lower gum and your breast.  Ensure that your baby's mouth is correctly positioned around your nipple (latched). Your baby's lips should create a seal on your breast and be turned out (everted).  It is common for your baby to suck about 2-3 minutes in order to start the flow of breast milk. Latching Teaching your baby how to latch on to your breast properly is very important. An improper latch can cause nipple pain and decreased milk supply for you and poor weight gain in your baby. Also, if your baby is not latched onto your nipple properly, he or she may swallow some air during feeding. This can make your baby fussy. Burping your baby when you switch breasts during the feeding can help to get rid of the air. However, teaching your baby to latch on properly is still the best way to prevent fussiness from swallowing air while breastfeeding. Signs that your baby has successfully latched on to your nipple:  Silent tugging or silent sucking, without causing you pain.  Swallowing heard between every 3-4 sucks.  Muscle movement above and in front of his or her ears while sucking. Signs that your baby has not successfully latched on to nipple:  Sucking sounds or smacking sounds from your baby while breastfeeding.  Nipple pain. If you think your baby has   not latched on correctly, slip your finger into the corner of your baby's mouth to break the suction and place it between your baby's gums. Attempt breastfeeding initiation again. Signs of Successful Breastfeeding Signs from your baby:  A  gradual decrease in the number of sucks or complete cessation of sucking.  Falling asleep.  Relaxation of his or her body.  Retention of a small amount of milk in his or her mouth.  Letting go of your breast by himself or herself. Signs from you:  Breasts that have increased in firmness, weight, and size 1-3 hours after feeding.  Breasts that are softer immediately after breastfeeding.  Increased milk volume, as well as a change in milk consistency and color by the fifth day of breastfeeding.  Nipples that are not sore, cracked, or bleeding. Signs That Your Baby is Getting Enough Milk  Wetting at least 3 diapers in a 24-hour period. The urine should be clear and pale yellow by age 5 days.  At least 3 stools in a 24-hour period by age 5 days. The stool should be soft and yellow.  At least 3 stools in a 24-hour period by age 7 days. The stool should be seedy and yellow.  No loss of weight greater than 10% of birth weight during the first 3 days of age.  Average weight gain of 4-7 ounces (113-198 g) per week after age 4 days.  Consistent daily weight gain by age 5 days, without weight loss after the age of 2 weeks. After a feeding, your baby may spit up a small amount. This is common. BREASTFEEDING FREQUENCY AND DURATION Frequent feeding will help you make more milk and can prevent sore nipples and breast engorgement. Breastfeed when you feel the need to reduce the fullness of your breasts or when your baby shows signs of hunger. This is called "breastfeeding on demand." Avoid introducing a pacifier to your baby while you are working to establish breastfeeding (the first 4-6 weeks after your baby is born). After this time you may choose to use a pacifier. Research has shown that pacifier use during the first year of a baby's life decreases the risk of sudden infant death syndrome (SIDS). Allow your baby to feed on each breast as long as he or she wants. Breastfeed until your baby is  finished feeding. When your baby unlatches or falls asleep while feeding from the first breast, offer the second breast. Because newborns are often sleepy in the first few weeks of life, you may need to awaken your baby to get him or her to feed. Breastfeeding times will vary from baby to baby. However, the following rules can serve as a guide to help you ensure that your baby is properly fed:  Newborns (babies 4 weeks of age or younger) may breastfeed every 1-3 hours.  Newborns should not go longer than 3 hours during the day or 5 hours during the night without breastfeeding.  You should breastfeed your baby a minimum of 8 times in a 24-hour period until you begin to introduce solid foods to your baby at around 6 months of age. BREAST MILK PUMPING Pumping and storing breast milk allows you to ensure that your baby is exclusively fed your breast milk, even at times when you are unable to breastfeed. This is especially important if you are going back to work while you are still breastfeeding or when you are not able to be present during feedings. Your lactation consultant can give you guidelines on   how long it is safe to store breast milk. A breast pump is a machine that allows you to pump milk from your breast into a sterile bottle. The pumped breast milk can then be stored in a refrigerator or freezer. Some breast pumps are operated by hand, while others use electricity. Ask your lactation consultant which type will work best for you. Breast pumps can be purchased, but some hospitals and breastfeeding support groups lease breast pumps on a monthly basis. A lactation consultant can teach you how to hand express breast milk, if you prefer not to use a pump. CARING FOR YOUR BREASTS WHILE YOU BREASTFEED Nipples can become dry, cracked, and sore while breastfeeding. The following recommendations can help keep your breasts moisturized and healthy:  Avoid using soap on your nipples.  Wear a supportive bra.  Although not required, special nursing bras and tank tops are designed to allow access to your breasts for breastfeeding without taking off your entire bra or top. Avoid wearing underwire-style bras or extremely tight bras.  Air dry your nipples for 3-4minutes after each feeding.  Use only cotton bra pads to absorb leaked breast milk. Leaking of breast milk between feedings is normal.  Use lanolin on your nipples after breastfeeding. Lanolin helps to maintain your skin's normal moisture barrier. If you use pure lanolin, you do not need to wash it off before feeding your baby again. Pure lanolin is not toxic to your baby. You may also hand express a few drops of breast milk and gently massage that milk into your nipples and allow the milk to air dry. In the first few weeks after giving birth, some women experience extremely full breasts (engorgement). Engorgement can make your breasts feel heavy, warm, and tender to the touch. Engorgement peaks within 3-5 days after you give birth. The following recommendations can help ease engorgement:  Completely empty your breasts while breastfeeding or pumping. You may want to start by applying warm, moist heat (in the shower or with warm water-soaked hand towels) just before feeding or pumping. This increases circulation and helps the milk flow. If your baby does not completely empty your breasts while breastfeeding, pump any extra milk after he or she is finished.  Wear a snug bra (nursing or regular) or tank top for 1-2 days to signal your body to slightly decrease milk production.  Apply ice packs to your breasts, unless this is too uncomfortable for you.  Make sure that your baby is latched on and positioned properly while breastfeeding. If engorgement persists after 48 hours of following these recommendations, contact your health care provider or a lactation consultant. OVERALL HEALTH CARE RECOMMENDATIONS WHILE BREASTFEEDING  Eat healthy foods.  Alternate between meals and snacks, eating 3 of each per day. Because what you eat affects your breast milk, some of the foods may make your baby more irritable than usual. Avoid eating these foods if you are sure that they are negatively affecting your baby.  Drink milk, fruit juice, and water to satisfy your thirst (about 10 glasses a day).  Rest often, relax, and continue to take your prenatal vitamins to prevent fatigue, stress, and anemia.  Continue breast self-awareness checks.  Avoid chewing and smoking tobacco. Chemicals from cigarettes that pass into breast milk and exposure to secondhand smoke may harm your baby.  Avoid alcohol and drug use, including marijuana. Some medicines that may be harmful to your baby can pass through breast milk. It is important to ask your health care provider   before taking any medicine, including all over-the-counter and prescription medicine as well as vitamin and herbal supplements. It is possible to become pregnant while breastfeeding. If birth control is desired, ask your health care provider about options that will be safe for your baby. SEEK MEDICAL CARE IF:  You feel like you want to stop breastfeeding or have become frustrated with breastfeeding.  You have painful breasts or nipples.  Your nipples are cracked or bleeding.  Your breasts are red, tender, or warm.  You have a swollen area on either breast.  You have a fever or chills.  You have nausea or vomiting.  You have drainage other than breast milk from your nipples.  Your breasts do not become full before feedings by the fifth day after you give birth.  You feel sad and depressed.  Your baby is too sleepy to eat well.  Your baby is having trouble sleeping.   Your baby is wetting less than 3 diapers in a 24-hour period.  Your baby has less than 3 stools in a 24-hour period.  Your baby's skin or the white part of his or her eyes becomes yellow.   Your baby is not gaining  weight by 5 days of age. SEEK IMMEDIATE MEDICAL CARE IF:  Your baby is overly tired (lethargic) and does not want to wake up and feed.  Your baby develops an unexplained fever.   This information is not intended to replace advice given to you by your health care provider. Make sure you discuss any questions you have with your health care provider.   Document Released: 06/20/2005 Document Revised: 03/11/2015 Document Reviewed: 12/12/2012 Elsevier Interactive Patient Education 2016 Elsevier Inc.  

## 2016-01-27 NOTE — Progress Notes (Signed)
Subjective:    Ashley [name] is a [id number] at [redacted]w[redacted]d by LMP 11/15/2015 being seen today for her first obstetrical visit.  Her obstetrical history is significant for obesity and previous cesarean section. Patient does intend to breast feed. Pregnancy history fully reviewed.  Patient reports no complaints.  Vitals:   01/27/16 0900  BP: 135/76  Pulse: 86  Temp: 98.6 F (37 C)  TempSrc: Oral  Weight: 283 lb (128.4 kg)  Height: 5' 6.25" (1.683 m)    HISTORY: OB History  Gravida Para Term Preterm AB Living  SAB TAB Ectopic Multiple Live Births          2    # Outcome Date GA Lbr Len/2nd Weight Sex Delivery Anes PTL Lv  3 Term 01/30/12 [redacted]w[redacted]d  7 lb 2.1 oz (3.235 kg) M CS-LTranv Spinal  LIV  2 Term 08/12/07 [redacted]w[redacted]d 06:00 7 lb 7 oz (3.374 kg) M CS-LTranv EPI  LIV     Birth Comments: [name]; [name]  1 Gravida             Obstetric Comments  NO PRENATAL CARE WITH FIRST PREGNANCY. 36-38 WKS  GEATATION.  "DID NOT KNOW SHE WAS PREGNANT UNTIL DELIVERY."   Past Medical History:  Diagnosis Date  . GERD (gastroesophageal reflux disease) 2012   diet controlled - no meds  . Headache(784.0)    3X WEEK  . 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 Irregular periods/menstrual cycles 01/30/2012  . Late prenatal care 01/30/2012  . Poor social situation 01/30/2012   Past Surgical History:  Procedure Laterality Date  . CESAREAN SECTION  2009  . CESAREAN SECTION  01/30/2012   Procedure: CESAREAN SECTION;  Surgeon: Michael Litter, MD;  Location: [facility];  Service: Gynecology;  Laterality: N/A;  Repeat C/S  . CHOLECYSTECTOMY N/A 10/16/2013   Procedure: LAPAROSCOPIC CHOLECYSTECTOMY WITH INTRAOPERATIVE CHOLANGIOGRAM;  Surgeon: Wilmon Arms. Corliss Skains, MD;  Location: [facility];  Service: General;  Laterality: N/A;  . DILATION AND EVACUATION N/A 07/28/2015   Procedure: SUCTION, DILATATION AND EVACUATION;  Surgeon: Brock Bad, MD;  Location: [facility];  Service: Gynecology;   Laterality: N/A;  . TONSILLECTOMY  AGE 82  . TONSILLECTOMY AND ADENOIDECTOMY  AGEB 18   Family History  Problem Relation Age of Onset  . Asthma Mother   . Hypertension Mother   . Early death Mother     AGE 105  . Cancer Maternal Grandmother      Exam    Uterus:     Pelvic Exam:    Perineum: Normal Perineum   Vulva: normal   Vagina:  normal mucosa, normal discharge   pH:    Cervix: multiparous appearance and closed and long   Adnexa: normal adnexa and no mass, fullness, tenderness   Bony Pelvis: gynecoid  System: Breast:  normal appearance, no masses or tenderness   Skin: normal coloration and turgor, no rashes    Neurologic: oriented, no focal deficits   Extremities: normal strength, tone, and muscle mass   HEENT extra ocular movement intact   Mouth/Teeth mucous membranes moist, pharynx normal without lesions and dental hygiene good   Neck supple and no masses   Cardiovascular: regular rate and rhythm   Respiratory:  chest clear, no wheezing, crepitations, rhonchi, normal symmetric air entry   Abdomen: soft, non-tender; bowel sounds normal; no masses,  no organomegaly   Urinary:       Assessment:  Pregnancy: [id number] Patient Active Problem List   Diagnosis Date Noted  . Previous cesarean section complicating pregnancy, antepartum condition or complication 01/27/2016  . Encounter for supervision of other normal pregnancy 01/27/2016  . Symptomatic cholelithiasis 10/15/2013  . Irregular periods/menstrual cycles 01/30/2012  . Poor social situation 01/30/2012  . Late prenatal care 01/30/2012  . Obesity 11/13/2011        Plan:     Initial labs drawn. Prenatal vitamins. Problem list reviewed and updated. Genetic Screening discussed First Screen: ordered.  Ultrasound discussed; fetal survey: requested.  Follow up in 4 weeks. 50% of 30 min visit spent on counseling and coordination of care.     Ashley [name] 01/27/2016

## 2016-01-27 NOTE — Addendum Note (Signed)
Addended by: Elby Beck F on: 01/27/2016 10:58 AM   Modules accepted: Orders

## 2016-01-29 LAB — GC/CHLAMYDIA PROBE AMP
CHLAMYDIA, DNA PROBE: NEGATIVE
Neisseria gonorrhoeae by PCR: NEGATIVE

## 2016-02-01 LAB — URINE CULTURE, OB REFLEX

## 2016-02-01 LAB — CULTURE, OB URINE

## 2016-02-03 ENCOUNTER — Encounter (HOSPITAL_COMMUNITY): Payer: Self-pay | Admitting: Obstetrics and Gynecology

## 2016-02-04 ENCOUNTER — Encounter: Payer: Self-pay | Admitting: Obstetrics and Gynecology

## 2016-02-04 DIAGNOSIS — O99820 Streptococcus B carrier state complicating pregnancy: Secondary | ICD-10-CM | POA: Insufficient documentation

## 2016-02-04 DIAGNOSIS — O99320 Drug use complicating pregnancy, unspecified trimester: Secondary | ICD-10-CM | POA: Insufficient documentation

## 2016-02-04 LAB — PRENATAL PROFILE I(LABCORP)
ANTIBODY SCREEN: NEGATIVE
BASOS ABS: 0 10*3/uL (ref 0.0–0.2)
Basos: 0 %
EOS (ABSOLUTE): 0.2 10*3/uL (ref 0.0–0.4)
EOS: 3 %
HEMOGLOBIN: 12.2 g/dL (ref 11.1–15.9)
HEP B S AG: NEGATIVE
Hematocrit: 36.9 % (ref 34.0–46.6)
IMMATURE GRANS (ABS): 0 10*3/uL (ref 0.0–0.1)
IMMATURE GRANULOCYTES: 1 %
LYMPHS: 26 %
Lymphocytes Absolute: 2.1 10*3/uL (ref 0.7–3.1)
MCH: 27.1 pg (ref 26.6–33.0)
MCHC: 33.1 g/dL (ref 31.5–35.7)
MCV: 82 fL (ref 79–97)
Monocytes Absolute: 0.3 10*3/uL (ref 0.1–0.9)
Monocytes: 4 %
NEUTROS PCT: 66 %
Neutrophils Absolute: 5.4 10*3/uL (ref 1.4–7.0)
Platelets: 221 10*3/uL (ref 150–379)
RBC: 4.5 x10E6/uL (ref 3.77–5.28)
RDW: 15.5 % — ABNORMAL HIGH (ref 12.3–15.4)
RH TYPE: POSITIVE
RPR Ser Ql: NONREACTIVE
RUBELLA: 2.9 {index} (ref >0.99)
WBC: 8.2 10*3/uL (ref 3.4–10.8)

## 2016-02-04 LAB — RUBELLA ANTIBODY, IGM: Rubella IgM: <20 AU/mL (ref 0.0–19.9)

## 2016-02-04 LAB — TOXASSURE SELECT 13 (MW), URINE: PDF: 0

## 2016-02-04 LAB — VARICELLA ZOSTER ANTIBODY, IGG: Varicella zoster IgG: 2596 index (ref >165)

## 2016-02-04 LAB — HIV ANTIBODY (ROUTINE TESTING W REFLEX): HIV SCREEN 4TH GENERATION: NONREACTIVE

## 2016-02-04 LAB — MEASLES/MUMPS/RUBELLA IMMUNITY
MUMPS ABS, IGG: 38.3 [AU]/ml (ref >10.9)
RUBEOLA AB, IGG: >300 AU/mL (ref >29.9)

## 2016-02-10 ENCOUNTER — Ambulatory Visit (HOSPITAL_COMMUNITY): Payer: Medicaid Other

## 2016-02-10 ENCOUNTER — Ambulatory Visit (HOSPITAL_COMMUNITY): Admission: RE | Admit: 2016-02-10 | Payer: Medicaid Other | Source: Ambulatory Visit

## 2016-02-24 ENCOUNTER — Ambulatory Visit (INDEPENDENT_AMBULATORY_CARE_PROVIDER_SITE_OTHER): Payer: Medicaid Other | Admitting: Obstetrics and Gynecology

## 2016-02-24 VITALS — BP 114/74 | HR 102 | Temp 98.7°F | Wt 284.7 lb

## 2016-02-24 DIAGNOSIS — O99820 Streptococcus B carrier state complicating pregnancy: Secondary | ICD-10-CM

## 2016-02-24 DIAGNOSIS — Z3492 Encounter for supervision of normal pregnancy, unspecified, second trimester: Secondary | ICD-10-CM

## 2016-02-24 DIAGNOSIS — O34219 Maternal care for unspecified type scar from previous cesarean delivery: Secondary | ICD-10-CM

## 2016-02-24 DIAGNOSIS — Z3482 Encounter for supervision of other normal pregnancy, second trimester: Secondary | ICD-10-CM

## 2016-02-24 LAB — POCT URINALYSIS DIPSTICK
Glucose, UA: NEGATIVE
Ketones, UA: NEGATIVE
Leukocytes, UA: NEGATIVE
NITRITE UA: NEGATIVE
SPEC GRAV UA: 1.02
UROBILINOGEN UA: 0.2
pH, UA: 5

## 2016-02-24 NOTE — Progress Notes (Signed)
Subjective:  Ashley [name] is a 30 y.o. [id number] at 6517w4d being seen today for ongoing prenatal care.  She is currently monitored for the following issues for this low-risk pregnancy and has Obesity; Irregular periods/menstrual cycles; Poor social situation; Late prenatal care; Symptomatic cholelithiasis; Previous cesarean section complicating pregnancy, antepartum condition or complication; Encounter for supervision of other normal pregnancy; GBS (group B Streptococcus carrier), +RV culture, currently pregnant; and Substance abuse complicating pregnancy, antepartum on her problem list.  Patient reports no complaints.  Contractions: Not present. Vag. Bleeding: None.   . Denies leaking of fluid.   The following portions of the patient's history were reviewed and updated as appropriate: allergies, current medications, past family history, past medical history, past social history, past surgical history and problem list. Problem list updated.  Objective:   Vitals:   02/24/16 1450  BP: 114/74  Pulse: (!) 102  Temp: 98.7 F (37.1 C)  Weight: 284 lb 11.2 oz (129.1 kg)    Fetal Status: Fetal Heart Rate (bpm): 156         General:  Alert, oriented and cooperative. Patient is in no acute distress.  Skin: Skin is warm and dry. No rash noted.   Cardiovascular: Normal heart rate noted  Respiratory: Normal respiratory effort, no problems with respiration noted  Abdomen: Soft, gravid, appropriate for gestational age. Pain/Pressure: Present     Pelvic:  Cervical exam deferred        Extremities: Normal range of motion.  Edema: None  Mental Status: Normal mood and affect. Normal behavior. Normal judgment and thought content.   Urinalysis:      Assessment and Plan:  Pregnancy: [id number] at 6517w4d  1. Prenatal care, second trimester  - POCT Urinalysis Dipstick  2. Encounter for supervision of other normal pregnancy in second trimester Patient is without complaints Anatomy ultrasound  ordered Patient declined quad screen - US OB Comp + 14 Wk; Future  3. GBS (group B Streptococcus carrier), +RV culture, currently pregnant   4. Previous cesarean section complicating pregnancy, antepartum condition or complication Will need repeat  General obstetric precautions including but not limited to vaginal bleeding, contractions, leaking of fluid and fetal movement were reviewed in detail with the patient. Please refer to After Visit Summary for other counseling recommendations.  Return in about 4 weeks (around 03/23/2016).   Catalina AntiguaPeggy Krisandra Bueno, MD

## 2016-02-24 NOTE — Progress Notes (Signed)
Pt c/o pressure in upper abdomen and tingling in right arm/hand.

## 2016-03-08 ENCOUNTER — Ambulatory Visit: Payer: Medicaid Other | Admitting: Certified Nurse Midwife

## 2016-03-23 ENCOUNTER — Encounter: Payer: Medicaid Other | Admitting: Certified Nurse Midwife

## 2016-03-24 ENCOUNTER — Ambulatory Visit (INDEPENDENT_AMBULATORY_CARE_PROVIDER_SITE_OTHER): Payer: Medicaid Other | Admitting: Obstetrics and Gynecology

## 2016-03-24 ENCOUNTER — Encounter: Payer: Self-pay | Admitting: Obstetrics and Gynecology

## 2016-03-24 ENCOUNTER — Ambulatory Visit (INDEPENDENT_AMBULATORY_CARE_PROVIDER_SITE_OTHER): Payer: Medicaid Other

## 2016-03-24 VITALS — BP 128/80 | HR 90 | Temp 98.1°F | Wt 285.0 lb

## 2016-03-24 DIAGNOSIS — O34219 Maternal care for unspecified type scar from previous cesarean delivery: Secondary | ICD-10-CM

## 2016-03-24 DIAGNOSIS — Z3482 Encounter for supervision of other normal pregnancy, second trimester: Secondary | ICD-10-CM

## 2016-03-24 DIAGNOSIS — R8271 Bacteriuria: Secondary | ICD-10-CM

## 2016-03-24 DIAGNOSIS — Z3169 Encounter for other general counseling and advice on procreation: Secondary | ICD-10-CM

## 2016-03-24 DIAGNOSIS — Z23 Encounter for immunization: Secondary | ICD-10-CM

## 2016-03-24 DIAGNOSIS — O99820 Streptococcus B carrier state complicating pregnancy: Secondary | ICD-10-CM

## 2016-03-24 DIAGNOSIS — Z36 Encounter for antenatal screening of mother: Secondary | ICD-10-CM

## 2016-03-24 MED ORDER — PRENATAL VITAMINS 0.8 MG PO TABS: 1.0000 | ORAL_TABLET | Freq: Every day | ORAL | 12 refills | Status: DC

## 2016-03-24 NOTE — Progress Notes (Signed)
.     PRENATAL VISIT NOTE  Subjective:  Ashley [name] is a 30 y.o. [id number] at 9260w5d being seen today for ongoing prenatal care.  She is currently monitored for the following issues for this low-risk pregnancy and has Obesity; Irregular periods/menstrual cycles; Symptomatic cholelithiasis; Previous cesarean section complicating pregnancy, antepartum condition or complication; Encounter for supervision of other normal pregnancy; Substance abuse complicating pregnancy, antepartum; and GBS bacteriuria on her problem list.  Patient reports no complaints.  Contractions: Not present. Vag. Bleeding: None.  Movement: Present. Denies leaking of fluid.   The following portions of the patient's history were reviewed and updated as appropriate: allergies, current medications, past family history, past medical history, past social history, past surgical history and problem list. Problem list updated.  Objective:   Vitals:   03/24/16 1009  BP: 128/80  Pulse: 90  Temp: 98.1 F (36.7 C)  Weight: 285 lb (129.3 kg)    Fetal Status: Fetal Heart Rate (bpm): 145   Movement: Present     General:  Alert, oriented and cooperative. Patient is in no acute distress.  Skin: Skin is warm and dry. No rash noted.   Cardiovascular: Normal heart rate noted  Respiratory: Normal respiratory effort, no problems with respiration noted  Abdomen: Soft, gravid, appropriate for gestational age. Pain/Pressure: Absent     Pelvic:  Cervical exam deferred        Extremities: Normal range of motion.  Edema: Trace  Mental Status: Normal mood and affect. Normal behavior. Normal judgment and thought content.   Urinalysis:      Assessment and Plan:  Pregnancy: [id number] at 4060w5d  1. GBS bacteriuria Will need prophylaxis if PROM  2. Previous cesarean section complicating pregnancy, antepartum condition or complication Will be scheduled for repeat with BTL  3. Encounter for supervision of other normal pregnancy in second  trimester Patient is doing well  Anatomy ultrasound was performed today. Discussed returning prior to her next appointment for early 2 hour glucola Flu vaccine today  General obstetric precautions including but not limited to vaginal bleeding, contractions, leaking of fluid and fetal movement were reviewed in detail with the patient. Please refer to After Visit Summary for other counseling recommendations.  No Follow-up on file.  Catalina AntiguaPeggy Meeka Cartelli, MD

## 2016-03-24 NOTE — Progress Notes (Signed)
Patient stated that she is overall well other than constant headaches and pain in her right arm and hands.

## 2016-03-31 ENCOUNTER — Ambulatory Visit: Payer: Medicaid Other | Admitting: Certified Nurse Midwife

## 2016-04-05 ENCOUNTER — Other Ambulatory Visit: Payer: Self-pay

## 2016-04-07 ENCOUNTER — Other Ambulatory Visit: Payer: Medicaid Other

## 2016-04-21 ENCOUNTER — Ambulatory Visit (INDEPENDENT_AMBULATORY_CARE_PROVIDER_SITE_OTHER): Payer: Medicaid Other | Admitting: Obstetrics and Gynecology

## 2016-04-21 ENCOUNTER — Encounter: Payer: Self-pay | Admitting: Obstetrics and Gynecology

## 2016-04-21 VITALS — BP 130/80 | HR 109 | Temp 98.0°F | Wt 287.2 lb

## 2016-04-21 DIAGNOSIS — R8271 Bacteriuria: Secondary | ICD-10-CM

## 2016-04-21 DIAGNOSIS — O34219 Maternal care for unspecified type scar from previous cesarean delivery: Secondary | ICD-10-CM

## 2016-04-21 DIAGNOSIS — Z3482 Encounter for supervision of other normal pregnancy, second trimester: Secondary | ICD-10-CM

## 2016-04-21 NOTE — Progress Notes (Signed)
   PRENATAL VISIT NOTE  Subjective:  Ashley [name] is a 30 y.o. [id number] at 4231w5d being seen today for ongoing prenatal care.  She is currently monitored for the following issues for this high-risk pregnancy and has Obesity; Irregular periods/menstrual cycles; Symptomatic cholelithiasis; Previous cesarean section complicating pregnancy, antepartum condition or complication; Encounter for supervision of other normal pregnancy; Substance abuse complicating pregnancy, antepartum; and GBS bacteriuria on her problem list.  Patient reports no complaints.   .  .   . Denies leaking of fluid.   The following portions of the patient's history were reviewed and updated as appropriate: allergies, current medications, past family history, past medical history, past social history, past surgical history and problem list. Problem list updated.  Objective:  There were no vitals filed for this visit.  Fetal Status: Fetal Heart Rate (bpm): 160 Fundal Height: 24 cm       General:  Alert, oriented and cooperative. Patient is in no acute distress.  Skin: Skin is warm and dry. No rash noted.   Cardiovascular: Normal heart rate noted  Respiratory: Normal respiratory effort, no problems with respiration noted  Abdomen: Soft, gravid, appropriate for gestational age.       Pelvic:  Cervical exam deferred        Extremities: Normal range of motion.     Mental Status: Normal mood and affect. Normal behavior. Normal judgment and thought content.   Assessment and Plan:  Pregnancy: [id number] at 8031w5d  1. Previous cesarean section complicating pregnancy, antepartum condition or complication Patient will be scheduled for repeat with BTL   2. Encounter for supervision of other normal pregnancy in second trimester Patient is doing well without complaints Ultrasound results reviewed with the patient. Follow up ultrasound to complete anatomy ordered Third trimester labs and glucola next visit - US OB Follow Up;  Future  3. GBS bacteriuria   Preterm labor symptoms and general obstetric precautions including but not limited to vaginal bleeding, contractions, leaking of fluid and fetal movement were reviewed in detail with the patient. Please refer to After Visit Summary for other counseling recommendations.  Return in about 4 weeks (around 05/19/2016) for ROB/2hr glucola.  Catalina AntiguaPeggy Khayri Kargbo, MD

## 2016-05-10 ENCOUNTER — Ambulatory Visit: Payer: Medicaid Other

## 2016-05-10 DIAGNOSIS — Z3482 Encounter for supervision of other normal pregnancy, second trimester: Secondary | ICD-10-CM

## 2016-05-19 ENCOUNTER — Encounter: Payer: Medicaid Other | Admitting: Obstetrics & Gynecology

## 2016-05-20 ENCOUNTER — Other Ambulatory Visit: Payer: Medicaid Other

## 2016-05-20 ENCOUNTER — Encounter: Payer: Medicaid Other | Admitting: Obstetrics

## 2016-05-24 ENCOUNTER — Other Ambulatory Visit: Payer: Medicaid Other

## 2016-06-02 ENCOUNTER — Other Ambulatory Visit: Payer: Medicaid Other

## 2016-06-02 DIAGNOSIS — Z3490 Encounter for supervision of normal pregnancy, unspecified, unspecified trimester: Secondary | ICD-10-CM

## 2016-06-03 LAB — HIV ANTIBODY (ROUTINE TESTING W REFLEX): HIV Screen 4th Generation wRfx: NONREACTIVE

## 2016-06-03 LAB — CBC
Hematocrit: 33.9 % — ABNORMAL LOW (ref 34.0–46.6)
Hemoglobin: 10.9 g/dL — ABNORMAL LOW (ref 11.1–15.9)
MCH: 25.6 pg — ABNORMAL LOW (ref 26.6–33.0)
MCHC: 32.2 g/dL (ref 31.5–35.7)
MCV: 80 fL (ref 79–97)
Platelets: 189 x10E3/uL (ref 150–379)
RBC: 4.26 x10E6/uL (ref 3.77–5.28)
RDW: 15.4 % (ref 12.3–15.4)
WBC: 8.3 x10E3/uL (ref 3.4–10.8)

## 2016-06-03 LAB — RPR: RPR Ser Ql: NONREACTIVE

## 2016-06-03 LAB — GLUCOSE TOLERANCE, 2 HOURS W/ 1HR
Glucose, 1 hour: 121 mg/dL (ref 65–179)
Glucose, 2 hour: 100 mg/dL (ref 65–152)
Glucose, Fasting: 81 mg/dL (ref 65–91)

## 2016-06-09 ENCOUNTER — Encounter: Payer: Medicaid Other | Admitting: Certified Nurse Midwife

## 2016-06-09 ENCOUNTER — Encounter: Payer: Medicaid Other | Admitting: Obstetrics

## 2016-06-14 ENCOUNTER — Encounter: Payer: Medicaid Other | Admitting: Obstetrics and Gynecology

## 2016-06-16 ENCOUNTER — Encounter: Payer: Self-pay | Admitting: Obstetrics

## 2016-06-16 ENCOUNTER — Ambulatory Visit (INDEPENDENT_AMBULATORY_CARE_PROVIDER_SITE_OTHER): Payer: Medicaid Other | Admitting: Obstetrics

## 2016-06-16 DIAGNOSIS — Z23 Encounter for immunization: Secondary | ICD-10-CM

## 2016-06-16 DIAGNOSIS — Z3480 Encounter for supervision of other normal pregnancy, unspecified trimester: Secondary | ICD-10-CM

## 2016-06-16 DIAGNOSIS — Z3483 Encounter for supervision of other normal pregnancy, third trimester: Secondary | ICD-10-CM

## 2016-06-16 MED ORDER — RANITIDINE HCL 150 MG PO TABS: 150.0000 mg | ORAL_TABLET | Freq: Two times a day (BID) | ORAL | 6 refills | Status: DC

## 2016-06-16 NOTE — Progress Notes (Signed)
Subjective:    Ernest HaberLamyre S Lekas is a 30 y.o. female being seen today for her obstetrical visit. She is at 2031w5d gestation. Patient reports no complaints. Fetal movement: normal.  Problem List Items Addressed This Visit    Encounter for supervision of other normal pregnancy   Relevant Orders   Tdap vaccine greater than or equal to 32yo IM (Completed)     Patient Active Problem List   Diagnosis Date Noted  . GBS bacteriuria 03/24/2016  . Substance abuse complicating pregnancy, antepartum 02/04/2016  . Previous cesarean section complicating pregnancy, antepartum condition or complication 01/27/2016  . Encounter for supervision of other normal pregnancy 01/27/2016  . Symptomatic cholelithiasis 10/15/2013  . Irregular periods/menstrual cycles 01/30/2012  . Obesity 11/13/2011   Objective:    BP 128/69   Pulse (!) 103   Wt 284 lb (128.8 kg)   LMP 11/14/2015   BMI 45.49 kg/m   FHT:  150 BPM  Uterine Size: size equals dates  Presentation: unsure     Assessment:    Pregnancy @ 4731w5d weeks   Plan:     labs reviewed, problem list updated Consent signed. GBS sent TDAP offered  Rhogam given for RH negative Pediatrician: discussed. Infant feeding: plans to breastfeed. Maternity leave: discussed. Cigarette smoking:  smoker. Orders Placed This Encounter  Procedures  . Procedure Report - Scanned    This order was created through External Result Entry  . Tdap vaccine greater than or equal to 32yo IM   Meds ordered this encounter  Medications  . ranitidine (ZANTAC) 150 MG tablet    Sig: Take 1 tablet (150 mg total) by mouth 2 (two) times daily.    Dispense:  60 tablet    Refill:  6   Follow up in 2 Weeks.   Patient ID: Ernest HaberLamyre S Spray, female   DOB: 12/06/1985, 30 y.o.   MRN: [id number]

## 2016-06-16 NOTE — Progress Notes (Signed)
Pt desires medication for heartburn.

## 2016-06-17 ENCOUNTER — Encounter (HOSPITAL_COMMUNITY): Payer: Self-pay | Admitting: *Deleted

## 2016-06-28 ENCOUNTER — Encounter: Payer: Medicaid Other | Admitting: Obstetrics

## 2016-06-30 ENCOUNTER — Encounter: Payer: Medicaid Other | Admitting: Obstetrics

## 2016-07-05 ENCOUNTER — Ambulatory Visit (INDEPENDENT_AMBULATORY_CARE_PROVIDER_SITE_OTHER): Payer: Medicaid Other | Admitting: Obstetrics

## 2016-07-05 ENCOUNTER — Encounter: Payer: Self-pay | Admitting: Obstetrics

## 2016-07-05 VITALS — BP 119/75 | HR 101 | Wt 280.6 lb

## 2016-07-05 DIAGNOSIS — Z3483 Encounter for supervision of other normal pregnancy, third trimester: Secondary | ICD-10-CM

## 2016-07-05 DIAGNOSIS — Z3480 Encounter for supervision of other normal pregnancy, unspecified trimester: Secondary | ICD-10-CM

## 2016-07-05 NOTE — Progress Notes (Signed)
Patient reports she has a lot of movement. Some tightness at times.

## 2016-07-05 NOTE — Patient Instructions (Addendum)
Group B Streptococcus Infection During Pregnancy Group B Streptococcus (GBS) is a type of bacteria (Streptococcus agalactiae) that is often found in healthy people, commonly in the rectum, vagina, and intestines. In people who are healthy and not pregnant, the bacteria rarely cause serious illness or complications. However, women who test positive for GBS during pregnancy can pass the bacteria to their baby during childbirth, which can cause serious infection in the baby after birth. Women with GBS may also have infections during their pregnancy or immediately after childbirth, such as such as urinary tract infections (UTIs) or infections of the uterus (uterine infections). Having GBS also increases a woman's risk of complications during pregnancy, such as early (preterm) labor or delivery, miscarriage, or stillbirth. Routine testing (screening) for GBS is recommended for all pregnant women. What increases the risk? You may have a higher risk for GBS infection during pregnancy if you had one during a past pregnancy. What are the signs or symptoms? In most cases, GBS infection does not cause symptoms in pregnant women. Signs and symptoms of a possible GBS-related infection may include:  Labor starting before the 37th week of pregnancy.  A UTI or bladder infection, which may cause:  Fever.  Pain or burning during urination.  Frequent urination.  Fever during labor, along with:  Bad-smelling discharge.  Uterine tenderness.  Rapid heartbeat in the mother, baby, or both. Rare but serious symptoms of a possible GBS-related infection in women include:  Blood infection (septicemia). This may cause fever, chills, or confusion.  Lung infection (pneumonia). This may cause fever, chills, cough, rapid breathing, difficulty breathing, or chest pain.  Bone, joint, skin, or soft tissue infection. How is this diagnosed? You may be screened for GBS between week 35 and week 37 of your pregnancy. If you  have symptoms of preterm labor, you may be screened earlier. This condition is diagnosed based on lab test results from:  A swab of fluid from the vagina and rectum.  A urine sample. How is this treated? This condition is treated with antibiotic medicine. When you go into labor, or as soon as your water breaks (your membranes rupture), you will be given antibiotics through an IV tube. Antibiotics will continue until after you give birth. If you are having a cesarean delivery, you do not need antibiotics unless your membranes have already ruptured. Follow these instructions at home:  Take over-the-counter and prescription medicines only as told by your health care provider.  Take your antibiotic medicine as told by your health care provider. Do not stop taking the antibiotic even if you start to feel better.  Keep all pre-birth (prenatal) visits and follow-up visits as told by your health care provider. This is important. Contact a health care provider if:  You have pain or burning when you urinate.  You have to urinate frequently.  You have a fever or chills.  You develop a bad-smelling vaginal discharge. Get help right away if:  Your membranes rupture.  You go into labor.  You have severe pain in your abdomen.  You have difficulty breathing.  You have chest pain. This information is not intended to replace advice given to you by your health care provider. Make sure you discuss any questions you have with your health care provider. Document Released: 09/27/2007 Document Revised: 01/15/2016 Document Reviewed: 01/14/2016 Elsevier Interactive Patient Education  2017 Else[employer] Inc.  

## 2016-07-05 NOTE — Progress Notes (Signed)
Subjective:    Ashley [name] is a 31 y.o. female being seen today for her obstetrical visit. She is at 172w3d gestation. Patient reports heartburn. Fetal movement: normal.  Problem List Items Addressed This Visit    None     Patient Active Problem List   Diagnosis Date Noted  . GBS bacteriuria 03/24/2016  . Substance abuse complicating pregnancy, antepartum 02/04/2016  . Previous cesarean section complicating pregnancy, antepartum condition or complication 01/27/2016  . Encounter for supervision of other normal pregnancy 01/27/2016  . Symptomatic cholelithiasis 10/15/2013  . Irregular periods/menstrual cycles 01/30/2012  . Obesity 11/13/2011   Objective:    BP 119/75   Pulse (!) 101   Wt 280 lb 9.6 oz (127.3 kg)   LMP 11/14/2015   BMI 44.95 kg/m  FHT:  150 BPM  Uterine Size: size greater than dates  Presentation: unsure     Assessment:    Pregnancy @ 372w3d weeks   Plan:     labs reviewed, problem list updated Consent signed. GBS sent TDAP offered  Rhogam given for RH negative Pediatrician: discussed. Infant feeding: plans to breastfeed. Maternity leave: discussed. Cigarette smoking: smoker. No orders of the defined types were placed in this encounter.  No orders of the defined types were placed in this encounter.  Follow up in 2 Weeks.   Patient ID: Ashley [name], female   DOB: 08/02/1985, 31 y.o.   MRN: [id number]

## 2016-07-19 ENCOUNTER — Ambulatory Visit (INDEPENDENT_AMBULATORY_CARE_PROVIDER_SITE_OTHER): Payer: Medicaid Other | Admitting: Obstetrics

## 2016-07-19 ENCOUNTER — Encounter: Payer: Self-pay | Admitting: Obstetrics

## 2016-07-19 VITALS — BP 116/71 | HR 101 | Wt 284.6 lb

## 2016-07-19 DIAGNOSIS — Z3480 Encounter for supervision of other normal pregnancy, unspecified trimester: Secondary | ICD-10-CM

## 2016-07-19 DIAGNOSIS — R12 Heartburn: Secondary | ICD-10-CM

## 2016-07-19 DIAGNOSIS — O99613 Diseases of the digestive system complicating pregnancy, third trimester: Secondary | ICD-10-CM

## 2016-07-19 DIAGNOSIS — K5900 Constipation, unspecified: Secondary | ICD-10-CM

## 2016-07-19 MED ORDER — RANITIDINE HCL 150 MG PO TABS: 150.0000 mg | ORAL_TABLET | Freq: Two times a day (BID) | ORAL | 6 refills | Status: DC

## 2016-07-19 MED ORDER — DOCUSATE SODIUM 100 MG PO CAPS: 100.0000 mg | ORAL_CAPSULE | Freq: Two times a day (BID) | ORAL | 5 refills | Status: DC

## 2016-07-19 NOTE — Progress Notes (Signed)
Pt c/o low back pain, acid reflux, and constipation.

## 2016-07-19 NOTE — Progress Notes (Signed)
[place]  PPDubjective:    Ernest HaberLamyre S Brier is a 31 y.o. female being seen today for her obstetrical visit. She is at 3242w3d gestation. Patient reports heartburn. Fetal movement: normal.  Problem List Items Addressed This Visit    Encounter for supervision of other normal pregnancy - Primary   Relevant Orders   Strep Gp B NAA    Other Visit Diagnoses    Heartburn       Relevant Medications   ranitidine (ZANTAC) 150 MG tablet   Constipation during pregnancy in third trimester       Relevant Medications   docusate sodium (COLACE) 100 MG capsule     Patient Active Problem List   Diagnosis Date Noted  . GBS bacteriuria 03/24/2016  . Substance abuse complicating pregnancy, antepartum 02/04/2016  . Previous cesarean section complicating pregnancy, antepartum condition or complication 01/27/2016  . Encounter for supervision of other normal pregnancy 01/27/2016  . Symptomatic cholelithiasis 10/15/2013  . Irregular periods/menstrual cycles 01/30/2012  . Obesity 11/13/2011   Objective:    BP 116/71   Pulse (!) 101   Wt 284 lb 9.6 oz (129.1 kg)   LMP 11/14/2015   BMI 45.59 kg/m   FHT:  150 BPM  Uterine Size: size equals dates  Presentation: unsure     Assessment:    Pregnancy @ 4842w3d weeks   Plan:     labs reviewed, problem list updated Consent signed. GBS sent TDAP offered  Rhogam given for RH negative Pediatrician: discussed. Infant feeding: plans to breastfeed. Maternity leave: discussed. Cigarette smoking: smokes ? PPD.  Orders Placed This Encounter  Procedures  . Strep Gp B NAA   Meds ordered this encounter  Medications  . ranitidine (ZANTAC) 150 MG tablet    Sig: Take 1 tablet (150 mg total) by mouth 2 (two) times daily.    Dispense:  60 tablet    Refill:  6  . docusate sodium (COLACE) 100 MG capsule    Sig: Take 1 capsule (100 mg total) by mouth 2 (two) times daily.    Dispense:  60 capsule    Refill:  5   Follow up in 1 Week.   150Patient ID:  Ernest HaberLamyre S Chronis, female   DOB: 07/29/1985, 31 y.o.   MRN: [id number]

## 2016-07-21 LAB — STREP GP B NAA: STREP GROUP B AG: NEGATIVE

## 2016-07-26 ENCOUNTER — Encounter: Payer: Medicaid Other | Admitting: Obstetrics

## 2016-08-02 ENCOUNTER — Encounter (HOSPITAL_COMMUNITY): Payer: Self-pay

## 2016-08-03 ENCOUNTER — Encounter: Payer: Medicaid Other | Admitting: Obstetrics

## 2016-08-10 ENCOUNTER — Encounter: Payer: Self-pay | Admitting: Obstetrics

## 2016-08-10 ENCOUNTER — Ambulatory Visit (INDEPENDENT_AMBULATORY_CARE_PROVIDER_SITE_OTHER): Payer: Medicaid Other | Admitting: Obstetrics

## 2016-08-10 DIAGNOSIS — O34219 Maternal care for unspecified type scar from previous cesarean delivery: Secondary | ICD-10-CM

## 2016-08-10 DIAGNOSIS — Z3480 Encounter for supervision of other normal pregnancy, unspecified trimester: Secondary | ICD-10-CM

## 2016-08-10 DIAGNOSIS — Z3483 Encounter for supervision of other normal pregnancy, third trimester: Secondary | ICD-10-CM

## 2016-08-10 NOTE — Progress Notes (Signed)
Patient is having back and hip pain. Patient is ready for her C-section on Saturday.

## 2016-08-10 NOTE — Progress Notes (Signed)
Subjective:  Ashley [name] is a 31 y.o. [id number] at 6550w4d being seen today for ongoing prenatal care.  She is currently monitored for the following issues for this low-risk pregnancy and has Obesity; Irregular periods/menstrual cycles; Symptomatic cholelithiasis; Previous cesarean section complicating pregnancy, antepartum condition or complication; Encounter for supervision of other normal pregnancy; Substance abuse complicating pregnancy, antepartum; and GBS bacteriuria on her problem list.  Patient reports no complaints.  Contractions: Not present. Vag. Bleeding: None.  Movement: Present. Denies leaking of fluid.   The following portions of the patient's history were reviewed and updated as appropriate: allergies, current medications, past family history, past medical history, past social history, past surgical history and problem list. Problem list updated.  Objective:   Vitals:   08/10/16 0907  BP: 112/73  Pulse: (!) 103  Weight: 280 lb 12.8 oz (127.4 kg)    Fetal Status: Fetal Heart Rate (bpm): 150   Movement: Present     General:  Alert, oriented and cooperative. Patient is in no acute distress.  Skin: Skin is warm and dry. No rash noted.   Cardiovascular: Normal heart rate noted  Respiratory: Normal respiratory effort, no problems with respiration noted  Abdomen: Soft, gravid, appropriate for gestational age. Pain/Pressure: Present     Pelvic:  Cervical exam deferred        Extremities: Normal range of motion.  Edema: None  Mental Status: Normal mood and affect. Normal behavior. Normal judgment and thought content.   Urinalysis: Urine Protein: Negative Urine Glucose: Negative  Assessment and Plan:  Pregnancy: [id number] at 7150w4d  There are no diagnoses linked to this encounter. Term labor symptoms and general obstetric precautions including but not limited to vaginal bleeding, contractions, leaking of fluid and fetal movement were reviewed in detail with the  patient. Please refer to After Visit Summary for other counseling recommendations.  Previous C/S.  Desires repeat C/ and BTL.  Repeat C/S / BTL scheduled for Saturday.08-13-16.   Brock Badharles A Kendre Jacinto, MDPatient ID: Ashley HaberLamyre S Fedrick, female   DOB: 11/03/1985, 31 y.o.   MRN: [id number]

## 2016-08-12 ENCOUNTER — Encounter (HOSPITAL_COMMUNITY)
Admission: RE | Admit: 2016-08-12 | Discharge: 2016-08-12 | Disposition: A | Discharge status: Home / Self Care | Payer: Medicaid Other | Source: Ambulatory Visit | Attending: Family Medicine | Admitting: Family Medicine

## 2016-08-12 LAB — CBC
HEMATOCRIT: 31.8 % — AB (ref 36.0–46.0)
HEMOGLOBIN: 10.8 g/dL — AB (ref 12.0–15.0)
MCH: 25.8 pg — AB (ref 26.0–34.0)
MCHC: 34 g/dL (ref 30.0–36.0)
MCV: 75.9 fL — ABNORMAL LOW (ref 78.0–100.0)
Platelets: 177 10*3/uL (ref 150–400)
RBC: 4.19 MIL/uL (ref 3.87–5.11)
RDW: 15.2 % (ref 11.5–15.5)
WBC: 7.7 10*3/uL (ref 4.0–10.5)

## 2016-08-12 NOTE — Patient Instructions (Signed)
20 Kjerstin S Mesmer  08/12/2016   Your procedure is scheduled on:  08/13/2016  [facility] 0930 AM.  Pick up the phone at the desk and dial [phone].   Call this number if you have problems the morning of surgery: [phone]   Remember:   Do not eat food:After Midnight.  Do not drink clear liquids: After Midnight.  Take these medicines the morning of surgery with A SIP OF WATER: none   Do not wear jewelry, make-up or nail polish.  Do not wear lotions, powders, or perfumes. Do not wear deodorant.  Do not shave 48 hours prior to surgery.  Do not bring valuables to the hospital.  [facility] is not   responsible for any belongings or valuables brought to the hospital.  Contacts, dentures or bridgework may not be worn into surgery.  Leave suitcase in the car. After surgery it may be brought to your room.  For patients admitted to the hospital, checkout time is 11:00 AM the day of              discharge.   Patients discharged the day of surgery will not be allowed to drive             home.  Name and phone number of your driver: na  Special Instructions:   N/A   Please read over the following fact sheets that you were given:   Surgical Site Infection Prevention

## 2016-08-13 ENCOUNTER — Encounter (HOSPITAL_COMMUNITY)
Admission: RE | Disposition: A | Discharge status: Home / Self Care | Payer: Self-pay | Source: Ambulatory Visit | Attending: Obstetrics & Gynecology

## 2016-08-13 ENCOUNTER — Inpatient Hospital Stay (HOSPITAL_COMMUNITY): Payer: Medicaid Other | Admitting: Anesthesiology

## 2016-08-13 ENCOUNTER — Inpatient Hospital Stay (HOSPITAL_COMMUNITY)
Admission: RE | Admit: 2016-08-13 | Discharge: 2016-08-15 | DRG: 765 | Disposition: A | Discharge status: Home / Self Care | Payer: Medicaid Other | Source: Ambulatory Visit | Attending: Obstetrics & Gynecology | Admitting: Obstetrics & Gynecology

## 2016-08-13 ENCOUNTER — Encounter (HOSPITAL_COMMUNITY): Payer: Self-pay | Admitting: *Deleted

## 2016-08-13 DIAGNOSIS — O9962 Diseases of the digestive system complicating childbirth: Secondary | ICD-10-CM | POA: Diagnosis present

## 2016-08-13 DIAGNOSIS — Z87891 Personal history of nicotine dependence: Secondary | ICD-10-CM

## 2016-08-13 DIAGNOSIS — Z3A39 39 weeks gestation of pregnancy: Secondary | ICD-10-CM

## 2016-08-13 DIAGNOSIS — O34211 Maternal care for low transverse scar from previous cesarean delivery: Principal | ICD-10-CM | POA: Diagnosis present

## 2016-08-13 DIAGNOSIS — K219 Gastro-esophageal reflux disease without esophagitis: Secondary | ICD-10-CM | POA: Diagnosis present

## 2016-08-13 DIAGNOSIS — Z98891 History of uterine scar from previous surgery: Secondary | ICD-10-CM

## 2016-08-13 DIAGNOSIS — O99214 Obesity complicating childbirth: Secondary | ICD-10-CM | POA: Diagnosis present

## 2016-08-13 DIAGNOSIS — Z8249 Family history of ischemic heart disease and other diseases of the circulatory system: Secondary | ICD-10-CM | POA: Diagnosis not present

## 2016-08-13 DIAGNOSIS — Z302 Encounter for sterilization: Secondary | ICD-10-CM

## 2016-08-13 DIAGNOSIS — Z6841 Body Mass Index (BMI) 40.0 and over, adult: Secondary | ICD-10-CM | POA: Diagnosis not present

## 2016-08-13 LAB — PREPARE RBC (CROSSMATCH)

## 2016-08-13 LAB — RPR: RPR: NONREACTIVE

## 2016-08-13 SURGERY — Surgical Case
Anesthesia: Spinal | Site: Abdomen | Laterality: Bilateral

## 2016-08-13 MED ORDER — OXYTOCIN 40 UNITS IN LACTATED RINGERS INFUSION - SIMPLE MED: 2.5000 [IU]/h | INTRAVENOUS | Status: AC

## 2016-08-13 MED ORDER — ONDANSETRON HCL 4 MG/2ML IJ SOLN
INTRAMUSCULAR | Status: DC | PRN
  Administered 2016-08-13: 4 mg via INTRAVENOUS

## 2016-08-13 MED ORDER — TETANUS-DIPHTH-ACELL PERTUSSIS 5-2.5-18.5 LF-MCG/0.5 IM SUSP: 0.5000 mL | Freq: Once | INTRAMUSCULAR | Status: DC

## 2016-08-13 MED ORDER — OXYTOCIN 10 UNIT/ML IJ SOLN
INTRAVENOUS | Status: DC | PRN
  Administered 2016-08-13: 40 [IU] via INTRAVENOUS

## 2016-08-13 MED ORDER — DOCUSATE SODIUM 100 MG PO CAPS
100.0000 mg | ORAL_CAPSULE | Freq: Two times a day (BID) | ORAL | Status: DC
  Administered 2016-08-13 – 2016-08-15 (×4): 100 mg via ORAL
  Filled 2016-08-13 (×4): qty 1

## 2016-08-13 MED ORDER — ONDANSETRON HCL 4 MG/2ML IJ SOLN: 4.0000 mg | Freq: Three times a day (TID) | INTRAMUSCULAR | Status: DC | PRN

## 2016-08-13 MED ORDER — OXYTOCIN 10 UNIT/ML IJ SOLN
INTRAMUSCULAR | Status: AC
  Filled 2016-08-13: qty 4

## 2016-08-13 MED ORDER — ZOLPIDEM TARTRATE 5 MG PO TABS: 5.0000 mg | ORAL_TABLET | Freq: Every evening | ORAL | Status: DC | PRN

## 2016-08-13 MED ORDER — SODIUM CHLORIDE 0.9 % IR SOLN
Status: DC | PRN
  Administered 2016-08-13: 1

## 2016-08-13 MED ORDER — OXYCODONE HCL 5 MG/5ML PO SOLN: 5.0000 mg | Freq: Once | ORAL | Status: DC | PRN

## 2016-08-13 MED ORDER — COCONUT OIL OIL: 1.0000 "application " | TOPICAL_OIL | Status: DC | PRN

## 2016-08-13 MED ORDER — BUPIVACAINE HCL (PF) 0.25 % IJ SOLN
INTRAMUSCULAR | Status: AC
  Filled 2016-08-13: qty 30

## 2016-08-13 MED ORDER — ACETAMINOPHEN 325 MG PO TABS
650.0000 mg | ORAL_TABLET | ORAL | Status: DC | PRN
  Administered 2016-08-13 – 2016-08-14 (×2): 650 mg via ORAL
  Filled 2016-08-13 (×2): qty 2

## 2016-08-13 MED ORDER — MORPHINE SULFATE (PF) 0.5 MG/ML IJ SOLN
INTRAMUSCULAR | Status: AC
  Filled 2016-08-13: qty 10

## 2016-08-13 MED ORDER — NALOXONE HCL 0.4 MG/ML IJ SOLN
0.4000 mg | INTRAMUSCULAR | Status: DC | PRN
Start: 2016-08-13 — End: 2016-08-15

## 2016-08-13 MED ORDER — SIMETHICONE 80 MG PO CHEW
80.0000 mg | CHEWABLE_TABLET | ORAL | Status: DC
  Administered 2016-08-14: 80 mg via ORAL
  Filled 2016-08-13: qty 1

## 2016-08-13 MED ORDER — FENTANYL CITRATE (PF) 100 MCG/2ML IJ SOLN
25.0000 ug | INTRAMUSCULAR | Status: DC | PRN
  Administered 2016-08-13 (×2): 25 ug via INTRAVENOUS
  Administered 2016-08-13: 50 ug via INTRAVENOUS

## 2016-08-13 MED ORDER — KETOROLAC TROMETHAMINE 30 MG/ML IJ SOLN
30.0000 mg | Freq: Four times a day (QID) | INTRAMUSCULAR | Status: AC | PRN
Start: 2016-08-13 — End: 2016-08-14

## 2016-08-13 MED ORDER — SIMETHICONE 80 MG PO CHEW
80.0000 mg | CHEWABLE_TABLET | ORAL | Status: DC | PRN
  Administered 2016-08-14: 80 mg via ORAL
  Filled 2016-08-13: qty 1

## 2016-08-13 MED ORDER — FAMOTIDINE 20 MG PO TABS
20.0000 mg | ORAL_TABLET | Freq: Two times a day (BID) | ORAL | Status: DC
  Administered 2016-08-13 – 2016-08-15 (×4): 20 mg via ORAL
  Filled 2016-08-13 (×4): qty 1

## 2016-08-13 MED ORDER — METOCLOPRAMIDE HCL 5 MG/ML IJ SOLN
INTRAMUSCULAR | Status: DC | PRN
  Administered 2016-08-13: 10 mg via INTRAVENOUS

## 2016-08-13 MED ORDER — METOCLOPRAMIDE HCL 5 MG/ML IJ SOLN
INTRAMUSCULAR | Status: AC
  Filled 2016-08-13: qty 2

## 2016-08-13 MED ORDER — DEXTROSE 5 % IV SOLN
2.0000 g | INTRAVENOUS | Status: AC
  Administered 2016-08-13: 2 g via INTRAVENOUS
  Filled 2016-08-13: qty 2

## 2016-08-13 MED ORDER — BUPIVACAINE IN DEXTROSE 0.75-8.25 % IT SOLN
INTRATHECAL | Status: DC | PRN
  Administered 2016-08-13: 1.6 mL via INTRATHECAL

## 2016-08-13 MED ORDER — PRENATAL MULTIVITAMIN CH
1.0000 | ORAL_TABLET | Freq: Every day | ORAL | Status: DC
  Administered 2016-08-14: 1 via ORAL
  Filled 2016-08-13: qty 1

## 2016-08-13 MED ORDER — ONDANSETRON HCL 4 MG/2ML IJ SOLN: 4.0000 mg | Freq: Four times a day (QID) | INTRAMUSCULAR | Status: DC | PRN

## 2016-08-13 MED ORDER — LIDOCAINE-EPINEPHRINE 2 %-1:100000 IJ SOLN: INTRAMUSCULAR | Status: DC | PRN

## 2016-08-13 MED ORDER — ONDANSETRON HCL 4 MG/2ML IJ SOLN
INTRAMUSCULAR | Status: AC
  Filled 2016-08-13: qty 2

## 2016-08-13 MED ORDER — FENTANYL CITRATE (PF) 100 MCG/2ML IJ SOLN
INTRAMUSCULAR | Status: AC
  Filled 2016-08-13: qty 2

## 2016-08-13 MED ORDER — DIPHENHYDRAMINE HCL 25 MG PO CAPS
25.0000 mg | ORAL_CAPSULE | ORAL | Status: DC | PRN
Start: 2016-08-13 — End: 2016-08-15
  Administered 2016-08-14: 25 mg via ORAL
  Filled 2016-08-13 (×2): qty 1

## 2016-08-13 MED ORDER — WITCH HAZEL-GLYCERIN EX PADS: 1.0000 "application " | MEDICATED_PAD | CUTANEOUS | Status: DC | PRN

## 2016-08-13 MED ORDER — SCOPOLAMINE 1 MG/3DAYS TD PT72
1.0000 | MEDICATED_PATCH | Freq: Once | TRANSDERMAL | Status: DC
  Administered 2016-08-13: 1.5 mg via TRANSDERMAL

## 2016-08-13 MED ORDER — DIPHENHYDRAMINE HCL 50 MG/ML IJ SOLN: 12.5000 mg | INTRAMUSCULAR | Status: DC | PRN

## 2016-08-13 MED ORDER — NALOXONE HCL 2 MG/2ML IJ SOSY
1.0000 ug/kg/h | PREFILLED_SYRINGE | INTRAVENOUS | Status: DC | PRN
  Filled 2016-08-13: qty 2

## 2016-08-13 MED ORDER — DIBUCAINE 1 % RE OINT: 1.0000 "application " | TOPICAL_OINTMENT | RECTAL | Status: DC | PRN

## 2016-08-13 MED ORDER — OXYCODONE HCL 5 MG PO TABS: 5.0000 mg | ORAL_TABLET | Freq: Once | ORAL | Status: DC | PRN

## 2016-08-13 MED ORDER — MENTHOL 3 MG MT LOZG: 1.0000 | LOZENGE | OROMUCOSAL | Status: DC | PRN

## 2016-08-13 MED ORDER — SENNOSIDES-DOCUSATE SODIUM 8.6-50 MG PO TABS
2.0000 | ORAL_TABLET | ORAL | Status: DC
  Administered 2016-08-14: 2 via ORAL
  Filled 2016-08-13: qty 2

## 2016-08-13 MED ORDER — LACTATED RINGERS IV SOLN
INTRAVENOUS | Status: DC
  Administered 2016-08-14: 06:00:00 via INTRAVENOUS

## 2016-08-13 MED ORDER — MEPERIDINE HCL 25 MG/ML IJ SOLN: 6.2500 mg | INTRAMUSCULAR | Status: DC | PRN

## 2016-08-13 MED ORDER — LACTATED RINGERS IV SOLN
125.0000 mL/h | INTRAVENOUS | Status: DC
  Administered 2016-08-13: 11:00:00 via INTRAVENOUS
  Administered 2016-08-13: 125 mL/h via INTRAVENOUS

## 2016-08-13 MED ORDER — DIPHENHYDRAMINE HCL 25 MG PO CAPS: 25.0000 mg | ORAL_CAPSULE | Freq: Four times a day (QID) | ORAL | Status: DC | PRN

## 2016-08-13 MED ORDER — MEASLES, MUMPS & RUBELLA VAC ~~LOC~~ INJ: 0.5000 mL | INJECTION | Freq: Once | SUBCUTANEOUS | Status: DC

## 2016-08-13 MED ORDER — IBUPROFEN 600 MG PO TABS
600.0000 mg | ORAL_TABLET | Freq: Four times a day (QID) | ORAL | Status: DC
  Administered 2016-08-13 – 2016-08-15 (×7): 600 mg via ORAL
  Filled 2016-08-13 (×7): qty 1

## 2016-08-13 MED ORDER — PHENYLEPHRINE 8 MG IN D5W 100 ML (0.08MG/ML) PREMIX OPTIME
INJECTION | INTRAVENOUS | Status: DC | PRN
  Administered 2016-08-13: 60 ug/min via INTRAVENOUS

## 2016-08-13 MED ORDER — KETOROLAC TROMETHAMINE 30 MG/ML IJ SOLN
INTRAMUSCULAR | Status: AC
  Administered 2016-08-13: 30 mg via INTRAVENOUS
  Filled 2016-08-13: qty 1

## 2016-08-13 MED ORDER — LACTATED RINGERS IV SOLN
INTRAVENOUS | Status: DC | PRN
Start: 2016-08-13 — End: 2016-08-13
  Administered 2016-08-13: 12:00:00 via INTRAVENOUS

## 2016-08-13 MED ORDER — FENTANYL CITRATE (PF) 100 MCG/2ML IJ SOLN
INTRAMUSCULAR | Status: DC | PRN
  Administered 2016-08-13: 10 ug via INTRATHECAL

## 2016-08-13 MED ORDER — NALBUPHINE HCL 10 MG/ML IJ SOLN
5.0000 mg | INTRAMUSCULAR | Status: DC | PRN
  Filled 2016-08-13: qty 1

## 2016-08-13 MED ORDER — SODIUM CHLORIDE 0.9% FLUSH: 3.0000 mL | INTRAVENOUS | Status: DC | PRN

## 2016-08-13 MED ORDER — KETOROLAC TROMETHAMINE 30 MG/ML IJ SOLN
30.0000 mg | Freq: Four times a day (QID) | INTRAMUSCULAR | Status: AC | PRN
  Administered 2016-08-13: 30 mg via INTRAVENOUS

## 2016-08-13 MED ORDER — NALBUPHINE HCL 10 MG/ML IJ SOLN: 5.0000 mg | INTRAMUSCULAR | Status: DC | PRN

## 2016-08-13 MED ORDER — NALBUPHINE HCL 10 MG/ML IJ SOLN
5.0000 mg | Freq: Once | INTRAMUSCULAR | Status: AC | PRN
  Administered 2016-08-13: 5 mg via SUBCUTANEOUS

## 2016-08-13 MED ORDER — PHENYLEPHRINE 8 MG IN D5W 100 ML (0.08MG/ML) PREMIX OPTIME
INJECTION | INTRAVENOUS | Status: AC
  Filled 2016-08-13: qty 100

## 2016-08-13 MED ORDER — SOD CITRATE-CITRIC ACID 500-334 MG/5ML PO SOLN
30.0000 mL | ORAL | Status: AC
  Administered 2016-08-13: 30 mL via ORAL
  Filled 2016-08-13: qty 15

## 2016-08-13 MED ORDER — NALBUPHINE HCL 10 MG/ML IJ SOLN: 5.0000 mg | Freq: Once | INTRAMUSCULAR | Status: AC | PRN

## 2016-08-13 MED ORDER — MORPHINE SULFATE (PF) 0.5 MG/ML IJ SOLN
INTRAMUSCULAR | Status: DC | PRN
  Administered 2016-08-13: .2 mg via INTRATHECAL

## 2016-08-13 SURGICAL SUPPLY — 34 items
APL SKNCLS STERI-STRIP NONHPOA (GAUZE/BANDAGES/DRESSINGS) ×1
BENZOIN TINCTURE PRP APPL 2/3 (GAUZE/BANDAGES/DRESSINGS) ×2 IMPLANT
CHLORAPREP W/TINT 26ML (MISCELLANEOUS) ×3 IMPLANT
CLAMP CORD UMBIL (MISCELLANEOUS) IMPLANT
CLIP FILSHIE TUBAL LIGA STRL (Clip) ×2 IMPLANT
CLOSURE STERI STRIP 1/2 X4 (GAUZE/BANDAGES/DRESSINGS) ×2 IMPLANT
CLOTH BEACON ORANGE TIMEOUT ST (SAFETY) ×3 IMPLANT
DRESSING DISP NPWT PICO 4X12 (MISCELLANEOUS) ×2 IMPLANT
DRSG OPSITE POSTOP 4X10 (GAUZE/BANDAGES/DRESSINGS) ×3 IMPLANT
ELECT REM PT RETURN 9FT ADLT (ELECTROSURGICAL) ×3
ELECTRODE REM PT RTRN 9FT ADLT (ELECTROSURGICAL) ×1 IMPLANT
EXTRACTOR VACUUM M CUP 4 TUBE (SUCTIONS) IMPLANT
EXTRACTOR VACUUM M CUP 4' TUBE (SUCTIONS)
GLOVE BIOGEL PI IND STRL 7.0 (GLOVE) ×3 IMPLANT
GLOVE BIOGEL PI INDICATOR 7.0 (GLOVE) ×6
GLOVE ECLIPSE 7.0 STRL STRAW (GLOVE) ×3 IMPLANT
GOWN STRL REUS W/TWL LRG LVL3 (GOWN DISPOSABLE) ×9 IMPLANT
KIT ABG SYR 3ML LUER SLIP (SYRINGE) IMPLANT
NDL HYPO 25X5/8 SAFETYGLIDE (NEEDLE) IMPLANT
NEEDLE HYPO 22GX1.5 SAFETY (NEEDLE) ×5 IMPLANT
NEEDLE HYPO 25X5/8 SAFETYGLIDE (NEEDLE) IMPLANT
NS IRRIG 1000ML POUR BTL (IV SOLUTION) ×3 IMPLANT
PACK C SECTION WH (CUSTOM PROCEDURE TRAY) ×3 IMPLANT
PAD OB MATERNITY 4.3X12.25 (PERSONAL CARE ITEMS) ×3 IMPLANT
PENCIL SMOKE EVAC W/HOLSTER (ELECTROSURGICAL) ×3 IMPLANT
RTRCTR C-SECT PINK 25CM LRG (MISCELLANEOUS) IMPLANT
SUT PLAIN 2 0 XLH (SUTURE) ×2 IMPLANT
SUT VIC AB 0 CTX 36 (SUTURE) ×9
SUT VIC AB 0 CTX36XBRD ANBCTRL (SUTURE) ×3 IMPLANT
SUT VIC AB 4-0 KS 27 (SUTURE) ×3 IMPLANT
SYR 30ML LL (SYRINGE) ×3 IMPLANT
SYR CONTROL 10ML LL (SYRINGE) ×2 IMPLANT
TOWEL OR 17X24 6PK STRL BLUE (TOWEL DISPOSABLE) ×3 IMPLANT
TRAY FOLEY CATH SILVER 14FR (SET/KITS/TRAYS/PACK) ×3 IMPLANT

## 2016-08-13 NOTE — Anesthesia Procedure Notes (Signed)
Spinal  Patient location during procedure: OR Start time: 08/13/2016 10:58 AM End time: 08/13/2016 11:03 AM Staffing Anesthesiologist: Chaney MallingHODIERNE, Adaiah Jaskot Performed: anesthesiologist  Preanesthetic Checklist Completed: patient identified, surgical consent, pre-op evaluation, timeout performed, IV checked, risks and benefits discussed and monitors and equipment checked Spinal Block Patient position: sitting Prep: DuraPrep Patient monitoring: cardiac monitor, continuous pulse ox and blood pressure Approach: midline Location: L3-4 Injection technique: single-shot Needle Needle type: Pencan  Needle gauge: 24 G Needle length: 9 cm Assessment Sensory level: T10 Additional Notes Functioning IV was confirmed and monitors were applied. Sterile prep and drape, including hand hygiene and sterile gloves were used. The patient was positioned and the spine was prepped. The skin was anesthetized with lidocaine.  Free flow of clear CSF was obtained prior to injecting local anesthetic into the CSF.  The spinal needle aspirated freely following injection.  The needle was carefully withdrawn.  The patient tolerated the procedure well.

## 2016-08-13 NOTE — Progress Notes (Signed)
Patient ready for discharge from PACU at 1350. On hold for room 135 while it is STAT cleaned. Patient removed from Cardio/EKG as well as BP. Patient resting with eyes closed but easily arousable. Pain level after total 100 mcg IV Fentanyl is 5-6. Awaiting call from floor with approval for transfer. Baby wrapped in blankets and currently being held by mother.  RN continuing to provide 1-on-1 care while on Hold.  Solaris Kram WarringtonBrown Analena Gama, [place]RN 08/13/2016 2:02 PM

## 2016-08-13 NOTE — Anesthesia Postprocedure Evaluation (Signed)
Anesthesia Post Note  Patient: Ashley [name]  Procedure(s) Performed: Procedure(s) (LRB): CESAREAN SECTION WITH BILATERAL TUBAL LIGATION (Bilateral)  Patient location during evaluation: PACU Anesthesia Type: Spinal Level of consciousness: oriented and awake and alert Pain management: pain level controlled Vital Signs Assessment: post-procedure vital signs reviewed and stable Respiratory status: spontaneous breathing, respiratory function stable and patient connected to nasal cannula oxygen Cardiovascular status: blood pressure returned to baseline and stable Postop Assessment: no headache and no backache Anesthetic complications: no        Last Vitals:  Vitals:   08/13/16 1300 08/13/16 1315  BP: 117/73 117/77  Pulse: 93 86  Resp: (!) 21 15  Temp:      Last Pain:  Vitals:   08/13/16 1315  TempSrc:   PainSc: 7    Pain Goal:                 Llewellyn Schoenberger S

## 2016-08-13 NOTE — H&P (Signed)
LABOR AND DELIVERY ADMISSION HISTORY AND PHYSICAL NOTE  Ashley [name] is a 31 y.o. female [phone] with IUP at 3969w0d by LMP with history of 2 cesarean sections presenting for elective RLTCS and BTL.  She denies vaginal bleeding, leakage of fluid, contractions, headache, vision changes, RUQ abdominal pain, or edema.  She reports positive fetal movement.  Prenatal History/Complications: Current pregnancy complicated by GBS bacteruria early in pregnancy.  No complications with previous pregnancies.  Pt does not recall why she received c-sections for her other 2 pregnancies.  Past Medical History: Past Medical History:  Diagnosis Date  . GERD (gastroesophageal reflux disease) 2012   diet controlled - no meds  . Headache(784.0)    3X WEEK  . 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 Irregular periods/menstrual cycles 01/30/2012  . Late prenatal care 01/30/2012  . Poor social situation 01/30/2012    Past Surgical History: Past Surgical History:  Procedure Laterality Date  . CESAREAN SECTION  2009  . CESAREAN SECTION  01/30/2012   Procedure: CESAREAN SECTION;  Surgeon: Michael LitterNaima A Dillard, MD;  Location: [facility];  Service: Gynecology;  Laterality: N/A;  Repeat C/S  . CHOLECYSTECTOMY N/A 10/16/2013   Procedure: LAPAROSCOPIC CHOLECYSTECTOMY WITH INTRAOPERATIVE CHOLANGIOGRAM;  Surgeon: Wilmon ArmsMatthew K. Corliss Skainssuei, MD;  Location: [facility] OR;  Service: General;  Laterality: N/A;  . DILATION AND EVACUATION N/A 07/28/2015   Procedure: SUCTION, DILATATION AND EVACUATION;  Surgeon: Brock Badharles A Harper, MD;  Location: [facility];  Service: Gynecology;  Laterality: N/A;  . TONSILLECTOMY  AGE 33  . TONSILLECTOMY AND ADENOIDECTOMY  AGEB 18    Obstetrical History: OB History    Gravida Para Term Preterm AB Living   4 2 2   1 2    SAB TAB Ectopic Multiple Live Births   1       2      Obstetric Comments   NO PRENATAL CARE WITH FIRST PREGNANCY. 36-38 WKS  GEATATION.  "DID NOT KNOW SHE WAS PREGNANT UNTIL DELIVERY."      Social  History: Social History   Social History  . Marital status: Single    Spouse name: N/A  . Number of children: 1  . Years of education: 7112   Social History Main Topics  . Smoking status: Former Smoker    Packs/day: 0.00    Years: 6.00    Types: Cigarettes    Quit date: 11/01/2015  . Smokeless tobacco: Never Used  . Alcohol use No  . Drug use: No  . Sexual activity: Yes    Partners: Male    Birth control/ protection: None   Other Topics Concern  . None   Social History Narrative  . None    Family History: Family History  Problem Relation Age of Onset  . Asthma Mother   . Hypertension Mother   . Early death Mother     AGE 33  . Cancer Maternal Grandmother     Allergies: Allergies  Allergen Reactions  . Fruit Extracts Itching    Fresh Fruit     Prescriptions Prior to Admission  Medication Sig Dispense Refill Last Dose  . acetaminophen (TYLENOL) 325 MG tablet Take 650 mg by mouth 2 (two) times daily as needed (for pain/headache.).   Taking  . docusate sodium (COLACE) 100 MG capsule Take 1 capsule (100 mg total) by mouth 2 (two) times daily. (Patient not taking: Reported on 08/08/2016) 60 capsule 5 Not Taking  . ranitidine (ZANTAC) 150 MG tablet Take 1 tablet (150 mg total)  by mouth 2 (two) times daily. (Patient not taking: Reported on 08/08/2016) 60 tablet 6 Not Taking at Unknown time     Review of Systems   All systems reviewed and negative except as stated in HPI  Height 5\' 3"  (1.6 m), weight 127 kg (280 lb), last menstrual period 11/14/2015, unknown if currently breastfeeding. General appearance: alert, cooperative, appears stated age and no distress Lungs: clear to auscultation bilaterally Heart: regular rate and rhythm Abdomen: soft, non-tender; bowel sounds normal Extremities: No calf swelling or tenderness Presentation: cephalic   Prenatal labs: ABO, Rh: --/--/B POS (02/09 1120) Antibody: NEG (02/09 1120) Rubella: Immune RPR: Non Reactive (02/09  1124)  HBsAg: Negative (07/26 1545)  HIV: Non Reactive (11/30 1105)  GBS: Negative (01/16 1126)  1 hr glucola: 81 (06/02/16) Genetic screening:  declined Anatomy US: nrl, limited 2/2 BMI  Prenatal Transfer Tool  Maternal Diabetes: No Genetic Screening: Declined Maternal Ultrasounds/Referrals: Normal Fetal Ultrasounds or other Referrals:  None Maternal Substance Abuse:  No Significant Maternal Medications:  None Significant Maternal Lab Results: None  Results for orders placed or performed during the hospital encounter of 08/12/16 (from the past 24 hour(s))  Type and screen   Collection Time: 08/12/16 11:20 AM  Result Value Ref Range   ABO/RH(D) B POS    Antibody Screen NEG    Sample Expiration 08/15/2016   CBC   Collection Time: 08/12/16 11:24 AM  Result Value Ref Range   WBC 7.7 4.0 - 10.5 K/uL   RBC 4.19 3.87 - 5.11 MIL/uL   Hemoglobin 10.8 (L) 12.0 - 15.0 g/dL   HCT 16.1 (L) 09.6 - 04.5 %   MCV 75.9 (L) 78.0 - 100.0 fL   MCH 25.8 (L) 26.0 - 34.0 pg   MCHC 34.0 30.0 - 36.0 g/dL   RDW 40.9 81.1 - 91.4 %   Platelets 177 150 - 400 K/uL  RPR   Collection Time: 08/12/16 11:24 AM  Result Value Ref Range   RPR Ser Ql Non Reactive Non Reactive    Patient Active Problem List   Diagnosis Date Noted  . GBS bacteriuria 03/24/2016  . Substance abuse complicating pregnancy, antepartum 02/04/2016  . Previous cesarean section complicating pregnancy, antepartum condition or complication 01/27/2016  . Encounter for supervision of other normal pregnancy 01/27/2016  . Irregular periods/menstrual cycles 01/30/2012  . Obesity 11/13/2011    Assessment: Ashley [name] is a 31 y.o. [id number] at [redacted]w[redacted]d here for elective RLTCS and BTL.  #Labor: RLTCS #Pain: Spinal with duramore #ID:  GBS bacteruria #MOF: Both #MOC: BTL #Circ:  Outpatient  Satira Anis, Medical Student 08/13/2016, 10:23 AM   Pt seen and examined.  Pt presenting for rpt c/s and btl  The risks of cesarean  section discussed with the patient included but were not limited to: bleeding which may require transfusion or reoperation; infection which may require antibiotics; injury to bowel, bladder, ureters or other surrounding organs; injury to the fetus; need for additional procedures including hysterectomy in the event of a life-threatening hemorrhage; placental abnormalities wth subsequent pregnancies, incisional problems, thromboembolic phenomenon and other postoperative/anesthesia complications. The patient concurred with the proposed plan, giving informed written consent for the procedure.   Patient desires permanent sterilization.  Other reversible forms of contraception were discussed with patient; she declines all other modalities. Risks of procedure discussed with patient including but not limited to: risk of regret, permanence of method, bleeding, infection, injury to surrounding organs and need for additional procedures.  Failure risk of  0.5-1% with increased risk of ectopic gestation if pregnancy occurs was also discussed with patient.    Patient verbalized understanding of these risks and wants to proceed with sterilization.  Written informed consent obtained.  Pt ROS is negative as above.  Height 5\' 3"  (1.6 m), weight 127 kg (280 lb), last menstrual period 11/14/2015, unknown if currently breastfeeding. General appearance: alert, cooperative, appears stated age and no distress Lungs: clear to auscultation bilaterally Heart: regular rate and rhythm Abdomen: soft, non-tender; bowel sounds normal Extremities: No calf swelling or tenderness Presentation: cephalic   Pt for rpt c/s and btl Lage pannus needs trexi and pico wound vac.   Pre op abx ordered.  Elsie Lincoln 10:41 AM

## 2016-08-13 NOTE — Brief Op Note (Signed)
08/13/2016  12:01 PM  PATIENT:  Ashley [name]  31 y.o. female  PRE-OPERATIVE DIAGNOSIS:  REPEAT c section and undesired fertility  POST-OPERATIVE DIAGNOSIS:  REPEAT c section and undesired fertility  PROCEDURE:  Procedure(s): CESAREAN SECTION WITH BILATERAL TUBAL LIGATION (Bilateral)  SURGEON:  Surgeon(s) and Role:    * Lesly DukesKelly H Phillip Sandler, MD - Primary    * Michaele OfferElizabeth Woodland Mumaw, DO - Fellow  PHYSICIAN ASSISTANT:   ASSISTANTS: Richard, medical student [employer]   ANESTHESIA:   spinal  EBL:  Total I/O In: 1000 [I.V.:1000] Out: 800 [Urine:50; Blood:750]  BLOOD ADMINISTERED:none  DRAINS: none   LOCAL MEDICATIONS USED:  MARCAINE     SPECIMEN:  No Specimen  DISPOSITION OF SPECIMEN:  N/A  COUNTS:  YES  TOURNIQUET:  * No tourniquets in log *  DICTATION: .Note written in EPIC  PLAN OF CARE: Admit to inpatient   PATIENT DISPOSITION:  PACU - hemodynamically stable.   Delay start of Pharmacological VTE agent (>24hrs) due to surgical blood loss or risk of bleeding: not applicable

## 2016-08-13 NOTE — Transfer of Care (Signed)
Immediate Anesthesia Transfer of Care Note  Patient: Ashley [name]  Procedure(s) Performed: Procedure(s): CESAREAN SECTION WITH BILATERAL TUBAL LIGATION (Bilateral)  Patient Location: PACU  Anesthesia Type:Spinal  Level of Consciousness: awake and alert   Airway & Oxygen Therapy: Patient Spontanous Breathing  Post-op Assessment: Report given to RN and Post -op Vital signs reviewed and stable  Post vital signs: Reviewed  Last Vitals:  Vitals:   08/13/16 1026  BP: 113/77  Pulse: 97  Resp: 18  Temp: 36.7 C    Last Pain:  Vitals:   08/13/16 1026  TempSrc: Oral         Complications: No apparent anesthesia complications

## 2016-08-13 NOTE — Anesthesia Preprocedure Evaluation (Signed)
Anesthesia Evaluation  Patient identified by MRN, date of birth, ID band Patient awake    Reviewed: Allergy & Precautions, H&P , NPO status , Patient's Chart, lab work & pertinent test results  Airway Mallampati: II   Neck ROM: full    Dental   Pulmonary former smoker,    breath sounds clear to auscultation       Cardiovascular negative cardio ROS   Rhythm:regular Rate:Normal     Neuro/Psych  Headaches,    GI/Hepatic GERD  ,  Endo/Other  Morbid obesity  Renal/GU      Musculoskeletal   Abdominal   Peds  Hematology   Anesthesia Other Findings   Reproductive/Obstetrics (+) Pregnancy                             Anesthesia Physical Anesthesia Plan  ASA: II  Anesthesia Plan: Spinal   Post-op Pain Management:    Induction: Intravenous  Airway Management Planned: Simple Face Mask  Additional Equipment:   Intra-op Plan:   Post-operative Plan:   Informed Consent: I have reviewed the patients History and Physical, chart, labs and discussed the procedure including the risks, benefits and alternatives for the proposed anesthesia with the patient or authorized representative who has indicated his/her understanding and acceptance.     Plan Discussed with: CRNA, Anesthesiologist and Surgeon  Anesthesia Plan Comments:         Anesthesia Quick Evaluation

## 2016-08-13 NOTE — Op Note (Signed)
Ashley [name] PROCEDURE DATE: 08/13/2016  PREOPERATIVE DIAGNOSIS: Intrauterine pregnancy at  683w0d weeks gestation with history of 2 prior c sections and undesired fertility  POSTOPERATIVE DIAGNOSIS: The same  PROCEDURE:    Low Transverse Cesarean Section and Bilateral tubal ligation with Filshie clips  SURGEON:  Dr. Elsie LincolnKelly Johniya Durfee  ASSISTANT: Dr. Gailen ShelterBeth Mumaw  INDICATIONS: Ashley [name] is a 31 y.o. [id number] at 623w0d for repeat cesarean section and BTL.  The risks of cesarean section discussed with the patient included but were not limited to: bleeding which may require transfusion or reoperation; infection which may require antibiotics; injury to bowel, bladder, ureters or other surrounding organs; injury to the fetus; need for additional procedures including hysterectomy in the event of a life-threatening hemorrhage; placental abnormalities wth subsequent pregnancies, incisional problems, thromboembolic phenomenon and other postoperative/anesthesia complications. The patient concurred with the proposed plan, giving informed written consent for the procedure.    FINDINGS:  Viable female  infant in cephalic presentation, 9,9 Apgars, weight to be determined in 1 hour, copious clear amniotic fluid.  Intact placenta, three vessel cord.  Grossly normal uterus, ovaries and fallopian tubes. .   ANESTHESIA:    Spinal  ESTIMATED BLOOD LOSS: 750cc  SPECIMENS: Placenta sent to L & D  COMPLICATIONS: None immediate  PROCEDURE IN DETAIL:  The patient received intravenous antibiotics and had sequential compression devices applied to her lower extremities.  Spinal anesthesia was administered and was found to be adequate. She was then placed in a dorsal supine position with a leftward tilt, and prepped and draped in a sterile manner.  A foley catheter was placed into her bladder and attached to constant gravity.  After an adequate timeout was performed, a Pfannenstiel skin incision was made with  scalpel and carried through to the underlying layer of fascia. The fascia was incised in the midline and this incision was extended bilaterally using the Mayo scissors. Kocher clamps were applied to the superior aspect of the fascial incision and the underlying rectus muscles were dissected off bluntly. A similar process was carried out on the inferior aspect of the facial incision. The rectus muscles were separated in the midline bluntly and the peritoneum was entered bluntly.   A transverse hysterotomy was made with a scalpel and extended bilaterally bluntly. The bladder blade was then removed. The infant was successfully delivered, and cord was clamped and cut and infant was handed over to awaiting neonatology team. Uterine massage was then administered and the placenta delivered intact with three-vessel cord. The uterus was cleared of clot and debris.  The hysterotomy was closed with 0 vicryl.  A second imbricating suture of 0-Vicryl was used to reinforce the incision and aid in hemostasis.  The peritoneum and rectus muscles were noted to be hemostatic.  The peritoneum was closed with 0-Vicryl.  The fascia was closed with 0-Vicryl in a running fashion with good restoration of anatomy.  The subcutaneus tissue was copiously irrigated and re-approximated with 0-plain.  The skin was closed with 4-0 Vicryl in a subcuticular fashion.  A prophylactic Pico wound vacuum was placed.    Pt tolerated the procedure will.  All counts were correct x2.  Pt went to the recovery room in stable condition.

## 2016-08-14 ENCOUNTER — Encounter (HOSPITAL_COMMUNITY): Payer: Self-pay | Admitting: Obstetrics & Gynecology

## 2016-08-14 DIAGNOSIS — Z98891 History of uterine scar from previous surgery: Secondary | ICD-10-CM

## 2016-08-14 DIAGNOSIS — Z302 Encounter for sterilization: Secondary | ICD-10-CM

## 2016-08-14 HISTORY — DX: History of uterine scar from previous surgery: Z98.891

## 2016-08-14 LAB — CBC
HCT: 29.6 % — ABNORMAL LOW (ref 36.0–46.0)
Hemoglobin: 9.9 g/dL — ABNORMAL LOW (ref 12.0–15.0)
MCH: 25.4 pg — AB (ref 26.0–34.0)
MCHC: 33.4 g/dL (ref 30.0–36.0)
MCV: 75.9 fL — ABNORMAL LOW (ref 78.0–100.0)
PLATELETS: 160 10*3/uL (ref 150–400)
RBC: 3.9 MIL/uL (ref 3.87–5.11)
RDW: 15.2 % (ref 11.5–15.5)
WBC: 8.7 10*3/uL (ref 4.0–10.5)

## 2016-08-14 LAB — BIRTH TISSUE RECOVERY COLLECTION (PLACENTA DONATION)

## 2016-08-14 MED ORDER — IBUPROFEN 600 MG PO TABS: 600.0000 mg | ORAL_TABLET | Freq: Four times a day (QID) | ORAL | 0 refills | Status: DC

## 2016-08-14 MED ORDER — DOCUSATE SODIUM 100 MG PO CAPS: 100.0000 mg | ORAL_CAPSULE | Freq: Two times a day (BID) | ORAL | 0 refills | Status: DC

## 2016-08-14 MED ORDER — OXYCODONE-ACETAMINOPHEN 5-325 MG PO TABS: 1.0000 | ORAL_TABLET | ORAL | 0 refills | Status: DC | PRN

## 2016-08-14 MED ORDER — OXYCODONE-ACETAMINOPHEN 5-325 MG PO TABS
1.0000 | ORAL_TABLET | ORAL | Status: DC | PRN
  Administered 2016-08-14 – 2016-08-15 (×3): 2 via ORAL
  Filled 2016-08-14 (×3): qty 2

## 2016-08-14 NOTE — Clinical Social Work Maternal (Signed)
  CLINICAL SOCIAL WORK MATERNAL/CHILD NOTE  Patient Details  Name: Ashley [name] MRN: [id number] Date of Birth: Jul 14, 1985  Date:  08/14/2016  Clinical Social Worker Initiating Note:  Ferdinand Lango Montasia Chisenhall, MSW, LCSW-A  Date/ Time Initiated:  08/14/16/1121     Child's Name:  Ashley [name]   Legal Guardian:  Other (Comment) (Nt established by court sysem; MOB and FOB parnet collectively )   Need for Interpreter:  None   Date of Referral:  08/13/16     Reason for Referral:  Current Substance Use/Substance Use During Pregnancy    Referral Source:  RN   Address:  [place] [address]  Phone number:  [phone]   Household Members:  Self, Minor Children, Significant Other   Natural Supports (not living in the home):      Professional Supports: None   Employment:     Type of Work:     Education:      Pensions consultant:  [employer]   Other Resources:  [employer], Physicist, medical    Cultural/Religious Considerations Which May Impact Care:  [employer]   Strengths:  Ability to meet basic needs , Compliance with medical plan , Home prepared for child    Risk Factors/Current Problems:  Substance Use    Cognitive State:  Alert , Able to Concentrate , Insightful , Goal Oriented    Mood/Affect:  Calm , Comfortable , Interested    CSW Assessment: CSW met with MOB at bedside to complete assessment for consult regarding THC use. Upon this writers arrival, MOB was sitting in bed while baby was asleep in basinet. This Probation officer explained role and reasoning for visit. MOB was engaging and fourth coming regarding substance use. MOB notes she has not used recently so has no concern for baby's UDS/CDS results. This Probation officer informed MOB of the hospitals policy and procedure regarding drug use and mandatory reporting. MOB verbalized understanding. At this time, no other needs were addressed or requested. Case closed to this CSW.   CSW Plan/Description:  Patient/Family  Education , No Further Intervention Required/No Barriers to Discharge, Other (Comment) (CSW will continue to follow pending UDS and CDS results )    Ferdinand Lango Daylah Sayavong, MSW, [facility]  Office: [phone]

## 2016-08-14 NOTE — Progress Notes (Signed)
Patient ID: Ernest HaberLamyre S Wilhide, female   DOB: 09/16/1985, 31 y.o.   MRN: [id number]  POSTPARTUM PROGRESS NOTE  Post Op/Partum Day #1 Subjective:  Ernest HaberLamyre S Qualls is a 31 y.o. [id number] 5260w0d s/p RLTCS with BTL.  No acute events overnight.  Pt denies problems with ambulating, voiding or po intake.  She denies nausea or vomiting.  Pain is well controlled.  She has had flatus. She has not had bowel movement.  Lochia Small.   Objective: Blood pressure (!) 114/53, pulse 76, temperature 97.7 F (36.5 C), resp. rate 18, height 5\' 3"  (1.6 m), weight 280 lb (127 kg), last menstrual period 11/14/2015, SpO2 98 %, unknown if currently breastfeeding.  Physical Exam:  General: alert, cooperative and no distress Lochia:normal flow Chest: CTAB Heart: RRR no m/r/g Abdomen: +BS, soft, nontender,  Uterine Fundus: firm, below umbilicus DVT Evaluation: No calf swelling or tenderness Extremities: Trace edema   Recent Labs  08/12/16 1124 08/14/16 0610  HGB 10.8* 9.9*  HCT 31.8* 29.6*    Assessment/Plan:  ASSESSMENT: Ernest HaberLamyre S Torbert is a 31 y.o. [id number] 2960w0d s/p RLTCS with BTL  Plan for discharge tomorrow  Bottle feeding Contraception: BTL during C/S PICO dressing in place   LOS: 1 day   Jen MowElizabeth Mumaw, DO OB Fellow [facility], [facility]  08/14/2016, 11:17 AM

## 2016-08-14 NOTE — Addendum Note (Signed)
Addendum  created 08/14/16 1320 by Shanon PayorSuzanne M Barnie Sopko, CRNA   Sign clinical note

## 2016-08-14 NOTE — Discharge Summary (Deleted)
OB Discharge Summary     Patient Name: Ashley [name] DOB: 11/03/1985 MRN: [id number]  Date of admission: 08/13/2016 Delivering MD: Jen MowMUMAW, [facility]zan [facility]   Date of discharge: 08/14/2016  Admitting diagnosis: cpt [phone] and [id number] - REPEAT c section and undesired fertility Intrauterine pregnancy: 4217w0d     Secondary diagnosis:  Principal Problem:   Single liveborn, born in hospital, delivered by cesarean delivery Active Problems:   Status post repeat low transverse cesarean section   Status post tubal ligation at time of delivery, current hosp  Additional problems: None     Discharge diagnosis: Term Pregnancy Delivered                                                                                                Post partum procedures: Tubal ligation  Augmentation: None  Complications: None  Hospital course:  Sceduled C/S   31 y.o. yo [id number] at 4017w0d was admitted to the hospital 08/13/2016 for scheduled cesarean section with the following indication:Elective Repeat.  Membrane Rupture Time/Date: 11:29 AM ,08/13/2016   Patient delivered a Viable infant.08/13/2016  Details of operation can be found in separate operative note.  Pateint had an uncomplicated postpartum course.  She is ambulating, tolerating a regular diet, passing flatus, and urinating well. Patient is discharged home in stable condition on  08/14/16         Physical exam  Vitals:   08/13/16 1737 08/13/16 2130 08/14/16 0200 08/14/16 0524  BP: 134/68 127/61 (!) 118/54 (!) 114/53  Pulse: 80 84 86 76  Resp: 16 18 20 18   Temp: 98.2 F (36.8 C) 98.2 F (36.8 C) 98.5 F (36.9 C) 97.7 F (36.5 C)  TempSrc: Axillary     SpO2:  99% 99% 98%  Weight:      Height:       General: alert, cooperative and no distress Lochia: appropriate Uterine Fundus: firm Incision: Healing well with no significant drainage, No significant erythema, Dressing is clean, dry, and intact, PICO dressing intact with good suction. DVT  Evaluation: No evidence of DVT seen on physical exam. Negative Homan's sign. No cords or calf tenderness. Labs: Lab Results  Component Value Date   WBC 8.7 08/14/2016   HGB 9.9 (L) 08/14/2016   HCT 29.6 (L) 08/14/2016   MCV 75.9 (L) 08/14/2016   PLT 160 08/14/2016   CMP Latest Ref Rng & Units 08/28/2014  Glucose 70 - 99 mg/dL 469(G108(H)  BUN 6 - 23 mg/dL 6  Creatinine 2.950.50 - 2.841.10 mg/dL 1.320.67  Sodium 440135 - 102145 mmol/L 134(L)  Potassium 3.5 - 5.1 mmol/L 3.2(L)  Chloride 96 - 112 mmol/L 102  CO2 19 - 32 mmol/L 19  Calcium 8.4 - 10.5 mg/dL 8.8  Total Protein 6.0 - 8.3 g/dL 7.0  Total Bilirubin 0.3 - 1.2 mg/dL 0.8  Alkaline Phos 39 - 117 U/L 46  AST 0 - 37 U/L 20  ALT 0 - 35 U/L 18    Discharge instruction: per After Visit Summary and "Baby and Me Booklet".  After visit meds:  Allergies as of 08/14/2016  Reactions   Fruit Extracts Itching   Fresh Fruit       Medication List    TAKE these medications   acetaminophen 325 MG tablet Commonly known as:  TYLENOL Take 650 mg by mouth 2 (two) times daily as needed (for pain/headache.).   docusate sodium 100 MG capsule Commonly known as:  COLACE Take 1 capsule (100 mg total) by mouth 2 (two) times daily.   ibuprofen 600 MG tablet Commonly known as:  ADVIL,MOTRIN Take 1 tablet (600 mg total) by mouth every 6 (six) hours.   oxyCODONE-acetaminophen 5-325 MG tablet Commonly known as:  ROXICET Take 1-2 tablets by mouth every 4 (four) hours as needed for severe pain.   ranitidine 150 MG tablet Commonly known as:  ZANTAC Take 1 tablet (150 mg total) by mouth 2 (two) times daily.       Diet: routine diet  Activity: Advance as tolerated. Pelvic rest for 6 weeks.   Outpatient follow up:6 weeks Follow up Appt:Future Appointments Date Time Provider Department Center  09/23/2016 9:30 AM Brock Bad, MD [facility] None   Follow up Visit:No Follow-up on file.  Postpartum contraception: Tubal Ligation  Newborn Data: Live  born female  Birth Weight: 7 lb 6.5 oz (3360 g) APGAR: 9, 9  Baby Feeding: Bottle Disposition:home with mother   08/14/2016 Jen Mow, DO OB Fellow

## 2016-08-14 NOTE — Anesthesia Postprocedure Evaluation (Signed)
Anesthesia Post Note  Patient: Ashley [name]  Procedure(s) Performed: Procedure(s) (LRB): CESAREAN SECTION WITH BILATERAL TUBAL LIGATION (Bilateral)  Patient location during evaluation: Mother Baby Anesthesia Type: Spinal Level of consciousness: awake and alert and oriented Pain management: satisfactory to patient Vital Signs Assessment: post-procedure vital signs reviewed and stable Respiratory status: spontaneous breathing and nonlabored ventilation Cardiovascular status: stable Postop Assessment: no headache, no backache, patient able to bend at knees, no signs of nausea or vomiting and adequate PO intake Anesthetic complications: no        Last Vitals:  Vitals:   08/14/16 0200 08/14/16 0524  BP: (!) 118/54 (!) 114/53  Pulse: 86 76  Resp: 20 18  Temp: 36.9 C 36.5 C    Last Pain:  Vitals:   08/14/16 0524  TempSrc:   PainSc: 0-No pain   Pain Goal:                 Madison HickmanGREGORY,Klyde Banka

## 2016-08-14 NOTE — Discharge Instructions (Signed)

## 2016-08-15 ENCOUNTER — Encounter (HOSPITAL_COMMUNITY): Payer: Self-pay

## 2016-08-15 MED ORDER — OXYCODONE-ACETAMINOPHEN 5-325 MG PO TABS: 1.0000 | ORAL_TABLET | ORAL | 0 refills | Status: DC | PRN

## 2016-08-15 MED ORDER — IBUPROFEN 600 MG PO TABS: 600.0000 mg | ORAL_TABLET | Freq: Four times a day (QID) | ORAL | 2 refills | Status: DC

## 2016-08-15 NOTE — Plan of Care (Signed)
Problem: Physical Regulation: Goal: Will remain free from infection Outcome: Completed/Met Date Met: 08/15/16 Discharge education reviewed with patient along with how to care for incision and when to call the doctor.    

## 2016-08-15 NOTE — Progress Notes (Signed)
Subjective: Postpartum Day #2: Cesarean Delivery Patient reports incisional pain, tolerating PO, + flatus and no problems voiding.    Objective: Vital signs in last 24 hours: Temp:  [97.9 F (36.6 C)-98.1 F (36.7 C)] 97.9 F (36.6 C) (02/12 16100608) Pulse Rate:  [85-96] 96 (02/12 0608) Resp:  [16-19] 19 (02/12 [id number]) BP: (117-125)/(66-78) 117/78 (02/12 [id number])  Physical Exam:  General: alert, cooperative and no distress Lochia: appropriate Uterine Fundus: firm Incision: no significant drainage, no dehiscence, no significant erythema, PICO dressing C/D/I. DVT Evaluation: No evidence of DVT seen on physical exam. No cords or calf tenderness. No significant calf/ankle edema.   Recent Labs  08/12/16 1124 08/14/16 0610  HGB 10.8* 9.9*  HCT 31.8* 29.6*    Assessment/Plan: Status post Cesarean section. Doing well postoperatively.  Discharge home with standard precautions and return to clinic in 4-6 weeks.  Roe Coombsachelle A Olympia Adelsberger, CNM 08/15/2016, 7:40 AM

## 2016-08-15 NOTE — Discharge Summary (Signed)
OB Discharge Summary     Patient Name: Ashley [name] DOB: [birth year] MRN: [id number]  Date of admission: 08/13/2016 Delivering MD: Jen Mow [facility]   Date of discharge: 08/15/2016  Admitting diagnosis: cpt [phone] and 19147 - REPEAT c section and undesired fertility Intrauterine pregnancy: [redacted]w[redacted]d     Secondary diagnosis:  Principal Problem:   Single liveborn, born in hospital, delivered by cesarean delivery Active Problems:   Status post repeat low transverse cesarean section   Status post tubal ligation at time of delivery, current hosp  Additional problems: none     Discharge diagnosis: Term Pregnancy Delivered and RLTCS with BTL                                                                                                Post partum procedures:none  Augmentation: none  Complications: None  Hospital course:  Sceduled C/S   31 y.o. yo W2N5621 at [redacted]w[redacted]d was admitted to the hospital 08/13/2016 for scheduled cesarean section with the following indication:Elective Repeat.  Membrane Rupture Time/Date: 11:29 AM ,08/13/2016   Patient delivered a Viable infant.08/13/2016  Details of operation can be found in separate operative note.  Pateint had an uncomplicated postpartum course.  She is ambulating, tolerating a regular diet, passing flatus, and urinating well. Patient is discharged home in stable condition on  08/15/16         Physical exam  Vitals:   08/14/16 0200 08/14/16 0524 08/14/16 1820 08/15/16 0608  BP: (!) 118/54 (!) 114/53 125/66 117/78  Pulse: 86 76 85 96  Resp: 20 18 16 19   Temp: 98.5 F (36.9 C) 97.7 F (36.5 C) 98.1 F (36.7 C) 97.9 F (36.6 C)  TempSrc:   Oral Oral  SpO2: 99% 98%    Weight:      Height:       General: alert, cooperative and no distress Lochia: appropriate Uterine Fundus: firm Incision: No significant erythema, Dressing is clean, dry, and intact, PICO dressing DVT Evaluation: No evidence of DVT seen on physical exam. No cords  or calf tenderness. No significant calf/ankle edema. Labs: Lab Results  Component Value Date   WBC 8.7 08/14/2016   HGB 9.9 (L) 08/14/2016   HCT 29.6 (L) 08/14/2016   MCV 75.9 (L) 08/14/2016   PLT 160 08/14/2016   CMP Latest Ref Rng & Units 08/28/2014  Glucose 70 - 99 mg/dL 308(M)  BUN 6 - 23 mg/dL 6  Creatinine 5.78 - 4.69 mg/dL 6.29  Sodium 528 - 413 mmol/L 134(L)  Potassium 3.5 - 5.1 mmol/L 3.2(L)  Chloride 96 - 112 mmol/L 102  CO2 19 - 32 mmol/L 19  Calcium 8.4 - 10.5 mg/dL 8.8  Total Protein 6.0 - 8.3 g/dL 7.0  Total Bilirubin 0.3 - 1.2 mg/dL 0.8  Alkaline Phos 39 - 117 U/L 46  AST 0 - 37 U/L 20  ALT 0 - 35 U/L 18    Discharge instruction: per After Visit Summary and "Baby and Me Booklet".  After visit meds:  Allergies as of 08/15/2016      Reactions   Fruit Extracts Itching  Fresh Fruit       Medication List    TAKE these medications   acetaminophen 325 MG tablet Commonly known as:  TYLENOL Take 650 mg by mouth 2 (two) times daily as needed (for pain/headache.).   docusate sodium 100 MG capsule Commonly known as:  COLACE Take 1 capsule (100 mg total) by mouth 2 (two) times daily.   ibuprofen 600 MG tablet Commonly known as:  ADVIL,MOTRIN Take 1 tablet (600 mg total) by mouth every 6 (six) hours.   oxyCODONE-acetaminophen 5-325 MG tablet Commonly known as:  ROXICET Take 1-2 tablets by mouth every 4 (four) hours as needed for severe pain.   ranitidine 150 MG tablet Commonly known as:  ZANTAC Take 1 tablet (150 mg total) by mouth 2 (two) times daily.       Diet: routine diet  Activity: Advance as tolerated. Pelvic rest for 6 weeks.   Outpatient follow up:6 weeks Follow up Appt:Future Appointments Date Time Provider Department Center  09/23/2016 9:30 AM Brock Badharles A Harper, MD [facility] None   Follow up Visit:No Follow-up on file.  Postpartum contraception: Tubal Ligation  Newborn Data: Live born female  Birth Weight: 7 lb 6.5 oz (3360 g) APGAR:  9, 9  Baby Feeding: Bottle and Breast Disposition:home with mother   08/15/2016 Roe Coombsachelle A Denney, CNM

## 2016-08-15 NOTE — Lactation Note (Signed)
This note was copied from a baby's chart. Lactation Consultation Note  Mother being discharged today and has decided she would be interested in breastfeeding/pumping. RN provided mother with manual pump. LC reviewed hand expression bilaterally with mom and good flow of colostrum expressed.  Discussed applying ebm to nipples for soreness. Baby recently has a bottle of formula and is sleeping in crib. Suggest mother call if she would like assistance with latching. Reviewed engorgement care. Mom encouraged to feed baby 8-12 times/24 hours and with feeding cues.  Mom made aware of O/P services, breastfeeding support groups, community resources, and our phone # for post-discharge questions.     Patient Name: Boy Cristal FordLamyre Cuervo [id number]'s Date: 08/15/2016 Reason for consult: Initial assessment   Maternal Data Has patient been taught Hand Expression?: Yes Does the patient have breastfeeding experience prior to this delivery?: No  Feeding Feeding Type: Bottle Fed - Formula Nipple Type: Slow - flow  LATCH Score/Interventions                      Lactation Tools Discussed/Used     Consult Status Consult Status: Complete    Hardie PulleyBerkelhammer, Ruth Boschen 08/15/2016, 10:41 AM

## 2016-08-16 LAB — TYPE AND SCREEN
BLOOD PRODUCT EXPIRATION DATE: 201803032359
BLOOD PRODUCT EXPIRATION DATE: 201803052359
Blood Product Expiration Date: 201803082359
Blood Product Expiration Date: 201803082359
UNIT TYPE AND RH: 5100
UNIT TYPE AND RH: 5100
UNIT TYPE AND RH: 5100
Unit Type and Rh: 5100

## 2016-08-18 ENCOUNTER — Encounter (HOSPITAL_COMMUNITY): Payer: Self-pay | Admitting: *Deleted

## 2016-09-23 ENCOUNTER — Encounter: Payer: Self-pay | Admitting: Obstetrics

## 2016-09-23 ENCOUNTER — Ambulatory Visit (INDEPENDENT_AMBULATORY_CARE_PROVIDER_SITE_OTHER): Payer: Medicaid Other | Admitting: Obstetrics

## 2016-09-23 NOTE — Progress Notes (Signed)
Post Partum Exam  Ashley [name] is a 31 y.o. [id number] female who presents for a postpartum visit. She is 6 weeks postpartum following a low cervical transverse Cesarean section. I have fully reviewed the prenatal and intrapartum course. The delivery was at 39  gestational weeks.  Anesthesia: epidural. Postpartum course has been unremarkable. Baby's course has been unremarkable. Baby is feeding by bottle - Similac Advance. Bleeding currently on menses. Bowel function is normal. Bladder function is normal. Patient is sexually active. Contraception method is tubal ligation. Postpartum depression screening:neg  EPDS: 1  The following portions of the patient's history were reviewed and updated as appropriate: allergies, current medications, past family history, past medical history, past social history, past surgical history and problem list.  Review of Systems A comprehensive review of systems was negative.    Objective:    General:  alert and no distress   Breasts:  inspection negative, no nipple discharge or bleeding, no masses or nodularity palpable  Lungs: clear to auscultation bilaterally  Heart:  regular rate and rhythm, S1, S2 normal, no murmur, click, rub or gallop  Abdomen:  soft, nontender.  Incision C, D, I.   Vulva:  normal  Vagina: normal vagina  Cervix:  no cervical motion tenderness  Corpus: normal size, contour, position, consistency, mobility, non-tender  Adnexa:  no mass, fullness, tenderness  Rectal Exam: Not performed.        Assessment:    Normal postpartum exam. Pap smear not done at today's visit.   Plan:   1. Contraception: tubal ligation.  Done with C/S. 2. Annual and Pap next visit 3. Follow up in: 3 months or as needed.

## 2017-01-03 IMAGING — US US OB COMP LESS 14 WK
1 series · 15 of 28 positions shown · non-contrast
Comparison: None.

CLINICAL DATA: Morbid obesity. First trimester pregnancy with
inconclusive fetal viability. Gestational age by LMP of 11 weeks 2
days .

EXAM:
OBSTETRIC <14 WK US AND TRANSVAGINAL OB US
TECHNIQUE: Both transabdominal and transvaginal ultrasound examinations were
performed for complete evaluation of the gestation as well as the
maternal uterus, adnexal regions, and pelvic cul-de-sac.
Transvaginal technique was performed to assess early pregnancy.

[Series 1: us ob comp less 14 wk · 76 acquisitions, 15 frames shown]
[im 1/76]
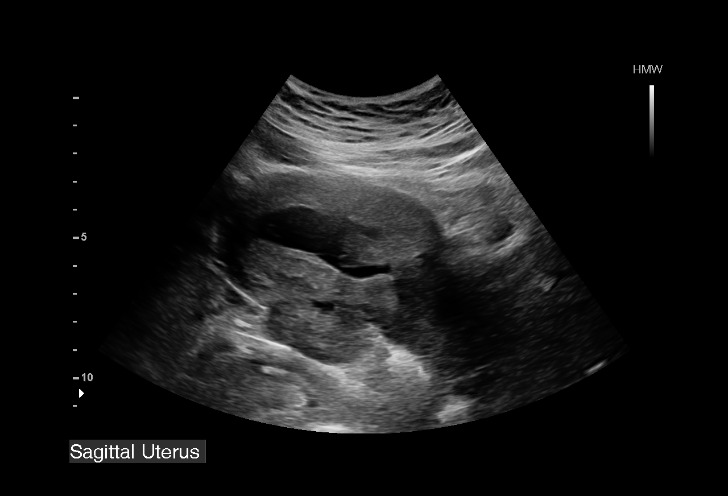
[im 6/76]
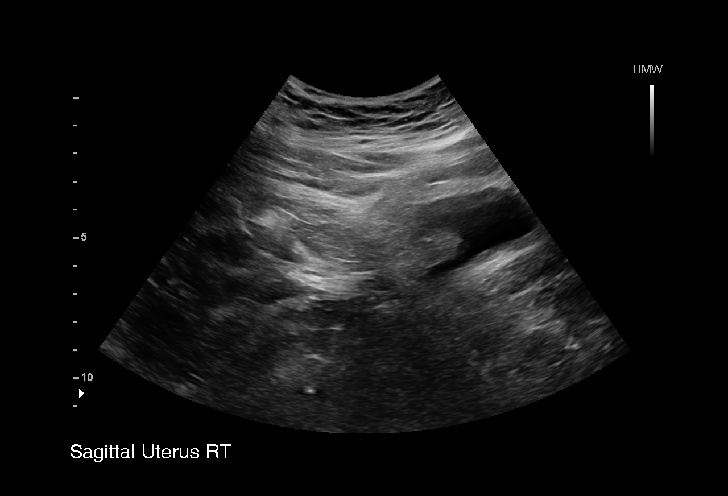
[im 12/76]
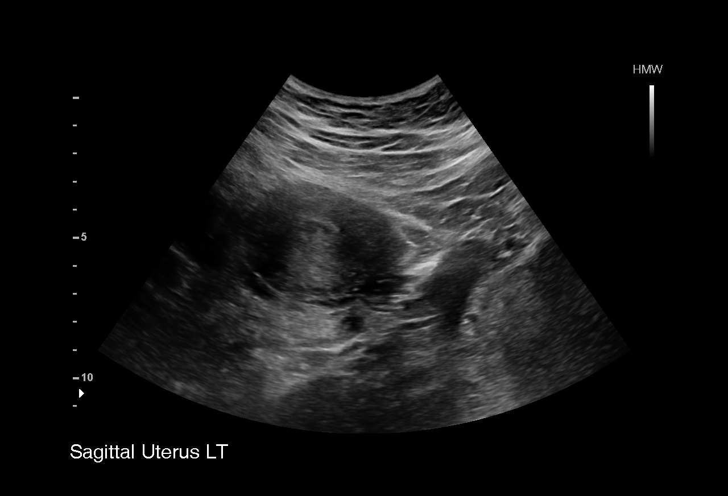
[im 17/76]
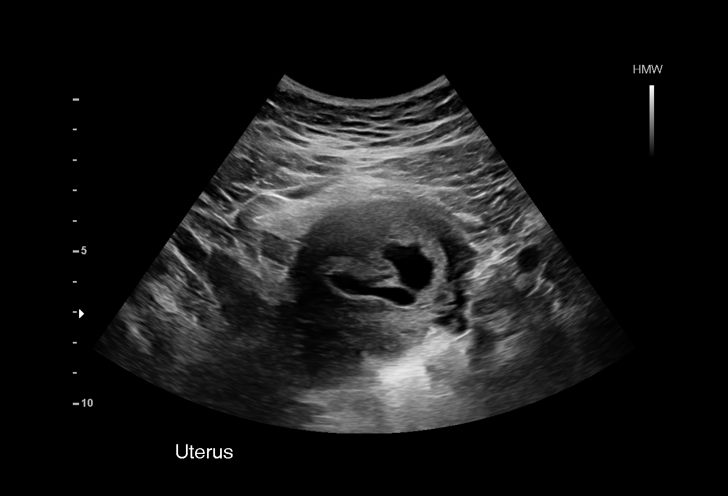
[im 23/76]
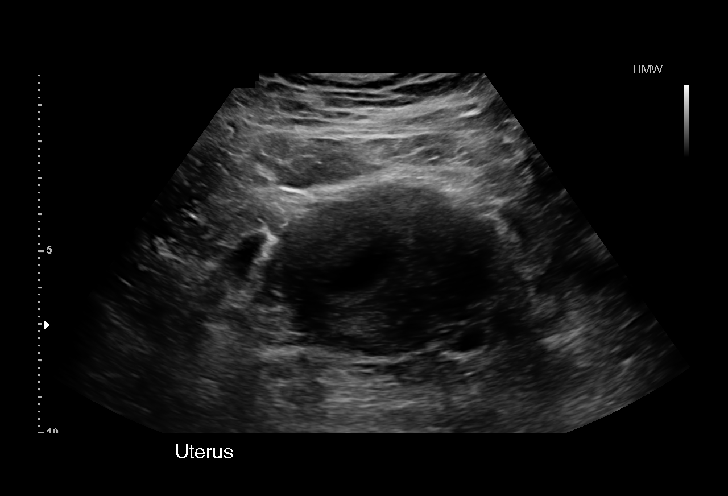
[im 28/76]
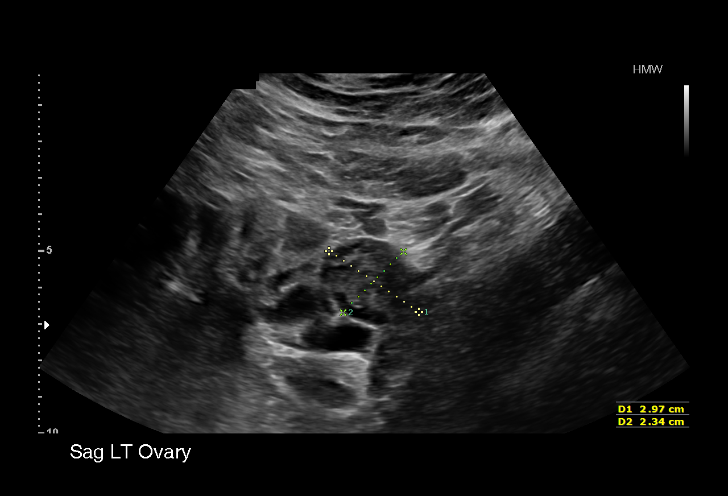
[im 34/76]
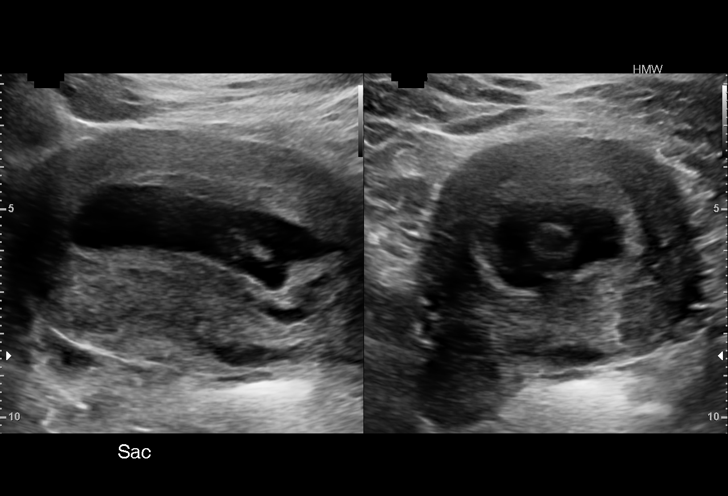
[im 39/76]
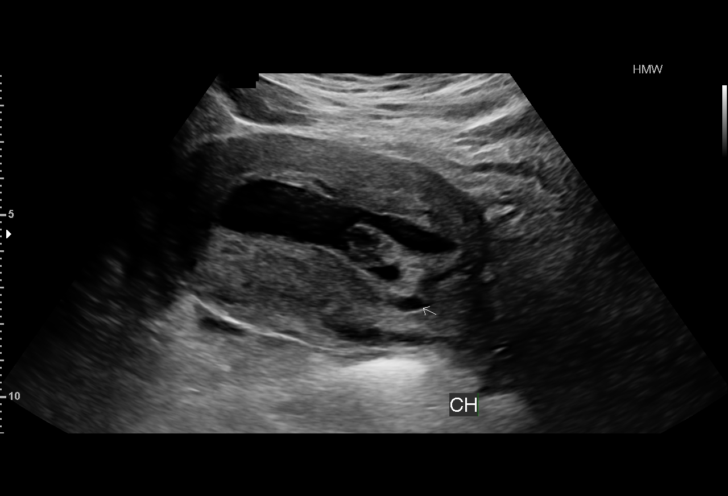
[im 42/76]
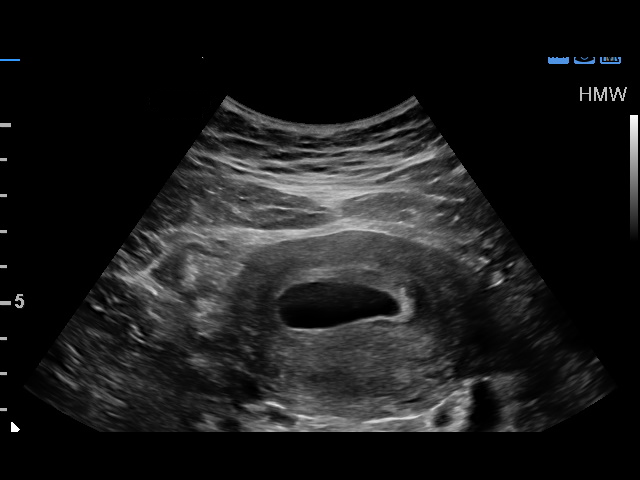
[im 48/76]
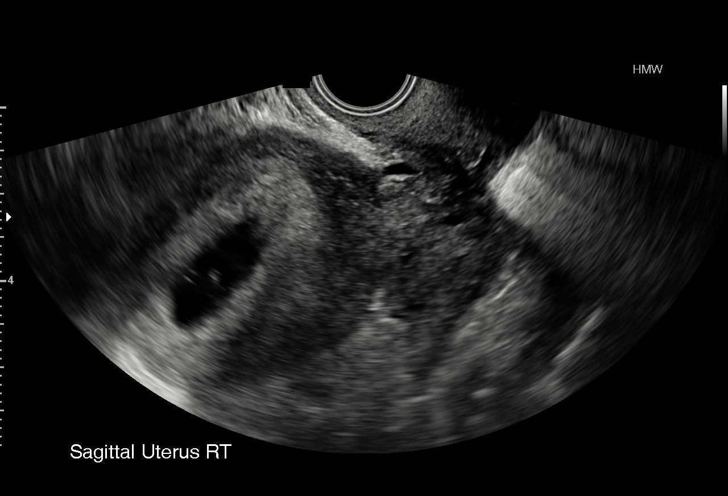
[im 53/76]
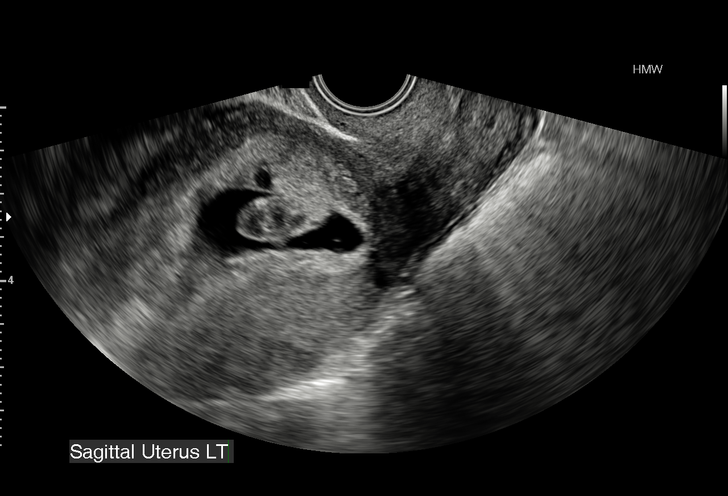
[im 59/76]
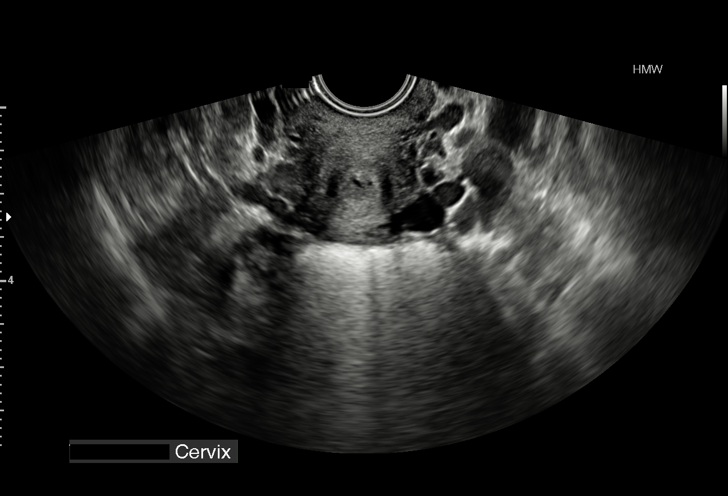
[im 64/76]
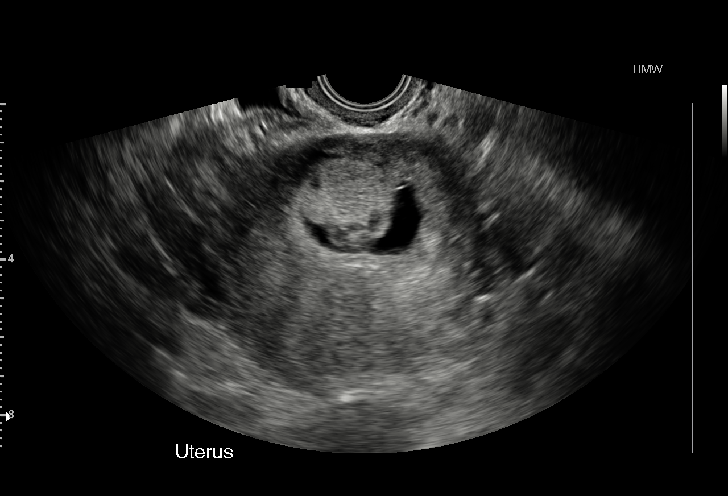
[im 70/76]
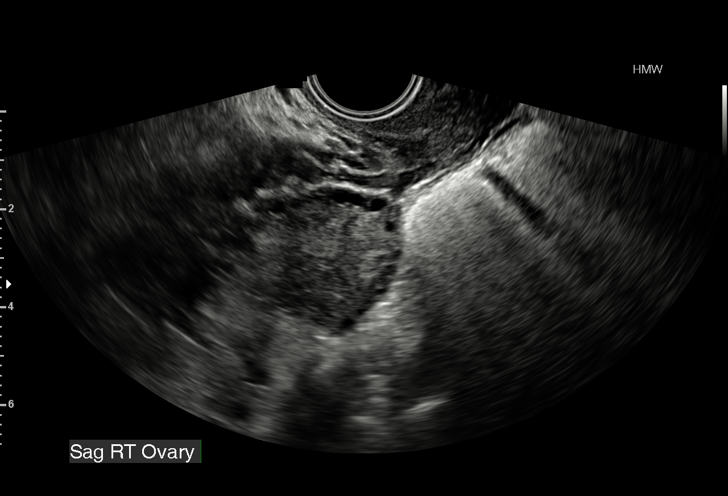
[im 76/76]
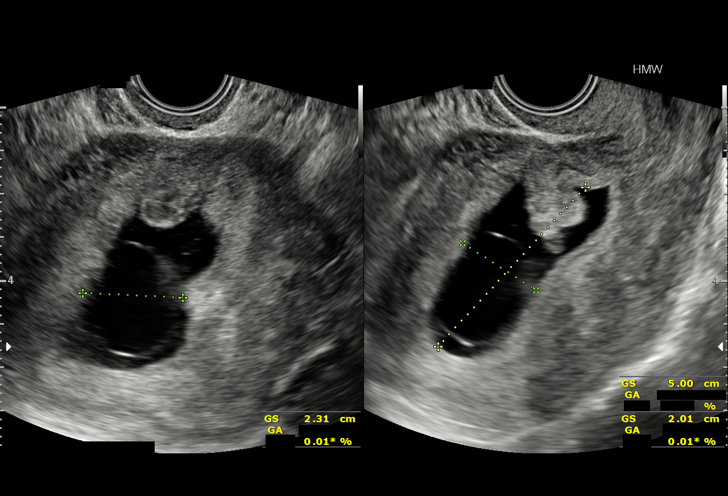

[15 of 28 positions shown; findings below may reference images not displayed]

FINDINGS: Intrauterine gestational sac: Single with irregular shape

Yolk sac:  None visualized; probable amnion noted

Embryo: Lobulated soft tissue density which may represent an
abnormal embryo

Cardiac Activity: None visualized

MSD: 31  mm   8 w   2  d

Subchorionic hemorrhage:  Small subchorionic hemorrhage noted.

Maternal uterus/adnexae: Both ovaries are normal in appearance. No
adnexal mass or free fluid identified.
IMPRESSION: Findings are strongly suspicious but not yet definitive for failed
pregnancy. Consider follow-up US in 7-14 days for definitive
diagnosis. This recommendation follows SRU consensus guidelines:
Diagnostic Criteria for Nonviable Pregnancy Early in the First
Trimester. N Engl J Med 5296; [DATE].

## 2017-04-28 ENCOUNTER — Encounter (HOSPITAL_COMMUNITY): Payer: Self-pay | Admitting: *Deleted

## 2017-04-28 ENCOUNTER — Ambulatory Visit (HOSPITAL_COMMUNITY)
Admission: EM | Admit: 2017-04-28 | Discharge: 2017-04-28 | Disposition: A | Discharge status: Home / Self Care | Payer: Medicaid Other | Attending: Emergency Medicine | Admitting: Emergency Medicine

## 2017-04-28 DIAGNOSIS — J029 Acute pharyngitis, unspecified: Secondary | ICD-10-CM | POA: Diagnosis not present

## 2017-04-28 DIAGNOSIS — K219 Gastro-esophageal reflux disease without esophagitis: Secondary | ICD-10-CM | POA: Insufficient documentation

## 2017-04-28 DIAGNOSIS — Z87891 Personal history of nicotine dependence: Secondary | ICD-10-CM | POA: Diagnosis not present

## 2017-04-28 DIAGNOSIS — R51 Headache: Secondary | ICD-10-CM | POA: Diagnosis present

## 2017-04-28 LAB — POCT RAPID STREP A: Streptococcus, Group A Screen (Direct): NEGATIVE

## 2017-04-28 LAB — POCT INFECTIOUS MONO SCREEN: MONO SCREEN: NEGATIVE

## 2017-04-28 MED ORDER — IBUPROFEN 600 MG PO TABS: 600.0000 mg | ORAL_TABLET | Freq: Four times a day (QID) | ORAL | 0 refills | Status: DC

## 2017-04-28 NOTE — ED Provider Notes (Signed)
HPI  SUBJECTIVE:  Patient reports sore throat starting 1 week ago.  Sx worse with swallowing and exposure to cold air.  Sx better with hot teas.  + Fever tmax 100.13 days ago + Swollen neck glands    No Cough/URI sxs + Myalgias + Headache No Rash     No Recent Strep or mono exposure.  Child recently with URI symptoms. No Abdominal Pain No reflux sxs No Allergy sxs  No Breathing difficulty, voice changes, sensation of throat swelling shot No Drooling Patient reports pain with fully opening her mouth last night, but denies trismus No abx in past month.  No antipyretic in past 4-6 hrs  LMP: One week ago.  Status post bilateral tubal ligation ZOX:WRUEAV, Kenney Houseman, MD    Past Medical History:  Diagnosis Date  . GERD (gastroesophageal reflux disease) 2012   diet controlled - no meds  . Headache(784.0)    3X WEEK  . 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 Irregular periods/menstrual cycles 01/30/2012  . Late prenatal care 01/30/2012  . Poor social situation 01/30/2012    Past Surgical History:  Procedure Laterality Date  . CESAREAN SECTION  2009  . CESAREAN SECTION  01/30/2012   Procedure: CESAREAN SECTION;  Surgeon: Michael Litter, MD;  Location: [facility];  Service: Gynecology;  Laterality: N/A;  Repeat C/S  . CESAREAN SECTION WITH BILATERAL TUBAL LIGATION Bilateral 08/13/2016   Procedure: CESAREAN SECTION WITH BILATERAL TUBAL LIGATION;  Surgeon: Lesly Dukes, MD;  Location: [facility];  Service: Obstetrics;  Laterality: Bilateral;  . CHOLECYSTECTOMY N/A 10/16/2013   Procedure: LAPAROSCOPIC CHOLECYSTECTOMY WITH INTRAOPERATIVE CHOLANGIOGRAM;  Surgeon: Wilmon Arms. Corliss Skains, MD;  Location: [facility];  Service: General;  Laterality: N/A;  . DILATION AND EVACUATION N/A 07/28/2015   Procedure: SUCTION, DILATATION AND EVACUATION;  Surgeon: Brock Bad, MD;  Location: [facility];  Service: Gynecology;  Laterality: N/A;  . TONSILLECTOMY  AGE 38  . TONSILLECTOMY AND ADENOIDECTOMY  AGEB 18     Family History  Problem Relation Age of Onset  . Asthma Mother   . Hypertension Mother   . Early death Mother        AGE 76  . Cancer Maternal Grandmother     Social History  Substance Use Topics  . Smoking status: Former Smoker    Packs/day: 0.00    Years: 6.00    Types: Cigarettes    Quit date: 11/01/2015  . Smokeless tobacco: Never Used  . Alcohol use No    No current facility-administered medications for this encounter.   Current Outpatient Prescriptions:  .  ibuprofen (ADVIL,MOTRIN) 600 MG tablet, Take 1 tablet (600 mg total) by mouth every 6 (six) hours., Disp: 30 tablet, Rfl: 0 .  ranitidine (ZANTAC) 150 MG tablet, Take 1 tablet (150 mg total) by mouth 2 (two) times daily. (Patient not taking: Reported on 08/08/2016), Disp: 60 tablet, Rfl: 6  Allergies  Allergen Reactions  . Fruit Extracts Itching    Fresh Fruit      ROS  As noted in HPI.   Physical Exam  BP 119/78 (BP Location: Left Arm)   Pulse 76   Temp 98.3 F (36.8 C) (Oral)   Resp 18   SpO2 95%   Constitutional: Well developed, well nourished, no acute distress Eyes:  EOMI, conjunctiva normal bilaterally HENT: Normocephalic, atraumatic,mucus membranes moist. + Mild nasal congestion + slightly erythematous oropharynx- enlarged tonsils- exudates. Uvula midline.  Respiratory: Normal inspiratory effort Cardiovascular: Normal rate, no murmurs, rubs,  gallops GI: nondistended, nontender. No appreciable splenomegaly skin: No rash, skin intact Lymph: Single tender right sided cervical LN  Musculoskeletal: no deformities Neurologic: Alert & oriented x 3, no focal neuro deficits Psychiatric: Speech and behavior appropriate.   ED Course   Medications - No data to display  Orders Placed This Encounter  Procedures  . Culture, group A strep    Standing Status:   Standing    Number of Occurrences:   1  . POCT rapid strep A [facility])    Standing Status:   Standing    Number of Occurrences:    1  . Infectious mono screen, POC    Standing Status:   Standing    Number of Occurrences:   1    Results for orders placed or performed during the hospital encounter of 04/28/17 (from the past 24 hour(s))  POCT rapid strep A [facility])     Status: None   Collection Time: 04/28/17 10:50 AM  Result Value Ref Range   Streptococcus, Group A Screen (Direct) NEGATIVE NEGATIVE  Infectious mono screen, POC     Status: None   Collection Time: 04/28/17 11:02 AM  Result Value Ref Range   Mono Screen NEGATIVE NEGATIVE   No results found.  ED Clinical Impression  Sore throat   ED Assessment/Plan  Rapid strep and mono negative. Obtaining throat culture to guide antibiotic treatment. Discussed this with patient. We'll contact them if culture is positive, and will call in Appropriate antibiotics.  In the meantime supportive treatment.  No evidence of peritonsillar abscess, retropharyngeal abscess or epiglottitis.  Patient home with ibuprofen, Tylenol, Benadryl/Maalox mixture. Patient to followup with PMD when necessary  Discussed labs, imaging, MDM, plan and followup with patient. Discussed sn/sx that should prompt return to the ED. patient agrees with plan.   Meds ordered this encounter  Medications  . ibuprofen (ADVIL,MOTRIN) 600 MG tablet    Sig: Take 1 tablet (600 mg total) by mouth every 6 (six) hours.    Dispense:  30 tablet    Refill:  0     *This clinic note was created using Scientist, clinical (histocompatibility and immunogenetics)Dragon dictation software. Therefore, there may be occasional mistakes despite careful proofreading.    Domenick GongMortenson, Adlean Hardeman, MD 04/28/17 1233

## 2017-04-28 NOTE — ED Triage Notes (Signed)
Patient reports 1 week history of being sick with sore throat.

## 2017-04-28 NOTE — Discharge Instructions (Signed)
your rapid strep was negative today, so we have sent off a throat culture.  We will contact you and call in the appropriate antibiotics if your culture comes back positive for an infection requiring antibiotic treatment.  Give us a working phone number.  I  1 gram of Tylenol and 600 mg ibuprofen together 3-4 times a day as needed for pain.  Make sure you drink plenty of extra fluids.  Some people find salt water gargles and  Traditional Medicinal's "Throat Coat" tea helpful. Take 5 mL of liquid Benadryl and 5 mL of Maalox. Mix it together, and then hold it in your mouth for as long as you can and then swallow. You may do this 4 times a day.    Go to www.goodrx.com to look up your medications. This will give you a list of where you can find your prescriptions at the most affordable prices. Or ask the pharmacist what the cash price is, or if they have any other discount programs available to help make your medication more affordable. This can be less expensive than what you would pay with insurance.

## 2017-04-30 ENCOUNTER — Telehealth (HOSPITAL_COMMUNITY): Payer: Self-pay | Admitting: Internal Medicine

## 2017-04-30 LAB — CULTURE, GROUP A STREP (THRC)

## 2017-04-30 NOTE — Telephone Encounter (Signed)
Clinical staff, please let patient know that throat culture is positive for a few non group A Strep.  Unclear whether this represents a true infection.  If patient is having persistent/severe sore throat and/or fever >100.5, ok to send rx for penicillin V 500mg  bid x 10d #20 no refills.  Recheck or followup with PCP for further evaluation if symptoms are not improving.  LM

## 2017-08-09 ENCOUNTER — Encounter (HOSPITAL_COMMUNITY): Payer: Self-pay | Admitting: Emergency Medicine

## 2017-08-09 ENCOUNTER — Emergency Department (HOSPITAL_COMMUNITY)
Admission: EM | Admit: 2017-08-09 | Discharge: 2017-08-09 | Disposition: A | Discharge status: Home / Self Care | Payer: Medicaid Other | Attending: Emergency Medicine | Admitting: Emergency Medicine

## 2017-08-09 ENCOUNTER — Other Ambulatory Visit: Payer: Self-pay

## 2017-08-09 DIAGNOSIS — M79604 Pain in right leg: Secondary | ICD-10-CM | POA: Insufficient documentation

## 2017-08-09 DIAGNOSIS — M549 Dorsalgia, unspecified: Secondary | ICD-10-CM | POA: Diagnosis not present

## 2017-08-09 DIAGNOSIS — Z5321 Procedure and treatment not carried out due to patient leaving prior to being seen by health care provider: Secondary | ICD-10-CM | POA: Diagnosis not present

## 2017-08-09 NOTE — ED Notes (Signed)
Pt stated she wanted to leave and that she couldn't wait

## 2017-08-09 NOTE — ED Triage Notes (Signed)
Pt c/o back pain that radiates down her right leg x 2 days. Denies urinary symptoms. Denies fall/trauma.

## 2017-09-12 ENCOUNTER — Encounter: Payer: Self-pay | Admitting: Obstetrics

## 2017-09-12 ENCOUNTER — Other Ambulatory Visit (HOSPITAL_COMMUNITY)
Admission: RE | Admit: 2017-09-12 | Discharge: 2017-09-12 | Disposition: A | Discharge status: Home / Self Care | Payer: Medicaid Other | Source: Ambulatory Visit | Attending: Obstetrics | Admitting: Obstetrics

## 2017-09-12 ENCOUNTER — Ambulatory Visit: Payer: Medicaid Other | Admitting: Obstetrics

## 2017-09-12 VITALS — BP 138/84 | HR 93 | Ht 64.0 in | Wt 278.0 lb

## 2017-09-12 DIAGNOSIS — Z0000 Encounter for general adult medical examination without abnormal findings: Secondary | ICD-10-CM

## 2017-09-12 DIAGNOSIS — Z124 Encounter for screening for malignant neoplasm of cervix: Secondary | ICD-10-CM | POA: Insufficient documentation

## 2017-09-12 DIAGNOSIS — N946 Dysmenorrhea, unspecified: Secondary | ICD-10-CM

## 2017-09-12 DIAGNOSIS — Z01419 Encounter for gynecological examination (general) (routine) without abnormal findings: Secondary | ICD-10-CM

## 2017-09-12 DIAGNOSIS — E569 Vitamin deficiency, unspecified: Secondary | ICD-10-CM

## 2017-09-12 MED ORDER — VITAFOL ULTRA 29-0.6-0.4-200 MG PO CAPS: 1.0000 | ORAL_CAPSULE | Freq: Every day | ORAL | 11 refills | Status: DC

## 2017-09-12 MED ORDER — IBUPROFEN 800 MG PO TABS: 800.0000 mg | ORAL_TABLET | Freq: Three times a day (TID) | ORAL | 5 refills | Status: DC | PRN

## 2017-09-12 NOTE — Progress Notes (Signed)
LMP: 08/25/17 Mammogram: N/A  Last Pap: 06/23/2015 WNL  Colonoscopy: N/A  Contraception: BTL  [facility]   STD Screening: Declines  CC: None today.

## 2017-09-12 NOTE — Progress Notes (Signed)
Subjective:        Ashley [name] is a 32 y.o. female here for a routine exam.  Current complaints: None.    Personal health questionnaire:  Is patient [name], have a family history of breast and/or ovarian cancer: no Is there a family history of uterine cancer diagnosed at age < 87, gastrointestinal cancer, urinary tract cancer, family member who is a Personnel officer syndrome-associated carrier: no Is the patient overweight and hypertensive, family history of diabetes, personal history of gestational diabetes, preeclampsia or PCOS: no Is patient over 41, have PCOS,  family history of premature CHD under age 104, diabetes, smoke, have hypertension or peripheral artery disease:  no At any time, has a partner hit, kicked or otherwise hurt or frightened you?: no Over the past 2 weeks, have you felt down, depressed or hopeless?: no Over the past 2 weeks, have you felt little interest or pleasure in doing things?:no   Gynecologic History Patient's last menstrual period was 08/25/2017 (approximate). Contraception: tubal ligation Last Pap: 2016. Results were: normal Last mammogram: n/a. Results were: n/a  Obstetric History OB History  Gravida Para Term Preterm AB Living  4 3 3   1 2   SAB TAB Ectopic Multiple Live Births  1     0 2    # Outcome Date GA Lbr Len/2nd Weight Sex Delivery Anes PTL Lv  4 Term 08/13/16 [redacted]w[redacted]d  7 lb 6.5 oz (3.36 kg) M CS-LTranv Spinal  LIV     Birth Comments: normal term female infant  3 SAB 08/11/15 [redacted]w[redacted]d         2 Term 01/30/12 [redacted]w[redacted]d  7 lb 2.1 oz (3.235 kg) M CS-LTranv Spinal  LIV  1 Term 08/12/07 [redacted]w[redacted]d 06:00 7 lb 7 oz (3.374 kg) M CS-LTranv EPI  LIV     Birth Comments: [name]; [name]    Obstetric Comments  NO PRENATAL CARE WITH FIRST PREGNANCY. 36-38 WKS  GEATATION.  "DID NOT KNOW SHE WAS PREGNANT UNTIL DELIVERY."    Past Medical History:  Diagnosis Date  . GERD (gastroesophageal reflux disease) 2012   diet controlled -  no meds  . Headache(784.0)    3X WEEK  . 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 Irregular periods/menstrual cycles 01/30/2012  . Late prenatal care 01/30/2012  . Poor social situation 01/30/2012    Past Surgical History:  Procedure Laterality Date  . CESAREAN SECTION  2009  . CESAREAN SECTION  01/30/2012   Procedure: CESAREAN SECTION;  Surgeon: Michael Litter, MD;  Location: [facility];  Service: Gynecology;  Laterality: N/A;  Repeat C/S  . CESAREAN SECTION WITH BILATERAL TUBAL LIGATION Bilateral 08/13/2016   Procedure: CESAREAN SECTION WITH BILATERAL TUBAL LIGATION;  Surgeon: Lesly Dukes, MD;  Location: [facility];  Service: Obstetrics;  Laterality: Bilateral;  . CHOLECYSTECTOMY N/A 10/16/2013   Procedure: LAPAROSCOPIC CHOLECYSTECTOMY WITH INTRAOPERATIVE CHOLANGIOGRAM;  Surgeon: Wilmon Arms. Corliss Skains, MD;  Location: [facility];  Service: General;  Laterality: N/A;  . DILATION AND EVACUATION N/A 07/28/2015   Procedure: SUCTION, DILATATION AND EVACUATION;  Surgeon: Brock Bad, MD;  Location: [facility];  Service: Gynecology;  Laterality: N/A;  . TONSILLECTOMY  AGE 27  . TONSILLECTOMY AND ADENOIDECTOMY  AGEB 18     Current Outpatient Medications:  .  ibuprofen (ADVIL,MOTRIN) 600 MG tablet, Take 1 tablet (600 mg total) by mouth every 6 (six) hours. (Patient not taking: Reported on 09/12/2017), Disp: 30 tablet, Rfl: 0 .  ranitidine (ZANTAC)  150 MG tablet, Take 1 tablet (150 mg total) by mouth 2 (two) times daily. (Patient not taking: Reported on 08/08/2016), Disp: 60 tablet, Rfl: 6 Allergies  Allergen Reactions  . Fruit Extracts Itching    Fresh Fruit     Social History   Tobacco Use  . Smoking status: Former Smoker    Packs/day: 0.00    Years: 6.00    Pack years: 0.00    Types: Cigarettes    Last attempt to quit: 11/01/2015    Years since quitting: 1.8  . Smokeless tobacco: Never Used  Substance Use Topics  . Alcohol use: No    Alcohol/week: 0.0 oz    Family History  Problem Relation Age of  Onset  . Asthma Mother   . Hypertension Mother   . Early death Mother        AGE 34  . Cancer Maternal Grandmother       Review of Systems  Constitutional: negative for fatigue and weight loss Respiratory: negative for cough and wheezing Cardiovascular: negative for chest pain, fatigue and palpitations Gastrointestinal: negative for abdominal pain and change in bowel habits Musculoskeletal:negative for myalgias Neurological: negative for gait problems and tremors Behavioral/Psych: negative for abusive relationship, depression Endocrine: negative for temperature intolerance    Genitourinary:negative for abnormal menstrual periods, genital lesions, hot flashes, sexual problems and vaginal discharge Integument/breast: negative for breast lump, breast tenderness, nipple discharge and skin lesion(s)    Objective:       BP 138/84   Pulse 93   Ht 5\' 4"  (1.626 m)   Wt 278 lb (126.1 kg)   LMP 08/25/2017 (Approximate)   Breastfeeding? No   BMI 47.72 kg/m  General:   alert  Skin:   no rash or abnormalities  Lungs:   clear to auscultation bilaterally  Heart:   regular rate and rhythm, S1, S2 normal, no murmur, click, rub or gallop  Breasts:   normal without suspicious masses, skin or nipple changes or axillary nodes  Abdomen:  normal findings: no organomegaly, soft, non-tender and no hernia  Pelvis:  External genitalia: normal general appearance Urinary system: urethral meatus normal and bladder without fullness, nontender Vaginal: normal without tenderness, induration or masses Cervix: normal appearance Adnexa: normal bimanual exam Uterus: anteverted and non-tender, normal size   Lab Review Urine pregnancy test Labs reviewed yes Radiologic studies reviewed no  50% of 20 min visit spent on counseling and coordination of care.   Assessment:     1. Encounter for routine gynecological examination with Papanicolaou smear of cervix - doing well  2. Screening for cervical  cancer Rx: - Cytology - PAP    Plan:    Education reviewed: calcium supplements, depression evaluation, low fat, low cholesterol diet, safe sex/STD prevention, self breast exams and weight bearing exercise. Contraception: tubal ligation. Follow up in: 1 year.   No orders of the defined types were placed in this encounter.  No orders of the defined types were placed in this encounter.   Brock BadHARLES A. Latese Dufault MD

## 2017-09-13 LAB — CYTOLOGY - PAP
ADEQUACY: ABSENT
Diagnosis: NEGATIVE
HPV: NOT DETECTED

## 2018-01-17 ENCOUNTER — Encounter (HOSPITAL_COMMUNITY): Payer: Self-pay | Admitting: Emergency Medicine

## 2018-01-17 ENCOUNTER — Ambulatory Visit (HOSPITAL_COMMUNITY)
Admission: EM | Admit: 2018-01-17 | Discharge: 2018-01-17 | Disposition: A | Discharge status: Home / Self Care | Payer: Medicaid Other | Attending: Internal Medicine | Admitting: Internal Medicine

## 2018-01-17 DIAGNOSIS — B9789 Other viral agents as the cause of diseases classified elsewhere: Secondary | ICD-10-CM | POA: Diagnosis not present

## 2018-01-17 DIAGNOSIS — R05 Cough: Secondary | ICD-10-CM | POA: Diagnosis not present

## 2018-01-17 DIAGNOSIS — J069 Acute upper respiratory infection, unspecified: Secondary | ICD-10-CM

## 2018-01-17 DIAGNOSIS — J029 Acute pharyngitis, unspecified: Secondary | ICD-10-CM | POA: Diagnosis not present

## 2018-01-17 LAB — POCT RAPID STREP A: STREPTOCOCCUS, GROUP A SCREEN (DIRECT): NEGATIVE

## 2018-01-17 MED ORDER — BENZONATATE 200 MG PO CAPS: 200.0000 mg | ORAL_CAPSULE | Freq: Three times a day (TID) | ORAL | 0 refills | Status: AC | PRN

## 2018-01-17 MED ORDER — AZITHROMYCIN 250 MG PO TABS: 250.0000 mg | ORAL_TABLET | Freq: Every day | ORAL | 0 refills | Status: DC

## 2018-01-17 MED ORDER — CETIRIZINE HCL 10 MG PO CAPS: 10.0000 mg | ORAL_CAPSULE | Freq: Every day | ORAL | 0 refills | Status: DC

## 2018-01-17 NOTE — ED Triage Notes (Signed)
Pt c/o off and on sore throat since July 4th

## 2018-01-17 NOTE — Discharge Instructions (Signed)

## 2018-01-18 NOTE — ED Provider Notes (Signed)
[facility]    CSN: [id number] Arrival date & time: 01/17/18  1931     History   Chief Complaint Chief Complaint  Patient presents with  . Sore Throat    HPI Ashley [name] is a 32 y.o. female history of tobacco use presenting today for evaluation of a cough and sore throat.  Patient states that she has had symptoms since July 4, approximately a week and a half ago, symptoms did improve, but have returned again over the past 3 to 4 days.  Her main complaint is her sore throat and discomfort on the left side of her throat.  As well as her cough.  Cough is dry.  Is having mild to congestion.  Denies any fevers.  Eating and drinking like normal.  She has tried Tylenol without relief.  HPI  Past Medical History:  Diagnosis Date  . GERD (gastroesophageal reflux disease) 2012   diet controlled - no meds  . Headache(784.0)    3X WEEK  . 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 Irregular periods/menstrual cycles 01/30/2012  . Late prenatal care 01/30/2012  . Poor social situation 01/30/2012    Patient Active Problem List   Diagnosis Date Noted  . Status post repeat low transverse cesarean section 08/14/2016  . Status post tubal ligation at time of delivery, current hosp 08/14/2016  . Single liveborn, born in hospital, delivered by cesarean delivery 08/13/2016  . GBS bacteriuria 03/24/2016  . Substance abuse complicating pregnancy, antepartum 02/04/2016  . Previous cesarean section complicating pregnancy, antepartum condition or complication 01/27/2016  . Encounter for supervision of other normal pregnancy 01/27/2016  . Irregular periods/menstrual cycles 01/30/2012  . Obesity 11/13/2011    Past Surgical History:  Procedure Laterality Date  . CESAREAN SECTION  2009  . CESAREAN SECTION  01/30/2012   Procedure: CESAREAN SECTION;  Surgeon: Michael Litter, MD;  Location: [facility];  Service: Gynecology;  Laterality: N/A;  Repeat C/S  . CESAREAN SECTION WITH BILATERAL TUBAL  LIGATION Bilateral 08/13/2016   Procedure: CESAREAN SECTION WITH BILATERAL TUBAL LIGATION;  Surgeon: Lesly Dukes, MD;  Location: [facility] B[facility]THING SUIT[facility];  Service: Obstetrics;  Laterality: Bilateral;  . CHOLECYSTECTOMY N/A 10/16/2013   Procedure: LAPAROSCOPIC CHOLECYSTECTOMY WITH INTRAOPERATIVE CHOLANGIOGRAM;  Surgeon: Wilmon Arms. Corliss Skains, MD;  Location: [facility];  Service: General;  Laterality: N/A;  . DILATION AND EVACUATION N/A 07/28/2015   Procedure: SUCTION, DILATATION AND EVACUATION;  Surgeon: Brock Bad, MD;  Location: [facility];  Service: Gynecology;  Laterality: N/A;  . TONSILLECTOMY  AGE 27  . TONSILLECTOMY AND ADENOIDECTOMY  AGEB 18    OB History    Gravida  4   Para  3   Term  3   Preterm      AB  1   Living  2     SAB  1   TAB      Ectopic      Multiple  0   Live Births  2        Obstetric Comments  NO PRENATAL CARE WITH FIRST PREGNANCY. 36-38 WKS  GEATATION.  "DID NOT KNOW SHE WAS PREGNANT UNTIL DELIVERY."         Home Medications    Prior to Admission medications   Medication Sig Start Date End Date Taking? Authorizing Provider  azithromycin (ZITHROMAX) 250 MG tablet Take 1 tablet (250 mg total) by mouth daily. Take first 2 tablets together, then 1 every day until finished. 01/17/18   Ainsleigh Kakos,  Oather Muilenburg C, PA-C  benzonatate (TESSALON) 200 MG capsule Take 1 capsule (200 mg total) by mouth 3 (three) times daily as needed for up to 7 days for cough. 01/17/18 01/24/18  Jenney Brester C, PA-C  Cetirizine HCl 10 MG CAPS Take 1 capsule (10 mg total) by mouth daily for 10 days. 01/17/18 01/27/18  Khalil Szczepanik C, PA-C  Prenat-Fe Poly-Methfol-FA-DHA (VITAFOL ULTRA) 29-0.6-0.4-200 MG CAPS Take 1 capsule by mouth daily before breakfast. 09/12/17   Brock Bad, MD    Family History Family History  Problem Relation Age of Onset  . Asthma Mother   . Hypertension Mother   . Early death Mother        AGE 42  . Cancer Maternal Grandmother     Social  History Social History   Tobacco Use  . Smoking status: Former Smoker    Packs/day: 0.00    Years: 6.00    Pack years: 0.00    Types: Cigarettes    Last attempt to quit: 11/01/2015    Years since quitting: 2.2  . Smokeless tobacco: Never Used  Substance Use Topics  . Alcohol use: No    Alcohol/week: 0.0 oz  . Drug use: No     Allergies   Fruit extracts   Review of Systems Review of Systems  Constitutional: Negative for activity change, appetite change, chills, fatigue and fever.  HENT: Positive for congestion, rhinorrhea and sore throat. Negative for ear pain, sinus pressure and trouble swallowing.   Eyes: Negative for discharge and redness.  Respiratory: Positive for cough. Negative for chest tightness and shortness of breath.   Cardiovascular: Negative for chest pain.  Gastrointestinal: Negative for abdominal pain, diarrhea, nausea and vomiting.  Musculoskeletal: Negative for myalgias.  Skin: Negative for rash.  Neurological: Negative for dizziness, light-headedness and headaches.     Physical Exam Triage Vital Signs ED Triage Vitals [01/17/18 2001]  Enc Vitals Group     BP 133/86     Pulse Rate 86     Resp 18     Temp 98.6 F (37 C)     Temp src      SpO2 100 %     Weight      Height      Head Circumference      Peak Flow      Pain Score      Pain Loc      Pain Edu?      Excl. in GC?    No data found.  Updated Vital Signs BP 133/86   Pulse 86   Temp 98.6 F (37 C)   Resp 18   SpO2 100%   Visual Acuity Right Eye Distance:   Left Eye Distance:   Bilateral Distance:    Right Eye Near:   Left Eye Near:    Bilateral Near:     Physical Exam  Constitutional: She appears well-developed and well-nourished. No distress.  HENT:  Head: Normocephalic and atraumatic.  Bilateral ears without tenderness to palpation of external auricle, tragus and mastoid, EAC's without erythema or swelling, TM's with good bony landmarks and cone of light. Non  erythematous.  Oral mucosa pink and moist, no tonsillar enlargement or exudate. Posterior pharynx patent and erythematous, no uvula deviation or swelling. Normal phonation.  Eyes: Conjunctivae are normal.  Neck: Neck supple.  Cardiovascular: Normal rate and regular rhythm.  No murmur heard. Pulmonary/Chest: Effort normal and breath sounds normal. No respiratory distress.  Breathing comfortably at rest, CTABL, no  wheezing, rales or other adventitious sounds auscultated Occasional dry cough in room  Abdominal: Soft. There is no tenderness.  Musculoskeletal: She exhibits no edema.  Neurological: She is alert.  Skin: Skin is warm and dry.  Psychiatric: She has a normal mood and affect.  Nursing note and vitals reviewed.    UC Treatments / Results  Labs (all labs ordered are listed, but only abnormal results are displayed) Labs Reviewed  CULTURE, GROUP A STREP [facility])  POCT RAPID STREP A    EKG None  Radiology No results found.  Procedures Procedures (including critical care time)  Medications Ordered in UC Medications - No data to display  Initial Impression / Assessment and Plan / UC Course  I have reviewed the triage vital signs and the nursing notes.  Pertinent labs & imaging results that were available during my care of the patient were reviewed by me and considered in my medical decision making (see chart for details).     Strep test negative, exam unremarkable, vital signs stable.  Lungs CTA BL.  Recommend patient to continue symptomatic management will add in daily Zyrtec as well as Tessalon.  Provided printed prescription for azithromycin for patient to fill on 7/20 symptoms not having any improvement with recommendations today.Discussed strict return precautions. Patient verbalized understanding and is agreeable with plan.  Final Clinical Impressions(s) / UC Diagnoses   Final diagnoses:  Viral URI with cough     Discharge Instructions     Sore Throat   Your rapid strep tested Negative today. We will send for a culture and call in about 2 days if results are positive. For now we will treat your sore throat as a virus with symptom management.   Please continue Tylenol or Ibuprofen for fever and pain. May try salt water gargles, cepacol lozenges, throat spray, or OTC cold relief medicine for throat discomfort. If you also have congestion take a daily anti-histamine like Zyrtec, Claritin, and a oral decongestant to help with post nasal drip that may be irritating your throat.   Stay hydrated and drink plenty of fluids to keep your throat coated relieve irritation.     ED Prescriptions    Medication Sig Dispense Auth. Provider   Cetirizine HCl 10 MG CAPS Take 1 capsule (10 mg total) by mouth daily for 10 days. 10 capsule Yandell Mcjunkins C, PA-C   benzonatate (TESSALON) 200 MG capsule Take 1 capsule (200 mg total) by mouth 3 (three) times daily as needed for up to 7 days for cough. 28 capsule Tobenna Needs C, PA-C   azithromycin (ZITHROMAX) 250 MG tablet Take 1 tablet (250 mg total) by mouth daily. Take first 2 tablets together, then 1 every day until finished. 6 tablet Pj Zehner, [place]thing[place] SpringsHallie C, PA-C     Controlled Substance Prescriptions [place] Controlled Substance Registry consulted? Not Applicable   Lew DawesWieters, Angelino Rumery C, [place] JerseyPA-C 01/18/18 1332

## 2018-01-20 LAB — CULTURE, GROUP A STREP (THRC)

## 2019-12-17 ENCOUNTER — Other Ambulatory Visit: Payer: Self-pay

## 2019-12-17 ENCOUNTER — Ambulatory Visit: Payer: Medicaid Other | Admitting: Physician Assistant

## 2019-12-17 VITALS — BP 137/81 | HR 86 | Temp 98.7°F | Resp 18 | Ht 63.0 in | Wt 315.0 lb

## 2019-12-17 DIAGNOSIS — G43019 Migraine without aura, intractable, without status migrainosus: Secondary | ICD-10-CM

## 2019-12-17 DIAGNOSIS — Z713 Dietary counseling and surveillance: Secondary | ICD-10-CM | POA: Diagnosis not present

## 2019-12-17 MED ORDER — SUMATRIPTAN SUCCINATE 50 MG PO TABS: 50.0000 mg | ORAL_TABLET | ORAL | 0 refills | Status: DC | PRN

## 2019-12-17 MED ORDER — IBUPROFEN 800 MG PO TABS: 800.0000 mg | ORAL_TABLET | Freq: Three times a day (TID) | ORAL | 0 refills | Status: DC | PRN

## 2019-12-17 NOTE — Patient Instructions (Addendum)
I have sent a medication called sumatriptan hand to your pharmacy, please first try ibuprofen 800 mg, if this does not resolve your headache, then you may try the sumatriptan hand.  You may repeat the sumatriptan in 2 hours, but do not use it more than 2 in a 24-hour.  I would also like you to start taking vitamin D 2000 units over-the-counter daily.   Please return to the mobile unit next week for follow-up, we will also do fasting labs at that time.    As we discussed, I would like you to keep a diary over the next week of your headaches, pay attention to what you ate prior, what was going on to see if you can see any type of trigger for your headaches.  Continue to work on increasing your hydration  Work on mindful eating over the next week, ask your self the following questions before eating, am Ashley [name]?  Am I thirsty?  Is it realistic I am hungry?  Is there something else I need right now besides food.  I look forward to seeing you next week  Ashley Jaffe, PA-C Physician Assistant [facility] [email or web address]  Form - Headache Record There are many types and causes of headaches. A headache record can help guide your treatment plan. Use this form to record the details. Bring this form with you to your follow-up visits. Follow your health care provider's instructions on how to describe your headache. You may be asked to:  Use a pain scale. This is a tool to rate the intensity of your headache using words or numbers.  Describe what your headache feels like, such as dull, achy, throbbing, or sharp. Headache record Date: _______________ Time (from start to end): ____________________ Location of the headache: _________________________  Intensity of the headache: ____________________ Description of the headache: ______________________________________________________________  Hours of sleep the night before the headache:  __________  Food or drinks before the headache started: ______________________________________________________________________________________  Events before the headache started: _______________________________________________________________________________________________  Symptoms before the headache started: __________________________________________________________________________________________  Symptoms during the headache: __________________________________________________________________________________________________  Treatment: ________________________________________________________________________________________________________________  Effect of treatment: _________________________________________________________________________________________________________  Other comments: ___________________________________________________________________________________________________________ Date: _______________ Time (from start to end): ____________________ Location of the headache: _________________________  Intensity of the headache: ____________________ Description of the headache: ______________________________________________________________  Hours of sleep the night before the headache: __________  Food or drinks before the headache started: ______________________________________________________________________________________  Events before the headache started: ____________________________________________________________________________________________  Symptoms before the headache started: _________________________________________________________________________________________  Symptoms during the headache: _______________________________________________________________________________________________  Treatment: ________________________________________________________________________________________________________________  Effect of treatment:  _________________________________________________________________________________________________________  Other comments: ___________________________________________________________________________________________________________ Date: _______________ Time (from start to end): ____________________ Location of the headache: _________________________  Intensity of the headache: ____________________ Description of the headache: ______________________________________________________________  Hours of sleep the night before the headache: __________  Food or drinks before the headache started: ______________________________________________________________________________________  Events before the headache started: ____________________________________________________________________________________________  Symptoms before the headache started: _________________________________________________________________________________________  Symptoms during the headache: _______________________________________________________________________________________________  Treatment: ________________________________________________________________________________________________________________  Effect of treatment: _________________________________________________________________________________________________________  Other comments: ___________________________________________________________________________________________________________ Date: _______________ Time (from start to end): ____________________ Location of the headache: _________________________  Intensity of the headache: ____________________ Description of the headache: ______________________________________________________________  Hours of sleep the night before the headache: _________  Food or drinks before the headache started: ______________________________________________________________________________________  Events before the  headache started: ____________________________________________________________________________________________  Symptoms before the headache started: _________________________________________________________________________________________  Symptoms during the headache: _______________________________________________________________________________________________  Treatment: ________________________________________________________________________________________________________________  Effect of treatment: _________________________________________________________________________________________________________  Other comments: ___________________________________________________________________________________________________________ Date: _______________ Time (from  start to end): ____________________ Location of the headache: _________________________  Intensity of the headache: ____________________ Description of the headache: ______________________________________________________________  Hours of sleep the night before the headache: _________  Food or drinks before the headache started: ______________________________________________________________________________________  Events before the headache started: ____________________________________________________________________________________________  Symptoms before the headache started: _________________________________________________________________________________________  Symptoms during the headache: _______________________________________________________________________________________________  Treatment: ________________________________________________________________________________________________________________  Effect of treatment: _________________________________________________________________________________________________________  Other comments:  ___________________________________________________________________________________________________________ This information is not intended to replace advice given to you by your health care provider. Make sure you discuss any questions you have with your health care provider. Document Revised: 07/09/2018 Document Reviewed: 07/09/2018 Elsevier Patient Education  [place].

## 2019-12-17 NOTE — Progress Notes (Signed)
New Patient Office Visit  Subjective:  Patient ID: Ashley [name], female    DOB: [birth year]  Age: 34 y.o. MRN: [id number]  CC:  Chief Complaint  Patient presents with  . Headache    1 mth    HPI Ashley [name] reports that she has a significant history of headaches and migraines.  Reports that they have been worse recently, is having approximately 2-3 headaches a week with approximately every second or third one she considers them to be a migraine.  Reports that her headaches will be either left-sided or right-sided, but does endorse that sometimes they can be frontal.  Reports that when she feels she is having a migraine she will also have nausea.  Reports that she will take ibuprofen 800 mg, states that she generally has take 2 of those to get any relief, states that she rarely takes 3 at one time.  Has not identified a trigger.  Reports recent eye exam, states that she drinks approximately 1 L of water a day.  Reports that she is concerned about her weight, states that she has never weighed over 300 pounds, states that she had been trying to go to the gym and eat better without any weight loss.  Reports no previous diagnosis of hypertension  Past Medical History:  Diagnosis Date  . GERD (gastroesophageal reflux disease) 2012   diet controlled - no meds  . Headache(784.0)    3X WEEK  . 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 Irregular periods/menstrual cycles 01/30/2012  . Late prenatal care 01/30/2012  . Poor social situation 01/30/2012    Past Surgical History:  Procedure Laterality Date  . CESAREAN SECTION  2009  . CESAREAN SECTION  01/30/2012   Procedure: CESAREAN SECTION;  Surgeon: Michael Litter, MD;  Location: [facility];  Service: Gynecology;  Laterality: N/A;  Repeat C/S  . CESAREAN SECTION WITH BILATERAL TUBAL LIGATION Bilateral 08/13/2016   Procedure: CESAREAN SECTION WITH BILATERAL TUBAL LIGATION;  Surgeon: Lesly Dukes, MD;  Location: [facility];  Service:  Obstetrics;  Laterality: Bilateral;  . CHOLECYSTECTOMY N/A 10/16/2013   Procedure: LAPAROSCOPIC CHOLECYSTECTOMY WITH INTRAOPERATIVE CHOLANGIOGRAM;  Surgeon: Wilmon Arms. Corliss Skains, MD;  Location: [facility];  Service: General;  Laterality: N/A;  . DILATION AND EVACUATION N/A 07/28/2015   Procedure: SUCTION, DILATATION AND EVACUATION;  Surgeon: Brock Bad, MD;  Location: [facility];  Service: Gynecology;  Laterality: N/A;  . TONSILLECTOMY  AGE 71  . TONSILLECTOMY AND ADENOIDECTOMY  AGEB 18    Family History  Problem Relation Age of Onset  . Asthma Mother   . Hypertension Mother   . Early death Mother        AGE 67  . Cancer Maternal Grandmother     Social History   Socioeconomic History  . Marital status: Single    Spouse name: Not on file  . Number of children: 1  . Years of education: 33  . Highest education level: Not on file  Occupational History  . Not on file  Tobacco Use  . Smoking status: Former Smoker    Packs/day: 0.00    Years: 6.00    Pack years: 0.00    Types: Cigarettes    Quit date: 11/01/2015    Years since quitting: 4.1  . Smokeless tobacco: Never Used  Substance and Sexual Activity  . Alcohol use: No    Alcohol/week: 0.0 standard drinks  . Drug use: No  . Sexual activity: Yes  Partners: Male    Birth control/protection: None, Surgical    Comment: BTL 08/2016  Other Topics Concern  . Not on file  Social History Narrative  . Not on file   Social Determinants of Health   Financial Resource Strain:   . Difficulty of Paying Living Expenses:   Food Insecurity:   . Worried About Programme researcher, broadcasting/film/video in the Last Year:   . Barista in the Last Year:   Transportation Needs:   . Freight forwarder (Medical):   Marland Kitchen Lack of Transportation (Non-Medical):   Physical Activity:   . Days of Exercise per Week:   . Minutes of Exercise per Session:   Stress:   . Feeling of Stress :   Social Connections:   . Frequency of Communication with Friends and Family:    . Frequency of Social Gatherings with Friends and Family:   . Attends Religious Services:   . Active Member of Clubs or Organizations:   . Attends Banker Meetings:   Marland Kitchen Marital Status:   Intimate Partner Violence:   . Fear of Current or Ex-Partner:   . Emotionally Abused:   Marland Kitchen Physically Abused:   . Sexually Abused:     ROS Review of Systems  Constitutional: Negative.   HENT: Negative.   Eyes: Negative.   Respiratory: Negative.   Cardiovascular: Negative.   Gastrointestinal: Negative.   Endocrine: Negative.   Genitourinary: Negative.   Musculoskeletal: Negative.   Allergic/Immunologic: Negative.   Neurological: Negative.   Hematological: Negative.   Psychiatric/Behavioral: Negative.     Objective:   Today's Vitals: BP 137/81 (BP Location: Left Arm, Patient Position: Sitting, Cuff Size: Large)   Pulse 86   Temp 98.7 F (37.1 C) (Oral)   Resp 18   Ht 5\' 3"  (1.6 m)   Wt (!) 315 lb (142.9 kg)   LMP 12/16/2019   SpO2 95%   BMI 55.80 kg/m   Physical Exam Vitals and nursing note reviewed.  Constitutional:      General: She is not in acute distress.    Appearance: She is well-developed. She is obese. She is not ill-appearing.  HENT:     Head: Normocephalic.     Mouth/Throat:     Mouth: Mucous membranes are moist.     Pharynx: Oropharynx is clear.  Eyes:     Extraocular Movements: Extraocular movements intact.     Right eye: Normal extraocular motion and no nystagmus.     Left eye: Normal extraocular motion and no nystagmus.     Pupils: Pupils are equal, round, and reactive to light.  Cardiovascular:     Rate and Rhythm: Normal rate and regular rhythm.     Heart sounds: Normal heart sounds.  Pulmonary:     Effort: Pulmonary effort is normal.     Breath sounds: Normal breath sounds.  Abdominal:     General: Bowel sounds are normal.     Palpations: Abdomen is soft.  Musculoskeletal:        General: Normal range of motion.     Cervical back:  Normal range of motion and neck supple.  Skin:    General: Skin is warm and dry.  Neurological:     Mental Status: She is alert and oriented to person, place, and time.  Psychiatric:        Mood and Affect: Mood normal.        Speech: Speech normal.        Behavior:  Behavior normal.     Assessment & Plan:   Problem List Items Addressed This Visit    None    Visit Diagnoses    Intractable migraine without aura and without status migrainosus    -  Primary   Relevant Medications   SUMAtriptan (IMITREX) 50 MG tablet   ibuprofen (ADVIL) 800 MG tablet   Morbid obesity (HCC)          Outpatient Encounter Medications as of 12/17/2019  Medication Sig  . ibuprofen (ADVIL) 800 MG tablet Take 1 tablet (800 mg total) by mouth every 8 (eight) hours as needed.  . SUMAtriptan (IMITREX) 50 MG tablet Take 1 tablet (50 mg total) by mouth every 2 (two) hours as needed for migraine. May repeat in 2 hours if headache persists or recurs.  . [DISCONTINUED] azithromycin (ZITHROMAX) 250 MG tablet Take 1 tablet (250 mg total) by mouth daily. Take first 2 tablets together, then 1 every day until finished.  . [DISCONTINUED] Cetirizine HCl 10 MG CAPS Take 1 capsule (10 mg total) by mouth daily for 10 days.  . [DISCONTINUED] Prenat-Fe Poly-Methfol-FA-DHA (VITAFOL ULTRA) 29-0.6-0.4-200 MG CAPS Take 1 capsule by mouth daily before breakfast. (Patient not taking: Reported on 12/17/2019)   No facility-administered encounter medications on file as of 12/17/2019.  1. Intractable migraine without aura and without status migrainosus Gave patient education on proper use of ibuprofen, encouraged patient to keep headache diary, increase hydration, vitamin D over-the-counter.  Patient to return to mobile unit in 1 week for fasting labs - SUMAtriptan (IMITREX) 50 MG tablet; Take 1 tablet (50 mg total) by mouth every 2 (two) hours as needed for migraine. May repeat in 2 hours if headache persists or recurs.  Dispense: 10  tablet; Refill: 0 - ibuprofen (ADVIL) 800 MG tablet; Take 1 tablet (800 mg total) by mouth every 8 (eight) hours as needed.  Dispense: 30 tablet; Refill: 0  2. Morbid obesity ([place])  3. Nutritional counseling Gave patient education on mindful eating, increasing hydration   I have reviewed the patient's medical history (PMH, PSH, Social History, Family History, Medications, and allergies) , and have been updated if relevant. I spent 30 minutes reviewing chart and  face to face time with patient.      Follow-up: Return in about 1 week (around 12/24/2019) for Fasting  labs.   Loraine Grip Mayers, PA-C

## 2019-12-24 ENCOUNTER — Other Ambulatory Visit: Payer: Medicaid Other

## 2020-07-06 ENCOUNTER — Ambulatory Visit (HOSPITAL_COMMUNITY)
Admission: EM | Admit: 2020-07-06 | Discharge: 2020-07-06 | Disposition: A | Discharge status: Home / Self Care | Payer: Medicaid Other | Attending: Urgent Care | Admitting: Urgent Care

## 2020-07-06 ENCOUNTER — Encounter (HOSPITAL_COMMUNITY): Payer: Self-pay

## 2020-07-06 DIAGNOSIS — R519 Headache, unspecified: Secondary | ICD-10-CM

## 2020-07-06 DIAGNOSIS — R52 Pain, unspecified: Secondary | ICD-10-CM

## 2020-07-06 DIAGNOSIS — R059 Cough, unspecified: Secondary | ICD-10-CM

## 2020-07-06 DIAGNOSIS — J111 Influenza due to unidentified influenza virus with other respiratory manifestations: Secondary | ICD-10-CM

## 2020-07-06 DIAGNOSIS — Z87891 Personal history of nicotine dependence: Secondary | ICD-10-CM | POA: Diagnosis not present

## 2020-07-06 DIAGNOSIS — U071 COVID-19: Secondary | ICD-10-CM | POA: Diagnosis not present

## 2020-07-06 LAB — RESP PANEL BY RT-PCR (FLU A&B, COVID) ARPGX2
Influenza A by PCR: NEGATIVE
Influenza B by PCR: NEGATIVE
SARS Coronavirus 2 by RT PCR: POSITIVE — AB

## 2020-07-06 MED ORDER — PSEUDOEPHEDRINE HCL 60 MG PO TABS: 60.0000 mg | ORAL_TABLET | Freq: Three times a day (TID) | ORAL | 0 refills | Status: DC | PRN

## 2020-07-06 MED ORDER — OSELTAMIVIR PHOSPHATE 75 MG PO CAPS: 75.0000 mg | ORAL_CAPSULE | Freq: Two times a day (BID) | ORAL | 0 refills | Status: DC

## 2020-07-06 MED ORDER — PROMETHAZINE-DM 6.25-15 MG/5ML PO SYRP: 5.0000 mL | ORAL_SOLUTION | Freq: Every evening | ORAL | 0 refills | Status: DC | PRN

## 2020-07-06 MED ORDER — CETIRIZINE HCL 10 MG PO TABS: 10.0000 mg | ORAL_TABLET | Freq: Every day | ORAL | 0 refills | Status: DC

## 2020-07-06 MED ORDER — BENZONATATE 100 MG PO CAPS: 100.0000 mg | ORAL_CAPSULE | Freq: Three times a day (TID) | ORAL | 0 refills | Status: DC | PRN

## 2020-07-06 NOTE — ED Provider Notes (Signed)
[facility]   MRN: [id number] DOB: 03/06/1986  Subjective:   Ashley [name] is a 35 y.o. female presenting for 1 day history of cute onset dry cough, fatigue, body aches.  Patient has felt chills as well.  Has used BC powder with some relief of her symptoms.  Denies fever, loss of sense of taste and smell, chest pain, shortness of breath.  Patient is COVID vaccinated but not flu vaccinated.  Denies history of respiratory disorders.  Patient is not smoking.  No current facility-administered medications for this encounter.  Current Outpatient Medications:  .  ibuprofen (ADVIL) 800 MG tablet, Take 1 tablet (800 mg total) by mouth every 8 (eight) hours as needed., Disp: 30 tablet, Rfl: 0 .  SUMAtriptan (IMITREX) 50 MG tablet, Take 1 tablet (50 mg total) by mouth every 2 (two) hours as needed for migraine. May repeat in 2 hours if headache persists or recurs., Disp: 10 tablet, Rfl: 0   Allergies  Allergen Reactions  . Fruit Extracts Itching    Fresh Fruit     Past Medical History:  Diagnosis Date  . GERD (gastroesophageal reflux disease) 2012   diet controlled - no meds  . Headache(784.0)    3X WEEK  . 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 Irregular periods/menstrual cycles 01/30/2012  . Late prenatal care 01/30/2012  . Poor social situation 01/30/2012  . Previous cesarean section complicating pregnancy, antepartum condition or complication 01/27/2016   2 previous cesarean section- Desires repeat with BTL  . Single liveborn, born in hospital, delivered by cesarean delivery 08/13/2016  . Status post repeat low transverse cesarean section 08/14/2016  . Status post tubal ligation at time of delivery, current hosp 08/14/2016     Past Surgical History:  Procedure Laterality Date  . CESAREAN SECTION  2009  . CESAREAN SECTION  01/30/2012   Procedure: CESAREAN SECTION;  Surgeon: Michael Litter, MD;  Location: [facility];  Service: Gynecology;  Laterality: N/A;  Repeat C/S  .  CESAREAN SECTION WITH BILATERAL TUBAL LIGATION Bilateral 08/13/2016   Procedure: CESAREAN SECTION WITH BILATERAL TUBAL LIGATION;  Surgeon: Lesly Dukes, MD;  Location: [facility]TH[facility] SU[facility];  Service: Obstetrics;  Laterality: Bilateral;  . CHOLECYSTECTOMY N/A 10/16/2013   Procedure: LAPAROSCOPIC CHOLECYSTECTOMY WITH INTRAOPERATIVE CHOLANGIOGRAM;  Surgeon: Wilmon Arms. Corliss Skains, MD;  Location: [facility] OR;  Service: General;  Laterality: N/A;  . DILATION AND EVACUATION N/A 07/28/2015   Procedure: SUCTION, DILATATION AND EVACUATION;  Surgeon: Brock Bad, MD;  Location: [facility];  Service: Gynecology;  Laterality: N/A;  . TONSILLECTOMY  AGE 89  . TONSILLECTOMY AND ADENOIDECTOMY  AGEB 18    Family History  Problem Relation Age of Onset  . Asthma Mother   . Hypertension Mother   . Early death Mother        AGE 21  . Cancer Maternal Grandmother     Social History   Tobacco Use  . Smoking status: Former Smoker    Packs/day: 0.00    Years: 6.00    Pack years: 0.00    Types: Cigarettes    Quit date: 11/01/2015    Years since quitting: 4.6  . Smokeless tobacco: Never Used  Substance Use Topics  . Alcohol use: No    Alcohol/week: 0.0 standard drinks  . Drug use: No    ROS   Objective:   Vitals: BP 137/79 (BP Location: Left Arm)   Pulse 94   Temp 98.9 F (37.2 C) (Oral)  Resp (!) 23   LMP 06/11/2020 (Approximate)   SpO2 96%   Physical Exam Constitutional:      General: She is not in acute distress.    Appearance: Normal appearance. She is well-developed. She is not ill-appearing, toxic-appearing or diaphoretic.  HENT:     Head: Normocephalic and atraumatic.     Nose: Nose normal.     Mouth/Throat:     Mouth: Mucous membranes are moist.  Eyes:     Extraocular Movements: Extraocular movements intact.     Pupils: Pupils are equal, round, and reactive to light.  Cardiovascular:     Rate and Rhythm: Normal rate and regular rhythm.     Pulses: Normal pulses.     Heart sounds:  Normal heart sounds. No murmur heard. No friction rub. No gallop.   Pulmonary:     Effort: Pulmonary effort is normal. No respiratory distress.     Breath sounds: Normal breath sounds. No stridor. No wheezing, rhonchi or rales.  Skin:    General: Skin is warm and dry.     Findings: No rash.  Neurological:     Mental Status: She is alert and oriented to person, place, and time.  Psychiatric:        Mood and Affect: Mood normal.        Behavior: Behavior normal.        Thought Content: Thought content normal.        Judgment: Judgment normal.      Assessment and Plan :   PDMP not reviewed this encounter.  1. Influenza-like illness   2. Cough   3. Body aches   4. Generalized headache     We will manage influenza-like illness with Tamiflu.  Labs pending.  Recommend supportive care. Counseled patient on potential for adverse effects with medications prescribed/recommended today, ER and return-to-clinic precautions discussed, patient verbalized understanding.    Wallis Bamberg, PA-C 07/06/20 1148

## 2020-07-06 NOTE — ED Triage Notes (Signed)
Pt in with c/o dry cough, fatigue, body aches that started last night. Also c/o chills   pt took BC powder to help with sxs

## 2020-07-06 NOTE — Discharge Instructions (Addendum)
We will notify you of your COVID-19 test results as they arrive and may take between 24 to 48 hours.  I encourage you to sign up for MyChart if you have not already done so as this can be the easiest way for [place] to communicate results to you online or through a phone app.  In the meantime, if you develop worsening symptoms including fever, chest pain, shortness of breath despite our current treatment plan then please report to the emergency room as this may be a sign of worsening status from possible COVID-19 infection.  Otherwise, we will manage this as a viral syndrome like the flu with Tamiflu. For sore throat or cough try using a honey-based tea. Use 3 teaspoons of honey with juice squeezed from half lemon. Place shaved pieces of ginger into 1/2-1 cup of water and warm over stove top. Then mix the ingredients and repeat every 4 hours as needed. Please take Tylenol 500mg -650mg  every 6 hours for aches and pains, fevers. Hydrate very well with at least 2 liters of water. Eat light meals such as soups to replenish electrolytes and soft fruits, veggies. Start an antihistamine like Zyrtec, Allegra or Claritin for postnasal drainage, sinus congestion.  You can take this together with pseudoephedrine (Sudafed) at a dose of 60 mg 2-3 times a day as needed for the same kind of congestion.

## 2020-11-12 ENCOUNTER — Other Ambulatory Visit: Payer: Self-pay | Admitting: Family Medicine

## 2020-11-12 ENCOUNTER — Other Ambulatory Visit: Payer: Self-pay

## 2020-11-12 ENCOUNTER — Ambulatory Visit
Admission: RE | Admit: 2020-11-12 | Discharge: 2020-11-12 | Disposition: A | Discharge status: Home / Self Care | Payer: Medicaid Other | Source: Ambulatory Visit | Attending: Family Medicine | Admitting: Family Medicine

## 2020-11-12 DIAGNOSIS — W19XXXA Unspecified fall, initial encounter: Secondary | ICD-10-CM

## 2020-12-14 ENCOUNTER — Other Ambulatory Visit (HOSPITAL_BASED_OUTPATIENT_CLINIC_OR_DEPARTMENT_OTHER): Payer: Self-pay

## 2020-12-14 DIAGNOSIS — R5383 Other fatigue: Secondary | ICD-10-CM

## 2020-12-14 DIAGNOSIS — R0681 Apnea, not elsewhere classified: Secondary | ICD-10-CM

## 2020-12-14 DIAGNOSIS — G4710 Hypersomnia, unspecified: Secondary | ICD-10-CM

## 2020-12-14 DIAGNOSIS — R0683 Snoring: Secondary | ICD-10-CM

## 2021-01-22 ENCOUNTER — Ambulatory Visit (HOSPITAL_BASED_OUTPATIENT_CLINIC_OR_DEPARTMENT_OTHER): Payer: Medicaid Other | Admitting: Internal Medicine

## 2021-01-22 ENCOUNTER — Other Ambulatory Visit: Payer: Self-pay

## 2021-01-22 DIAGNOSIS — R0683 Snoring: Secondary | ICD-10-CM

## 2021-01-22 DIAGNOSIS — R0681 Apnea, not elsewhere classified: Secondary | ICD-10-CM

## 2021-01-22 DIAGNOSIS — R5383 Other fatigue: Secondary | ICD-10-CM

## 2021-01-22 DIAGNOSIS — G4710 Hypersomnia, unspecified: Secondary | ICD-10-CM

## 2021-02-17 ENCOUNTER — Ambulatory Visit (HOSPITAL_BASED_OUTPATIENT_CLINIC_OR_DEPARTMENT_OTHER): Payer: Medicaid Other | Attending: Family Medicine | Admitting: Internal Medicine

## 2021-02-17 ENCOUNTER — Other Ambulatory Visit: Payer: Self-pay

## 2021-02-17 VITALS — Ht 63.0 in | Wt 240.0 lb

## 2021-02-17 DIAGNOSIS — R0681 Apnea, not elsewhere classified: Secondary | ICD-10-CM

## 2021-02-17 DIAGNOSIS — R5383 Other fatigue: Secondary | ICD-10-CM

## 2021-02-17 DIAGNOSIS — R0683 Snoring: Secondary | ICD-10-CM

## 2021-02-17 DIAGNOSIS — G4733 Obstructive sleep apnea (adult) (pediatric): Secondary | ICD-10-CM

## 2021-02-17 DIAGNOSIS — G4710 Hypersomnia, unspecified: Secondary | ICD-10-CM

## 2021-02-27 DIAGNOSIS — G4733 Obstructive sleep apnea (adult) (pediatric): Secondary | ICD-10-CM

## 2021-02-27 NOTE — Progress Notes (Signed)
Study cancelled and rescheduled.

## 2021-02-27 NOTE — Procedures (Deleted)
    NAME: Ashley [name] DATE OF BIRTH:  [birth year] MEDICAL RECORD NUMBER [id number]  LOCATION: [facility]  PHYSICIAN: Malonie Tatum  DATE OF STUDY: 02/17/2021  SLEEP STUDY TYPE: Out of Center Sleep Test                REFERRING PHYSICIAN: Leilani Able, MD  INDICATION FOR STUDY: ***  EPWORTH SLEEPINESS SCORE:   HEIGHT: 5\' 3"  (160 cm)  WEIGHT: 240 lb (108.9 kg)    Body mass index is 42.51 kg/m.  NECK SIZE: 17 in.  MEDICATIONS: ***  IMPRESSION:  ***    RECOMMENDATION:  ***   Tareka Jhaveri Diplomate, American Board of Sleep Medicine  ELECTRONICALLY SIGNED ON:  02/27/2021, 11:49 AM [facility] PH: [phone]   FX: [phone] ACCREDITED BY THE AMERICAN ACADEMY OF SLEEP MEDICINE

## 2021-02-27 NOTE — Progress Notes (Signed)
    Patient Name: Ashley [name], Ashley [name] Date: 02/18/2021 Gender: Female D.O.B: 10-Aug-1985 Age (years): 35 Referring Provider: Leilani Able Height (inches): 63 Interpreting Physician: Jetty Duhamel MD, ABSM Weight (lbs): 240 RPSGT: Neeriemer, Holly BMI: 43 MRN: [id number] Neck Size: 18.00  CLINICAL INFORMATION Sleep Study Type: HST Indication for sleep study: N/A [place] Epworth Sleepiness Score: N/A  SLEEP STUDY TECHNIQUE A multi-channel overnight portable sleep study was performed. The channels recorded were: nasal airflow, thoracic respiratory movement, and oxygen saturation with a pulse oximetry. Snoring was also monitored.  MEDICATIONS Patient self administered medications include: none reported.  SLEEP ARCHITECTURE Patient was studied for 358.9 minutes. The sleep efficiency was 100.0 % and the patient was supine for 90%. The arousal index was 0.0 per hour.  RESPIRATORY PARAMETERS The overall AHI was 101.8 per hour, with a central apnea index of 0 per hour. The oxygen nadir was 42% during sleep.  CARDIAC DATA Mean heart rate during sleep was 96.7 bpm.  IMPRESSIONS - Severe obstructive sleep apnea occurred during this study (AHI = 101.8/h). - Severe oxygen desaturation was noted during this study (Min O2 = 42%).Mean O2 saturation 77%, indicating Noctural Hypoxemia. - Patient snored.  DIAGNOSIS - Obstructive Sleep Apnea (G47.33) - Nocturnal Hypoxemia (G47.36)  RECOMMENDATIONS  - Suggest CPAP titration sleep study in-lab. This would document for insurance if supplemental O2 is also required. Other wise, consider autopap, adding overnight oximetry on CPAP to determine need for O2  - Be careful with alcohol, sedatives and other CNS depressants that may worsen sleep apnea and disrupt normal sleep architecture. - Sleep hygiene should be reviewed to assess factors that may improve sleep quality. - Weight management and regular exercise should be initiated or  continued.  [Electronically signed] 02/27/2021 11:43 AM  Jetty Duhamel MD, ABSM Diplomate, American Board of Sleep Medicine   NPI: [id number]                         Jetty Duhamel Diplomate, American Board of Sleep Medicine  ELECTRONICALLY SIGNED ON:  02/27/2021, 11:39 AM [facility] PH: [phone]   FX: [phone] ACCREDITED BY THE AMERICAN ACADEMY OF SLEEP MEDICINE

## 2021-03-29 DIAGNOSIS — Z20828 Contact with and (suspected) exposure to other viral communicable diseases: Secondary | ICD-10-CM | POA: Diagnosis not present

## 2021-04-23 ENCOUNTER — Encounter: Payer: Self-pay | Admitting: Pulmonary Disease

## 2021-04-23 ENCOUNTER — Ambulatory Visit (INDEPENDENT_AMBULATORY_CARE_PROVIDER_SITE_OTHER): Payer: Medicaid Other | Admitting: Pulmonary Disease

## 2021-04-23 ENCOUNTER — Other Ambulatory Visit: Payer: Self-pay

## 2021-04-23 VITALS — BP 110/70 | HR 100 | Temp 98.5°F | Ht 63.0 in | Wt 334.0 lb

## 2021-04-23 DIAGNOSIS — E662 Morbid (severe) obesity with alveolar hypoventilation: Secondary | ICD-10-CM

## 2021-04-23 DIAGNOSIS — G4733 Obstructive sleep apnea (adult) (pediatric): Secondary | ICD-10-CM

## 2021-04-23 NOTE — Progress Notes (Signed)
[facility]  Chief Complaint  Patient presents with   Consult    Sleep study 8.31.22    Past Surgical History:  She  has a past surgical history that includes Cesarean section (2009); Tonsillectomy (AGE 35); Tonsillectomy and adenoidectomy (AGEB 18); Cesarean section (01/30/2012); Cholecystectomy (N/A, 10/16/2013); Dilation and evacuation (N/A, 07/28/2015); and Cesarean section with bilateral tubal ligation (Bilateral, 08/13/2016).  Past Medical History:  GERD, HA  Constitutional:  BP 110/70 (BP Location: Right Arm, Cuff Size: Large)   Pulse 100   Temp 98.5 F (36.9 C) (Oral)   Ht 5\' 3"  (1.6 m)   Wt (!) 334 lb (151.5 kg)   SpO2 95%   BMI 59.17 kg/m   Brief Summary:  Ashley [name] is a 35 y.o. female with obstructive sleep apnea.      Subjective:   She has snoring and apnea.  She has trouble staying awake when sitting quiet.  She gets leg cramps.  She punches and kicks at night.  Her sleep study from August showed very severe sleep apnea.  She goes to sleep at 10 pm.  She falls asleep in seconds.  She wakes up several times to use the bathroom.  She gets out of bed at 6 am.  She feels tired in the morning.  She denies morning headache.  She does not use anything to help her fall sleep or stay awake.  She denies sleep walking, sleep talking, bruxism, or nightmares.  There is no history of restless legs.  She denies sleep hallucinations, sleep paralysis, or cataplexy.  The Epworth score is 18 out of 24.   Physical Exam:   Appearance - well kempt   ENMT - no sinus tenderness, no oral exudate, no LAN, Mallampati 4 airway, no stridor  Respiratory - equal breath sounds bilaterally, no wheezing or rales  CV - s1s2 regular rate and rhythm, no murmurs  Ext - no clubbing, no edema  Skin - no rashes  Psych - normal mood and affect   Sleep Tests:  PSG 02/17/21 >> AHI 101.8, SpO2 low 42%  Social History:  She  reports that  she has been smoking cigarettes. She has never used smokeless tobacco. She reports that she does not drink alcohol and does not use drugs.  Family History:  Her family history includes Asthma in her mother; Cancer in her maternal grandmother; Early death in her mother; Hypertension in her mother.     Assessment/Plan:   Obstructive sleep apnea with obesity hypoventilation syndrome. - will arrange for CPAP titration study and then determine if CPAP is sufficient or if she needs supplemental oxygen at night or Bipap  Obesity. - discussed how weight can impact sleep and risk for sleep disordered breathing - discussed options to assist with weight loss: combination of diet modification, cardiovascular and strength training exercises  Cardiovascular risk. - had an extensive discussion regarding the adverse health consequences related to untreated sleep disordered breathing - specifically discussed the risks for hypertension, coronary artery disease, cardiac dysrhythmias, cerebrovascular disease, and diabetes - lifestyle modification discussed  Safe driving practices. - discussed how sleep disruption can increase risk of accidents, particularly when driving - safe driving practices were discussed  Therapies for obstructive sleep apnea. - if the sleep study shows significant sleep apnea, then various therapies for treatment were reviewed: CPAP, oral appliance, and surgical interventions   Time Spent Involved in Patient Care on Day of Examination:  32 minutes  Follow up:  Patient Instructions  Will arrange for CPAP titration study  Follow up in 6 months  Medication List:   Allergies as of 04/23/2021       Reactions   Fruit Extracts Itching   Fresh Fruit         Medication List        Accurate as of April 23, 2021 12:24 PM. If you have any questions, ask your nurse or doctor.          STOP taking these medications    benzonatate 100 MG capsule Commonly known  as: TESSALON Stopped by: Coralyn Helling, MD   cetirizine 10 MG tablet Commonly known as: ZyrTEC Allergy Stopped by: Coralyn Helling, MD   oseltamivir 75 MG capsule Commonly known as: TAMIFLU Stopped by: Coralyn Helling, MD   promethazine-dextromethorphan 6.25-15 MG/5ML syrup Commonly known as: PROMETHAZINE-DM Stopped by: Coralyn Helling, MD   pseudoephedrine 60 MG tablet Commonly known as: SUDAFED Stopped by: Coralyn Helling, MD   SUMAtriptan 50 MG tablet Commonly known as: Imitrex Stopped by: Coralyn Helling, MD       TAKE these medications    amLODipine 10 MG tablet Commonly known as: NORVASC Take 10 mg by mouth daily. What changed: Another medication with the same name was removed. Continue taking this medication, and follow the directions you see here. Changed by: Coralyn Helling, MD   hydrochlorothiazide 25 MG tablet Commonly known as: HYDRODIURIL Take 25 mg by mouth daily. What changed: Another medication with the same name was removed. Continue taking this medication, and follow the directions you see here. Changed by: Coralyn Helling, MD   ibuprofen 800 MG tablet Commonly known as: ADVIL Take 1 tablet (800 mg total) by mouth every 8 (eight) hours as needed.        Signature:  Coralyn Helling, MD [facility]monary/Critical Care Pager - [phone] 04/23/2021, 12:24 PM

## 2021-04-23 NOTE — Patient Instructions (Signed)
Will arrange for CPAP titration study  Follow up in 6 months 

## 2021-05-09 ENCOUNTER — Other Ambulatory Visit: Payer: Self-pay

## 2021-05-09 ENCOUNTER — Ambulatory Visit (HOSPITAL_BASED_OUTPATIENT_CLINIC_OR_DEPARTMENT_OTHER): Payer: Medicaid Other | Attending: Pulmonary Disease | Admitting: Pulmonary Disease

## 2021-05-09 DIAGNOSIS — G4736 Sleep related hypoventilation in conditions classified elsewhere: Secondary | ICD-10-CM | POA: Diagnosis not present

## 2021-05-09 DIAGNOSIS — E662 Morbid (severe) obesity with alveolar hypoventilation: Secondary | ICD-10-CM | POA: Diagnosis present

## 2021-05-09 DIAGNOSIS — G4733 Obstructive sleep apnea (adult) (pediatric): Secondary | ICD-10-CM | POA: Insufficient documentation

## 2021-05-14 ENCOUNTER — Telehealth: Payer: Self-pay | Admitting: Pulmonary Disease

## 2021-05-14 DIAGNOSIS — E662 Morbid (severe) obesity with alveolar hypoventilation: Secondary | ICD-10-CM

## 2021-05-14 DIAGNOSIS — J9611 Chronic respiratory failure with hypoxia: Secondary | ICD-10-CM

## 2021-05-14 DIAGNOSIS — G4733 Obstructive sleep apnea (adult) (pediatric): Secondary | ICD-10-CM

## 2021-05-14 NOTE — Telephone Encounter (Signed)
Called and spoke with pt who is requesting to know the results of recent sleep study. Dr. Craige Cotta, please advise.

## 2021-05-17 DIAGNOSIS — G4733 Obstructive sleep apnea (adult) (pediatric): Secondary | ICD-10-CM | POA: Diagnosis not present

## 2021-05-17 NOTE — Procedures (Signed)
     Patient Name: Ashley [name], Ashley [name] Date: 05/09/2021 Gender: Female D.O.B: Nov 07, 1985 Age (years): 35 Referring Provider: Coralyn Helling MD, ABSM Height (inches): 63 Interpreting Physician: Coralyn Helling MD, ABSM Weight (lbs): 334 RPSGT: [place]ug[place], [place] BMI: 59 MRN: [id number] Neck Size: 17.00  CLINICAL INFORMATION The patient is referred for a BiPAP titration to treat sleep apnea.  Date of NPSG 02/17/21:  AHI 101.8, SpO2 low 42%.  SLEEP STUDY TECHNIQUE As per the AASM Manual for the Scoring of Sleep and Associated Events v2.3 (April 2016) with a hypopnea requiring 4% desaturations.  The channels recorded and monitored were frontal, central and occipital EEG, electrooculogram (EOG), submentalis EMG (chin), nasal and oral airflow, thoracic and abdominal wall motion, anterior tibialis EMG, snore microphone, electrocardiogram, and pulse oximetry. Bilevel positive airway pressure (BPAP) was initiated at the beginning of the study and titrated to treat sleep-disordered breathing.  MEDICATIONS Medications self-administered by patient taken the night of the study : N/A  RESPIRATORY PARAMETERS Optimal IPAP Pressure (cm): 26 AHI at Optimal Pressure (/hr) 3.1 Optimal EPAP Pressure (cm): 20   Overall Minimal O2 (%): 52.00 Minimal O2 at Optimal Pressure (%): 83.0  She was started on CPAP, but had continue obstructive respiratory events.  She was transitioned to Bipap.  She did best with Bipap 26/20 cm H2O rise time 300, ti max 1.5, ti min ise time300, ti max 1.5, ti min 0.8, rise 300, triggermedium,cycle medium0.8, trigger medium, cycle medium.  She continued to have oxygen desaturation in the abscence of other respiratory events lasting longer than 5 minutes.  This improved after she had 3 liters oxygen added in with BiPAP.  SLEEP ARCHITECTURE Start Time: 11:04:46 PM Stop Time: 5:06:36 AM Total Time (min): 361.8 Total Sleep Time (min): 318.5 Sleep Latency (min): 0.6 Sleep Efficiency  (%): 88.0 REM Latency (min): 78.5 WASO (min): 42.7 Stage N1 (%): 10.36 Stage N2 (%): 68.13 Stage N3 (%): 0.63 Stage R (%): 20.9 Supine (%): 72.55 Arousal Index (/hr): 9.2   CARDIAC DATA The 2 lead EKG demonstrated sinus rhythm. The mean heart rate was 101.53 beats per minute. Other EKG findings include: None.  LEG MOVEMENT DATA The total Periodic Limb Movements of Sleep (PLMS) were 113. The PLMS index was 21.29. A PLMS index of <15 is considered normal in adults.  IMPRESSIONS - CPAP failed to controle her sleep apnea. - She did best with Bipap 26/20 cm H2O. - She required 3 liters supplemental oxygen added in with Bipap.  DIAGNOSIS - Obstructive Sleep Apnea  - Nocturnal Hypoxemia  RECOMMENDATIONS - Trial of BiPAP therapy on 26/20 cm H2O with 3 liters supplemental oxygen. - She was fitted with a Medium size [employer] Full Face Mask Simplus mask and heated humidification. - Avoid alcohol, sedatives and other CNS depressants that may worsen sleep apnea and disrupt normal sleep architecture. - Sleep hygiene should be reviewed to assess factors that may improve sleep quality. - Weight management and regular exercise should be initiated or continued.  [Electronically signed] 05/17/2021 09:53 AM  Coralyn Helling MD, ABSM Diplomate, American Board of Sleep Medicine   NPI: [id number] [facility] PH: [phone]   FX: [phone] ACCREDITED BY THE AMERICAN ACADEMY OF SLEEP MEDICINE

## 2021-05-17 NOTE — Telephone Encounter (Signed)
Called and spoke with patient. She is aware of her results and the fact VS has tried to expedite her order. She verbalized understanding.   Nothing further needed at time of call.

## 2021-05-17 NOTE — Telephone Encounter (Signed)
Bipap titration 05/09/21 >> Bipap 26/20 cm H2O with rise time 300, ti max 1.5, ti min 0.8, trigger medium,cycle medium and 3 liters oxygen.   Please let her know that she did well with Bipap and supplemental oxygen during her sleep study.  Please let her know I have sent an order to get her set up with DME to arrange for Bipap and 3 liters oxygen at night, and I have asked [facility] to expedite set up.

## 2021-05-20 ENCOUNTER — Telehealth: Payer: Self-pay | Admitting: Pulmonary Disease

## 2021-05-20 NOTE — Telephone Encounter (Signed)
Can change to 25/20 cm H2O.

## 2021-05-20 NOTE — Telephone Encounter (Signed)
Call made to [facility], made aware of VS recommendations. Voiced understanding.   Nothing further needed at this time.

## 2021-05-20 NOTE — Telephone Encounter (Signed)
I have called and spoke with Cyndra Numbers and he stated that the resmed home machines will not go higher than 25.   The order was sent in for 26/20.  Cyndra Numbers wanted to call and see what VS wanted to change the settings to.  VS please advise. We can call back to give a VO.

## 2021-05-24 DIAGNOSIS — G4733 Obstructive sleep apnea (adult) (pediatric): Secondary | ICD-10-CM | POA: Diagnosis not present

## 2021-05-31 DIAGNOSIS — G4730 Sleep apnea, unspecified: Secondary | ICD-10-CM | POA: Diagnosis not present

## 2021-05-31 DIAGNOSIS — R5383 Other fatigue: Secondary | ICD-10-CM | POA: Diagnosis not present

## 2021-05-31 DIAGNOSIS — R059 Cough, unspecified: Secondary | ICD-10-CM | POA: Diagnosis not present

## 2021-05-31 DIAGNOSIS — I10 Essential (primary) hypertension: Secondary | ICD-10-CM | POA: Diagnosis not present

## 2021-05-31 DIAGNOSIS — R062 Wheezing: Secondary | ICD-10-CM | POA: Diagnosis not present

## 2021-07-04 DIAGNOSIS — G4733 Obstructive sleep apnea (adult) (pediatric): Secondary | ICD-10-CM | POA: Diagnosis not present

## 2021-08-04 DIAGNOSIS — G4733 Obstructive sleep apnea (adult) (pediatric): Secondary | ICD-10-CM | POA: Diagnosis not present

## 2021-09-01 DIAGNOSIS — G4733 Obstructive sleep apnea (adult) (pediatric): Secondary | ICD-10-CM | POA: Diagnosis not present

## 2021-09-06 DIAGNOSIS — G4733 Obstructive sleep apnea (adult) (pediatric): Secondary | ICD-10-CM | POA: Diagnosis not present

## 2021-09-28 DIAGNOSIS — R0602 Shortness of breath: Secondary | ICD-10-CM | POA: Diagnosis not present

## 2021-09-28 DIAGNOSIS — I10 Essential (primary) hypertension: Secondary | ICD-10-CM | POA: Diagnosis not present

## 2021-09-28 DIAGNOSIS — R059 Cough, unspecified: Secondary | ICD-10-CM | POA: Diagnosis not present

## 2021-09-28 DIAGNOSIS — G4730 Sleep apnea, unspecified: Secondary | ICD-10-CM | POA: Diagnosis not present

## 2021-09-28 DIAGNOSIS — R5383 Other fatigue: Secondary | ICD-10-CM | POA: Diagnosis not present

## 2021-10-02 DIAGNOSIS — G4733 Obstructive sleep apnea (adult) (pediatric): Secondary | ICD-10-CM | POA: Diagnosis not present

## 2021-10-07 DIAGNOSIS — H5213 Myopia, bilateral: Secondary | ICD-10-CM | POA: Diagnosis not present

## 2021-10-13 ENCOUNTER — Ambulatory Visit
Admission: EM | Admit: 2021-10-13 | Discharge: 2021-10-13 | Disposition: A | Discharge status: Home / Self Care | Payer: Medicaid Other | Attending: Internal Medicine | Admitting: Internal Medicine

## 2021-10-13 DIAGNOSIS — H5789 Other specified disorders of eye and adnexa: Secondary | ICD-10-CM | POA: Diagnosis not present

## 2021-10-13 MED ORDER — ERYTHROMYCIN 5 MG/GM OP OINT: TOPICAL_OINTMENT | OPHTHALMIC | 0 refills | Status: DC

## 2021-10-13 NOTE — ED Triage Notes (Signed)
Pt c/o conjunctivitis onset today states it is painful for her, denies drainage.  ?

## 2021-10-13 NOTE — Discharge Instructions (Signed)
An antibiotic eye ointment has been prescribed for you.  Please follow-up with provided contact information for eye doctor for further evaluation and management if symptoms persist or worsen. ?

## 2021-10-13 NOTE — ED Provider Notes (Signed)
?[facility] ? ? ? ?CSN: [id number] ?Arrival date & time: 10/13/21  [id number] ? ? ?  ? ?History   ?Chief Complaint ?Chief Complaint  ?Patient presents with  ? Conjunctivitis  ? ? ?HPI ?Ashley [name] is a 36 y.o. female.  ? ?Patient presents with left eye irritation and redness that has been present since awakening this morning.  Patient is attributing her symptoms to her BiPAP blowing in her face.  Denies any purulent drainage or crustiness.  Denies any associated upper respiratory symptoms or known sick contacts.  Denies any known fevers.  Patient does report blurry vision from film over her eye.  Does not wear contacts but does wear glasses.  Denies any trauma or foreign body to the eye.  Patient has used over-the-counter eyedrops with no improvement. ? ? ?Conjunctivitis ? ? ?Past Medical History:  ?Diagnosis Date  ? GERD (gastroesophageal reflux disease) 2012  ? diet controlled - no meds  ? Headache(784.0)   ? 3X WEEK  ? Infection 2009  ? GONORRHEA  ? Irregular periods/menstrual cycles 01/30/2012  ? Late prenatal care 01/30/2012  ? Poor social situation 01/30/2012  ? Previous cesarean section complicating pregnancy, antepartum condition or complication [id number]  ? 2 previous cesarean section- Desires repeat with BTL  ? Single liveborn, born in hospital, delivered by cesarean delivery 08/13/2016  ? Status post repeat low transverse cesarean section 08/14/2016  ? Status post tubal ligation at time of delivery, current hosp 08/14/2016  ? ? ?Patient Active Problem List  ? Diagnosis Date Noted  ? Irregular periods/menstrual cycles 01/30/2012  ? Obesity 11/13/2011  ? ? ?Past Surgical History:  ?Procedure Laterality Date  ? CESAREAN SECTION  2009  ? CESAREAN SECTION  01/30/2012  ? Procedure: CESAREAN SECTION;  Surgeon: Betsy Coder, MD;  Location: [facility];  Service: Gynecology;  Laterality: N/A;  Repeat C/S  ? CESAREAN SECTION WITH BILATERAL TUBAL LIGATION Bilateral 08/13/2016  ? Procedure: CESAREAN SECTION WITH  BILATERAL TUBAL LIGATION;  Surgeon: Guss Bunde, MD;  Location: [facility];  Service: Obstetrics;  Laterality: Bilateral;  ? CHOLECYSTECTOMY N/A 10/16/2013  ? Procedure: LAPAROSCOPIC CHOLECYSTECTOMY WITH INTRAOPERATIVE CHOLANGIOGRAM;  Surgeon: Imogene Burn. Georgette Dover, MD;  Location: [facility];  Service: General;  Laterality: N/A;  ? DILATION AND EVACUATION N/A 07/28/2015  ? Procedure: SUCTION, DILATATION AND EVACUATION;  Surgeon: Shelly Bombard, MD;  Location: [facility];  Service: Gynecology;  Laterality: N/A;  ? TONSILLECTOMY  AGE 7  ? TONSILLECTOMY AND ADENOIDECTOMY  AGEB 18  ? ? ?OB History   ? ? Gravida  ?4  ? Para  ?3  ? Term  ?3  ? Preterm  ?   ? AB  ?1  ? Living  ?2  ?  ? ? SAB  ?1  ? IAB  ?   ? Ectopic  ?   ? Multiple  ?0  ? Live Births  ?2  ?   ?  ? Obstetric Comments  ?NO PRENATAL CARE WITH FIRST PREGNANCY. [place].  "DID NOT KNOW SHE WAS PREGNANT UNTIL DELIVERY."  ?  ? ?  ? ? ? ?Home Medications   ? ?Prior to Admission medications   ?Medication Sig Start Date End Date Taking? Authorizing Provider  ?erythromycin ophthalmic ointment Place a 1/2 inch ribbon of ointment into the lower eyelid 4 times daily for 7 days. 10/13/21  Yes Johm Pfannenstiel, Hildred Alamin E, FNP  ?amLODipine (NORVASC) 10 MG tablet Take 10 mg by mouth daily.  [provider]  ?hydrochlorothiazide (HYDRODIURIL) 25 MG tablet Take 25 mg by mouth daily.    [provider]  ?ibuprofen (ADVIL) 800 MG tablet Take 1 tablet (800 mg total) by mouth every 8 (eight) hours as needed. 12/17/19   Mayers, Loraine Grip, PA-C  ? ? ?Family History ?Family History  ?Problem Relation Age of Onset  ? Asthma Mother   ? Hypertension Mother   ? Early death Mother   ?     AGE 87  ? Cancer Maternal Grandmother   ? ? ?Social History ?Social History  ? ?Tobacco Use  ? Smoking status: Some Days  ?  Packs/day: 0.00  ?  Years: 6.00  ?  Pack years: 0.00  ?  Types: Cigarettes  ?  Last attempt to quit: 11/01/2015  ?  Years since quitting: 5.9  ? Smokeless tobacco:  Never  ?Substance Use Topics  ? Alcohol use: No  ?  Alcohol/week: 0.0 standard drinks  ? Drug use: No  ? ? ? ?Allergies   ?Fruit extracts ? ? ?Review of Systems ?Review of Systems ?Per HPI ? ?Physical Exam ?Triage Vital Signs ?ED Triage Vitals [10/13/21 1042]  ?Enc Vitals Group  ?   BP 119/76  ?   Pulse Rate 89  ?   Resp 18  ?   Temp 98.6 ?F (37 ?C)  ?   Temp Source Oral  ?   SpO2 95 %  ?   Weight   ?   Height   ?   Head Circumference   ?   Peak Flow   ?   Pain Score 0  ?   Pain Loc   ?   Pain Edu?   ?   Excl. in [place]?   ? ?No data found. ? ?Updated Vital Signs ?BP 119/76 (BP Location: Left Arm)   Pulse 89   Temp 98.6 ?F (37 ?C) (Oral)   Resp 18   SpO2 95%  ? ?Visual Acuity ?Right Eye Distance: 20/20 ?Left Eye Distance: 20/20 ?Bilateral Distance: 20/20 ? ?Right Eye Near:   ?Left Eye Near:    ?Bilateral Near:    ? ?Physical Exam ?Constitutional:   ?   General: She is not in acute distress. ?   Appearance: Normal appearance. She is not toxic-appearing or diaphoretic.  ?HENT:  ?   Head: Normocephalic and atraumatic.  ?Eyes:  ?   General: Lids are normal. Lids are everted, no foreign bodies appreciated. Vision grossly intact. Gaze aligned appropriately.     ?   Right eye: No foreign body, discharge or hordeolum.     ?   Left eye: No foreign body, discharge or hordeolum.  ?   Extraocular Movements: Extraocular movements intact.  ?   Conjunctiva/sclera:  ?   Right eye: Right conjunctiva is not injected. No chemosis, exudate or hemorrhage. ?   Left eye: Left conjunctiva is injected. No chemosis, exudate or hemorrhage. ?   Pupils: Pupils are equal, round, and reactive to light.  ?   Comments: Scleral redness present to left eye.    ?Pulmonary:  ?   Effort: Pulmonary effort is normal.  ?Neurological:  ?   General: No focal deficit present.  ?   Mental Status: She is alert and oriented to person, place, and time. Mental status is at baseline.  ?Psychiatric:     ?   Mood and Affect: Mood normal.     ?   Behavior: Behavior  normal.     ?  Thought Content: Thought content normal.     ?   Judgment: Judgment normal.  ? ? ? ?UC Treatments / Results  ?Labs ?(all labs ordered are listed, but only abnormal results are displayed) ?Labs Reviewed - No data to display ? ?EKG ? ? ?Radiology ?No results found. ? ?Procedures ?Procedures (including critical care time) ? ?Medications Ordered in UC ?Medications - No data to display ? ?Initial Impression / Assessment and Plan / UC Course  ?I have reviewed the triage vital signs and the nursing notes. ? ?Pertinent labs & imaging results that were available during my care of the patient were reviewed by me and considered in my medical decision making (see chart for details). ? ?  ? ?Unsure exact etiology of patient's eye symptoms.  Conjunctivae is inflamed so conjunctivitis is possibility.  Advised patient that a more extensive evaluation with fluorescein stain needs to be provided but patient declined fluorescein stain.  Risks associated with not doing fluorescein stain were discussed with patient.  Patient voiced understanding.  Will treat with erythromycin to cover for infection.  Advised patient to follow-up with contact information for eye doctor if symptoms persist or worsen.  Visual acuity is normal so do not think that emergent ophthalmology referral is necessary at this time.  Discussed return precautions.  Patient verbalized understanding and was agreeable with plan. ?Final Clinical Impressions(s) / UC Diagnoses  ? ?Final diagnoses:  ?Irritation of left eye  ? ? ? ?Discharge Instructions   ? ?  ?An antibiotic eye ointment has been prescribed for you.  Please follow-up with provided contact information for eye doctor for further evaluation and management if symptoms persist or worsen. ? ? ? ?ED Prescriptions   ? ? Medication Sig Dispense Auth. Provider  ? erythromycin ophthalmic ointment Place a 1/2 inch ribbon of ointment into the lower eyelid 4 times daily for 7 days. 3.5 g Teodora Medici, [place]   ? ?  ? ?PDMP not reviewed this encounter. ?  ?Teodora Medici, [place] ?10/13/21 1119 ? ?

## 2021-11-01 DIAGNOSIS — G4733 Obstructive sleep apnea (adult) (pediatric): Secondary | ICD-10-CM | POA: Diagnosis not present

## 2021-11-04 ENCOUNTER — Encounter: Payer: Self-pay | Admitting: Emergency Medicine

## 2021-11-04 ENCOUNTER — Ambulatory Visit
Admission: EM | Admit: 2021-11-04 | Discharge: 2021-11-04 | Disposition: A | Discharge status: Home / Self Care | Payer: Medicaid Other | Attending: Urgent Care | Admitting: Urgent Care

## 2021-11-04 DIAGNOSIS — G4733 Obstructive sleep apnea (adult) (pediatric): Secondary | ICD-10-CM | POA: Diagnosis not present

## 2021-11-04 DIAGNOSIS — R519 Headache, unspecified: Secondary | ICD-10-CM

## 2021-11-04 DIAGNOSIS — Z9989 Dependence on other enabling machines and devices: Secondary | ICD-10-CM

## 2021-11-04 DIAGNOSIS — I10 Essential (primary) hypertension: Secondary | ICD-10-CM

## 2021-11-04 DIAGNOSIS — R42 Dizziness and giddiness: Secondary | ICD-10-CM | POA: Diagnosis not present

## 2021-11-04 LAB — POCT FASTING CBG KUC MANUAL ENTRY: POCT Glucose (KUC): 107 mg/dL — AB (ref 70–99)

## 2021-11-04 MED ORDER — MECLIZINE HCL 12.5 MG PO TABS: 12.5000 mg | ORAL_TABLET | Freq: Three times a day (TID) | ORAL | 0 refills | Status: DC | PRN

## 2021-11-04 MED ORDER — FLUTICASONE PROPIONATE 50 MCG/ACT NA SUSP: 2.0000 | Freq: Every day | NASAL | 12 refills | Status: DC

## 2021-11-04 MED ORDER — CETIRIZINE HCL 10 MG PO TABS: 10.0000 mg | ORAL_TABLET | Freq: Every day | ORAL | 0 refills | Status: DC

## 2021-11-04 MED ORDER — PSEUDOEPHEDRINE HCL 30 MG PO TABS: 30.0000 mg | ORAL_TABLET | Freq: Three times a day (TID) | ORAL | 0 refills | Status: DC | PRN

## 2021-11-04 NOTE — ED Triage Notes (Signed)
Pt is present today with c/o dizziness and HA. Pt sx started today ?

## 2021-11-04 NOTE — ED Provider Notes (Signed)
?[place] ? ? ?MRN: [id number] DOB: 06/06/1986 ? ?Subjective:  ? ?Ashley [name] is a 36 y.o. female presenting for acute onset today of intermittent dizziness, 1 to 2-day history of frontal headaches.  Currently she reports that it is mild.  Denies confusion, vision changes but she does feel like her eyes are being forced.  Wears eyeglasses and is supposed to have a new prescription.  Plans on picking up her new eyeglasses soon.  She does have a history of sleep apnea but denies any particular dyspnea, chest pain.  She did stop using her CPAP in the past week due to difficulty with her sinuses and her eyes.  She has had a harder time with the spring season but has not started taking any allergy medications.  Has had a lot of exposure to allergens lately.  No diaphoresis, nausea, vomiting, cough, belly pain, dysuria, hematuria, urinary frequency.  Has had regular checkups with her doctor but I do not have access to her labs.  No history of stroke, MI, diabetes.  She does have a history of essential hypertension and is compliant with her blood pressure medications.  She is not a smoker. ? ?No current facility-administered medications for this encounter. ? ?Current Outpatient Medications:  ?  amLODipine (NORVASC) 10 MG tablet, Take 10 mg by mouth daily., Disp: , Rfl:  ?  erythromycin ophthalmic ointment, Place a 1/2 inch ribbon of ointment into the lower eyelid 4 times daily for 7 days., Disp: 3.5 g, Rfl: 0 ?  hydrochlorothiazide (HYDRODIURIL) 25 MG tablet, Take 25 mg by mouth daily., Disp: , Rfl:  ?  ibuprofen (ADVIL) 800 MG tablet, Take 1 tablet (800 mg total) by mouth every 8 (eight) hours as needed., Disp: 30 tablet, Rfl: 0  ? ?Allergies  ?Allergen Reactions  ? Fruit Extracts Itching  ?  Fresh Fruit   ? ? ?Past Medical History:  ?Diagnosis Date  ? GERD (gastroesophageal reflux disease) 2012  ? diet controlled - no meds  ? Headache(784.0)   ? 3X WEEK  ? Infection 2009  ? GONORRHEA  ? Irregular  periods/menstrual cycles 01/30/2012  ? Late prenatal care 01/30/2012  ? Poor social situation 01/30/2012  ? Previous cesarean section complicating pregnancy, antepartum condition or complication 99991111  ? 2 previous cesarean section- Desires repeat with BTL  ? Single liveborn, born in hospital, delivered by cesarean delivery 08/13/2016  ? Status post repeat low transverse cesarean section 08/14/2016  ? Status post tubal ligation at time of delivery, current hosp 08/14/2016  ?  ? ?Past Surgical History:  ?Procedure Laterality Date  ? CESAREAN SECTION  2009  ? CESAREAN SECTION  01/30/2012  ? Procedure: CESAREAN SECTION;  Surgeon: Betsy Coder, MD;  Location: [facility];  Service: Gynecology;  Laterality: N/A;  Repeat C/S  ? CESAREAN SECTION WITH BILATERAL TUBAL LIGATION Bilateral 08/13/2016  ? Procedure: CESAREAN SECTION WITH BILATERAL TUBAL LIGATION;  Surgeon: Guss Bunde, MD;  Location: [facility];  Service: Obstetrics;  Laterality: Bilateral;  ? CHOLECYSTECTOMY N/A 10/16/2013  ? Procedure: LAPAROSCOPIC CHOLECYSTECTOMY WITH INTRAOPERATIVE CHOLANGIOGRAM;  Surgeon: Imogene Burn. Georgette Dover, MD;  Location: [facility];  Service: General;  Laterality: N/A;  ? DILATION AND EVACUATION N/A 07/28/2015  ? Procedure: SUCTION, DILATATION AND EVACUATION;  Surgeon: Shelly Bombard, MD;  Location: [facility];  Service: Gynecology;  Laterality: N/A;  ? TONSILLECTOMY  AGE 57  ? TONSILLECTOMY AND ADENOIDECTOMY  AGEB 18  ? ? ?Family History  ?Problem Relation Age of  Onset  ? Asthma Mother   ? Hypertension Mother   ? Early death Mother   ?     AGE 57  ? Cancer Maternal Grandmother   ? ? ?Social History  ? ?Tobacco Use  ? Smoking status: Some Days  ?  Packs/day: 0.00  ?  Years: 6.00  ?  Pack years: 0.00  ?  Types: Cigarettes  ?  Last attempt to quit: 11/01/2015  ?  Years since quitting: 6.0  ? Smokeless tobacco: Never  ?Substance Use Topics  ? Alcohol use: No  ?  Alcohol/week: 0.0 standard drinks  ? Drug use: No  ? ? ?ROS ? ? ?Objective:   ? ?Vitals: ?BP 131/85   Pulse 89   Temp (!) 97.5 ?F (36.4 ?C)   Resp 19   SpO2 94%  ? ?BP Readings from Last 3 Encounters:  ?11/04/21 131/85  ?10/13/21 119/76  ?04/23/21 110/70  ? ?Physical Exam ?Constitutional:   ?   General: She is not in acute distress. ?   Appearance: Normal appearance. She is well-developed. She is obese. She is not ill-appearing, toxic-appearing or diaphoretic.  ?HENT:  ?   Head: Normocephalic and atraumatic.  ?   Right Ear: Tympanic membrane, ear canal and external ear normal. No drainage or tenderness. No middle ear effusion. There is no impacted cerumen. Tympanic membrane is not erythematous.  ?   Left Ear: Tympanic membrane, ear canal and external ear normal. No drainage or tenderness.  No middle ear effusion. There is no impacted cerumen. Tympanic membrane is not erythematous.  ?   Nose: Nose normal. No congestion or rhinorrhea.  ?   Mouth/Throat:  ?   Mouth: Mucous membranes are moist. No oral lesions.  ?   Pharynx: No pharyngeal swelling, oropharyngeal exudate, posterior oropharyngeal erythema or uvula swelling.  ?   Tonsils: No tonsillar exudate or tonsillar abscesses.  ?Eyes:  ?   General: No scleral icterus.    ?   Right eye: No discharge.     ?   Left eye: No discharge.  ?   Extraocular Movements: Extraocular movements intact.  ?   Right eye: Normal extraocular motion.  ?   Left eye: Normal extraocular motion.  ?   Conjunctiva/sclera: Conjunctivae normal.  ?Cardiovascular:  ?   Rate and Rhythm: Normal rate.  ?   Heart sounds: No murmur heard. ?  No friction rub. No gallop.  ?Pulmonary:  ?   Effort: Pulmonary effort is normal. No respiratory distress.  ?   Breath sounds: No stridor. No wheezing, rhonchi or rales.  ?Chest:  ?   Chest wall: No tenderness.  ?Musculoskeletal:  ?   Cervical back: Normal range of motion and neck supple.  ?Lymphadenopathy:  ?   Cervical: No cervical adenopathy.  ?Skin: ?   General: Skin is warm and dry.  ?Neurological:  ?   General: No focal deficit  present.  ?   Mental Status: She is alert and oriented to person, place, and time.  ?   Cranial Nerves: No cranial nerve deficit.  ?   Motor: No weakness.  ?   Coordination: Coordination normal.  ?   Gait: Gait normal.  ?   Deep Tendon Reflexes: Reflexes normal.  ?   Comments: Negative Romberg and pronator drift.  ?Psychiatric:     ?   Mood and Affect: Mood normal.     ?   Behavior: Behavior normal.     ?   Thought Content:  Thought content normal.     ?   Judgment: Judgment normal.  ? ?Results for orders placed or performed during the hospital encounter of 11/04/21 (from the past 24 hour(s))  ?POCT CBG (manual entry)     Status: Abnormal  ? Collection Time: 11/04/21 12:29 PM  ?Result Value Ref Range  ? POCT Glucose ([facility]) 107 (A) 70 - 99 mg/dL  ? ?Assessment and Plan :  ? ?PDMP not reviewed this encounter. ? ?1. Dizziness   ?2. Frontal headache   ?3. OSA on CPAP   ?4. Essential hypertension   ? ?No signs of an acute encephalopathy, acute cardiopulmonary event.  Vital signs are hemodynamically stable.  Point-of-care blood sugar is within normal limits.  Recommended starting Flonase, Zyrtec and Sudafed to address allergic rhinitis as a possible source of her symptoms.  Use meclizine for symptomatic relief.  Recheck with PCP.  Maintain strict ER precautions. Counseled patient on potential for adverse effects with medications prescribed today, patient verbalized understanding.  ?  ?Jaynee Eagles, PA-C ?11/04/21 1239 ? ?

## 2021-11-11 ENCOUNTER — Ambulatory Visit (INDEPENDENT_AMBULATORY_CARE_PROVIDER_SITE_OTHER): Payer: Medicaid Other | Admitting: Pulmonary Disease

## 2021-11-11 ENCOUNTER — Encounter: Payer: Self-pay | Admitting: Pulmonary Disease

## 2021-11-11 VITALS — BP 132/86 | HR 92 | Temp 98.2°F | Ht 63.0 in | Wt 334.4 lb

## 2021-11-11 DIAGNOSIS — E662 Morbid (severe) obesity with alveolar hypoventilation: Secondary | ICD-10-CM

## 2021-11-11 DIAGNOSIS — J9611 Chronic respiratory failure with hypoxia: Secondary | ICD-10-CM | POA: Diagnosis not present

## 2021-11-11 DIAGNOSIS — G4733 Obstructive sleep apnea (adult) (pediatric): Secondary | ICD-10-CM | POA: Diagnosis not present

## 2021-11-11 NOTE — Patient Instructions (Signed)
Will have your Bipap setting lowered. ? ?Follow up in 6 months. ?

## 2021-11-11 NOTE — Progress Notes (Signed)
? ?[facility] ? ?Chief Complaint  ?Patient presents with  ? Follow-up  ?  Has not been compliant with CPAP lately because of pressure drying her eyes out  ? ? ?Past Surgical History:  ?She  has a past surgical history that includes Cesarean section (2009); Tonsillectomy (AGE 36); Tonsillectomy and adenoidectomy (AGEB 18); Cesarean section (01/30/2012); Cholecystectomy (N/A, 10/16/2013); Dilation and evacuation (N/A, 07/28/2015); and Cesarean section with bilateral tubal ligation (Bilateral, 08/13/2016). ? ?Past Medical History:  ?GERD, HA ? ?Constitutional:  ?BP 132/86 (BP Location: Left Wrist, Patient Position: Sitting)   Pulse 92   Temp 98.2 ?F (36.8 ?C) (Oral)   Ht 5\' 3"  (1.6 m)   Wt (!) 334 lb 6.4 oz (151.7 kg)   SpO2 96% Comment: ra  BMI 59.24 kg/m?  ? ?Brief Summary:  ?Ashley [name] is a 36 y.o. female smoker with obstructive sleep apnea and obesity hypoventilation syndrome. ?  ? ? ? ?Subjective:  ? ?Her weight at last visit was 334 lbs. ? ?She feels that Bipap helps.  She has trouble with mask fit and leak.  Air blows into her eyes and causes dryness. ? ?She wants to get set up for weight loss shots, but was told it might not be covered by her insurance. ? ? ?Physical Exam:  ? ?Appearance - well kempt  ? ?ENMT - no sinus tenderness, no oral exudate, no LAN, Mallampati 4 airway, no stridor ? ?Respiratory - equal breath sounds bilaterally, no wheezing or rales ? ?CV - s1s2 regular rate and rhythm, no murmurs ? ?Ext - no clubbing, no edema ? ?Skin - no rashes ? ?Psych - normal mood and affect ? ?  ?Sleep Tests:  ?PSG 02/17/21 >> AHI 101.8, SpO2 low 42% ?Bipap titration 05/09/21 >> Bipap 26/20 cm H2O with rise time 300, ti max 1.5, ti min 0.8, trigger medium,cycle medium and 3 liters oxygen. ? ?Social History:  ?She  reports that she quit smoking about 6 years ago. Her smoking use included cigarettes. She has never used smokeless tobacco. She reports that she does  not drink alcohol and does not use drugs. ? ?Family History:  ?Her family history includes Asthma in her mother; Cancer in her maternal grandmother; Early death in her mother; Hypertension in her mother. ?  ? ? ?Assessment/Plan:  ? ?Obstructive sleep apnea. ?- she is compliant with Bipap and reports benefit from therapy ?- she uses [employer] for her DME ?- she is having trouble with mask fit and air leak, likely related to high pressure settings ?- explained we will need to strike a balance between controlling her sleep apnea and making sure the set up is comfortable for her to use ?- will change her Bipap setting to 22/17 cm H2O ? ?Chronic respiratory failure with hypoxia from obesity hypoventilation syndrome. ?- continue 3 liters oxygen with Bipap at night ? ?Obesity. ?- discussed how weight can impact sleep and risk for sleep disordered breathing ?- discussed options to assist with weight loss: combination of diet modification, cardiovascular and strength training exercises ?- hopefully she will qualify for weight loss medication therapy; I believe this would make a tremendous difference in her health ? ?Time Spent Involved in Patient Care on Day of Examination:  ?26 minutes ? ?Follow up:  ? ?Patient Instructions  ?Will have your Bipap setting lowered. ? ?Follow up in 6 months. ? ?Medication List:  ? ?Allergies as of 11/11/2021   ? ?   Reactions  ?  Fruit Extracts Itching  ? Fresh Fruit   ? ?  ? ?  ?Medication List  ?  ? ?  ? Accurate as of Nov 11, 2021  4:43 PM. If you have any questions, ask your nurse or doctor.  ?  ?  ? ?  ? ?amLODipine 10 MG tablet ?Commonly known as: NORVASC ?Take 10 mg by mouth daily. ?  ?cetirizine 10 MG tablet ?Commonly known as: ZyrTEC Allergy ?Take 1 tablet (10 mg total) by mouth daily. ?  ?erythromycin ophthalmic ointment ?Place a 1/2 inch ribbon of ointment into the lower eyelid 4 times daily for 7 days. ?  ?fluticasone 50 MCG/ACT nasal spray ?Commonly known as: FLONASE ?Place 2 sprays  into both nostrils daily. ?  ?hydrochlorothiazide 25 MG tablet ?Commonly known as: HYDRODIURIL ?Take 25 mg by mouth daily. ?  ?ibuprofen 800 MG tablet ?Commonly known as: ADVIL ?Take 1 tablet (800 mg total) by mouth every 8 (eight) hours as needed. ?  ?meclizine 12.5 MG tablet ?Commonly known as: ANTIVERT ?Take 1 tablet (12.5 mg total) by mouth 3 (three) times daily as needed for dizziness. ?  ?pseudoephedrine 30 MG tablet ?Commonly known as: SUDAFED ?Take 1 tablet (30 mg total) by mouth every 8 (eight) hours as needed for congestion. ?  ? ?  ? ? ?Signature:  ?Coralyn Helling, MD ?[facility] ?Pager - [phone] ?11/11/2021, 4:43 PM ?  ? ? ? ? ? ? ? ? ?

## 2021-11-23 ENCOUNTER — Encounter: Payer: Self-pay | Admitting: Emergency Medicine

## 2021-11-23 ENCOUNTER — Other Ambulatory Visit: Payer: Self-pay

## 2021-11-23 ENCOUNTER — Ambulatory Visit
Admission: EM | Admit: 2021-11-23 | Discharge: 2021-11-23 | Disposition: A | Discharge status: Home / Self Care | Payer: Medicaid Other | Attending: Student | Admitting: Student

## 2021-11-23 DIAGNOSIS — B349 Viral infection, unspecified: Secondary | ICD-10-CM | POA: Diagnosis not present

## 2021-11-23 DIAGNOSIS — Z9089 Acquired absence of other organs: Secondary | ICD-10-CM

## 2021-11-23 DIAGNOSIS — J9611 Chronic respiratory failure with hypoxia: Secondary | ICD-10-CM

## 2021-11-23 NOTE — ED Provider Notes (Signed)
[name]    CSN: [id number] Arrival date & time: 11/23/21  1658      History   Chief Complaint Chief Complaint  Patient presents with   Cough    HPI Ashley [name] is a 36 y.o. female presenting with cough and congestion for 3 days.  History obesity, chronic respiratory failure with hypoxia.  She describes symptoms for 2 days.  The cough is nonproductive, and there is no increase in her baseline dyspnea.  Denies known fevers, chest pain, dizziness, weakness, nausea, vomiting.  States she does have a sick contact, unsure what they have.  States this feels like the last time she had COVID.  She does have an inhaler, but she states she does not use this as she does not feel that she needs it.  HPI  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   Irregular periods/menstrual cycles 01/30/2012   Late prenatal care 01/30/2012   Poor social situation 01/30/2012   Previous cesarean section complicating pregnancy, antepartum condition or complication 01/27/2016   2 previous cesarean section- Desires repeat with BTL   Single liveborn, born in hospital, delivered by cesarean delivery 08/13/2016   Status post repeat low transverse cesarean section 08/14/2016   Status post tubal ligation at time of delivery, current hosp 08/14/2016    Patient Active Problem List   Diagnosis Date Noted   Irregular periods/menstrual cycles 01/30/2012   Obesity 11/13/2011    Past Surgical History:  Procedure Laterality Date   CESAREAN SECTION  2009   CESAREAN SECTION  01/30/2012   Procedure: CESAREAN SECTION;  Surgeon: Michael Litter, MD;  Location: [facility];  Service: Gynecology;  Laterality: N/A;  Repeat C/S   CESAREAN SECTION WITH BILATERAL TUBAL LIGATION Bilateral 08/13/2016   Procedure: CESAREAN SECTION WITH BILATERAL TUBAL LIGATION;  Surgeon: Lesly Dukes,  MD;  Location: [facility];  Service: Obstetrics;  Laterality: Bilateral;   CHOLECYSTECTOMY N/A 10/16/2013   Procedure: LAPAROSCOPIC CHOLECYSTECTOMY WITH INTRAOPERATIVE CHOLANGIOGRAM;  Surgeon: Wilmon Arms. Corliss Skains, MD;  Location: [facility];  Service: General;  Laterality: N/A;   DILATION AND EVACUATION N/A 07/28/2015   Procedure: SUCTION, DILATATION AND EVACUATION;  Surgeon: Brock Bad, MD;  Location: [facility];  Service: Gynecology;  Laterality: N/A;   TONSILLECTOMY  AGE 29   TONSILLECTOMY AND ADENOIDECTOMY  AGEB 18    OB History     Gravida  4   Para  3   Term  3   Preterm      AB  1   Living  2      SAB  1   IAB      Ectopic      Multiple  0   Live Births  2        Obstetric Comments  NO PRENATAL CARE WITH FIRST PREGNANCY. 36-38 WKS  GEATATION.  "DID NOT KNOW SHE WAS PREGNANT UNTIL DELIVERY."          Home Medications    Prior to Admission medications   Medication Sig Start Date End Date Taking? Authorizing Provider  amLODipine (NORVASC) 10 MG tablet Take 10 mg by mouth daily.    [provider]  cetirizine (ZYRTEC ALLERGY) 10 MG tablet Take 1 tablet (10 mg total) by mouth daily. 11/04/21   Wallis Bamberg, PA-C  erythromycin ophthalmic  ointment Place a 1/2 inch ribbon of ointment into the lower eyelid 4 times daily for 7 days. 10/13/21   Gustavus Bryant, FNP  fluticasone (FLONASE) 50 MCG/ACT nasal spray Place 2 sprays into both nostrils daily. 11/04/21   Wallis Bamberg, PA-C  hydrochlorothiazide (HYDRODIURIL) 25 MG tablet Take 25 mg by mouth daily.    [provider]  ibuprofen (ADVIL) 800 MG tablet Take 1 tablet (800 mg total) by mouth every 8 (eight) hours as needed. 12/17/19   Mayers, Cari S, PA-C  meclizine (ANTIVERT) 12.5 MG tablet Take 1 tablet (12.5 mg total) by mouth 3 (three) times daily as needed for dizziness. 11/04/21   Wallis Bamberg, PA-C  pseudoephedrine (SUDAFED) 30 MG tablet Take 1 tablet (30 mg total) by mouth every 8 (eight) hours as  needed for congestion. 11/04/21   Wallis Bamberg, PA-C    Family History Family History  Problem Relation Age of Onset   Asthma Mother    Hypertension Mother    Early death Mother        AGE 11   Cancer Maternal Grandmother     Social History Social History   Tobacco Use   Smoking status: Former    Packs/day: 0.00    Years: 6.00    Pack years: 0.00    Types: Cigarettes    Quit date: 11/01/2015    Years since quitting: 6.0   Smokeless tobacco: Never  Substance Use Topics   Alcohol use: No    Alcohol/week: 0.0 standard drinks   Drug use: No     Allergies   Fruit extracts   Review of Systems Review of Systems  Constitutional:  Negative for appetite change, chills and fever.  HENT:  Positive for congestion. Negative for ear pain, rhinorrhea, sinus pressure, sinus pain and sore throat.   Eyes:  Negative for redness and visual disturbance.  Respiratory:  Positive for cough. Negative for chest tightness, shortness of breath and wheezing.   Cardiovascular:  Negative for chest pain and palpitations.  Gastrointestinal:  Negative for abdominal pain, constipation, diarrhea, nausea and vomiting.  Genitourinary:  Negative for dysuria, frequency and urgency.  Musculoskeletal:  Negative for myalgias.  Neurological:  Negative for dizziness, weakness and headaches.  Psychiatric/Behavioral:  Negative for confusion.   All other systems reviewed and are negative.   Physical Exam Triage Vital Signs ED Triage Vitals  Enc Vitals Group     BP 11/23/21 1902 121/78     Pulse Rate 11/23/21 1902 95     Resp 11/23/21 1902 18     Temp 11/23/21 1902 99.2 F (37.3 C)     Temp Source 11/23/21 1902 Oral     SpO2 11/23/21 1902 92 %     Weight --      Height --      Head Circumference --      Peak Flow --      Pain Score 11/23/21 1909 4     Pain Loc --      Pain Edu? --      Excl. in GC? --    No data found.  Updated Vital Signs BP 121/78 (BP Location: Left Arm)   Pulse 95   Temp  99.2 F (37.3 C) (Oral)   Resp 18   SpO2 93%   Visual Acuity Right Eye Distance:   Left Eye Distance:   Bilateral Distance:    Right Eye Near:   Left Eye Near:    Bilateral Near:  Physical Exam Vitals reviewed.  Constitutional:      General: She is not in acute distress.    Appearance: Normal appearance. She is obese. She is not ill-appearing.  HENT:     Head: Normocephalic and atraumatic.     Right Ear: Tympanic membrane, ear canal and external ear normal. No tenderness. No middle ear effusion. There is no impacted cerumen. Tympanic membrane is not perforated, erythematous, retracted or bulging.     Left Ear: Tympanic membrane, ear canal and external ear normal. No tenderness.  No middle ear effusion. There is no impacted cerumen. Tympanic membrane is not perforated, erythematous, retracted or bulging.     Nose: Nose normal. No congestion.     Mouth/Throat:     Mouth: Mucous membranes are moist.     Pharynx: Uvula midline. No oropharyngeal exudate or posterior oropharyngeal erythema.  Eyes:     Extraocular Movements: Extraocular movements intact.     Pupils: Pupils are equal, round, and reactive to light.  Cardiovascular:     Rate and Rhythm: Normal rate and regular rhythm.     Heart sounds: Normal heart sounds.  Pulmonary:     Effort: Pulmonary effort is normal.     Breath sounds: Normal breath sounds. No decreased breath sounds, wheezing, rhonchi or rales.     Comments: Occ dry cough  Abdominal:     Palpations: Abdomen is soft.     Tenderness: There is no abdominal tenderness. There is no guarding or rebound.  Lymphadenopathy:     Cervical: No cervical adenopathy.     Right cervical: No superficial cervical adenopathy.    Left cervical: No superficial cervical adenopathy.  Neurological:     General: No focal deficit present.     Mental Status: She is alert and oriented to person, place, and time.  Psychiatric:        Mood and Affect: Mood normal.         Behavior: Behavior normal.        Thought Content: Thought content normal.        Judgment: Judgment normal.     UC Treatments / Results  Labs (all labs ordered are listed, but only abnormal results are displayed) Labs Reviewed  NOVEL CORONAVIRUS, NAA    EKG   Radiology No results found.  Procedures Procedures (including critical care time)  Medications Ordered in UC Medications - No data to display  Initial Impression / Assessment and Plan / UC Course  I have reviewed the triage vital signs and the nursing notes.  Pertinent labs & imaging results that were available during my care of the patient were reviewed by me and considered in my medical decision making (see chart for details).     This patient is a very pleasant 36 y.70o. year old female presenting with viral syndrome x2 days. Today this pt is afebrile nontachycardic nontachypneic, oxygenating well on room air, no wheezes rhonchi or rales.  Nontoxic-appearing, no adventitious breath sounds.  She is oxygenating at 93% on room air, her last visit with us on 11/04/2021 she was oxygenating at 94% on room air.  History of chronic respiratory failure with hypoxia related to obesity hypoventilation syndrome, she does have a rescue inhaler that she does not use.  Followed closely by pulm, last visit was 11/11/2021.  Given no adventitious breath sounds and hemodynamically stable, she is in agreement that chest x-ray is not indicated at this time.  We will check a COVID PCR, she would be eligible  for molnupiravir given respiratory failure.  She is content with over-the-counter medications for symptomatic relief. ED return precautions discussed. Patient verbalizes understanding and agreement.    Final Clinical Impressions(s) / UC Diagnoses   Final diagnoses:  Viral syndrome  History of tonsillectomy  Chronic respiratory failure with hypoxia (HCC)     Discharge Instructions      -With a virus, you're typically contagious for 5-7  days, or as long as you're having fevers.  -Follow-up if new/worsening shortness of breath, new/worsening chest pain, new/worsening fevers, coughing up dark or red sputum.   ED Prescriptions   None    PDMP not reviewed this encounter.   Rhys Martini, PA-C 11/23/21 1931

## 2021-11-23 NOTE — ED Triage Notes (Signed)
Pt here for cough and congestion x 3 days 

## 2021-11-23 NOTE — Discharge Instructions (Addendum)
-  With a virus, you're typically contagious for 5-7 days, or as long as you're having fevers.  -Follow-up if new/worsening shortness of breath, new/worsening chest pain, new/worsening fevers, coughing up dark or red sputum.

## 2021-11-25 LAB — NOVEL CORONAVIRUS, NAA: SARS-CoV-2, NAA: NOT DETECTED

## 2021-12-29 ENCOUNTER — Other Ambulatory Visit: Payer: Self-pay | Admitting: Physician Assistant

## 2021-12-29 DIAGNOSIS — G43019 Migraine without aura, intractable, without status migrainosus: Secondary | ICD-10-CM

## 2021-12-29 NOTE — Telephone Encounter (Signed)
Mobile unit provider Requested Prescriptions  Pending Prescriptions Disp Refills  . ibuprofen (ADVIL) 800 MG tablet [Pharmacy Med Name: IBUPROFEN 800 MG TABLET] 30 tablet 0    Sig: TAKE 1 TABLET BY MOUTH EVERY 8 HOURS AS NEEDED     There is no refill protocol information for this order

## 2022-02-26 ENCOUNTER — Encounter: Payer: Self-pay | Admitting: Physician Assistant

## 2022-02-26 ENCOUNTER — Ambulatory Visit
Admission: EM | Admit: 2022-02-26 | Discharge: 2022-02-26 | Disposition: A | Discharge status: Home / Self Care | Payer: Medicaid Other | Attending: Physician Assistant | Admitting: Physician Assistant

## 2022-02-26 DIAGNOSIS — N6489 Other specified disorders of breast: Secondary | ICD-10-CM

## 2022-02-26 DIAGNOSIS — G43019 Migraine without aura, intractable, without status migrainosus: Secondary | ICD-10-CM

## 2022-02-26 MED ORDER — IBUPROFEN 600 MG PO TABS: 600.0000 mg | ORAL_TABLET | Freq: Three times a day (TID) | ORAL | 0 refills | Status: DC | PRN
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
## 2022-02-26 NOTE — Discharge Instructions (Signed)
I believe that you have a collection of blood causing your pain.  Use warm compress.  Take ibuprofen 600 mg every 6-8 hours.  Do not take additional NSAIDs including aspirin, ibuprofen/Advil, naproxen/Aleve due to risk of GI bleeding.  You can use Tylenol for breakthrough pain.  If your symptoms are proving quickly return for reevaluation as she may consider an ultrasound or other imaging.  If anything worsens and you have increased pain, swelling, fever, nausea, vomiting you need to be seen immediately.

## 2022-02-26 NOTE — ED Provider Notes (Signed)
[name]    CSN: [id number] Arrival date & time: 02/26/22  1327      History   Chief Complaint Chief Complaint  Patient presents with   breast injury     HPI Ashley [name] is a 36 y.o. female.   Patient presents today with a 2-day history of left breast pain following injury.  Reports that 2 days ago her son was jumping and roughhousing with her when his knee landed on the lateral portion of her left breast.  She has had ongoing pain since that time.  Pain is rated 9 on a 0-10 pain scale, described as throbbing, no aggravating or alleviating factors identified.  She has not tried any over-the-counter medication for symptom management.  Denies any fever, nausea, vomiting, nipple discharge, chest pain, shortness of breath.    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   Irregular periods/menstrual cycles 01/30/2012   Late prenatal care 01/30/2012   Poor social situation 01/30/2012   Previous cesarean section complicating pregnancy, antepartum condition or complication 01/27/2016   2 previous cesarean section- Desires repeat with BTL   Single liveborn, born in hospital, delivered by cesarean delivery 08/13/2016   Status post repeat low transverse cesarean section 08/14/2016   Status post tubal ligation at time of delivery, current hosp 08/14/2016    Patient Active Problem List   Diagnosis Date Noted   Irregular periods/menstrual cycles 01/30/2012   Obesity 11/13/2011    Past Surgical History:  Procedure Laterality Date   CESAREAN SECTION  2009   CESAREAN SECTION  01/30/2012   Procedure: CESAREAN SECTION;  Surgeon: Michael Litter, MD;  Location: [facility];  Service: Gynecology;  Laterality: N/A;  Repeat C/S   CESAREAN SECTION WITH BILATERAL TUBAL LIGATION Bilateral 08/13/2016   Procedure: CESAREAN SECTION WITH BILATERAL TUBAL  LIGATION;  Surgeon: Lesly Dukes, MD;  Location: [facility];  Service: Obstetrics;  Laterality: Bilateral;   CHOLECYSTECTOMY N/A 10/16/2013   Procedure: LAPAROSCOPIC CHOLECYSTECTOMY WITH INTRAOPERATIVE CHOLANGIOGRAM;  Surgeon: Wilmon Arms. Corliss Skains, MD;  Location: [facility];  Service: General;  Laterality: N/A;   DILATION AND EVACUATION N/A 07/28/2015   Procedure: SUCTION, DILATATION AND EVACUATION;  Surgeon: Brock Bad, MD;  Location: [facility];  Service: Gynecology;  Laterality: N/A;   TONSILLECTOMY  AGE 45   TONSILLECTOMY AND ADENOIDECTOMY  AGEB 18    OB History     Gravida  4   Para  3   Term  3   Preterm      AB  1   Living  2      SAB  1   IAB      Ectopic      Multiple  0   Live Births  2        Obstetric Comments  NO PRENATAL CARE WITH FIRST PREGNANCY. 36-38 WKS  GEATATION.  "DID NOT KNOW SHE WAS PREGNANT UNTIL DELIVERY."          Home Medications    Prior to Admission medications   Medication Sig Start Date End Date Taking? Authorizing Provider  amLODipine (NORVASC) 10 MG tablet Take 10 mg by mouth daily.    [provider]  cetirizine (ZYRTEC ALLERGY) 10 MG tablet Take 1 tablet (10 mg total) by mouth daily. 11/04/21   Wallis Bamberg, PA-C  erythromycin ophthalmic ointment Place a 1/2 inch ribbon of ointment into the lower eyelid 4 times daily for 7 days. 10/13/21   Gustavus Bryant, FNP  fluticasone (FLONASE) 50 MCG/ACT nasal spray Place 2 sprays into both nostrils daily. 11/04/21   Wallis Bamberg, PA-C  hydrochlorothiazide (HYDRODIURIL) 25 MG tablet Take 25 mg by mouth daily.    [provider]  ibuprofen (ADVIL) 600 MG tablet Take 1 tablet (600 mg total) by mouth every 8 (eight) hours as needed. 02/26/22   Lenny Bouchillon, Noberto Retort, PA-C  meclizine (ANTIVERT) 12.5 MG tablet Take 1 tablet (12.5 mg total) by mouth 3 (three) times daily as needed for dizziness. 11/04/21   Wallis Bamberg, PA-C  pseudoephedrine (SUDAFED) 30 MG tablet Take 1 tablet (30 mg total)  by mouth every 8 (eight) hours as needed for congestion. 11/04/21   Wallis Bamberg, PA-C    Family History Family History  Problem Relation Age of Onset   Asthma Mother    Hypertension Mother    Early death Mother        AGE 20   Cancer Maternal Grandmother     Social History Social History   Tobacco Use   Smoking status: Former    Packs/day: 0.00    Years: 6.00    Total pack years: 0.00    Types: Cigarettes    Quit date: 11/01/2015    Years since quitting: 6.3   Smokeless tobacco: Never  Substance Use Topics   Alcohol use: No    Alcohol/week: 0.0 standard drinks of alcohol   Drug use: No     Allergies   Fruit extracts   Review of Systems Review of Systems  Constitutional:  Positive for activity change. Negative for appetite change, fatigue and fever.  Respiratory:  Negative for cough and shortness of breath.   Cardiovascular:  Negative for chest pain.  Gastrointestinal:  Negative for abdominal pain, diarrhea, nausea and vomiting.     Physical Exam Triage Vital Signs ED Triage Vitals  Enc Vitals Group     BP 02/26/22 1346 (!) 144/80     Pulse Rate 02/26/22 1346 98     Resp 02/26/22 1346 (!) 22     Temp 02/26/22 1346 98.2 F (36.8 C)     Temp Source 02/26/22 1346 Oral     SpO2 02/26/22 1346 92 %     Weight --      Height --      Head Circumference --      Peak Flow --      Pain Score 02/26/22 1345 0     Pain Loc --      Pain Edu? --      Excl. in GC? --    No data found.  Updated Vital Signs BP (!) 144/80   Pulse 98   Temp 98.2 F (36.8 C) (Oral)   Resp (!) 22   SpO2 92%   Visual Acuity Right Eye Distance:   Left Eye Distance:   Bilateral Distance:    Right Eye Near:   Left Eye Near:    Bilateral Near:     Physical Exam Vitals reviewed.  Constitutional:      General: She is awake. She is not in acute distress.    Appearance: Normal appearance. She is well-developed. She is not ill-appearing.     Comments: Very pleasant female appears  stated age in no acute distress sitting comfortably in exam room  HENT:     Head: Normocephalic and  atraumatic.  Cardiovascular:     Rate and Rhythm: Normal rate and regular rhythm.     Heart sounds: Normal heart sounds, S1 normal and S2 normal. No murmur heard. Pulmonary:     Effort: Pulmonary effort is normal.     Breath sounds: Normal breath sounds. No wheezing, rhonchi or rales.     Comments: Clear to auscultation bilaterally Chest:     Chest wall: Tenderness present. No deformity or swelling.  Breasts:    Right: No swelling, mass, nipple discharge, skin change or tenderness.     Left: Tenderness present. No swelling, mass, nipple discharge or skin change.       Comments: Significant tenderness palpation with induration noted left outer upper quadrant.   Abdominal:     General: Bowel sounds are normal.     Palpations: Abdomen is soft.     Tenderness: There is no abdominal tenderness.  Psychiatric:        Behavior: Behavior is cooperative.      UC Treatments / Results  Labs (all labs ordered are listed, but only abnormal results are displayed) Labs Reviewed - No data to display  EKG   Radiology No results found.  Procedures Procedures (including critical care time)  Medications Ordered in UC Medications - No data to display  Initial Impression / Assessment and Plan / UC Course  I have reviewed the triage vital signs and the nursing notes.  Pertinent labs & imaging results that were available during my care of the patient were reviewed by me and considered in my medical decision making (see chart for details).     Suspect hematoma related to recent trauma as etiology of symptoms.  Recommended conservative treatment measures including heat as well as NSAIDs.  Ibuprofen 600 mg was sent to pharmacy with instruction to use this every 6-8 hours as needed.  She is to avoid additional NSAIDs with this medication.  No evidence of infection on exam that warrant initiation  of antibiotics.  Discussed that if she continues to have pain and swelling we would need to consider imaging such as ultrasound.  Discussed that if she has any worsening symptoms including increased pain, nipple discharge, fever, nausea, vomiting she needs to be seen immediately.  Strict return precautions given.  Final Clinical Impressions(s) / UC Diagnoses   Final diagnoses:  Breast hematoma     Discharge Instructions      I believe that you have a collection of blood causing your pain.  Use warm compress.  Take ibuprofen 600 mg every 6-8 hours.  Do not take additional NSAIDs including aspirin, ibuprofen/Advil, naproxen/Aleve due to risk of GI bleeding.  You can use Tylenol for breakthrough pain.  If your symptoms are proving quickly return for reevaluation as she may consider an ultrasound or other imaging.  If anything worsens and you have increased pain, swelling, fever, nausea, vomiting you need to be seen immediately.     ED Prescriptions     Medication Sig Dispense Auth. Provider   ibuprofen (ADVIL) 600 MG tablet Take 1 tablet (600 mg total) by mouth every 8 (eight) hours as needed. 30 tablet Shantrell Placzek, Noberto Retort, PA-C      PDMP not reviewed this encounter.   Jeani Hawking, PA-C 02/26/22 1422

## 2022-05-04 ENCOUNTER — Telehealth: Payer: Self-pay | Admitting: Pulmonary Disease

## 2022-05-06 NOTE — Telephone Encounter (Signed)
Attempted to call pt but unable to reach. Unable to leave VM as mailbox has not been set up yet. Will try to call back later.

## 2022-05-06 NOTE — Telephone Encounter (Signed)
Please schedule an ROV with me or an NP to discuss treatment options for his severe sleep apnea and chronic respiratory failure.

## 2022-05-06 NOTE — Telephone Encounter (Signed)
PT returned call. I did let pt know that her VM is not set up and due to that we will not be able to leave her a message.

## 2022-05-12 ENCOUNTER — Encounter (HOSPITAL_BASED_OUTPATIENT_CLINIC_OR_DEPARTMENT_OTHER): Payer: Self-pay | Admitting: Pulmonary Disease

## 2022-05-12 ENCOUNTER — Ambulatory Visit (INDEPENDENT_AMBULATORY_CARE_PROVIDER_SITE_OTHER): Payer: Medicaid Other | Admitting: Pulmonary Disease

## 2022-05-12 VITALS — BP 120/76 | HR 96 | Temp 98.6°F | Ht 63.0 in | Wt 343.8 lb

## 2022-05-12 DIAGNOSIS — E662 Morbid (severe) obesity with alveolar hypoventilation: Secondary | ICD-10-CM | POA: Diagnosis not present

## 2022-05-12 DIAGNOSIS — G4733 Obstructive sleep apnea (adult) (pediatric): Secondary | ICD-10-CM

## 2022-05-12 DIAGNOSIS — J9611 Chronic respiratory failure with hypoxia: Secondary | ICD-10-CM | POA: Diagnosis not present

## 2022-05-12 NOTE — Patient Instructions (Signed)
Will arrange for sleep study and call you with results  Follow up in 5 months

## 2022-05-12 NOTE — Progress Notes (Signed)
[facility]  Chief Complaint  Patient presents with   Follow-up    Hasn't used cpap in 31mo    Past Surgical History:  Ashley [name]  has a past surgical history that includes Cesarean section (2009); Tonsillectomy (AGE 36); Tonsillectomy and adenoidectomy (AGEB 18); Cesarean section (01/30/2012); Cholecystectomy (N/A, 10/16/2013); Dilation and evacuation (N/A, 07/28/2015); and Cesarean section with bilateral tubal ligation (Bilateral, 08/13/2016).  Past Medical History:  GERD, HA  Constitutional:  BP 120/76 (BP Location: Left Arm, Cuff Size: Large)   Pulse 96   Temp 98.6 F (37 C) (Oral)   Ht 5\' 3"  (1.6 m)   Wt (!) 343 lb 12.8 oz (155.9 kg)   SpO2 95%   BMI 60.90 kg/m   Brief Summary:  Ashley [name] is a 36 y.o. female smoker with obstructive sleep apnea and obesity hypoventilation syndrome.      Subjective:   Ashley [name] had her Bipap and oxygen equipment recalled.  Ashley [name] got sick and then was moving.  As a result Ashley [name] had trouble keeping up with therapy.  Ashley [name] has trouble staying awake when Ashley [name] sits quiet.  Ashley [name] can't lay flat when asleep.  Snores and feels like her breathing cuts off.  Her weight is up 10 lbs since her last visit.  Ashley [name] has been working on her diet, but seems to be stuck.  Exercising is hard for her because of back pain.  Physical Exam:   Appearance - well kempt   ENMT - no sinus tenderness, no oral exudate, no LAN, Mallampati 4 airway, no stridor  Respiratory - equal breath sounds bilaterally, no wheezing or rales  CV - s1s2 regular rate and rhythm, no murmurs  Ext - no clubbing, no edema  Skin - no rashes  Psych - normal mood and affect     Sleep Tests:  PSG 02/17/21 >> AHI 101.8, SpO2 low 42% Bipap titration 05/09/21 >> Bipap 26/20 cm H2O with rise time 300, ti max 1.5, ti min 0.8, trigger medium,cycle medium and 3 liters oxygen.  Social History:  Ashley [name]  reports that Ashley [name] quit smoking about 6 years ago. Her smoking  use included cigarettes. Ashley [name] has never used smokeless tobacco. Ashley [name] reports that Ashley [name] does not drink alcohol and does not use drugs.  Family History:  Her family history includes Asthma in her mother; Cancer in her maternal grandmother; Early death in her mother; Hypertension in her mother.     Assessment/Plan:   Obstructive sleep apnea. - Ashley [name] continues to have snoring, sleep disruption, apnea and daytime sleepiness - I am almost certain Ashley [name] still has significant sleep apnea - will arrange for split night sleep study and then likely resume PAP therapy; Ashley [name] was previously using Bipap 22/17 cm H2O through [facility]  Chronic respiratory failure with hypoxia from obesity hypoventilation syndrome. - will need to have sleep study done in the sleep lab to help determine if Ashley [name] has significant hypoxia/hypoventilation and might need nocturnal oxygen set up again  Obesity. - Ashley [name] will speak with her PCP again about weight loss medications and see if Ashley [name] can get approval now  Hypertension. - Ashley [name] stopped her blood pressure medications - discussed importance of not making medication changes without speaking with her provider - Ashley [name] will follow up with her PCP  Time Spent Involved in Patient Care on Day of Examination:  27 minutes  Follow up:   Patient Instructions  Will arrange for sleep study and call you with results  Follow  up in 5 months  Medication List:   Allergies as of 05/12/2022       Reactions   Fruit Extracts Itching   Fresh Fruit         Medication List        Accurate as of May 12, 2022  9:06 AM. If you have any questions, ask your nurse or doctor.          STOP taking these medications    cetirizine 10 MG tablet Commonly known as: ZyrTEC Allergy Stopped by: Coralyn Helling, MD   erythromycin ophthalmic ointment Stopped by: Coralyn Helling, MD   fluticasone 50 MCG/ACT nasal spray Commonly known as: FLONASE Stopped by: Coralyn Helling, MD   ibuprofen 600 MG  tablet Commonly known as: ADVIL Stopped by: Coralyn Helling, MD   meclizine 12.5 MG tablet Commonly known as: ANTIVERT Stopped by: Coralyn Helling, MD   pseudoephedrine 30 MG tablet Commonly known as: SUDAFED Stopped by: Coralyn Helling, MD       TAKE these medications    amLODipine 10 MG tablet Commonly known as: NORVASC Take 10 mg by mouth daily.   hydrochlorothiazide 25 MG tablet Commonly known as: HYDRODIURIL Take 25 mg by mouth daily.        Signature:  Coralyn Helling, MD [facility] Pulmonary/Critical Care Pager - [phone] 05/12/2022, 9:06 AM

## 2022-05-31 DIAGNOSIS — R635 Abnormal weight gain: Secondary | ICD-10-CM | POA: Diagnosis not present

## 2022-05-31 DIAGNOSIS — Z87891 Personal history of nicotine dependence: Secondary | ICD-10-CM | POA: Diagnosis not present

## 2022-05-31 DIAGNOSIS — R5383 Other fatigue: Secondary | ICD-10-CM | POA: Diagnosis not present

## 2022-05-31 DIAGNOSIS — I10 Essential (primary) hypertension: Secondary | ICD-10-CM | POA: Diagnosis not present

## 2022-05-31 DIAGNOSIS — G4730 Sleep apnea, unspecified: Secondary | ICD-10-CM | POA: Diagnosis not present

## 2022-06-05 ENCOUNTER — Ambulatory Visit (HOSPITAL_BASED_OUTPATIENT_CLINIC_OR_DEPARTMENT_OTHER): Payer: Medicaid Other | Attending: Pulmonary Disease | Admitting: Pulmonary Disease

## 2022-06-05 DIAGNOSIS — E662 Morbid (severe) obesity with alveolar hypoventilation: Secondary | ICD-10-CM

## 2022-06-05 DIAGNOSIS — G4736 Sleep related hypoventilation in conditions classified elsewhere: Secondary | ICD-10-CM | POA: Insufficient documentation

## 2022-06-05 DIAGNOSIS — G4733 Obstructive sleep apnea (adult) (pediatric): Secondary | ICD-10-CM | POA: Insufficient documentation

## 2022-06-05 DIAGNOSIS — J9611 Chronic respiratory failure with hypoxia: Secondary | ICD-10-CM | POA: Insufficient documentation

## 2022-06-08 ENCOUNTER — Encounter (HOSPITAL_BASED_OUTPATIENT_CLINIC_OR_DEPARTMENT_OTHER): Payer: Medicaid Other | Admitting: Pulmonary Disease

## 2022-06-10 ENCOUNTER — Encounter (HOSPITAL_BASED_OUTPATIENT_CLINIC_OR_DEPARTMENT_OTHER): Payer: Medicaid Other | Admitting: Pulmonary Disease

## 2022-06-13 ENCOUNTER — Telehealth: Payer: Self-pay | Admitting: Pulmonary Disease

## 2022-06-13 DIAGNOSIS — G4733 Obstructive sleep apnea (adult) (pediatric): Secondary | ICD-10-CM

## 2022-06-13 DIAGNOSIS — J9611 Chronic respiratory failure with hypoxia: Secondary | ICD-10-CM

## 2022-06-13 DIAGNOSIS — E662 Morbid (severe) obesity with alveolar hypoventilation: Secondary | ICD-10-CM

## 2022-06-13 NOTE — Telephone Encounter (Signed)
PSG 06/05/22 >> AHI 114.1, SpO2 low 63%.  Bipap 24/20 with 3 liters O2 >> AHI 4.5   Please let her know her sleep study showed very severe sleep apnea.  She did well with Bipap and 3 liters oxygen.  Please send order to arrange for new Resmed Bipap at 24/20 cm H2O with 3 liters oxygen.  She needs ROV in 4 months.

## 2022-06-13 NOTE — Procedures (Signed)
Patient Name: Ashley [name], Omar Date: 06/05/2022 Gender: Female D.O.B: Jun 04, 1986 Age (years): 48 Referring Provider: Chesley Mires MD, ABSM Height (inches): 63 Interpreting Physician: Chesley Mires MD, ABSM Weight (lbs): 343 RPSGT: Zadie Rhine BMI: 61 MRN: [id number] Neck Size: 19.50  CLINICAL INFORMATION The patient is referred for a split night study with BPAP. Most recent polysomnogram dated 02/18/2021 revealed an AHI of 101.8/h and RDI of 101.8/h. Most recent titration study dated 05/09/2021 revealed an AHI of 3.1/h.  MEDICATIONS Medications self-administered by patient taken the night of the study : N/A  SLEEP STUDY TECHNIQUE As per the AASM Manual for the Scoring of Sleep and Associated Events v2.3 (April 2016) with a hypopnea requiring 4% desaturations.  The channels recorded and monitored were frontal, central and occipital EEG, electrooculogram (EOG), submentalis EMG (chin), nasal and oral airflow, thoracic and abdominal wall motion, anterior tibialis EMG, snore microphone, electrocardiogram, and pulse oximetry. Bi-level positive airway pressure (BiPAP) was initiated when the patient met split night criteria and was titrated according to treat sleep-disordered breathing.  RESPIRATORY PARAMETERS Diagnostic  Total AHI (/hr): 114.1 RDI (/hr): 117.3 OA Index (/hr): 0.9 CA Index (/hr): 0.0 REM AHI (/hr): N/A NREM AHI (/hr): 114.1 Supine AHI (/hr): 114.1 Non-supine AHI (/hr): N/A Min O2 Sat (%): 63.0 Mean O2 (%): 79.6 Time below 88% (min): 124.9   Titration  Optimal IPAP Pressure (cm): 24 Optimal EPAP Pressure (cm): 20 AHI at Optimal Pressure (/hr): 4.5 Min O2 at Optimal Pressure (%): 73.0 Sleep % at Optimal (%): 100 Supine % at Optimal (%): 100   She was tried on CPAP but continued to have obstructive respiratory events.  These improved once she was on adequate Bipap settings.  She continued to have oxygen desaturation below 88% in the absence of other  respiratory events and these lasted more than 5 minutes.  She needed 3 liters supplemental oxygen added in with Bipap.  SLEEP ARCHITECTURE The study was initiated at 10:08:22 PM and terminated at 4:38:29 AM. The total recorded time was 390.1 minutes. EEG confirmed total sleep time was 358 minutes yielding a sleep efficiency of 91.8%. Sleep onset after lights out was 8.1 minutes with a REM latency of 154.0 minutes. The patient spent 1.1% of the night in stage N1 sleep, 62.2% in stage N2 sleep, 18.0% in stage N3 and 18.7% in REM. Wake after sleep onset (WASO) was 24.0 minutes. The Arousal Index was 63.2/hour.  LEG MOVEMENT DATA The total Periodic Limb Movements of Sleep (PLMS) were 0. The PLMS index was 0.0 .  CARDIAC DATA The 2 lead EKG demonstrated sinus rhythm. The mean heart rate was 100.0 beats per minute. Other EKG findings include: None.  IMPRESSIONS - Severe obstructive sleep apnea with an AHI of 114.1 and SpO2 low of 63%. - CPAP failed to control her obstructive sleep apnea. - She did best with Bipap 24/20 cm H2O. - She required 3 liters supplemental oxygen with Bipap.  DIAGNOSIS - Obstructive Sleep Apnea (G47.33) - Nocturnal Hypoxemia (G47.36)  RECOMMENDATIONS - Trial of BiPAP therapy on 24/20 cm H2O with 3 liters oxygen. - She was fitted with a Medium size [employer] Full Face Simplus mask and heated humidification. - Avoid alcohol, sedatives and other CNS depressants that may worsen sleep apnea and disrupt normal sleep architecture. - Sleep hygiene should be reviewed to assess factors that may improve sleep quality. - Weight management and regular exercise should be initiated or continued.  [Electronically signed] 06/13/2022 05:19 PM  Chesley Mires MD,  ABSM Diplomate, American Board of Sleep Medicine NPI: [id number]  [place] PH: [phone]   FX: [phone] [place]

## 2022-06-21 NOTE — Telephone Encounter (Signed)
ATC patient. No VM set up. Will attempt to call again.

## 2022-06-30 NOTE — Telephone Encounter (Signed)
Called and spoke with patient. She verbalized understanding of results. She wishes to proceed with the bipap order. She verbalized understanding of the process. Order for bipap and O2 have been placed.   Nothing further needed at time of call.

## 2022-07-01 DIAGNOSIS — J9611 Chronic respiratory failure with hypoxia: Secondary | ICD-10-CM | POA: Diagnosis not present

## 2022-07-01 DIAGNOSIS — G4733 Obstructive sleep apnea (adult) (pediatric): Secondary | ICD-10-CM | POA: Diagnosis not present

## 2022-07-25 DIAGNOSIS — G4733 Obstructive sleep apnea (adult) (pediatric): Secondary | ICD-10-CM | POA: Diagnosis not present

## 2022-08-01 DIAGNOSIS — J9611 Chronic respiratory failure with hypoxia: Secondary | ICD-10-CM | POA: Diagnosis not present

## 2022-08-01 DIAGNOSIS — G4733 Obstructive sleep apnea (adult) (pediatric): Secondary | ICD-10-CM | POA: Diagnosis not present

## 2022-08-04 DIAGNOSIS — G4730 Sleep apnea, unspecified: Secondary | ICD-10-CM | POA: Diagnosis not present

## 2022-08-04 DIAGNOSIS — F17210 Nicotine dependence, cigarettes, uncomplicated: Secondary | ICD-10-CM | POA: Diagnosis not present

## 2022-08-04 DIAGNOSIS — K5900 Constipation, unspecified: Secondary | ICD-10-CM | POA: Diagnosis not present

## 2022-08-04 DIAGNOSIS — I10 Essential (primary) hypertension: Secondary | ICD-10-CM | POA: Diagnosis not present

## 2022-08-04 DIAGNOSIS — F1290 Cannabis use, unspecified, uncomplicated: Secondary | ICD-10-CM | POA: Diagnosis not present

## 2022-09-01 DIAGNOSIS — G4733 Obstructive sleep apnea (adult) (pediatric): Secondary | ICD-10-CM | POA: Diagnosis not present

## 2022-09-01 DIAGNOSIS — J9611 Chronic respiratory failure with hypoxia: Secondary | ICD-10-CM | POA: Diagnosis not present

## 2022-09-02 DIAGNOSIS — G4733 Obstructive sleep apnea (adult) (pediatric): Secondary | ICD-10-CM | POA: Diagnosis not present

## 2022-09-30 DIAGNOSIS — G4733 Obstructive sleep apnea (adult) (pediatric): Secondary | ICD-10-CM | POA: Diagnosis not present

## 2022-09-30 DIAGNOSIS — J9611 Chronic respiratory failure with hypoxia: Secondary | ICD-10-CM | POA: Diagnosis not present

## 2022-10-13 ENCOUNTER — Other Ambulatory Visit: Payer: Self-pay | Admitting: Family Medicine

## 2022-10-13 DIAGNOSIS — I10 Essential (primary) hypertension: Secondary | ICD-10-CM | POA: Diagnosis not present

## 2022-10-13 DIAGNOSIS — R140 Abdominal distension (gaseous): Secondary | ICD-10-CM | POA: Diagnosis not present

## 2022-10-13 DIAGNOSIS — R109 Unspecified abdominal pain: Secondary | ICD-10-CM | POA: Diagnosis not present

## 2022-10-13 DIAGNOSIS — K5900 Constipation, unspecified: Secondary | ICD-10-CM | POA: Diagnosis not present

## 2022-10-13 DIAGNOSIS — R062 Wheezing: Secondary | ICD-10-CM | POA: Diagnosis not present

## 2022-10-13 DIAGNOSIS — G4730 Sleep apnea, unspecified: Secondary | ICD-10-CM | POA: Diagnosis not present

## 2022-10-13 DIAGNOSIS — R102 Pelvic and perineal pain: Secondary | ICD-10-CM | POA: Diagnosis not present

## 2022-10-24 DIAGNOSIS — G4733 Obstructive sleep apnea (adult) (pediatric): Secondary | ICD-10-CM | POA: Diagnosis not present

## 2022-10-25 ENCOUNTER — Encounter (HOSPITAL_BASED_OUTPATIENT_CLINIC_OR_DEPARTMENT_OTHER): Payer: Self-pay | Admitting: Pulmonary Disease

## 2022-10-25 ENCOUNTER — Ambulatory Visit (INDEPENDENT_AMBULATORY_CARE_PROVIDER_SITE_OTHER): Payer: Medicaid Other | Admitting: Pulmonary Disease

## 2022-10-25 VITALS — BP 136/80 | HR 103 | Temp 97.9°F | Ht 63.0 in | Wt 317.2 lb

## 2022-10-25 DIAGNOSIS — G4733 Obstructive sleep apnea (adult) (pediatric): Secondary | ICD-10-CM

## 2022-10-25 DIAGNOSIS — J9611 Chronic respiratory failure with hypoxia: Secondary | ICD-10-CM

## 2022-10-25 DIAGNOSIS — J9612 Chronic respiratory failure with hypercapnia: Secondary | ICD-10-CM

## 2022-10-25 DIAGNOSIS — E662 Morbid (severe) obesity with alveolar hypoventilation: Secondary | ICD-10-CM

## 2022-10-25 NOTE — Patient Instructions (Signed)
Will change your Bipap setting to 16/12 cm water pressure  Follow up in 2 months

## 2022-10-25 NOTE — Progress Notes (Signed)
[facility]  Chief Complaint  Patient presents with   Follow-up    Follow up.     Past Surgical History:  She  has a past surgical history that includes Cesarean section (2009); Tonsillectomy (AGE 37); Tonsillectomy and adenoidectomy (AGEB 18); Cesarean section (01/30/2012); Cholecystectomy (N/A, 10/16/2013); Dilation and evacuation (N/A, 07/28/2015); and Cesarean section with bilateral tubal ligation (Bilateral, 08/13/2016).  Past Medical History:  GERD, HA  Constitutional:  BP 136/80 (BP Location: Right Wrist, Patient Position: Sitting, Cuff Size: Normal)   Pulse (!) 103   Temp 97.9 F (36.6 C) (Oral)   Ht  (1.6 m)   Wt (!) 317 lb 3.2 oz (143.9 kg)   SpO2 96%   BMI 56.19 kg/m   Brief Summary:  Ashley [name] is a 37 y.o. female smoker with obstructive sleep apnea and obesity hypoventilation syndrome.      Subjective:   Her sleep study from December showed very severe sleep apnea and low oxygen.  She was set up with Bipap and 3 liters.  Got a full face mask.  She feels Bipap setting is too high and she is unable to wear the mask.  She hasn't been using supplemental oxygen either.    She has been getting more issues with her stomach.  She has lost about 30 lbs since November 2023.  Physical Exam:   Appearance - well kempt   ENMT - no sinus tenderness, no oral exudate, no LAN, Mallampati 3 airway, no stridor  Respiratory - equal breath sounds bilaterally, no wheezing or rales  CV - s1s2 regular rate and rhythm, no murmurs  Ext - no clubbing, no edema  Skin - no rashes  Psych - normal mood and affect     Sleep Tests:  PSG 02/17/21 >> AHI 101.8, SpO2 low 42% Bipap titration 05/09/21 >> Bipap 26/20 cm H2O with rise time 300, ti max 1.5, ti min 0.8, trigger medium,cycle medium and 3 liters oxygen. PSG 06/05/22 >> AHI 114.1, SpO2 low 63%. Bipap 24/20 with 3 liters O2 >> AHI 4.5   Social History:  She  reports  that she quit smoking about 6 years ago. Her smoking use included cigarettes. She has never used smokeless tobacco. She reports that she does not drink alcohol and does not use drugs.  Family History:  Her family history includes Asthma in her mother; Cancer in her maternal grandmother; Early death in her mother; Hypertension in her mother.     Assessment/Plan:   Obstructive sleep apnea. - reviewed her sleep study results - discussed how untreated sleep apnea can impact her health - treatment options discussed - she uses [employer] for her DME - will change her Bipap to 16/12 cm H2O to see if she can adjust to using Bipap better, and then gradually increase pressure setting as needed based on her tolerance  Chronic respiratory failure with hypoxia from obesity hypoventilation syndrome. - continue 3 liters oxygen at night with Bipap for now  Obesity. - explained that her respiratory issues will improve if she is able to continue weight loss  Time Spent Involved in Patient Care on Day of Examination:  26 minutes  Follow up:   Patient Instructions  Will change your Bipap setting to 16/12 cm water pressure  Follow up in 2 months  Medication List:   Allergies as of 10/25/2022       Reactions   Fruit Extracts Itching   Fresh Fruit  Medication List        Accurate as of October 25, 2022 10:41 AM. If you have any questions, ask your nurse or doctor.          amLODipine 10 MG tablet Commonly known as: NORVASC Take 10 mg by mouth daily.   hydrochlorothiazide 25 MG tablet Commonly known as: HYDRODIURIL Take 25 mg by mouth daily.        Signature:  Coralyn Helling, MD [facility] Pulmonary/Critical Care Pager - [phone] 10/25/2022, 10:41 AM

## 2022-10-27 ENCOUNTER — Ambulatory Visit
Admission: RE | Admit: 2022-10-27 | Discharge: 2022-10-27 | Disposition: A | Discharge status: Home / Self Care | Payer: Medicaid Other | Source: Ambulatory Visit | Attending: Family Medicine | Admitting: Family Medicine

## 2022-10-27 DIAGNOSIS — R109 Unspecified abdominal pain: Secondary | ICD-10-CM

## 2022-10-31 DIAGNOSIS — G4733 Obstructive sleep apnea (adult) (pediatric): Secondary | ICD-10-CM | POA: Diagnosis not present

## 2022-10-31 DIAGNOSIS — J9611 Chronic respiratory failure with hypoxia: Secondary | ICD-10-CM | POA: Diagnosis not present

## 2022-11-23 DIAGNOSIS — G4733 Obstructive sleep apnea (adult) (pediatric): Secondary | ICD-10-CM | POA: Diagnosis not present

## 2022-11-30 DIAGNOSIS — J9611 Chronic respiratory failure with hypoxia: Secondary | ICD-10-CM | POA: Diagnosis not present

## 2022-11-30 DIAGNOSIS — G4733 Obstructive sleep apnea (adult) (pediatric): Secondary | ICD-10-CM | POA: Diagnosis not present

## 2022-12-24 DIAGNOSIS — G4733 Obstructive sleep apnea (adult) (pediatric): Secondary | ICD-10-CM | POA: Diagnosis not present

## 2022-12-26 ENCOUNTER — Ambulatory Visit (HOSPITAL_BASED_OUTPATIENT_CLINIC_OR_DEPARTMENT_OTHER): Payer: Medicaid Other | Admitting: Pulmonary Disease

## 2022-12-31 DIAGNOSIS — J9611 Chronic respiratory failure with hypoxia: Secondary | ICD-10-CM | POA: Diagnosis not present

## 2022-12-31 DIAGNOSIS — G4733 Obstructive sleep apnea (adult) (pediatric): Secondary | ICD-10-CM | POA: Diagnosis not present

## 2023-01-23 DIAGNOSIS — G4733 Obstructive sleep apnea (adult) (pediatric): Secondary | ICD-10-CM | POA: Diagnosis not present

## 2023-01-30 DIAGNOSIS — J9611 Chronic respiratory failure with hypoxia: Secondary | ICD-10-CM | POA: Diagnosis not present

## 2023-01-30 DIAGNOSIS — G4733 Obstructive sleep apnea (adult) (pediatric): Secondary | ICD-10-CM | POA: Diagnosis not present

## 2023-02-02 DIAGNOSIS — I10 Essential (primary) hypertension: Secondary | ICD-10-CM | POA: Diagnosis not present

## 2023-02-02 DIAGNOSIS — G4730 Sleep apnea, unspecified: Secondary | ICD-10-CM | POA: Diagnosis not present

## 2023-02-02 DIAGNOSIS — K581 Irritable bowel syndrome with constipation: Secondary | ICD-10-CM | POA: Diagnosis not present

## 2023-02-02 DIAGNOSIS — R109 Unspecified abdominal pain: Secondary | ICD-10-CM | POA: Diagnosis not present

## 2023-02-23 DIAGNOSIS — G4733 Obstructive sleep apnea (adult) (pediatric): Secondary | ICD-10-CM | POA: Diagnosis not present

## 2023-03-02 DIAGNOSIS — J9611 Chronic respiratory failure with hypoxia: Secondary | ICD-10-CM | POA: Diagnosis not present

## 2023-03-02 DIAGNOSIS — G4733 Obstructive sleep apnea (adult) (pediatric): Secondary | ICD-10-CM | POA: Diagnosis not present

## 2023-03-20 DIAGNOSIS — K581 Irritable bowel syndrome with constipation: Secondary | ICD-10-CM | POA: Diagnosis not present

## 2023-03-20 DIAGNOSIS — R109 Unspecified abdominal pain: Secondary | ICD-10-CM | POA: Diagnosis not present

## 2023-03-20 DIAGNOSIS — I10 Essential (primary) hypertension: Secondary | ICD-10-CM | POA: Diagnosis not present

## 2023-03-20 DIAGNOSIS — R350 Frequency of micturition: Secondary | ICD-10-CM | POA: Diagnosis not present

## 2023-03-20 DIAGNOSIS — G4730 Sleep apnea, unspecified: Secondary | ICD-10-CM | POA: Diagnosis not present

## 2023-03-26 DIAGNOSIS — G4733 Obstructive sleep apnea (adult) (pediatric): Secondary | ICD-10-CM | POA: Diagnosis not present

## 2023-04-02 DIAGNOSIS — G4733 Obstructive sleep apnea (adult) (pediatric): Secondary | ICD-10-CM | POA: Diagnosis not present

## 2023-04-02 DIAGNOSIS — J9611 Chronic respiratory failure with hypoxia: Secondary | ICD-10-CM | POA: Diagnosis not present

## 2023-04-04 ENCOUNTER — Emergency Department (HOSPITAL_COMMUNITY)
Admission: EM | Admit: 2023-04-04 | Discharge: 2023-04-05 | Disposition: A | Discharge status: Home / Self Care | Payer: Medicaid Other | Attending: Emergency Medicine | Admitting: Emergency Medicine

## 2023-04-04 ENCOUNTER — Other Ambulatory Visit: Payer: Self-pay

## 2023-04-04 ENCOUNTER — Encounter (HOSPITAL_COMMUNITY): Payer: Self-pay

## 2023-04-04 DIAGNOSIS — E876 Hypokalemia: Secondary | ICD-10-CM | POA: Insufficient documentation

## 2023-04-04 DIAGNOSIS — R1013 Epigastric pain: Secondary | ICD-10-CM | POA: Diagnosis present

## 2023-04-04 DIAGNOSIS — K529 Noninfective gastroenteritis and colitis, unspecified: Secondary | ICD-10-CM | POA: Insufficient documentation

## 2023-04-04 DIAGNOSIS — Z9049 Acquired absence of other specified parts of digestive tract: Secondary | ICD-10-CM | POA: Diagnosis not present

## 2023-04-04 DIAGNOSIS — R748 Abnormal levels of other serum enzymes: Secondary | ICD-10-CM | POA: Insufficient documentation

## 2023-04-04 DIAGNOSIS — K429 Umbilical hernia without obstruction or gangrene: Secondary | ICD-10-CM | POA: Diagnosis not present

## 2023-04-04 DIAGNOSIS — R1010 Upper abdominal pain, unspecified: Secondary | ICD-10-CM | POA: Diagnosis not present

## 2023-04-04 LAB — CBC
HCT: 37.7 % (ref 36.0–46.0)
Hemoglobin: 12.4 g/dL (ref 12.0–15.0)
MCH: 27.4 pg (ref 26.0–34.0)
MCHC: 32.9 g/dL (ref 30.0–36.0)
MCV: 83.4 fL (ref 80.0–100.0)
Platelets: 258 10*3/uL (ref 150–400)
RBC: 4.52 MIL/uL (ref 3.87–5.11)
RDW: 12.8 % (ref 11.5–15.5)
WBC: 7.6 10*3/uL (ref 4.0–10.5)
nRBC: 0 % (ref 0.0–0.2)

## 2023-04-04 LAB — COMPREHENSIVE METABOLIC PANEL
ALT: 12 U/L (ref 0–44)
AST: 12 U/L — ABNORMAL LOW (ref 15–41)
Albumin: 3 g/dL — ABNORMAL LOW (ref 3.5–5.0)
Alkaline Phosphatase: 60 U/L (ref 38–126)
Anion gap: 11 (ref 5–15)
BUN: 5 mg/dL — ABNORMAL LOW (ref 6–20)
CO2: 27 mmol/L (ref 22–32)
Calcium: 8.7 mg/dL — ABNORMAL LOW (ref 8.9–10.3)
Chloride: 98 mmol/L (ref 98–111)
Creatinine, Ser: 0.65 mg/dL (ref 0.44–1.00)
GFR, Estimated: >60 mL/min (ref >60)
Glucose, Bld: 93 mg/dL (ref 70–99)
Potassium: 3.3 mmol/L — ABNORMAL LOW (ref 3.5–5.1)
Sodium: 136 mmol/L (ref 135–145)
Total Bilirubin: 0.7 mg/dL (ref 0.3–1.2)
Total Protein: 6.7 g/dL (ref 6.5–8.1)

## 2023-04-04 LAB — URINALYSIS, ROUTINE W REFLEX MICROSCOPIC
Bilirubin Urine: NEGATIVE
Glucose, UA: NEGATIVE mg/dL
Hgb urine dipstick: NEGATIVE
Ketones, ur: NEGATIVE mg/dL
Leukocytes,Ua: NEGATIVE
Nitrite: NEGATIVE
Protein, ur: NEGATIVE mg/dL
Specific Gravity, Urine: 1.013 (ref 1.005–1.030)
pH: 7 (ref 5.0–8.0)

## 2023-04-04 LAB — HCG, SERUM, QUALITATIVE: Preg, Serum: NEGATIVE

## 2023-04-04 LAB — LIPASE, BLOOD: Lipase: 102 U/L — ABNORMAL HIGH (ref 11–51)

## 2023-04-04 MED ORDER — FENTANYL CITRATE PF 50 MCG/ML IJ SOSY
50.0000 ug | PREFILLED_SYRINGE | Freq: Once | INTRAMUSCULAR | Status: AC
  Administered 2023-04-05: 50 ug via INTRAVENOUS
  Filled 2023-04-04: qty 1

## 2023-04-04 MED ORDER — SODIUM CHLORIDE 0.9 % IV BOLUS
1000.0000 mL | Freq: Once | INTRAVENOUS | Status: AC
  Administered 2023-04-05: 1000 mL via INTRAVENOUS

## 2023-04-04 MED ORDER — ONDANSETRON HCL 4 MG/2ML IJ SOLN
4.0000 mg | Freq: Once | INTRAMUSCULAR | Status: AC
  Administered 2023-04-05: 4 mg via INTRAVENOUS
  Filled 2023-04-04: qty 2

## 2023-04-04 NOTE — ED Triage Notes (Signed)
Pt came in via POV d/t upper-mid abd pain, constipation intermittently the last few months, a bad "coppery" taste in her mouth & emesis that happened when she tries to use the bathroom at time it wont come out. A/Ox4, rates her pain 10/10 while in triage.

## 2023-04-05 ENCOUNTER — Emergency Department (HOSPITAL_COMMUNITY): Payer: Medicaid Other

## 2023-04-05 DIAGNOSIS — R1010 Upper abdominal pain, unspecified: Secondary | ICD-10-CM | POA: Diagnosis not present

## 2023-04-05 DIAGNOSIS — K429 Umbilical hernia without obstruction or gangrene: Secondary | ICD-10-CM | POA: Diagnosis not present

## 2023-04-05 DIAGNOSIS — Z9049 Acquired absence of other specified parts of digestive tract: Secondary | ICD-10-CM | POA: Diagnosis not present

## 2023-04-05 MED ORDER — IOHEXOL 350 MG/ML SOLN
75.0000 mL | Freq: Once | INTRAVENOUS | Status: AC | PRN
  Administered 2023-04-05: 75 mL via INTRAVENOUS

## 2023-04-05 MED ORDER — ONDANSETRON 4 MG PO TBDP: 4.0000 mg | ORAL_TABLET | Freq: Three times a day (TID) | ORAL | 0 refills | Status: DC | PRN

## 2023-04-05 MED ORDER — POLYETHYLENE GLYCOL 3350 17 GM/SCOOP PO POWD: 1.0000 | Freq: Once | ORAL | 0 refills | Status: AC

## 2023-04-05 NOTE — Discharge Instructions (Addendum)
You are seen the emergency department abdominal pain.  Your labs and imaging were thankfully reassuring however there is evidence of colitis on your left side which may be causing some of your symptoms.  I would recommend improving bowel regimen health such as increasing fiber and water intake as well as daily MiraLAX.  I have sent a prescription for Zofran to your pharmacy.  If you have any worsening or symptoms, return to the emergency department.  Otherwise follow-up with your primary care provider.

## 2023-04-05 NOTE — ED Provider Notes (Incomplete)
  [place] EMERGENCY DEPARTMENT AT [facility] Provider Note   CSN: [id number] Arrival date & time: 04/04/23  1615     History Chief Complaint  Patient presents with  . Abdominal Pain  . Constipation  . Nausea  . Emesis    Ashley [name] is a 37 y.o. female.   Abdominal Pain Associated symptoms: constipation and vomiting   Constipation Associated symptoms: abdominal pain and vomiting   Emesis Associated symptoms: abdominal pain        Home Medications Prior to Admission medications   Medication Sig Start Date End Date Taking? Authorizing Provider  amLODipine (NORVASC) 10 MG tablet Take 10 mg by mouth daily. Patient not taking: Reported on 05/12/2022    [provider]  hydrochlorothiazide (HYDRODIURIL) 25 MG tablet Take 25 mg by mouth daily. Patient not taking: Reported on 05/12/2022    [provider]      Allergies    Fruit extracts    Review of Systems   Review of Systems  Gastrointestinal:  Positive for abdominal pain, constipation and vomiting.    Physical Exam Updated Vital Signs BP (!) 141/86 (BP Location: Right Arm)   Pulse 74   Temp 98.5 F (36.9 C)   Resp 18   Ht 5\' 3"  (1.6 m)   Wt 132.5 kg   SpO2 100%   BMI 51.73 kg/m  Physical Exam  ED Results / Procedures / Treatments   Labs (all labs ordered are listed, but only abnormal results are displayed) Labs Reviewed  LIPASE, BLOOD - Abnormal; Notable for the following components:      Result Value   Lipase 102 (*)    All other components within normal limits  COMPREHENSIVE METABOLIC PANEL - Abnormal; Notable for the following components:   Potassium 3.3 (*)    BUN 5 (*)    Calcium 8.7 (*)    Albumin 3.0 (*)    AST 12 (*)    All other components within normal limits  CBC  URINALYSIS, ROUTINE W REFLEX MICROSCOPIC  HCG, SERUM, QUALITATIVE    EKG None  Radiology No results found.  Procedures Procedures  {Document cardiac monitor, telemetry  assessment procedure when appropriate:1}  Medications Ordered in ED Medications  sodium chloride 0.9 % bolus 1,000 mL (has no administration in time range)  fentaNYL (SUBLIMAZE) injection 50 mcg (has no administration in time range)  ondansetron (ZOFRAN) injection 4 mg (has no administration in time range)    ED Course/ Medical Decision Making/ A&P   {   Click here for ABCD2, HEART and other calculatorsREFRESH Note before signing :1}                              Medical Decision Making Amount and/or Complexity of Data Reviewed Labs: ordered. Radiology: ordered.  Risk Prescription drug management.   ***  {Document critical care time when appropriate:1} {Document review of labs and clinical decision tools ie heart score, Chads2Vasc2 etc:1}  {Document your independent review of radiology images, and any outside records:1} {Document your discussion with family members, caretakers, and with consultants:1} {Document social determinants of health affecting pt's care:1} {Document your decision making why or why not admission, treatments were needed:1} Final Clinical Impression(s) / ED Diagnoses Final diagnoses:  None    Rx / DC Orders ED Discharge Orders     None

## 2023-04-05 NOTE — ED Provider Notes (Signed)
[place] EMERGENCY DEPARTMENT AT [facility] Provider Note   CSN: [id number] Arrival date & time: 04/04/23  1615     History Chief Complaint  Patient presents with   Abdominal Pain   Constipation   Nausea   Emesis    Ashley [name] is a 37 y.o. female.  Patient with past history significant for obesity, irregular menstrual cycles presents emergency department for abdominal pain.  Reports she has been experiencing epigastric pain for several months but worsened over the last few days.  Also endorsing a "coppery" taste.  Concerned experiencing associated nausea, vomiting when she tries to do bowel movements strain.  She says she was previously taking fiber but no other medications for constipation.  Denies any fevers, or urinary symptoms.   Abdominal Pain Associated symptoms: constipation and vomiting   Constipation Associated symptoms: abdominal pain and vomiting   Emesis Associated symptoms: abdominal pain        Home Medications Prior to Admission medications   Medication Sig Start Date End Date Taking? Authorizing Provider  ondansetron (ZOFRAN-ODT) 4 MG disintegrating tablet Take 1 tablet (4 mg total) by mouth every 8 (eight) hours as needed for nausea or vomiting. 04/05/23  Yes Maryanna Shape A, PA-C  amLODipine (NORVASC) 10 MG tablet Take 10 mg by mouth daily. Patient not taking: Reported on 05/12/2022    [provider]  hydrochlorothiazide (HYDRODIURIL) 25 MG tablet Take 25 mg by mouth daily. Patient not taking: Reported on 05/12/2022    [provider]      Allergies    Fruit extracts    Review of Systems   Review of Systems  Gastrointestinal:  Positive for abdominal pain, constipation and vomiting.  All other systems reviewed and are negative.   Physical Exam Updated Vital Signs BP (!) 163/110   Pulse 72   Temp 98.6 F (37 C) (Oral)   Resp 20   Ht 5\' 3"  (1.6 m)   Wt 132.5 kg   SpO2 99%   BMI 51.73 kg/m  Physical  Exam Vitals and nursing note reviewed.  Constitutional:      General: She is not in acute distress.    Appearance: She is well-developed.  HENT:     Head: Normocephalic and atraumatic.  Eyes:     Conjunctiva/sclera: Conjunctivae normal.  Cardiovascular:     Rate and Rhythm: Normal rate and regular rhythm.     Heart sounds: No murmur heard. Pulmonary:     Effort: Pulmonary effort is normal. No respiratory distress.     Breath sounds: Normal breath sounds.  Abdominal:     Palpations: Abdomen is soft.     Tenderness: There is abdominal tenderness in the epigastric area and left upper quadrant.  Musculoskeletal:        General: No swelling.     Cervical back: Neck supple.  Skin:    General: Skin is warm and dry.     Capillary Refill: Capillary refill takes less than 2 seconds.  Neurological:     Mental Status: She is alert.  Psychiatric:        Mood and Affect: Mood normal.     ED Results / Procedures / Treatments   Labs (all labs ordered are listed, but only abnormal results are displayed) Labs Reviewed  LIPASE, BLOOD - Abnormal; Notable for the following components:      Result Value   Lipase 102 (*)    All other components within normal limits  COMPREHENSIVE METABOLIC PANEL -  Abnormal; Notable for the following components:   Potassium 3.3 (*)    BUN 5 (*)    Calcium 8.7 (*)    Albumin 3.0 (*)    AST 12 (*)    All other components within normal limits  CBC  URINALYSIS, ROUTINE W REFLEX MICROSCOPIC  HCG, SERUM, QUALITATIVE    EKG None  Radiology No results found.  Procedures Procedures   Medications Ordered in ED Medications  sodium chloride 0.9 % bolus 1,000 mL (0 mLs Intravenous Stopped 04/05/23 0354)  fentaNYL (SUBLIMAZE) injection 50 mcg (50 mcg Intravenous Given 04/05/23 0014)  ondansetron (ZOFRAN) injection 4 mg (4 mg Intravenous Given 04/05/23 0014)  iohexol (OMNIPAQUE) 350 MG/ML injection 75 mL (75 mLs Intravenous Contrast Given 04/05/23 0042)     ED Course/ Medical Decision Making/ A&P                               Medical Decision Making Amount and/or Complexity of Data Reviewed Labs: ordered. Radiology: ordered.  Risk Prescription drug management.   This patient presents to the ED for concern of abdominal pain.  Differential diagnosis includes constipation, bowel obstruction, pancreatitis, diverticulitis, pyelonephritis   Lab Tests:  I Ordered, and personally interpreted labs.  The pertinent results include: CBC is unremarkable, CMP with mild hypokalemia 3.3 otherwise unremarkable, serum hCG negative, lipase slightly elevated at 102, UA without evidence of infection   Imaging Studies ordered:  I ordered imaging studies including CT abdomen pelvis I independently visualized and interpreted imaging which showed colitis noted in the descending colon but no other acute abnormalities I agree with the radiologist interpretation   Medicines ordered and prescription drug management:  I ordered medication including fluids, fentanyl, Zofran for dehydration, pain, nausea Reevaluation of the patient after these medicines showed that the patient improved I have reviewed the patients home medicines and have made adjustments as needed   Problem List / ED Course:  Patient with past history significant for obesity and irregular menstrual cycles presents to the ED with concerns of abdominal pain. States that this pain has been present off and on for the last few months, but worsened in the past few days. Also reports associated "copper" taste in month and vomiting and she tries to have a bowel movement. Stated that pain was 10 out of 10 while in triage. Currently on initial assessment, resting comfortably. Exam concerning for pain to palpation in the epigastrium and LUQ. No appreciable mass or fluid wave noted. Basic labs ordered for evaluation of symptoms including CBC, CMP,  hcg, UA, and lipase. Fluids, zofran, and fentanyl  ordered for symptom control. Labs largely unremarkable with no leukocytosis noted and CMP showing only mild hypokalemia and no elevation in liver enzymes. Hypokalemia likely secondary to GI loss. Lipase, however, slightly elevated at 102. No history of chronic pancreatitis so doubt early acute pancreatitis. Will obtain CT imaging given level of discomfort. Some improvement in symptoms with medications. CT imaging shows evidence of colitis in the descending colon with some aeration noted. Likely source of pain. No CT imaging findings consistent with pancreatitis. Lipase elevation likely secondary to vomiting. Will trial PO challenge and discuss outpatient management on condition with bowel rest and PCP follow up. Patient tolerating PO without difficulty. Discussed treatment plan including diet modification for bowel rest. Patient agreeable with plan for outpatient PCP follow up and likely need for GI evaluation if symptoms not improving. No acute indication for further  workup or admission at this time. Discussed strict return precautions. Patient discharged home in stable condition.  Final Clinical Impression(s) / ED Diagnoses Final diagnoses:  Colitis  Epigastric pain    Rx / DC Orders ED Discharge Orders          Ordered    ondansetron (ZOFRAN-ODT) 4 MG disintegrating tablet  Every 8 hours PRN        04/05/23 0314    polyethylene glycol powder (GLYCOLAX/MIRALAX) 17 GM/SCOOP powder   Once        04/05/23 0314              Smitty Knudsen, PA-C 04/12/23 1004    Horton, Mayer Masker, MD 04/13/23 [phone]

## 2023-04-12 DIAGNOSIS — G4730 Sleep apnea, unspecified: Secondary | ICD-10-CM | POA: Diagnosis not present

## 2023-04-12 DIAGNOSIS — K581 Irritable bowel syndrome with constipation: Secondary | ICD-10-CM | POA: Diagnosis not present

## 2023-04-12 DIAGNOSIS — I10 Essential (primary) hypertension: Secondary | ICD-10-CM | POA: Diagnosis not present

## 2023-04-12 DIAGNOSIS — Z87891 Personal history of nicotine dependence: Secondary | ICD-10-CM | POA: Diagnosis not present

## 2023-05-02 DIAGNOSIS — J9611 Chronic respiratory failure with hypoxia: Secondary | ICD-10-CM | POA: Diagnosis not present

## 2023-05-02 DIAGNOSIS — G4733 Obstructive sleep apnea (adult) (pediatric): Secondary | ICD-10-CM | POA: Diagnosis not present

## 2023-05-26 DIAGNOSIS — G4733 Obstructive sleep apnea (adult) (pediatric): Secondary | ICD-10-CM | POA: Diagnosis not present

## 2023-06-02 DIAGNOSIS — J9611 Chronic respiratory failure with hypoxia: Secondary | ICD-10-CM | POA: Diagnosis not present

## 2023-06-02 DIAGNOSIS — G4733 Obstructive sleep apnea (adult) (pediatric): Secondary | ICD-10-CM | POA: Diagnosis not present

## 2023-07-02 DIAGNOSIS — G4733 Obstructive sleep apnea (adult) (pediatric): Secondary | ICD-10-CM | POA: Diagnosis not present

## 2023-07-02 DIAGNOSIS — J9611 Chronic respiratory failure with hypoxia: Secondary | ICD-10-CM | POA: Diagnosis not present

## 2023-07-17 NOTE — Progress Notes (Signed)
 07/18/2023 Ashley [name] Ellen [id number] Apr 11, 1986  Referring provider: [name], MD Primary GI doctor: Dr. Charlanne  ASSESSMENT AND PLAN:     Colitis mid descending thickening Chronic constipation with soft stools, left upper abdominal pain, and occasional blood in stool. Previous diagnosis of colitis in the ER. Symptoms have improved recently but still present. Very concerning for IBD with associated weight loss, nausea vomiting and what sounds like tenesmus. -Schedule colonoscopy and endoscopy to evaluate at [facility] with Dr. Charlanne patient should be appropriate now with her weight loss. -Order labs to check for inflammatory markers. -Prescribe as-needed Levsin  for abdominal spasms. -FODMAP given -If colonoscopy negative likely colitis was infectious with resolution of symptoms, may need to consider pelvic floor dysfunction is contributing to symptoms  Enlarged Thyroid  Noted on physical exam, no symptoms of dysphagia or compressive symptoms, some irregularity to it could be benign goiter versus rule out malignancy -Order thyroid  function tests. -Will order ultrasound to not delay care but will need to follow-up with primary care will send note to PCP.    Weight Loss Significant weight loss of approximately 60 pounds over the past year due to decreased appetite and dietary changes related to abdominal symptoms. -Monitor weight and nutritional status.  Sleep Apnea Improvement in symptoms with weight loss, no longer using CPAP machine. -Continue monitoring.  Acid Reflux History of acid reflux, no current treatment. -With nausea and weight loss we will also plan for endoscopy with colonoscopy to evaluate further.  Follow-up -Plan for follow-up with primary care provider for thyroid  ultrasound results and ongoing management. -Plan for follow-up after colonoscopy and endoscopy to discuss results and treatment plan.  -Risk of bowel prep, conscious sedation, and EGD and colonoscopy  were discussed.  Risks include but are not limited to dehydration, pain, bleeding, cardiopulmonary process, bowel perforation, or other possible adverse outcomes..  Treatment plan was discussed with patient, and agreed upon.  Patient Care Team: [name], MD as PCP - General (Family Medicine)  HISTORY OF PRESENT ILLNESS: 38 y.o. female with a past medical history of obesity, OSA on BiPAP chronic respiratory failure with hypoxia from obesity hypoventilation syndrome on 3 L oxygen  at night with BiPAP, hypertension, hyperlipidemia, status post cholecystectomy, tobacco use and others listed below presents for evaluation of colitis.   04/2023 ER visit for abdominal pain with associated nausea and vomiting and constipation. CT showed focal wall thickening mid descending colon consistent with colitis.  CBC without anemia or leukocytosis albumin  3.0, potassium 3.3 otherwise kidney and liver unremarkable negative hCG lipase 102 patient was given ondansetron  and MiraLAX   Discussed the use of AI scribe software for clinical note transcription with the patient, who gave verbal consent to proceed.  History of Present Illness   The patient, previously diagnosed with colitis, presents with ongoing gastrointestinal symptoms that have persisted for over three months. She reports experiencing constipation, with stools described as soft and infrequent, occurring every few days. The patient also describes a sensation of needing to defecate but being unable to do so, which was particularly notable the day prior to the consultation.  Accompanying these symptoms is abdominal pain, localized to the left upper quadrant, which the patient reports as being alleviated post-defecation. Notably, the patient has observed small amounts of orange-red blood in her stool.  In addition to these symptoms, the patient has experienced nausea and vomiting, which she attributes to the consumption of colored drinks. As a result, she  has significantly reduced her food and drink intake,  leading to a substantial weight loss of approximately 60 pounds since November of the previous year.  The patient has a family history of colon cancer, with her grandmother having been diagnosed with the disease. The patient notes improvements in her sleep and breathing since her weight loss, no longer requiring a sleep apnea machine or supplemental oxygen .  Patient denies chest pain, shortness of breath. She has not had her thyroid  evaluated, no family history of thyroid  cancer, autoimmune, palpitations.      She  reports that she quit smoking about 13 years ago. Her smoking use included cigarettes. She has never used smokeless tobacco. She reports that she does not drink alcohol and does not use drugs.  RELEVANT LABS AND IMAGING:  04/04/2023 CT AB and pelvis with contrast IMPRESSION: Focal wall thickening in the mid descending colon consistent with colitis. Aeration is noted.   CBC    Component Value Date/Time   WBC 7.6 04/04/2023 1653   RBC 4.52 04/04/2023 1653   HGB 12.4 04/04/2023 1653   HGB 10.9 (L) 06/02/2016 1105   HCT 37.7 04/04/2023 1653   HCT 33.9 (L) 06/02/2016 1105   PLT 258 04/04/2023 1653   PLT 189 06/02/2016 1105   MCV 83.4 04/04/2023 1653   MCV 80 06/02/2016 1105   MCH 27.4 04/04/2023 1653   MCHC 32.9 04/04/2023 1653   RDW 12.8 04/04/2023 1653   RDW 15.4 06/02/2016 1105   LYMPHSABS 2.1 01/27/2016 1545   MONOABS 0.6 06/23/2015 1030   EOSABS 0.2 01/27/2016 1545   BASOSABS 0.0 01/27/2016 1545   Recent Labs    04/04/23 1653  HGB 12.4    CMP     Component Value Date/Time   NA 136 04/04/2023 1653   K 3.3 (L) 04/04/2023 1653   CL 98 04/04/2023 1653   CO2 27 04/04/2023 1653   GLUCOSE 93 04/04/2023 1653   BUN 5 (L) 04/04/2023 1653   CREATININE 0.65 04/04/2023 1653   CALCIUM  8.7 (L) 04/04/2023 1653   PROT 6.7 04/04/2023 1653   ALBUMIN  3.0 (L) 04/04/2023 1653   AST 12 (L) 04/04/2023 1653   ALT 12  04/04/2023 1653   ALKPHOS 60 04/04/2023 1653   BILITOT 0.7 04/04/2023 1653   GFRNONAA >60 04/04/2023 1653   GFRAA >90 08/28/2014 1950      Latest Ref Rng & Units 04/04/2023    4:53 PM 08/28/2014    7:50 PM 07/08/2014    2:29 PM  Hepatic Function  Total Protein 6.5 - 8.1 g/dL 6.7  7.0  7.1   Albumin  3.5 - 5.0 g/dL 3.0  3.3  3.4   AST 15 - 41 U/L 12  20  13    ALT 0 - 44 U/L 12  18  17    Alk Phosphatase 38 - 126 U/L 60  46  57   Total Bilirubin 0.3 - 1.2 mg/dL 0.7  0.8  0.4       Current Medications:    Current Outpatient Medications (Cardiovascular):    amLODipine (NORVASC) 10 MG tablet, Take 10 mg by mouth daily. (Patient not taking: Reported on 07/18/2023)   hydrochlorothiazide (HYDRODIURIL) 25 MG tablet, Take 25 mg by mouth daily. (Patient not taking: Reported on 07/18/2023)   Current Outpatient Medications (Analgesics):    ibuprofen  (ADVIL ) 800 MG tablet, Take 800 mg by mouth 3 (three) times daily.   meloxicam (MOBIC) 15 MG tablet, Take 15 mg by mouth daily.   Current Outpatient Medications (Other):    hyoscyamine  (LEVSIN  SL)  0.125 MG SL tablet, Place 1 tablet (0.125 mg total) under the tongue every 6 (six) hours as needed for cramping (nausea, diarrhea).   Na Sulfate-K Sulfate-Mg Sulf 17.5-3.13-1.6 GM/177ML SOLN, Take 1 kit by mouth once for 1 dose.   ondansetron  (ZOFRAN -ODT) 4 MG disintegrating tablet, Take 1 tablet (4 mg total) by mouth every 8 (eight) hours as needed for nausea or vomiting. 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   Irregular periods/menstrual cycles 01/30/2012   Late prenatal care 01/30/2012   Poor social situation 01/30/2012   Previous cesarean section complicating pregnancy, antepartum condition or complication 01/27/2016   2 previous cesarean section- Desires  repeat with BTL   Single liveborn, born in hospital, delivered by cesarean delivery 08/13/2016   Status post repeat low transverse cesarean section 08/14/2016   Status post tubal ligation at time of delivery, current hosp 08/14/2016   Allergies:  Allergies  Allergen Reactions   Fruit Extracts Itching    Fresh Fruit      Surgical History:  She  has a past surgical history that includes Cesarean section (2009); Tonsillectomy (AGE 55); Tonsillectomy and adenoidectomy (AGEB 18); Cesarean section (01/30/2012); Cholecystectomy (N/A, 10/16/2013); Dilation and evacuation (N/A, 07/28/2015); and Cesarean section with bilateral tubal ligation (Bilateral, 08/13/2016). Family History:  Her family history includes Asthma in her mother; Cancer in her maternal grandmother; Early death in her mother; Hypertension in her mother.  REVIEW OF SYSTEMS  : All other systems reviewed and negative except where noted in the History of Present Illness.  PHYSICAL EXAM: BP 130/68   Pulse 80   Ht 5' 3 (1.6 m)   Wt 264 lb (119.7 kg)   BMI 46.77 kg/m  General Appearance: Obese, in no apparent distress. Head:   Normocephalic and atraumatic.  Thyroid  gland diffusely enlarged, possible right greater than left no distinct nodules, nontender. Eyes:  sclerae anicteric,conjunctive pink  Respiratory: Respiratory effort normal, BS equal bilaterally without rales, rhonchi, wheezing. Cardio: RRR with no MRGs. Peripheral pulses intact.  Abdomen: Soft,  Obese ,active bowel sounds. mild tenderness in the LUQ. Without guarding and Without rebound. No masses. Rectal: declines Musculoskeletal: Full ROM, Normal gait. Without edema. Skin:  Dry and intact without significant lesions or rashes Neuro: Alert and  oriented x4;  No focal deficits. Psych:  Cooperative. Normal mood and affect.    Alan JONELLE Coombs, PA-C 9:53 AM

## 2023-07-18 ENCOUNTER — Ambulatory Visit: Payer: Medicaid Other | Admitting: Physician Assistant

## 2023-07-18 ENCOUNTER — Other Ambulatory Visit: Payer: Medicaid Other

## 2023-07-18 ENCOUNTER — Encounter: Payer: Self-pay | Admitting: Physician Assistant

## 2023-07-18 ENCOUNTER — Other Ambulatory Visit: Payer: Self-pay

## 2023-07-18 VITALS — BP 130/68 | HR 80 | Ht 63.0 in | Wt 264.0 lb

## 2023-07-18 DIAGNOSIS — K529 Noninfective gastroenteritis and colitis, unspecified: Secondary | ICD-10-CM

## 2023-07-18 DIAGNOSIS — R634 Abnormal weight loss: Secondary | ICD-10-CM | POA: Diagnosis not present

## 2023-07-18 DIAGNOSIS — E0590 Thyrotoxicosis, unspecified without thyrotoxic crisis or storm: Secondary | ICD-10-CM

## 2023-07-18 DIAGNOSIS — D649 Anemia, unspecified: Secondary | ICD-10-CM | POA: Diagnosis not present

## 2023-07-18 DIAGNOSIS — K5909 Other constipation: Secondary | ICD-10-CM | POA: Diagnosis not present

## 2023-07-18 DIAGNOSIS — G4730 Sleep apnea, unspecified: Secondary | ICD-10-CM

## 2023-07-18 DIAGNOSIS — E66813 Obesity, class 3: Secondary | ICD-10-CM

## 2023-07-18 DIAGNOSIS — E049 Nontoxic goiter, unspecified: Secondary | ICD-10-CM

## 2023-07-18 DIAGNOSIS — R112 Nausea with vomiting, unspecified: Secondary | ICD-10-CM | POA: Diagnosis not present

## 2023-07-18 DIAGNOSIS — K219 Gastro-esophageal reflux disease without esophagitis: Secondary | ICD-10-CM

## 2023-07-18 DIAGNOSIS — R1012 Left upper quadrant pain: Secondary | ICD-10-CM

## 2023-07-18 DIAGNOSIS — J9611 Chronic respiratory failure with hypoxia: Secondary | ICD-10-CM

## 2023-07-18 DIAGNOSIS — G4733 Obstructive sleep apnea (adult) (pediatric): Secondary | ICD-10-CM

## 2023-07-18 LAB — CBC WITH DIFFERENTIAL/PLATELET
Basophils Absolute: 0 10*3/uL (ref 0.0–0.1)
Basophils Relative: 0.4 % (ref 0.0–3.0)
Eosinophils Absolute: 0.1 10*3/uL (ref 0.0–0.7)
Eosinophils Relative: 2.2 % (ref 0.0–5.0)
HCT: 34.5 % — ABNORMAL LOW (ref 36.0–46.0)
Hemoglobin: 11.3 g/dL — ABNORMAL LOW (ref 12.0–15.0)
Lymphocytes Relative: 26 % (ref 12.0–46.0)
Lymphs Abs: 1.4 10*3/uL (ref 0.7–4.0)
MCHC: 32.8 g/dL (ref 30.0–36.0)
MCV: 79 fL (ref 78.0–100.0)
Monocytes Absolute: 0.4 10*3/uL (ref 0.1–1.0)
Monocytes Relative: 6.9 % (ref 3.0–12.0)
Neutro Abs: 3.4 10*3/uL (ref 1.4–7.7)
Neutrophils Relative %: 64.5 % (ref 43.0–77.0)
Platelets: 238 10*3/uL (ref 150.0–400.0)
RBC: 4.37 Mil/uL (ref 3.87–5.11)
RDW: 15.7 % — ABNORMAL HIGH (ref 11.5–15.5)
WBC: 5.3 10*3/uL (ref 4.0–10.5)

## 2023-07-18 LAB — VITAMIN B12: Vitamin B-12: 434 pg/mL (ref 211–911)

## 2023-07-18 LAB — TSH: TSH: 0.19 u[IU]/mL — ABNORMAL LOW (ref 0.35–5.50)

## 2023-07-18 LAB — COMPREHENSIVE METABOLIC PANEL
ALT: 7 U/L (ref 0–35)
AST: 7 U/L (ref 0–37)
Albumin: 3.8 g/dL (ref 3.5–5.2)
Alkaline Phosphatase: 66 U/L (ref 39–117)
BUN: 8 mg/dL (ref 6–23)
CO2: 29 meq/L (ref 19–32)
Calcium: 8.6 mg/dL (ref 8.4–10.5)
Chloride: 103 meq/L (ref 96–112)
Creatinine, Ser: 0.58 mg/dL (ref 0.40–1.20)
GFR: 113.77 mL/min (ref >60.00)
Glucose, Bld: 80 mg/dL (ref 70–99)
Potassium: 3.8 meq/L (ref 3.5–5.1)
Sodium: 139 meq/L (ref 135–145)
Total Bilirubin: 0.4 mg/dL (ref 0.2–1.2)
Total Protein: 7.2 g/dL (ref 6.0–8.3)

## 2023-07-18 LAB — HIGH SENSITIVITY CRP: CRP, High Sensitivity: 26.16 mg/L — ABNORMAL HIGH (ref 0.000–5.000)

## 2023-07-18 LAB — FERRITIN: Ferritin: 40.5 ng/mL (ref 10.0–291.0)

## 2023-07-18 LAB — IRON: Iron: 33 ug/dL — ABNORMAL LOW (ref 42–145)

## 2023-07-18 LAB — SEDIMENTATION RATE: Sed Rate: 81 mm/h — ABNORMAL HIGH (ref 0–20)

## 2023-07-18 MED ORDER — NA SULFATE-K SULFATE-MG SULF 17.5-3.13-1.6 GM/177ML PO SOLN: 1.0000 | Freq: Once | ORAL | 0 refills | Status: AC

## 2023-07-18 MED ORDER — HYOSCYAMINE SULFATE 0.125 MG SL SUBL: 0.1250 mg | SUBLINGUAL_TABLET | Freq: Four times a day (QID) | SUBLINGUAL | 1 refills | Status: DC | PRN

## 2023-07-18 NOTE — Patient Instructions (Addendum)
 Your provider has requested that you go to the basement level for lab work before leaving today. Press B on the elevator. The lab is located at the first door on the left as you exit the elevator.  FOLLOW UP WITH PCP ABOUT YOUR THYROID , will need to follow up with PCP  First do a trial off milk/lactose products if you use them.  Add fiber like benefiber or citracel once a day Increase activity Can do trial of IBGard which is over the counter for AB pain- Take 1-2 capsules once a day for maintence or twice a day during a flare Can send in an anti spasm medication, Levsin , to take as needed    FODMAP stands for fermentable oligo-, di-, mono-saccharides and polyols (1). These are the scientific terms used to classify groups of carbs that are difficult for our body to digest and that are notorious for triggering digestive symptoms like bloating, gas, loose stools and stomach pain.   You can try low FODMAP diet  - start with eliminating just one column at a time that you feel may be a trigger for you. - the table at the very bottom contains foods that are low in FODMAPs   Sometimes trying to eliminate the FODMAP's from your diet is difficult or tricky, if you are stuggling with trying to do the elimination diet you can try an enzyme.  There is a food enzymes that you sprinkle in or on your food that helps break down the FODMAP. You can read more about the enzyme by going to this site: https://fodzyme.com/    You will be contacted by [facility] Scheduling in the next 2 days to arrange a Thyroid  Ultrasound.  The number on your caller ID will be [phone], please answer when they call.  If you have not heard from them in 2 days please call [phone] to schedule.     Due to recent changes in healthcare laws, you may see the results of your imaging and laboratory studies on MyChart before your provider has had a chance to review them.  We understand that in some cases there may be  results that are confusing or concerning to you. Not all laboratory results come back in the same time frame and the provider may be waiting for multiple results in order to interpret others.  Please give us  48 hours in order for your provider to thoroughly review all the results before contacting the office for clarification of your results.    You have been scheduled for an endoscopy and colonoscopy. Please follow the written instructions given to you at your visit today.  Please pick up your prep supplies at the pharmacy within the next 1-3 days.  If you use inhalers (even only as needed), please bring them with you on the day of your procedure.  DO NOT TAKE 7 DAYS PRIOR TO TEST- Trulicity (dulaglutide) Ozempic, Wegovy (semaglutide) Mounjaro (tirzepatide) Bydureon Bcise (exanatide extended release)  DO NOT TAKE 1 DAY PRIOR TO YOUR TEST Rybelsus (semaglutide) Adlyxin (lixisenatide) Victoza (liraglutide) Byetta (exanatide) ___________________________________________________________________________   I appreciate the  opportunity to care for you  Thank You   [facility]

## 2023-07-18 NOTE — Progress Notes (Signed)
 Marland Kitchen

## 2023-07-19 LAB — TISSUE TRANSGLUTAMINASE, IGA: (tTG) Ab, IgA: <1 U/mL

## 2023-07-19 LAB — IGA: Immunoglobulin A: 280 mg/dL (ref 47–310)

## 2023-07-27 ENCOUNTER — Telehealth: Payer: Self-pay | Admitting: Physician Assistant

## 2023-07-28 ENCOUNTER — Other Ambulatory Visit: Payer: Self-pay

## 2023-07-28 ENCOUNTER — Telehealth: Payer: Self-pay

## 2023-07-28 ENCOUNTER — Telehealth: Payer: Self-pay | Admitting: Gastroenterology

## 2023-07-28 ENCOUNTER — Ambulatory Visit (HOSPITAL_COMMUNITY)
Admission: RE | Admit: 2023-07-28 | Discharge: 2023-07-28 | Disposition: A | Discharge status: Home / Self Care | Payer: Medicaid Other | Source: Ambulatory Visit | Attending: Physician Assistant | Admitting: Physician Assistant

## 2023-07-28 DIAGNOSIS — K529 Noninfective gastroenteritis and colitis, unspecified: Secondary | ICD-10-CM

## 2023-07-28 DIAGNOSIS — E049 Nontoxic goiter, unspecified: Secondary | ICD-10-CM | POA: Insufficient documentation

## 2023-07-28 DIAGNOSIS — K5900 Constipation, unspecified: Secondary | ICD-10-CM

## 2023-07-28 DIAGNOSIS — R109 Unspecified abdominal pain: Secondary | ICD-10-CM

## 2023-07-28 DIAGNOSIS — E0789 Other specified disorders of thyroid: Secondary | ICD-10-CM | POA: Diagnosis not present

## 2023-07-28 NOTE — Telephone Encounter (Signed)
Patient called and stated that she is needing to reschedule her hospital procedure for a Friday due to her not having anyone that can stay with her on any other day. Patient is requesting a call back. Please advise.

## 2023-07-28 NOTE — Telephone Encounter (Signed)
Patient scheduled for Endo/colon at [facility] 09/11/23 at 7:30am. Left detailed message on pts voicemail. Updated instructions sent to pt via mychart. Amb ref in epic.

## 2023-07-28 NOTE — Telephone Encounter (Signed)
Pt stated that she requested a Friday for her procedure to be scheduled on. Pt was notified that Dr. Chales Abrahams does not have any availability on Friday's through April at the Hospital   Pt stated that she would keep the appointment that she already had scheduled. Pt verbalized understanding with all questions answered.

## 2023-07-28 NOTE — Telephone Encounter (Signed)
Error

## 2023-07-28 NOTE — Telephone Encounter (Signed)
-----   Message from Doree Albee sent at 07/28/2023  7:58 AM EST ----- Regarding: FW: Anesthesia Discussed with Jonny Ruiz  due to large goiter he would prefer for her to have EGD and colon in the hospital.  With her symptoms and labs I would suggest going with first available in the hospital for abdominal pain, diarrhea, weight loss, nausea vomiting. Was with Dr. Chales Abrahams.  Marchelle Folks ----- Message ----- From: Cathlyn Parsons, CRNA Sent: 07/27/2023   5:15 PM EST To: Doree Albee, PA-C Subject: RE: Anesthesia                                 Marchelle Folks,  D/t her goiter it would be best to schedule her procedure at the hospital.  Thanks,  JMN ----- Message ----- From: Javier Glazier Sent: 07/27/2023   8:04 AM EST To: Cathlyn Parsons, CRNA Subject: Anesthesia                                     Morning John,  Is actually listening to CME that was called every once in ASA 3 and a prompt me to think of this patient that I saw a week or 2 ago.  She is scheduled with Dr. Chales Abrahams at the [facility] in March. She previously was morbidly obese but has lost about 100 pounds over the last year without trying I think part of this may actually be hyperthyroidism she had a really large thyroid mass/goiter on exam that had not been evaluated and her thyroid was in the hyperthyroidism range.  Very concerning for Graves' disease I sent to primary care and getting thyroid ultrasound but the thyroid goiter/mass she has is rather large. She has no compressive symptoms but it may be something you actually want to look at prior to scheduling at the [facility], uncertain if that would preclude her or not. She was also previously on BiPAP and oxygen due to obesity hypoventilation syndrome however with her substantial weight loss her BMI is normal and she is no longer on these.  Thanks

## 2023-08-02 DIAGNOSIS — G4733 Obstructive sleep apnea (adult) (pediatric): Secondary | ICD-10-CM | POA: Diagnosis not present

## 2023-08-02 DIAGNOSIS — J9611 Chronic respiratory failure with hypoxia: Secondary | ICD-10-CM | POA: Diagnosis not present

## 2023-08-03 NOTE — Addendum Note (Signed)
Addended by: Quentin Mulling on: 08/03/2023 02:42 PM   Modules accepted: Orders

## 2023-08-04 ENCOUNTER — Other Ambulatory Visit: Payer: Self-pay

## 2023-08-04 DIAGNOSIS — E049 Nontoxic goiter, unspecified: Secondary | ICD-10-CM

## 2023-08-04 DIAGNOSIS — E0590 Thyrotoxicosis, unspecified without thyrotoxic crisis or storm: Secondary | ICD-10-CM

## 2023-08-31 DIAGNOSIS — I10 Essential (primary) hypertension: Secondary | ICD-10-CM | POA: Diagnosis not present

## 2023-08-31 DIAGNOSIS — G4730 Sleep apnea, unspecified: Secondary | ICD-10-CM | POA: Diagnosis not present

## 2023-08-31 DIAGNOSIS — J069 Acute upper respiratory infection, unspecified: Secondary | ICD-10-CM | POA: Diagnosis not present

## 2023-09-01 DIAGNOSIS — J9611 Chronic respiratory failure with hypoxia: Secondary | ICD-10-CM | POA: Diagnosis not present

## 2023-09-01 DIAGNOSIS — G4733 Obstructive sleep apnea (adult) (pediatric): Secondary | ICD-10-CM | POA: Diagnosis not present

## 2023-09-04 ENCOUNTER — Telehealth: Payer: Self-pay

## 2023-09-04 ENCOUNTER — Encounter (HOSPITAL_COMMUNITY): Payer: Self-pay | Admitting: Gastroenterology

## 2023-09-04 NOTE — Telephone Encounter (Signed)
 Marland Kitchen  ho

## 2023-09-04 NOTE — Telephone Encounter (Signed)
 Procedure:colon/endo Procedure date: 09/11/23 Procedure location: wl Arrival Time: 615am Spoke with the patient Y/N: y Any prep concerns? y  Has the patient obtained the prep from the pharmacy ? y Do you have a care partner and transportation: y Any additional concerns? Y  Pt spoke to linda about reprinting her instruction and sending it to her MyChart. PT states she never received new instructions.

## 2023-09-05 NOTE — Telephone Encounter (Signed)
 RN, Viviann Spare, sent new instructions for procedure on 3-10 today

## 2023-09-08 ENCOUNTER — Encounter: Payer: Medicaid Other | Admitting: Gastroenterology

## 2023-09-08 NOTE — Anesthesia Preprocedure Evaluation (Addendum)
 Anesthesia Evaluation  Patient identified by MRN, date of birth, ID band Patient awake    Reviewed: Allergy & Precautions, NPO status , Patient's Chart, lab work & pertinent test results  Airway Mallampati: I  TM Distance: >3 FB Neck ROM: Full    Dental  (+) Dental Advisory Given, Chipped,    Pulmonary former smoker   Pulmonary exam normal breath sounds clear to auscultation       Cardiovascular negative cardio ROS Normal cardiovascular exam Rhythm:Regular Rate:Normal     Neuro/Psych  Headaches  negative psych ROS   GI/Hepatic Neg liver ROS,GERD  ,,Abdominal pain, constipation   Endo/Other    Class 3 obesity  Renal/GU negative Renal ROS  negative genitourinary   Musculoskeletal negative musculoskeletal ROS (+)    Abdominal   Peds  Hematology negative hematology ROS (+)   Anesthesia Other Findings   Reproductive/Obstetrics negative OB ROS                             Anesthesia Physical Anesthesia Plan  ASA: 3  Anesthesia Plan: MAC   Post-op Pain Management:    Induction:   PONV Risk Score and Plan: 2 and Propofol infusion and TIVA  Airway Management Planned: Natural Airway and Simple Face Mask  Additional Equipment: None  Intra-op Plan:   Post-operative Plan:   Informed Consent: I have reviewed the patients History and Physical, chart, labs and discussed the procedure including the risks, benefits and alternatives for the proposed anesthesia with the patient or authorized representative who has indicated his/her understanding and acceptance.       Plan Discussed with: CRNA  Anesthesia Plan Comments:        Anesthesia Quick Evaluation

## 2023-09-10 ENCOUNTER — Telehealth: Payer: Self-pay | Admitting: Gastroenterology

## 2023-09-10 NOTE — Telephone Encounter (Signed)
 Patient called today having questions about colonoscopy prep.  She states she is extremely hungry and would like to eat some solid food.  She was wondering if this could be part of her prep.  Advised that the day before colonoscopy patient needs to adhere to clear liquids.  I provided her with a list below.  Recommended things to try such as Gatorade, popsicles, Jell-O, broth and provided adequate encouragement for patient.  Also discussed if she has any other questions she is welcome to call us back at any time.   Drink clear liquids the entire day - YOU SHOULD NOT EAT ANY SOLID FOOD.   Do not drink anything colored red or purple. Avoid juices with pulp. No orange juice. No Milk products. Avoid Alcohol.   Drink an 8-10 oz glass of clear liquids every hour during the day to prevent dehydration and help the prep work best.  Clear liquids include:  NO RED & NO PURPLE Water Jello- orange, blue, green, yellow  Ice Popsicles-Orange or Banana  Tea (sugar ok, no milk/cream) Powdered fruit flavored drinks  Coffee (sugar ok, no milk/cream) Gatorade/Pedialyte   Juice: apple, white grape, white cranberry Lemonade, Kool-Aid  Clear bullion,broth (vegetable,chicken,beef) Carbonated beverages (any kind)  Strained chicken noodle soup broth (no noodles or chicken) Hard Candy

## 2023-09-11 ENCOUNTER — Encounter (HOSPITAL_COMMUNITY): Payer: Self-pay | Admitting: Gastroenterology

## 2023-09-11 ENCOUNTER — Ambulatory Visit (HOSPITAL_BASED_OUTPATIENT_CLINIC_OR_DEPARTMENT_OTHER): Payer: Self-pay | Admitting: Registered Nurse

## 2023-09-11 ENCOUNTER — Encounter (HOSPITAL_COMMUNITY)
Admission: RE | Disposition: A | Discharge status: Home / Self Care | Payer: Self-pay | Source: Home / Self Care | Attending: Gastroenterology

## 2023-09-11 ENCOUNTER — Ambulatory Visit (HOSPITAL_COMMUNITY): Payer: Self-pay | Admitting: Registered Nurse

## 2023-09-11 ENCOUNTER — Ambulatory Visit (HOSPITAL_COMMUNITY)
Admission: RE | Admit: 2023-09-11 | Discharge: 2023-09-11 | Disposition: A | Discharge status: Home / Self Care | Payer: Medicaid Other | Attending: Gastroenterology | Admitting: Gastroenterology

## 2023-09-11 ENCOUNTER — Other Ambulatory Visit: Payer: Self-pay

## 2023-09-11 DIAGNOSIS — K5900 Constipation, unspecified: Secondary | ICD-10-CM | POA: Diagnosis not present

## 2023-09-11 DIAGNOSIS — Z9049 Acquired absence of other specified parts of digestive tract: Secondary | ICD-10-CM | POA: Insufficient documentation

## 2023-09-11 DIAGNOSIS — K5669 Other partial intestinal obstruction: Secondary | ICD-10-CM

## 2023-09-11 DIAGNOSIS — E662 Morbid (severe) obesity with alveolar hypoventilation: Secondary | ICD-10-CM | POA: Insufficient documentation

## 2023-09-11 DIAGNOSIS — K2282 Esophagogastric junction polyp: Secondary | ICD-10-CM | POA: Diagnosis not present

## 2023-09-11 DIAGNOSIS — K6389 Other specified diseases of intestine: Secondary | ICD-10-CM

## 2023-09-11 DIAGNOSIS — D509 Iron deficiency anemia, unspecified: Secondary | ICD-10-CM | POA: Insufficient documentation

## 2023-09-11 DIAGNOSIS — J9611 Chronic respiratory failure with hypoxia: Secondary | ICD-10-CM | POA: Diagnosis not present

## 2023-09-11 DIAGNOSIS — E785 Hyperlipidemia, unspecified: Secondary | ICD-10-CM | POA: Insufficient documentation

## 2023-09-11 DIAGNOSIS — R109 Unspecified abdominal pain: Secondary | ICD-10-CM

## 2023-09-11 DIAGNOSIS — K529 Noninfective gastroenteritis and colitis, unspecified: Secondary | ICD-10-CM

## 2023-09-11 DIAGNOSIS — K2970 Gastritis, unspecified, without bleeding: Secondary | ICD-10-CM | POA: Insufficient documentation

## 2023-09-11 DIAGNOSIS — K317 Polyp of stomach and duodenum: Secondary | ICD-10-CM | POA: Diagnosis not present

## 2023-09-11 DIAGNOSIS — Z6841 Body Mass Index (BMI) 40.0 and over, adult: Secondary | ICD-10-CM | POA: Insufficient documentation

## 2023-09-11 DIAGNOSIS — C186 Malignant neoplasm of descending colon: Secondary | ICD-10-CM | POA: Diagnosis not present

## 2023-09-11 DIAGNOSIS — R12 Heartburn: Secondary | ICD-10-CM | POA: Diagnosis not present

## 2023-09-11 DIAGNOSIS — K449 Diaphragmatic hernia without obstruction or gangrene: Secondary | ICD-10-CM

## 2023-09-11 DIAGNOSIS — K640 First degree hemorrhoids: Secondary | ICD-10-CM | POA: Diagnosis not present

## 2023-09-11 DIAGNOSIS — R634 Abnormal weight loss: Secondary | ICD-10-CM | POA: Diagnosis not present

## 2023-09-11 DIAGNOSIS — Z01818 Encounter for other preprocedural examination: Secondary | ICD-10-CM

## 2023-09-11 DIAGNOSIS — C184 Malignant neoplasm of transverse colon: Secondary | ICD-10-CM

## 2023-09-11 DIAGNOSIS — K56609 Unspecified intestinal obstruction, unspecified as to partial versus complete obstruction: Secondary | ICD-10-CM | POA: Diagnosis not present

## 2023-09-11 HISTORY — PX: COLONOSCOPY WITH PROPOFOL: SHX5780

## 2023-09-11 HISTORY — PX: SUBMUCOSAL INJECTION: SHX5543

## 2023-09-11 HISTORY — PX: ESOPHAGOGASTRODUODENOSCOPY (EGD) WITH PROPOFOL: SHX5813

## 2023-09-11 LAB — COMPREHENSIVE METABOLIC PANEL
ALT: 14 U/L (ref 0–44)
AST: 14 U/L — ABNORMAL LOW (ref 15–41)
Albumin: 3.7 g/dL (ref 3.5–5.0)
Alkaline Phosphatase: 58 U/L (ref 38–126)
Anion gap: 7 (ref 5–15)
BUN: 5 mg/dL — ABNORMAL LOW (ref 6–20)
CO2: 26 mmol/L (ref 22–32)
Calcium: 8.8 mg/dL — ABNORMAL LOW (ref 8.9–10.3)
Chloride: 105 mmol/L (ref 98–111)
Creatinine, Ser: 0.63 mg/dL (ref 0.44–1.00)
GFR, Estimated: >60 mL/min (ref >60)
Glucose, Bld: 100 mg/dL — ABNORMAL HIGH (ref 70–99)
Potassium: 3.1 mmol/L — ABNORMAL LOW (ref 3.5–5.1)
Sodium: 138 mmol/L (ref 135–145)
Total Bilirubin: 0.9 mg/dL (ref 0.0–1.2)
Total Protein: 7.7 g/dL (ref 6.5–8.1)

## 2023-09-11 LAB — CBC
HCT: 36.6 % (ref 36.0–46.0)
Hemoglobin: 11.5 g/dL — ABNORMAL LOW (ref 12.0–15.0)
MCH: 24.9 pg — ABNORMAL LOW (ref 26.0–34.0)
MCHC: 31.4 g/dL (ref 30.0–36.0)
MCV: 79.2 fL — ABNORMAL LOW (ref 80.0–100.0)
Platelets: 249 10*3/uL (ref 150–400)
RBC: 4.62 MIL/uL (ref 3.87–5.11)
RDW: 13.5 % (ref 11.5–15.5)
WBC: 5.3 10*3/uL (ref 4.0–10.5)
nRBC: 0 % (ref 0.0–0.2)

## 2023-09-11 LAB — PREGNANCY, URINE: Preg Test, Ur: NEGATIVE

## 2023-09-11 SURGERY — COLONOSCOPY WITH PROPOFOL
Anesthesia: Monitor Anesthesia Care

## 2023-09-11 MED ORDER — ACETAMINOPHEN 10 MG/ML IV SOLN
1000.0000 mg | Freq: Once | INTRAVENOUS | Status: AC
  Administered 2023-09-11: 1000 mg via INTRAVENOUS
  Filled 2023-09-11: qty 100

## 2023-09-11 MED ORDER — PROPOFOL 500 MG/50ML IV EMUL
INTRAVENOUS | Status: AC
  Filled 2023-09-11: qty 50

## 2023-09-11 MED ORDER — OMEPRAZOLE MAGNESIUM 20 MG PO TBEC: 20.0000 mg | DELAYED_RELEASE_TABLET | Freq: Every day | ORAL | 1 refills | Status: DC

## 2023-09-11 MED ORDER — SPOT INK MARKER SYRINGE KIT
PACK | SUBMUCOSAL | Status: DC | PRN
  Administered 2023-09-11: 2 mL via SUBMUCOSAL

## 2023-09-11 MED ORDER — LIDOCAINE 2% (20 MG/ML) 5 ML SYRINGE
INTRAMUSCULAR | Status: DC | PRN
  Administered 2023-09-11: 50 mg via INTRAVENOUS

## 2023-09-11 MED ORDER — DEXMEDETOMIDINE HCL IN NACL 80 MCG/20ML IV SOLN
INTRAVENOUS | Status: DC | PRN
  Administered 2023-09-11: 8 ug via INTRAVENOUS

## 2023-09-11 MED ORDER — PROPOFOL 500 MG/50ML IV EMUL
INTRAVENOUS | Status: DC | PRN
  Administered 2023-09-11: 135 ug/kg/min via INTRAVENOUS
  Administered 2023-09-11: 40 mg via INTRAVENOUS
  Administered 2023-09-11: 30 mg via INTRAVENOUS
  Administered 2023-09-11: 20 mg via INTRAVENOUS

## 2023-09-11 MED ORDER — SPOT INK MARKER SYRINGE KIT
PACK | SUBMUCOSAL | Status: AC
  Filled 2023-09-11: qty 5

## 2023-09-11 MED ORDER — SODIUM CHLORIDE 0.9 % IV SOLN
INTRAVENOUS | Status: DC
Start: 2023-09-11 — End: 2023-09-11

## 2023-09-11 SURGICAL SUPPLY — 23 items
BLOCK BITE 60FR ADLT L/F BLUE (MISCELLANEOUS) ×2 IMPLANT
ELECT REM PT RETURN 9FT ADLT (ELECTROSURGICAL) IMPLANT
ELECTRODE REM PT RTRN 9FT ADLT (ELECTROSURGICAL) IMPLANT
FLOOR PAD 36X40 (MISCELLANEOUS) ×2 IMPLANT
FORCEP RJ3 GP 1.8X160 W-NEEDLE (CUTTING FORCEPS) IMPLANT
FORCEPS BIOP RAD 4 LRG CAP 4 (CUTTING FORCEPS) IMPLANT
FORCEPS BIOP RJ4 240 W/NDL (CUTTING FORCEPS) IMPLANT
FORCEPS BXJMBJMB 240X2.8X (CUTTING FORCEPS) IMPLANT
INJECTOR/SNARE I SNARE (MISCELLANEOUS) IMPLANT
LUBRICANT JELLY 4.5OZ STERILE (MISCELLANEOUS) IMPLANT
MANIFOLD NEPTUNE II (INSTRUMENTS) IMPLANT
NDL SCLEROTHERAPY 25GX240 (NEEDLE) IMPLANT
NEEDLE SCLEROTHERAPY 25GX240 (NEEDLE) IMPLANT
PAD FLOOR 36X40 (MISCELLANEOUS) ×2 IMPLANT
PROBE APC STR FIRE (PROBE) IMPLANT
PROBE INJECTION GOLD 7FR (MISCELLANEOUS) IMPLANT
SNARE ROTATE MED OVAL 20MM (MISCELLANEOUS) IMPLANT
SNARE SHORT THROW 13M SML OVAL (MISCELLANEOUS) IMPLANT
SYR 50ML LL SCALE MARK (SYRINGE) IMPLANT
TRAP SPECIMEN MUCOUS 40CC (MISCELLANEOUS) IMPLANT
TUBING ENDO SMARTCAP PENTAX (MISCELLANEOUS) ×4 IMPLANT
TUBING IRRIGATION ENDOGATOR (MISCELLANEOUS) ×4 IMPLANT
WATER STERILE IRR 1000ML POUR (IV SOLUTION) IMPLANT

## 2023-09-11 NOTE — Transfer of Care (Signed)
 Immediate Anesthesia Transfer of Care Note  Patient: Ashley [name]  Procedure(s) Performed: COLONOSCOPY WITH PROPOFOL INJECTION, SUBMUCOSAL ESOPHAGOGASTRODUODENOSCOPY (EGD) WITH PROPOFOL  Patient Location: PACU  Anesthesia Type:MAC  Level of Consciousness: awake, alert , oriented, and patient cooperative  Airway & Oxygen Therapy: Patient Spontanous Breathing and Patient connected to face mask oxygen  Post-op Assessment: Report given to RN, Post -op Vital signs reviewed and stable, and Patient moving all extremities  Post vital signs: Reviewed and stable  Last Vitals:  Vitals Value Taken Time  BP    Temp    Pulse    Resp    SpO2      Last Pain:  Vitals:   09/11/23 0639  TempSrc: Tympanic  PainSc: 0-No pain         Complications: No notable events documented.

## 2023-09-11 NOTE — Op Note (Signed)
 [facility] Patient Name: Ashley [name] Procedure Date: 09/11/2023 MRN: [id number] Attending MD: Lynann Bologna , MD, [id number] Date of Birth: 11/06/1985 CSN: [id number] Age: 38 Admit Type: Outpatient Procedure:                Upper GI endoscopy Indications:              Iron deficiency anemia, Heartburn Providers:                Lynann Bologna, MD, Lorenza Evangelist, RN, Sunday Corn                            Mbumina, Technician Referring MD:              Medicines:                Monitored Anesthesia Care Complications:            No immediate complications. Estimated Blood Loss:     Estimated blood loss: none. Procedure:                Pre-Anesthesia Assessment:                           - Prior to the procedure, a History and Physical                            was performed, and patient medications and                            allergies were reviewed. The patient's tolerance of                            previous anesthesia was also reviewed. The risks                            and benefits of the procedure and the sedation                            options and risks were discussed with the patient.                            All questions were answered, and informed consent                            was obtained. Prior Anticoagulants: The patient has                            taken no anticoagulant or antiplatelet agents. ASA                            Grade Assessment: III - A patient with severe                            systemic disease. After reviewing the risks and  benefits, the patient was deemed in satisfactory                            condition to undergo the procedure.                           After obtaining informed consent, the endoscope was                            passed under direct vision. Throughout the                            procedure, the patient's blood pressure, pulse, and                             oxygen saturations were monitored continuously. The                            [id number]-H190 ([id number]) Olympus endoscope was introduced                            through the mouth, and advanced to the second part                            of duodenum. The upper GI endoscopy was                            accomplished without difficulty. The patient                            tolerated the procedure well. Scope In: Scope Out: Findings:      The examined esophagus was normal.      A single 12 mm sessile polyp with no bleeding and no stigmata of recent       bleeding was found at the gastroesophageal junction. Biopsies were taken       with a cold forceps for histology.      A small hiatal hernia was present.      Localized mild inflammation characterized by erythema was found in the       gastric antrum. Biopsies were taken with a cold forceps for histology.      The examined duodenum was normal. Biopsies for histology were taken with       a cold forceps for evaluation of celiac disease. Impression:               - A single gastroesophageal junction polyp.                            Biopsied.                           - Small hiatal hernia.                           - Gastritis. Biopsied.                           -  Normal examined duodenum. Biopsied. Moderate Sedation:      Not Applicable - Patient had care per Anesthesia. Recommendation:           - Patient has a contact number available for                            emergencies. The signs and symptoms of potential                            delayed complications were discussed with the                            patient. Return to normal activities tomorrow.                            Written discharge instructions were provided to the                            patient.                           - Resume previous diet.                           - Continue present medications.                           - Start omeprazole 20 mg p.o.  daily                           - Await pathology results.                           - Depending upon the above biopsies, may require                            EUS/polypectomy by Dr. Meridee Score at a later date                            (if needed).                           - Proceed with colonoscopy.                           - The findings and recommendations were discussed                            with the patient's family. Procedure Code(s):        --- Professional ---                           [phone], Esophagogastroduodenoscopy, flexible,                            transoral; with biopsy, single or multiple Diagnosis Code(s):        ---  Professional ---                           K22.82, Esophagogastric junction polyp                           K44.9, Diaphragmatic hernia without obstruction or                            gangrene                           K29.70, Gastritis, unspecified, without bleeding                           D50.9, Iron deficiency anemia, unspecified                           R12, Heartburn CPT copyright 2022 [employer]. All rights reserved. The codes documented in this report are preliminary and upon coder review may  be revised to meet current compliance requirements. Lynann Bologna, MD 09/11/2023 8:04:51 AM This report has been signed electronically. Number of Addenda: 0

## 2023-09-11 NOTE — Anesthesia Procedure Notes (Signed)
 Procedure Name: MAC Date/Time: 09/11/2023 7:23 AM  Performed by: Elisabeth Cara, CRNAPre-anesthesia Checklist: Patient identified, Emergency Drugs available, Suction available, Patient being monitored and Timeout performed Patient Re-evaluated:Patient Re-evaluated prior to induction Oxygen Delivery Method: Simple face mask Placement Confirmation: positive ETCO2 Dental Injury: Teeth and Oropharynx as per pre-operative assessment

## 2023-09-11 NOTE — Progress Notes (Signed)
 Pt presented to endoscopy today for EGD/Colonoscopy with MD Chales Abrahams. Post-operatively, pt c/o new onset 8/10 headache. MDA Woodrum notified. VO for 1000 mg acetaminophen IV. Medication given as ordered. After receiving acetaminophen, pt endorses improvement in headache. Pt discharged to home.  Eulas Post, RN 09/11/23 9:40 AM

## 2023-09-11 NOTE — Discharge Instructions (Signed)
YOU HAD AN ENDOSCOPIC PROCEDURE TODAY: Refer to the procedure report and other information in the discharge instructions given to you for any specific questions about what was found during the examination. If this information does not answer your questions, please call [place] office at [phone] to clarify.   YOU SHOULD EXPECT: Some feelings of bloating in the abdomen. Passage of more gas than usual. Walking can help get rid of the air that was put into your GI tract during the procedure and reduce the bloating. If you had a lower endoscopy (such as a colonoscopy or flexible sigmoidoscopy) you may notice spotting of blood in your stool or on the toilet paper. Some abdominal soreness may be present for a day or two, also.  DIET: Your first meal following the procedure should be a light meal and then it is ok to progress to your normal diet. A half-sandwich or bowl of soup is an example of a good first meal. Heavy or fried foods are harder to digest and may make you feel nauseous or bloated. Drink plenty of fluids but you should avoid alcoholic beverages for 24 hours.   ACTIVITY: Your care partner should take you home directly after the procedure. You should plan to take it easy, moving slowly for the rest of the day. You can resume normal activity the day after the procedure however YOU SHOULD NOT DRIVE, use power tools, machinery or perform tasks that involve climbing or major physical exertion for 24 hours (because of the sedation medicines used during the test).   SYMPTOMS TO REPORT IMMEDIATELY: A gastroenterologist can be reached at any hour. Please call [phone]  for any of the following symptoms:  Following lower endoscopy (colonoscopy, flexible sigmoidoscopy) Excessive amounts of blood in the stool  Significant tenderness, worsening of abdominal pains  Swelling of the abdomen that is new, acute  Fever of 100 or higher  Following upper endoscopy (EGD, EUS, ERCP, esophageal  dilation) Vomiting of blood or coffee ground material  New, significant abdominal pain  New, significant chest pain or pain under the shoulder blades  Painful or persistently difficult swallowing  New shortness of breath  Black, tarry-looking or red, bloody stools  FOLLOW UP:  If any biopsies were taken you will be contacted by phone or by letter within the next 1-3 weeks. Call [phone]  if you have not heard about the biopsies in 3 weeks.  Please also call with any specific questions about appointments or follow up tests.

## 2023-09-11 NOTE — H&P (Signed)
 07/18/2023 Ashley [name] [id number] March 03, 1986   Referring provider: Leilani Able, MD Primary GI doctor: Dr. Chales Abrahams   ASSESSMENT AND PLAN:     Colitis mid descending thickening Chronic constipation with soft stools, left upper abdominal pain, and occasional blood in stool. Previous diagnosis of colitis in the ER. Symptoms have improved recently but still present. Very concerning for IBD with associated weight loss, nausea vomiting and what sounds like tenesmus. -Schedule colonoscopy and endoscopy to evaluate at [facility] with Dr. Chales Abrahams patient should be appropriate now with her weight loss. -Order labs to check for inflammatory markers. -Prescribe as-needed Levsin for abdominal spasms. -FODMAP given -If colonoscopy negative likely colitis was infectious with resolution of symptoms, may need to consider pelvic floor dysfunction is contributing to symptoms   Enlarged Thyroid Noted on physical exam, no symptoms of dysphagia or compressive symptoms, some irregularity to it could be benign goiter versus rule out malignancy -Order thyroid function tests. -Will order ultrasound to not delay care but will need to follow-up with primary care will send note to PCP.     Weight Loss Significant weight loss of approximately 60 pounds over the past year due to decreased appetite and dietary changes related to abdominal symptoms. -Monitor weight and nutritional status.   Sleep Apnea Improvement in symptoms with weight loss, no longer using CPAP machine. -Continue monitoring.   Acid Reflux History of acid reflux, no current treatment. -With nausea and weight loss we will also plan for endoscopy with colonoscopy to evaluate further.   Follow-up -Plan for follow-up with primary care provider for thyroid ultrasound results and ongoing management. -Plan for follow-up after colonoscopy and endoscopy to discuss results and treatment plan.  -Risk of bowel prep, conscious sedation, and EGD and  colonoscopy were discussed.  Risks include but are not limited to dehydration, pain, bleeding, cardiopulmonary process, bowel perforation, or other possible adverse outcomes..  Treatment plan was discussed with patient, and agreed upon.   Patient Care Team: Leilani Able, MD as PCP - General (Family Medicine)   HISTORY OF PRESENT ILLNESS: 38 y.o. female with a past medical history of obesity, OSA on BiPAP chronic respiratory failure with hypoxia from obesity hypoventilation syndrome on 3 L oxygen at night with BiPAP, hypertension, hyperlipidemia, status post cholecystectomy, tobacco use and others listed below presents for evaluation of colitis.    04/2023 ER visit for abdominal pain with associated nausea and vomiting and constipation. CT showed focal wall thickening mid descending colon consistent with colitis.  CBC without anemia or leukocytosis albumin 3.0, potassium 3.3 otherwise kidney and liver unremarkable negative hCG lipase 102 patient was given ondansetron and MiraLAX   Discussed the use of AI scribe software for clinical note transcription with the patient, who gave verbal consent to proceed.   History of Present Illness   The patient, previously diagnosed with colitis, presents with ongoing gastrointestinal symptoms that have persisted for over three months. She reports experiencing constipation, with stools described as soft and infrequent, occurring every few days. The patient also describes a sensation of needing to defecate but being unable to do so, which was particularly notable the day prior to the consultation.   Accompanying these symptoms is abdominal pain, localized to the left upper quadrant, which the patient reports as being alleviated post-defecation. Notably, the patient has observed small amounts of orange-red blood in her stool.   In addition to these symptoms, the patient has experienced nausea and vomiting, which she attributes to the consumption of colored  drinks.  As a result, she has significantly reduced her food and drink intake, leading to a substantial weight loss of approximately 60 pounds since November of the previous year.   The patient has a family history of colon cancer, with her grandmother having been diagnosed with the disease. The patient notes improvements in her sleep and breathing since her weight loss, no longer requiring a sleep apnea machine or supplemental oxygen.  Patient denies chest pain, shortness of breath. She has not had her thyroid evaluated, no family history of thyroid cancer, autoimmune, palpitations.      She  reports that she quit smoking about 13 years ago. Her smoking use included cigarettes. She has never used smokeless tobacco. She reports that she does not drink alcohol and does not use drugs.   RELEVANT LABS AND IMAGING:   04/04/2023 CT AB and pelvis with contrast IMPRESSION: Focal wall thickening in the mid descending colon consistent with colitis. Aeration is noted.    CBC Labs (Brief)          Component Value Date/Time    WBC 7.6 04/04/2023 1653    RBC 4.52 04/04/2023 1653    HGB 12.4 04/04/2023 1653    HGB 10.9 (L) 06/02/2016 1105    HCT 37.7 04/04/2023 1653    HCT 33.9 (L) 06/02/2016 1105    PLT 258 04/04/2023 1653    PLT 189 06/02/2016 1105    MCV 83.4 04/04/2023 1653    MCV 80 06/02/2016 1105    MCH 27.4 04/04/2023 1653    MCHC 32.9 04/04/2023 1653    RDW 12.8 04/04/2023 1653    RDW 15.4 06/02/2016 1105    LYMPHSABS 2.1 01/27/2016 1545    MONOABS 0.6 06/23/2015 1030    EOSABS 0.2 01/27/2016 1545    BASOSABS 0.0 01/27/2016 1545      Recent Labs (within last 365 days)     Recent Labs    04/04/23 1653  HGB 12.4        CMP     Labs (Brief)          Component Value Date/Time    NA 136 04/04/2023 1653    K 3.3 (L) 04/04/2023 1653    CL 98 04/04/2023 1653    CO2 27 04/04/2023 1653    GLUCOSE 93 04/04/2023 1653    BUN 5 (L) 04/04/2023 1653    CREATININE 0.65 04/04/2023 1653     CALCIUM 8.7 (L) 04/04/2023 1653    PROT 6.7 04/04/2023 1653    ALBUMIN 3.0 (L) 04/04/2023 1653    AST 12 (L) 04/04/2023 1653    ALT 12 04/04/2023 1653    ALKPHOS 60 04/04/2023 1653    BILITOT 0.7 04/04/2023 1653    GFRNONAA >60 04/04/2023 1653    GFRAA >90 08/28/2014 1950          Latest Ref Rng & Units 04/04/2023    4:53 PM 08/28/2014    7:50 PM 07/08/2014    2:29 PM  Hepatic Function  Total Protein 6.5 - 8.1 g/dL 6.7  7.0  7.1   Albumin 3.5 - 5.0 g/dL 3.0  3.3  3.4   AST 15 - 41 U/L 12  20  13    ALT 0 - 44 U/L 12  18  17    Alk Phosphatase 38 - 126 U/L 60  46  57   Total Bilirubin 0.3 - 1.2 mg/dL 0.7  0.8  0.4       Current Medications:  Current Outpatient Medications (Cardiovascular):    amLODipine (NORVASC) 10 MG tablet, Take 10 mg by mouth daily. (Patient not taking: Reported on 07/18/2023)   hydrochlorothiazide (HYDRODIURIL) 25 MG tablet, Take 25 mg by mouth daily. (Patient not taking: Reported on 07/18/2023)     Current Outpatient Medications (Analgesics):    ibuprofen (ADVIL) 800 MG tablet, Take 800 mg by mouth 3 (three) times daily.   meloxicam (MOBIC) 15 MG tablet, Take 15 mg by mouth daily.     Current Outpatient Medications (Other):    hyoscyamine (LEVSIN SL) 0.125 MG SL tablet, Place 1 tablet (0.125 mg total) under the tongue every 6 (six) hours as needed for cramping (nausea, diarrhea).   Na Sulfate-K Sulfate-Mg Sulf 17.5-3.13-1.6 GM/177ML SOLN, Take 1 kit by mouth once for 1 dose.   ondansetron (ZOFRAN-ODT) 4 MG disintegrating tablet, Take 1 tablet (4 mg total) by mouth every 8 (eight) hours as needed for nausea or vomiting. 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   Irregular periods/menstrual cycles 01/30/2012   Late prenatal care 01/30/2012   Poor  social situation 01/30/2012   Previous cesarean section complicating pregnancy, antepartum condition or complication 01/27/2016    2 previous cesarean section- Desires repeat with BTL   Single liveborn, born in hospital, delivered by cesarean delivery 08/13/2016   Status post repeat low transverse cesarean section 08/14/2016   Status post tubal ligation at time of delivery, current hosp 08/14/2016        Allergies:  Allergies       Allergies  Allergen Reactions   Fruit Extracts Itching      Fresh Fruit         Surgical History:  She  has a past surgical history that includes Cesarean section (2009); Tonsillectomy (AGE 63); Tonsillectomy and adenoidectomy (AGEB 18); Cesarean section (01/30/2012); Cholecystectomy (N/A, 10/16/2013); Dilation and evacuation (N/A, 07/28/2015); and Cesarean section with bilateral tubal ligation (Bilateral, 08/13/2016). Family History:  Her family history includes Asthma in her mother; Cancer in her maternal grandmother; Early death in her mother; Hypertension in her mother.   REVIEW OF SYSTEMS  : All other systems reviewed and negative except where noted in the History of Present Illness.   PHYSICAL EXAM: BP 130/68   Pulse 80   Ht 5\' 3"  (1.6 m)   Wt 264 lb (119.7 kg)   BMI 46.77 kg/m  General Appearance: Obese, in no apparent distress. Head:   Normocephalic and atraumatic.  Thyroid gland diffusely enlarged, possible right greater than left no distinct nodules, nontender. Eyes:  sclerae anicteric,conjunctive pink  Respiratory: Respiratory effort normal, BS equal bilaterally without rales, rhonchi, wheezing. Cardio: RRR with no MRGs. Peripheral pulses intact.  Abdomen: Soft,  Obese ,active bowel sounds. mild tenderness in the LUQ. Without guarding and Without rebound. No masses. Rectal: declines Musculoskeletal: Full ROM, Normal gait. Without edema. Skin:  Dry and intact without significant lesions or rashes Neuro: Alert and  oriented x4;  No focal  deficits. Psych:  Cooperative. Normal mood and affect.      Doree Albee, PA-C    Attending physician's note   I have taken history, reviewed the chart and examined the patient. I performed a substantive portion of this encounter, including complete  performance of at least one of the key components, in conjunction with the APP. I agree with the Advanced Practitioner's note, impression and recommendations.   For EGD/colon today  Edman Circle, MD [facility] [phone]

## 2023-09-11 NOTE — Anesthesia Postprocedure Evaluation (Signed)
 Anesthesia Post Note  Patient: Ashley [name]  Procedure(s) Performed: COLONOSCOPY WITH PROPOFOL INJECTION, SUBMUCOSAL ESOPHAGOGASTRODUODENOSCOPY (EGD) WITH PROPOFOL     Patient location during evaluation: Endoscopy Anesthesia Type: MAC Level of consciousness: awake and alert Pain management: pain level controlled Vital Signs Assessment: post-procedure vital signs reviewed and stable Respiratory status: spontaneous breathing, nonlabored ventilation, respiratory function stable and patient connected to nasal cannula oxygen Cardiovascular status: blood pressure returned to baseline and stable Postop Assessment: no apparent nausea or vomiting Anesthetic complications: no  No notable events documented.  Last Vitals:  Vitals:   09/11/23 0820 09/11/23 0830  BP: 124/80 130/74  Pulse: 97 92  Resp: 19 20  Temp:    SpO2: 100% 99%    Last Pain:  Vitals:   09/11/23 0852  TempSrc:   PainSc: 0-No pain                 Lakeyia Surber L Linzy Darling

## 2023-09-11 NOTE — Op Note (Signed)
 [facility] Patient Name: Ashley [name] Procedure Date: 09/11/2023 MRN: [id number] Attending MD: Lynann Bologna , MD, [id number] Date of Birth: 01-10-1986 CSN: [id number] Age: 38 Admit Type: Outpatient Procedure:                Colonoscopy Indications:              Iron deficiency anemia with abn CT, wt loss. Providers:                Lynann Bologna, MD, Lorenza Evangelist, RN, Melany Guernsey, Technician Referring MD:             Dr Pecola Leisure, Quentin Mulling PA Medicines:                Monitored Anesthesia Care Complications:            No immediate complications. Estimated Blood Loss:     Estimated blood loss: none. Procedure:                Pre-Anesthesia Assessment:                           - Prior to the procedure, a History and Physical                            was performed, and patient medications and                            allergies were reviewed. The patient's tolerance of                            previous anesthesia was also reviewed. The risks                            and benefits of the procedure and the sedation                            options and risks were discussed with the patient.                            All questions were answered, and informed consent                            was obtained. Prior Anticoagulants: The patient has                            taken no anticoagulant or antiplatelet agents. ASA                            Grade Assessment: III - A patient with severe                            systemic disease. After reviewing the risks and  benefits, the patient was deemed in satisfactory                            condition to undergo the procedure.                           After obtaining informed consent, the colonoscope                            was passed under direct vision. Throughout the                            procedure, the patient's blood pressure, pulse, and                             oxygen saturations were monitored continuously. The                            [id number]-HQ190L ([id number]) Olympus colonoscope was                            introduced through the anus and advanced to the 2                            cm into the ileum. The colonoscopy was performed                            without difficulty. The patient tolerated the                            procedure well. The quality of the bowel                            preparation was good. The terminal ileum, ileocecal                            valve, appendiceal orifice, and rectum were                            photographed. Scope In: 7:45:29 AM Scope Out: 7:59:47 AM Scope Withdrawal Time: 0 hours 10 minutes 59 seconds  Total Procedure Duration: 0 hours 14 minutes 18 seconds  Findings:      A circumferential, fungating, infiltrative, polypoid and ulcerated       partially obstructing (could pass peds colon with minimal pressure) 5 cm       mass was found in the distal descending/Px sigmoid colon extending from       35 up to 40 cm from the anal verge. This was biopsied with a cold       forceps for histology. Area, distal to the mass, was tattooed with an       injection of 2 mL of [place] ink.      Non-bleeding internal hemorrhoids were found during retroflexion. The       hemorrhoids were small and Grade I (internal hemorrhoids that do not  prolapse).      The terminal ileum appeared normal.      The exam was otherwise without abnormality on direct and retroflexion       views. 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 Biopsied.                            Tattooed.                           - Non-bleeding internal hemorrhoids.                           - The examined portion of the ileum was normal.                           - The examination was otherwise normal on direct                             and retroflexion views. Moderate Sedation:      Not Applicable - Patient had care per Anesthesia. Recommendation:           - Patient has a contact number available for                            emergencies. The signs and symptoms of potential                            delayed complications were discussed with the                            patient. Return to normal activities tomorrow.                            Written discharge instructions were provided to the                            patient.                           - Resume previous diet.                           - Continue present medications.                           - Await pathology results ([facility])                           - Check CBC, CMP, CEA                           - May need CT chest Abdo/pelvis again                           -  The findings and recommendations were discussed                            with the patient's family. Procedure Code(s):        --- Professional ---                           [phone], Colonoscopy, flexible; with biopsy, single                            or multiple                           [id number], Colonoscopy, flexible; with directed                            submucosal injection(s), any substance Diagnosis Code(s):        --- Professional ---                           D49.0, Neoplasm of unspecified behavior of                            digestive system                           K56.690, Other partial intestinal obstruction                           K64.0, First degree hemorrhoids                           D50.9, Iron deficiency anemia, unspecified CPT copyright 2022 American Medical Association. All rights reserved. The codes documented in this report are preliminary and upon coder review may  be revised to meet current compliance requirements. Lynann Bologna, MD 09/11/2023 8:21:57 AM This report has been signed electronically. Number of Addenda: 0

## 2023-09-12 ENCOUNTER — Other Ambulatory Visit: Payer: Self-pay

## 2023-09-12 LAB — CEA: CEA: 2.4 ng/mL (ref 0.0–4.7)

## 2023-09-12 MED ORDER — POTASSIUM CHLORIDE CRYS ER 20 MEQ PO TBCR: 20.0000 meq | EXTENDED_RELEASE_TABLET | Freq: Every day | ORAL | 0 refills | Status: DC

## 2023-09-12 NOTE — Progress Notes (Signed)
 Patient made aware to follow up with PCP for refills

## 2023-09-13 ENCOUNTER — Encounter (HOSPITAL_COMMUNITY): Payer: Self-pay | Admitting: Gastroenterology

## 2023-09-13 ENCOUNTER — Other Ambulatory Visit: Payer: Self-pay

## 2023-09-13 DIAGNOSIS — C801 Malignant (primary) neoplasm, unspecified: Secondary | ICD-10-CM

## 2023-09-13 NOTE — Progress Notes (Signed)
 I have discussed with the patient in detail Dx-adenocarcinoma Mild anemia Normal CEA  Plan: -CT chest/Abdo/pelvis -Appointment with colorectal surgery -Also, oncology navigator please  Send report to family physician

## 2023-09-14 ENCOUNTER — Telehealth: Payer: Self-pay | Admitting: Gastroenterology

## 2023-09-14 LAB — SURGICAL PATHOLOGY

## 2023-09-14 NOTE — Telephone Encounter (Signed)
 Hey, just wanted to let you know that I started case authorization for her CT scan yesterday 3/12 but the case is still pending as of today.

## 2023-09-14 NOTE — Telephone Encounter (Signed)
 Strange RE: colon cancer staging RG

## 2023-09-15 ENCOUNTER — Telehealth: Payer: Self-pay | Admitting: Gastroenterology

## 2023-09-15 NOTE — Telephone Encounter (Signed)
 Pt stated that her CT scan had to be rescheduled due to the PA. Currently scheduled for 09/20/2023 at 7:00 PM.  Routed as [facility]

## 2023-09-15 NOTE — Telephone Encounter (Signed)
 Thx Pl make appt with colorectal surgery and onc navigator as well RG

## 2023-09-15 NOTE — Telephone Encounter (Signed)
 Patient is calling because her insurance had called her stating that she needed to get in touch with [place] in order to have [place] send more clinical notes sent over to them, so they are able to cover her CT scan of her chest and abdomen. Patient is requesting a call back. Please advise.

## 2023-09-15 NOTE — Telephone Encounter (Signed)
 Chart reviewed and noted the previous documentation from 09/13/2023    "Referral and records sent to [facility] colorectal surgery. Pt made aware. Pt notified that they will contact the pt and if she has not heard from there office in 1-2 weeks then to give our office a call.   Referral to Oncology Navigator placed in epic. Pt made aware. Pt notified that they will contact the pt and if she has not heard from there office in 1-2 weeks then to give our office a call.   Pt verbalized understanding with all questions answered.":

## 2023-09-15 NOTE — Telephone Encounter (Signed)
 Expedited the request will check on status of the case later this evening. No guarantee the MD will have it reviewed by then.

## 2023-09-16 ENCOUNTER — Ambulatory Visit (HOSPITAL_COMMUNITY)

## 2023-09-16 ENCOUNTER — Encounter (HOSPITAL_COMMUNITY): Payer: Self-pay

## 2023-09-18 NOTE — Progress Notes (Unsigned)
 PATIENT NAVIGATOR PROGRESS NOTE  Name: Ashley [name] Date: 09/18/2023 MRN: [id number]  DOB: [birth year]   Reason for visit:  Introductory Phone Call  Comments:   Called patient to inform her that our office has received a referral from Dr. Lynann Bologna.  Patient was scheduled for CT on 3/19, but patient stated she was just informed it was denied.  Encouraged patient to follow-up with Dr. Urban Gibson office regarding scan.  Informed patient her referral to be seen in our clinic will be deferred until after her CT scan has been completed.  Patient given my contact information and instructed to contact our office with any questions or concerns. Patient verbalized understanding.  Will follow-up with patient once scan has been completed.     Time spent counseling/coordinating care: 15-30 minutes

## 2023-09-18 NOTE — Telephone Encounter (Signed)
 Inbound call from patient, states her insurance will not cover her CT, so it had to be cancelled. Patient would like to discuss further plan of care with a nurse.

## 2023-09-18 NOTE — Telephone Encounter (Signed)
 Call pt  to speak to them about the procedure but no answer.

## 2023-09-19 NOTE — Telephone Encounter (Signed)
 Ashley [name], [name] A3 minutes ago (1:32 PM)   Spoke with rep and was informed the case has been denied please advise if an appeal needs to be started.     I have requested that [name] initiate appeal.

## 2023-09-19 NOTE — Telephone Encounter (Signed)
 Patient returned call. Pt states that she spoke with her insurance and they received records. Pt has been advised as of yesterday records were under review, we will have pre-authorization team reach out for an update later today. Patient is aware that once CT authorization is obtained we will let her know so that CT scan can be rescheduled. No other plans at this time.

## 2023-09-19 NOTE — Telephone Encounter (Signed)
 Spoke with rep and was informed the case has been denied please advise if an appeal needs to be started.

## 2023-09-19 NOTE — Telephone Encounter (Signed)
Unable to reach pt. Voice mailbox not set up yet:  

## 2023-09-19 NOTE — Telephone Encounter (Signed)
 See 3/14 telephone encounter for additional details.

## 2023-09-20 ENCOUNTER — Encounter (HOSPITAL_COMMUNITY): Payer: Self-pay

## 2023-09-20 ENCOUNTER — Ambulatory Visit (HOSPITAL_COMMUNITY)

## 2023-09-20 NOTE — Telephone Encounter (Signed)
 Patient called to advise her insurance approved CT.

## 2023-09-20 NOTE — Telephone Encounter (Signed)
 Pt called in and informed us that her CT scan has been approved. I provided patient with phone number to radiology scheduling to reschedule CT at her convenience. Patient verbalized understanding and had no concerns at the end of the call.

## 2023-09-29 ENCOUNTER — Other Ambulatory Visit: Payer: Self-pay

## 2023-09-29 ENCOUNTER — Ambulatory Visit (HOSPITAL_COMMUNITY)
Admission: RE | Admit: 2023-09-29 | Discharge: 2023-09-29 | Disposition: A | Discharge status: Home / Self Care | Source: Ambulatory Visit | Attending: Gastroenterology | Admitting: Gastroenterology

## 2023-09-29 DIAGNOSIS — R160 Hepatomegaly, not elsewhere classified: Secondary | ICD-10-CM | POA: Diagnosis not present

## 2023-09-29 DIAGNOSIS — C801 Malignant (primary) neoplasm, unspecified: Secondary | ICD-10-CM | POA: Diagnosis not present

## 2023-09-29 DIAGNOSIS — C787 Secondary malignant neoplasm of liver and intrahepatic bile duct: Secondary | ICD-10-CM | POA: Diagnosis not present

## 2023-09-29 DIAGNOSIS — C186 Malignant neoplasm of descending colon: Secondary | ICD-10-CM | POA: Diagnosis not present

## 2023-09-29 DIAGNOSIS — K6389 Other specified diseases of intestine: Secondary | ICD-10-CM

## 2023-09-29 MED ORDER — SODIUM CHLORIDE (PF) 0.9 % IJ SOLN
INTRAMUSCULAR | Status: AC
  Filled 2023-09-29: qty 50

## 2023-09-29 MED ORDER — IOHEXOL 300 MG/ML  SOLN
100.0000 mL | Freq: Once | INTRAMUSCULAR | Status: AC | PRN
  Administered 2023-09-29: 100 mL via INTRAVENOUS

## 2023-09-29 MED ORDER — IOHEXOL 9 MG/ML PO SOLN
ORAL | Status: AC
  Filled 2023-09-29: qty 1000

## 2023-09-29 MED ORDER — IOHEXOL 9 MG/ML PO SOLN
500.0000 mL | ORAL | Status: AC
  Administered 2023-09-29 (×2): 500 mL via ORAL

## 2023-09-29 NOTE — Progress Notes (Signed)
 PATIENT NAVIGATOR PROGRESS NOTE  Name: Ashley [name] Date: 09/29/2023 MRN: [id number]  DOB: October 24, 1985   Reason for visit:  CT scan complete  Comments:    Called patient and verified her CT scan was completed today, results still pending.  Patient stated she is also scheduled with [facility] on 4/21, but unsure which surgeon she would be seeing. Patient scheduled for initial appointment with Vincent Gros, NP on 10/04/23 at 9:30am.  Patient given instructions on where our office is location.  Patient was also given my contact information for patient to call with any questions.  Patient verbalized understanding.        Time spent counseling/coordinating care: 30-45 minutes

## 2023-09-30 DIAGNOSIS — G4733 Obstructive sleep apnea (adult) (pediatric): Secondary | ICD-10-CM | POA: Diagnosis not present

## 2023-09-30 DIAGNOSIS — J9611 Chronic respiratory failure with hypoxia: Secondary | ICD-10-CM | POA: Diagnosis not present

## 2023-10-01 DIAGNOSIS — C186 Malignant neoplasm of descending colon: Secondary | ICD-10-CM | POA: Insufficient documentation

## 2023-10-01 NOTE — Assessment & Plan Note (Signed)
 history of constipation and colitis.  On 09/11/2023, the patient underwent colonoscopy and endoscopy per Dr. Chales Abrahams.  Endoscopy showed single GE polyp, gastritis, and small hiatal hernia.  Colonoscopy showed likely malignant, obstructive tumor in the distal descending/proximal sigmoid colon.  There were also nonbleeding hemorrhoids.  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  MMR immunostain was normal.  MLH1, MSH2, MSH6, and PMS2 all showed preserved nuclear expression.  MSI testing is recommended.  Of note, patient did undergo a CT abdomen pelvis on 04/05/2023 which was positive for colitis in the mid descending colon. -CT CAP with contrast was done 09/29/2023.

## 2023-10-01 NOTE — Progress Notes (Unsigned)
 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  On 09/11/2023, the patient underwent colonoscopy and endoscopy per Dr. Chales Abrahams.  Endoscopy showed single GE polyp, gastritis, and small hiatal hernia.  Colonoscopy showed likely malignant, obstructive tumor in the distal descending/proximal sigmoid colon.  There were also nonbleeding hemorrhoids.  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  MMR immunostain was normal.  MLH1, MSH2, MSH6, and PMS2 all showed preserved nuclear expression.  MSI testing is recommended.  Of note, patient did undergo a CT abdomen pelvis on 04/05/2023 which was positive for colitis in the mid descending colon.  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   Irregular periods/menstrual cycles 01/30/2012   Late prenatal care 01/30/2012   Poor social situation 01/30/2012   Previous cesarean section complicating pregnancy, antepartum condition or complication 01/27/2016   2 previous cesarean section- Desires repeat with BTL   Single liveborn, born in hospital, delivered by cesarean delivery 08/13/2016   Status post repeat low transverse cesarean section 08/14/2016   Status post tubal ligation at time of delivery, current hosp 08/14/2016    SURGICAL HISTORY: Past Surgical History:  Procedure Laterality Date   CESAREAN SECTION  2009   CESAREAN SECTION  01/30/2012   Procedure: CESAREAN  SECTION;  Surgeon: Michael Litter, MD;  [facility];  Service: Gynecology;  Laterality: N/A;  Repeat C/S   CESAREAN SECTION WITH BILATERAL TUBAL LIGATION Bilateral 08/13/2016   Procedure: CESAREAN SECTION WITH BILATERAL TUBAL LIGATION;  Surgeon: Lesly Dukes, MD;  Location: [facility] BIRTH[facility] SUIT[facility];  Service: Obstetrics;  Laterality: Bilateral;   CHOLECYSTECTOMY N/A 10/16/2013   Procedure: LAPAROSCOPIC CHOLECYSTECTOMY WITH INTRAOPERATIVE CHOLANGIOGRAM;  Surgeon: Wilmon Arms. Corliss Skains, MD;  [facility]: [facility];  Service: General;  Laterality: N/A;   COLONOSCOPY WITH PROPOFOL N/A 09/11/2023   Procedure: COLONOSCOPY WITH PROPOFOL;  Surgeon: Lynann Bologna, MD;  [facility];  Service: Gastroenterology;  Laterality: N/A;   DILATION AND EVACUATION N/A 07/28/2015   Procedure: SUCTION, DILATATION AND EVACUATION;  Surgeon: Brock Bad, MD;  Location: [facility];  Service: Gynecology;  Laterality: N/A;   ESOPHAGOGASTRODUODENOSCOPY (EGD) WITH PROPOFOL N/A 09/11/2023   Procedure: ESOPHAGOGASTRODUODENOSCOPY (EGD) WITH PROPOFOL;  Surgeon: Lynann Bologna, MD;  [facility];  Service: Gastroenterology;  Laterality: N/A;   SUBMUCOSAL INJECTION  09/11/2023   Procedure: INJECTION, SUBMUCOSAL;  Surgeon: Lynann Bologna, MD;  [facility];  Service: Gastroenterology;;   TONSILLECTOMY  AGE 1   TONSILLECTOMY AND ADENOIDECTOMY  AGEB 18    SOCIAL HISTORY: Social History   Socioeconomic History   Marital status: Single    Spouse name: Not on file   Number of children: 3   Years of education: 12   Highest education level: Not on file  Occupational History   Occupation: lift driver  Tobacco Use   Smoking status: Former    Current packs/day: 0.00    Types: Cigarettes    Quit date:  10/31/2009    Years since quitting: 13.9   Smokeless tobacco: Never  Vaping Use   Vaping status: Every Day  Substance and Sexual Activity   Alcohol use: No    Alcohol/week: 0.0 standard drinks of alcohol   Drug  use: No   Sexual activity: Yes    Partners: Male    Birth control/protection: None, Surgical    Comment: BTL 08/2016  Other Topics Concern   Not on file  Social History Narrative   Not on file   Social Drivers of Health   Financial Resource Strain: Not on file  Food Insecurity: Not on file  Transportation Needs: Not on file  Physical Activity: Not on file  Stress: Not on file  Social Connections: Not on file  Intimate Partner Violence: Not on file    FAMILY HISTORY: Family History  Problem Relation Age of Onset   Asthma Mother    Hypertension Mother    Early death Mother        AGE 21   Cancer Maternal Grandmother    Liver disease Neg Hx    Esophageal cancer Neg Hx     ALLERGIES:  is allergic to fruit extracts.  MEDICATIONS:  Current Outpatient Medications  Medication Sig Dispense Refill   amLODipine (NORVASC) 10 MG tablet Take 10 mg by mouth daily. (Patient not taking: Reported on 07/18/2023)     hydrochlorothiazide (HYDRODIURIL) 25 MG tablet Take 25 mg by mouth daily. (Patient not taking: Reported on 07/18/2023)     hyoscyamine (LEVSIN SL) 0.125 MG SL tablet Place 1 tablet (0.125 mg total) under the tongue every 6 (six) hours as needed for cramping (nausea, diarrhea). 50 tablet 1   ibuprofen (ADVIL) 800 MG tablet Take 800 mg by mouth 3 (three) times daily.     meloxicam (MOBIC) 15 MG tablet Take 15 mg by mouth daily.     omeprazole (PRILOSEC OTC) 20 MG tablet Take 1 tablet (20 mg total) by mouth daily. 28 tablet 1   ondansetron (ZOFRAN-ODT) 4 MG disintegrating tablet Take 1 tablet (4 mg total) by mouth every 8 (eight) hours as needed for nausea or vomiting. (Patient not taking: Reported on 07/18/2023) 20 tablet 0   potassium chloride SA (KLOR-CON M) 20 MEQ tablet Take 1 tablet (20 mEq total) by mouth daily. Please follow up with your primary care doctor regarding additional refills. 30 tablet 0   No current facility-administered medications for this visit.    REVIEW OF  SYSTEMS:   Constitutional: Denies fevers, chills or abnormal night sweats Eyes: Denies blurriness of vision, double vision or watery eyes Ears, nose, mouth, throat, and face: Denies mucositis or sore throat Respiratory: Denies cough, dyspnea or wheezes Cardiovascular: Denies palpitation, chest discomfort or lower extremity swelling Gastrointestinal:  Denies nausea, heartburn or change in bowel habits Skin: Denies abnormal skin rashes Lymphatics: Denies new lymphadenopathy or easy bruising Neurological:Denies numbness, tingling or new weaknesses Behavioral/Psych: Mood is stable, no new changes  All other systems were reviewed with the patient and are negative.  PHYSICAL EXAMINATION: ECOG PERFORMANCE STATUS: {CHL ONC ECOG PS:[id number]}  There were no vitals filed for this visit. There were no vitals filed for this visit.  GENERAL:alert, no distress and comfortable SKIN: skin color, texture, turgor are normal, no rashes or significant lesions EYES: normal, conjunctiva are pink and non-injected, sclera clear OROPHARYNX:no exudate, no erythema and lips, buccal mucosa, and tongue normal  NECK: supple, thyroid normal size, non-tender, without nodularity LYMPH:  no palpable lymphadenopathy in the  cervical, axillary or inguinal LUNGS: clear to auscultation and percussion with normal breathing effort HEART: regular rate & rhythm and no murmurs and no lower extremity edema ABDOMEN:abdomen soft, non-tender and normal bowel sounds Musculoskeletal:no cyanosis of digits and no clubbing  PSYCH: alert & oriented x 3 with fluent speech NEURO: no focal motor/sensory deficits  LABORATORY DATA:  I have reviewed the data as listed Lab Results  Component Value Date   WBC 5.3 09/11/2023   HGB 11.5 (L) 09/11/2023   HCT 36.6 09/11/2023   MCV 79.2 (L) 09/11/2023   PLT 249 09/11/2023   Recent Labs    04/04/23 1653 07/18/23 0936 09/11/23 0753  NA 136 139 138  K 3.3* 3.8 3.1*  CL 98 103 105  CO2  27 29 26   GLUCOSE 93 80 100*  BUN 5* 8 5*  CREATININE 0.65 0.58 0.63  CALCIUM 8.7* 8.6 8.8*  GFRNONAA >60  --  >60  PROT 6.7 7.2 7.7  ALBUMIN 3.0* 3.8 3.7  AST 12* 7 14*  ALT 12 7 14   ALKPHOS 60 66 58  BILITOT 0.7 0.4 0.9    RADIOGRAPHIC STUDIES: I have personally reviewed the radiological images as listed and agreed with the findings in the report. No results found.  ASSESSMENT & PLAN:  ***  Recommendations:   All questions were answered. The patient knows to call the clinic with any problems, questions or concerns.      Carlean Jews, NP 10/01/2023 8:17 PM

## 2023-10-03 DIAGNOSIS — C801 Malignant (primary) neoplasm, unspecified: Secondary | ICD-10-CM

## 2023-10-03 HISTORY — DX: Malignant (primary) neoplasm, unspecified: C80.1

## 2023-10-04 ENCOUNTER — Inpatient Hospital Stay

## 2023-10-04 ENCOUNTER — Inpatient Hospital Stay: Attending: Nurse Practitioner | Admitting: Nurse Practitioner

## 2023-10-04 VITALS — BP 124/68 | HR 73 | Temp 98.0°F | Resp 18 | Ht 63.0 in | Wt 254.8 lb

## 2023-10-04 DIAGNOSIS — Z5189 Encounter for other specified aftercare: Secondary | ICD-10-CM | POA: Insufficient documentation

## 2023-10-04 DIAGNOSIS — Z79631 Long term (current) use of antimetabolite agent: Secondary | ICD-10-CM | POA: Insufficient documentation

## 2023-10-04 DIAGNOSIS — C186 Malignant neoplasm of descending colon: Secondary | ICD-10-CM

## 2023-10-04 DIAGNOSIS — Z87891 Personal history of nicotine dependence: Secondary | ICD-10-CM | POA: Diagnosis not present

## 2023-10-04 DIAGNOSIS — C787 Secondary malignant neoplasm of liver and intrahepatic bile duct: Secondary | ICD-10-CM | POA: Insufficient documentation

## 2023-10-04 DIAGNOSIS — Z79634 Long term (current) use of topoisomerase inhibitor: Secondary | ICD-10-CM | POA: Diagnosis not present

## 2023-10-04 DIAGNOSIS — Z5111 Encounter for antineoplastic chemotherapy: Secondary | ICD-10-CM | POA: Diagnosis not present

## 2023-10-04 DIAGNOSIS — Z79630 Long term (current) use of alkylating agent: Secondary | ICD-10-CM | POA: Insufficient documentation

## 2023-10-04 DIAGNOSIS — Z809 Family history of malignant neoplasm, unspecified: Secondary | ICD-10-CM | POA: Insufficient documentation

## 2023-10-04 DIAGNOSIS — D649 Anemia, unspecified: Secondary | ICD-10-CM | POA: Diagnosis not present

## 2023-10-04 LAB — IRON AND IRON BINDING CAPACITY (CC-WL,HP ONLY)
Iron: 40 ug/dL (ref 28–170)
Saturation Ratios: 10 % — ABNORMAL LOW (ref 10.4–31.8)
TIBC: 388 ug/dL (ref 250–450)
UIBC: 348 ug/dL (ref 148–442)

## 2023-10-04 LAB — CMP (CANCER CENTER ONLY)
ALT: 7 U/L (ref 0–44)
AST: 8 U/L — ABNORMAL LOW (ref 15–41)
Albumin: 3.9 g/dL (ref 3.5–5.0)
Alkaline Phosphatase: 67 U/L (ref 38–126)
Anion gap: 6 (ref 5–15)
BUN: 7 mg/dL (ref 6–20)
CO2: 28 mmol/L (ref 22–32)
Calcium: 9.2 mg/dL (ref 8.9–10.3)
Chloride: 103 mmol/L (ref 98–111)
Creatinine: 0.58 mg/dL (ref 0.44–1.00)
GFR, Estimated: >60 mL/min (ref >60)
Glucose, Bld: 87 mg/dL (ref 70–99)
Potassium: 3.8 mmol/L (ref 3.5–5.1)
Sodium: 137 mmol/L (ref 135–145)
Total Bilirubin: 0.6 mg/dL (ref 0.0–1.2)
Total Protein: 7.7 g/dL (ref 6.5–8.1)

## 2023-10-04 LAB — CBC WITH DIFFERENTIAL (CANCER CENTER ONLY)
Abs Immature Granulocytes: 0.02 10*3/uL (ref 0.00–0.07)
Basophils Absolute: 0 10*3/uL (ref 0.0–0.1)
Basophils Relative: 0 %
Eosinophils Absolute: 0.1 10*3/uL (ref 0.0–0.5)
Eosinophils Relative: 1 %
HCT: 33.7 % — ABNORMAL LOW (ref 36.0–46.0)
Hemoglobin: 10.8 g/dL — ABNORMAL LOW (ref 12.0–15.0)
Immature Granulocytes: 0 %
Lymphocytes Relative: 28 %
Lymphs Abs: 2 10*3/uL (ref 0.7–4.0)
MCH: 24.8 pg — ABNORMAL LOW (ref 26.0–34.0)
MCHC: 32 g/dL (ref 30.0–36.0)
MCV: 77.3 fL — ABNORMAL LOW (ref 80.0–100.0)
Monocytes Absolute: 0.5 10*3/uL (ref 0.1–1.0)
Monocytes Relative: 7 %
Neutro Abs: 4.5 10*3/uL (ref 1.7–7.7)
Neutrophils Relative %: 64 %
Platelet Count: 271 10*3/uL (ref 150–400)
RBC: 4.36 MIL/uL (ref 3.87–5.11)
RDW: 14.3 % (ref 11.5–15.5)
WBC Count: 7.1 10*3/uL (ref 4.0–10.5)
nRBC: 0 % (ref 0.0–0.2)

## 2023-10-04 LAB — FOLATE: Folate: 8.4 ng/mL (ref >5.9)

## 2023-10-04 LAB — FERRITIN: Ferritin: 28 ng/mL (ref 11–307)

## 2023-10-04 NOTE — Progress Notes (Signed)
 PATIENT NAVIGATOR PROGRESS NOTE  Name: Ashley [name] Date: 10/04/2023 MRN: [id number]  DOB: 1986-06-07   Reason for visit:  New Patient Visit with Vincent Gros, NP   Comments:   Patient was seen in office along with Vincent Gros, NP.  Patient given Journey's binder with education specific to her diagnosis.  Patient was also given my direct contact information and was advised to contact the office if she had any additional questions after her visit today.  The following patient's need were assessed:  Transportation--Patient denied any transportation issues at this time. Requested patient to let office know if this changed.  Social Work--Social work to follow-up with patient later this week.  Nutrition--Referral entered by Vincent Gros, NP for nutrition.  Will continue to follow patient.   Time spent counseling/coordinating care: > 60 minutes

## 2023-10-05 ENCOUNTER — Other Ambulatory Visit: Payer: Self-pay

## 2023-10-05 ENCOUNTER — Encounter: Payer: Self-pay | Admitting: Surgery

## 2023-10-05 DIAGNOSIS — K56699 Other intestinal obstruction unspecified as to partial versus complete obstruction: Secondary | ICD-10-CM | POA: Insufficient documentation

## 2023-10-05 DIAGNOSIS — E66813 Obesity, class 3: Secondary | ICD-10-CM | POA: Insufficient documentation

## 2023-10-05 DIAGNOSIS — R160 Hepatomegaly, not elsewhere classified: Secondary | ICD-10-CM | POA: Insufficient documentation

## 2023-10-06 ENCOUNTER — Ambulatory Visit: Payer: Medicaid Other | Admitting: Internal Medicine

## 2023-10-06 ENCOUNTER — Encounter: Payer: Self-pay | Admitting: Internal Medicine

## 2023-10-06 ENCOUNTER — Inpatient Hospital Stay: Admitting: Licensed Clinical Social Worker

## 2023-10-06 VITALS — BP 124/72 | HR 87 | Ht 63.0 in | Wt 251.0 lb

## 2023-10-06 DIAGNOSIS — E010 Iodine-deficiency related diffuse (endemic) goiter: Secondary | ICD-10-CM | POA: Diagnosis not present

## 2023-10-06 DIAGNOSIS — E0590 Thyrotoxicosis, unspecified without thyrotoxic crisis or storm: Secondary | ICD-10-CM

## 2023-10-06 NOTE — Progress Notes (Signed)
 Name: Ashley [name]  MRN/ DOB: [id number], 03-27-1986    Age/ Sex: 38 y.o., female    PCP: Leilani Able, MD   Reason for Endocrinology Evaluation: Thyromegaly     Date of Initial Endocrinology Evaluation: 10/06/2023     HPI: Ms. Ashley [name] is a 38 y.o. female with a past medical history of obesity, OSA on BiPAP, chronic respiratory failure with hypoxia, adenocarcinoma of the colon (Dx 09/2023). The patient presented for initial endocrinology clinic visit on 10/06/2023 for consultative assistance with her thyromegaly.   Patient has been noted with thyromegaly on physical exam, prompting thyroid ultrasound   Patient has been following up with GI for colitis and chronic constipation, she is s/p colonoscopy 09/2023 and was diagnosed with adenocarcinoma ,she has a pending referral to surgery   The patient was noted with a low TSH at 0.19u IU/mL on 07/18/2023   Pt has been noted with weight loss over the past year that has been attributed to adenocarcinoma of the colon  She has not noted any local neck swelling  No palpitations  No tremors  Continues with constipation  No XRT or anterior neck sx  No anxiety or  jittery sensation  Denies eye symptoms  No Biotin    Maternal GM with colon cancer  No FH of thyroid disease    HISTORY:  Past Medical History:  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   Irregular periods/menstrual cycles 01/30/2012   Late prenatal care 01/30/2012   Poor social situation 01/30/2012   Previous cesarean section complicating pregnancy, antepartum condition or complication 01/27/2016   2 previous cesarean section- Desires repeat with BTL   Single liveborn, born in hospital, delivered by cesarean delivery 08/13/2016   Status post repeat low transverse cesarean section 08/14/2016   Status post tubal ligation at time of  delivery, current hosp 08/14/2016   Past Surgical History:  Past Surgical History:  Procedure Laterality Date   CESAREAN SECTION  2009   CESAREAN SECTION  01/30/2012   Procedure: CESAREAN SECTION;  Surgeon: Michael Litter, MD;  [facility]: [facility];  Service: Gynecology;  Laterality: N/A;  Repeat C/S   CESAREAN SECTION WITH BILATERAL TUBAL LIGATION Bilateral 08/13/2016   Procedure: CESAREAN SECTION WITH BILATERAL TUBAL LIGATION;  Surgeon: Lesly Dukes, MD;  Location: [facility]TH[facility] SU[facility];  Service: Obstetrics;  Laterality: Bilateral;   CHOLECYSTECTOMY N/A 10/16/2013   Procedure: LAPAROSCOPIC CHOLECYSTECTOMY WITH INTRAOPERATIVE CHOLANGIOGRAM;  Surgeon: Wilmon Arms. Corliss Skains, MD;  Location: [facility];  Service: General;  Laterality: N/A;   COLONOSCOPY WITH PROPOFOL N/A 09/11/2023   Procedure: COLONOSCOPY WITH PROPOFOL;  Surgeon: Lynann Bologna, MD;  [facility];  Service: Gastroenterology;  Laterality: N/A;   DILATION AND EVACUATION N/A 07/28/2015   Procedure: SUCTION, DILATATION AND EVACUATION;  Surgeon: Brock Bad, MD;  Location: [facility];  Service: Gynecology;  Laterality: N/A;   ESOPHAGOGASTRODUODENOSCOPY (EGD) WITH PROPOFOL N/A 09/11/2023   Procedure: ESOPHAGOGASTRODUODENOSCOPY (EGD) WITH PROPOFOL;  Surgeon: Lynann Bologna, MD;  [facility];  Service: Gastroenterology;  Laterality: N/A;   SUBMUCOSAL INJECTION  09/11/2023   Procedure: INJECTION, SUBMUCOSAL;  Surgeon: Lynann Bologna, MD;  [facility];  Service: Gastroenterology;;   TONSILLECTOMY  AGE 45   TONSILLECTOMY AND ADENOIDECTOMY  AGEB 18    Social History:  reports that she quit smoking  about 13 years ago. Her smoking use included cigarettes. She has never used smokeless tobacco. She reports that she does not drink alcohol and does not use drugs. Family History: family history includes Asthma in her mother; Cancer in her maternal grandmother; Early death in her mother; Hypertension in her mother.   HOME  MEDICATIONS: Allergies as of 10/06/2023       Reactions   Fruit Extracts Itching   Fresh Fruit         Medication List        Accurate as of October 06, 2023 10:27 AM. If you have any questions, ask your nurse or doctor.          amLODipine 10 MG tablet Commonly known as: NORVASC Take 10 mg by mouth daily.   hydrochlorothiazide 25 MG tablet Commonly known as: HYDRODIURIL Take 25 mg by mouth daily.   hyoscyamine 0.125 MG SL tablet Commonly known as: LEVSIN SL Place 1 tablet (0.125 mg total) under the tongue every 6 (six) hours as needed for cramping (nausea, diarrhea).   ibuprofen 800 MG tablet Commonly known as: ADVIL Take 800 mg by mouth 3 (three) times daily.   meloxicam 15 MG tablet Commonly known as: MOBIC Take 15 mg by mouth daily.   omeprazole 20 MG tablet Commonly known as: PriLOSEC OTC Take 1 tablet (20 mg total) by mouth daily.   ondansetron 4 MG disintegrating tablet Commonly known as: ZOFRAN-ODT Take 1 tablet (4 mg total) by mouth every 8 (eight) hours as needed for nausea or vomiting.   potassium chloride SA 20 MEQ tablet Commonly known as: KLOR-CON M Take 1 tablet (20 mEq total) by mouth daily. Please follow up with your primary care doctor regarding additional refills.   traMADol 50 MG tablet Commonly known as: ULTRAM Take 50 mg by mouth every 6 (six) hours as needed.          REVIEW OF SYSTEMS: A comprehensive ROS was conducted with the patient and is negative except as per HPI     OBJECTIVE:  VS: BP 124/72 (BP Location: Left Arm, Patient Position: Sitting, Cuff Size: Normal)   Pulse 87   Ht 5\' 3"  (1.6 m)   Wt 251 lb (113.9 kg)   SpO2 97%   BMI 44.46 kg/m    Wt Readings from Last 3 Encounters:  10/06/23 251 lb (113.9 kg)  10/04/23 254 lb 12.8 oz (115.6 kg)  09/11/23 245 lb (111.1 kg)     EXAM: General: Pt appears well and is in NAD  Eyes: External eye exam normal without stare, lid lag or exophthalmos.  EOM intact.  PERRL.   Neck: General: Supple without adenopathy. Thyroid: Thyroid gland ~ 90 g  Lungs: Clear with good BS bilat   Heart: Auscultation: RRR.  Abdomen: Soft, nontender  Extremities:  BL LE: No pretibial edema   Mental Status: Judgment, insight: Intact Orientation: Oriented to time, place, and person Mood and affect: No depression, anxiety, or agitation     DATA REVIEWED:  Latest Reference Range & Units 10/06/23 10:48  TSH mIU/L 0.75  Triiodothyronine,Free,Serum 2.3 - 4.2 pg/mL 3.9  T4,Free(Direct) 0.8 - 1.8 ng/dL 1.1  THYROID STIMULATING IMMUNOGLOBULIN  Rpt (IP)  (IP): In Process Rpt: View report in Results Review for more information    Latest Reference Range & Units 07/18/23 09:36  TSH 0.35 - 5.50 uIU/mL 0.19 (L)     Thyroid Ultrasound 07/28/2023   Estimated total number of nodules >/= 1 cm: 0  Number of spongiform nodules >/=  2 cm not described below (TR1): 0   Number of mixed cystic and solid nodules >/= 1.5 cm not described below (TR2): 0   _________________________________________________________   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 No normal thyroid tissue is visible. The masslike thyroid gland is composed entirely of confluent regions of general pseudo nodularity all appearing sonographically similar. No definite discrete thyroid nodule separate from the background thyroid parenchyma.   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    Old records , labs and images have been reviewed.    ASSESSMENT/PLAN/RECOMMENDATIONS:   Hyperthyroidism  -Patient is clinically euthyroid -Differential diagnosis includes Graves' disease versus autonomous thyroid nodule   -We discussed with pt the benefits of methimazole in the Tx of hyperthyroidism, as well as the possible side effects/complications of anti-thyroid drug Tx (specifically detailing the rare,  but serious side effect of agranulocytosis). She was informed of need for regular thyroid function monitoring while on methimazole to ensure appropriate dosage without over-treatment. As well, we discussed the possible side effects of methimazole including the chance of rash, the small chance of liver irritation/juandice and the <=1 in 300-400 chance of sudden onset agranulocytosis.  We discussed importance of going to ED promptly (and stopping methimazole) if shewere to develop significant fever with severe sore throat of other evidence of acute infection.     -We extensively discussed the various treatment options for hyperthyroidism and Graves disease including ablation therapy with radioactive iodine versus antithyroid drug treatment versus surgical therapy.  We recommended to the patient that we felt, at this time, that thionamide  therapy would be most optimal.   -Despite the lack of symptoms I have recommended treatment, we discussed the importance of euthyroidism prior to surgery to prevent complications such as thyroid storm  -TFTs have normalized and no need for treatment at this time   2. Thyromegaly :  -No local neck symptoms -Thyroid ultrasound showed Thyroid gland, no specific nodule's were described -Will repeat thyroid ultrasound by next visit -I did discuss with the patient that a total thyroidectomy would be a reasonable option given the size and to prevent compressive symptoms down the road -At this time she has a pending appointment with surgery for adenocarcinoma of the colon  Follow-up in 4 months    Signed electronically by: Lyndle Herrlich, MD  [facility] Endocrinology  [facility] [address] [place] [place], [place] [address] Phone: [phone] FAX: [phone]   CC: Leilani Able, MD [address] [place] [address] Phone: [phone] Fax: [phone]   Return to Endocrinology clinic as below: Future Appointments   Date Time Provider Department Center  10/06/2023 10:30 AM Jatavis Malek, Konrad Dolores, MD [facility] None  10/07/2023  1:00 PM [facility] 1 [facility] [place]  10/27/2023 11:00 AM [facility] ROOM [facility] None  10/27/2023  1:00 PM [facility] 2 [facility] [place]

## 2023-10-06 NOTE — Progress Notes (Signed)
 Gilmer Mor, DO  Claudean Kinds; P Ir Procedure Requests OK for US guided liver mass biopsy. Can proceed now.  [address]       Previous Messages    ----- Message ----- From: Claudean Kinds Sent: 10/04/2023  11:43 AM EDT To: Claudean Kinds; Ir Procedure Requests Subject: US biopsy ( liver)                            Procedure : US liver ( biopsy )  Reason: new diagnosis metastatic colon cancer to liver Dx: Adenocarcinoma of descending colon (HCC) [C18.6 (ICD-10-CM)]    History :  CT Chest abd pelv w. , US thyroid  Provider : Carlean Jews, NP  Provider contact : [phone]     --- MRI liver ordered but not yet done - will that need to be done first ?

## 2023-10-07 ENCOUNTER — Ambulatory Visit (HOSPITAL_COMMUNITY)
Admission: RE | Admit: 2023-10-07 | Discharge: 2023-10-07 | Disposition: A | Discharge status: Home / Self Care | Source: Ambulatory Visit | Attending: Nurse Practitioner | Admitting: Nurse Practitioner

## 2023-10-07 DIAGNOSIS — C186 Malignant neoplasm of descending colon: Secondary | ICD-10-CM | POA: Diagnosis not present

## 2023-10-07 MED ORDER — GADOBUTROL 1 MMOL/ML IV SOLN
10.0000 mL | Freq: Once | INTRAVENOUS | Status: AC | PRN
  Administered 2023-10-07: 10 mL via INTRAVENOUS

## 2023-10-09 ENCOUNTER — Encounter: Payer: Self-pay | Admitting: Internal Medicine

## 2023-10-09 DIAGNOSIS — E0590 Thyrotoxicosis, unspecified without thyrotoxic crisis or storm: Secondary | ICD-10-CM | POA: Insufficient documentation

## 2023-10-09 NOTE — Progress Notes (Signed)
 [facility] Clinical Social Work  Initial Assessment   Ashley [name] is a 38 y.o. year old female contacted by phone. Clinical Social Work was referred by medical provider for assessment of psychosocial needs.   SDOH (Social Determinants of Health) assessments performed: Yes   SDOH Screenings   Tobacco Use: Medium Risk (10/06/2023)     Distress Screen completed: No     No data to display            Family/Social Information:  Housing Arrangement: patient lives with her fiance and her 3 children who are ages 5, 56 and 34. Family members/support persons in your life? Pt reports she has a number of family members who reside nearby and will be able to assist if needed.   Transportation concerns: no  Employment: Unemployed - pt's fiance works for [employer].  Income source: Supported by [employer] and [employer] Financial concerns: Yes, current concerns Type of concern: Utilities and Biomedical scientist access concerns: no Religious or spiritual practice: Yes-[employer] Advanced directives:  No - pt informed of clinics available to complete forms. Services Currently in place:  none  Coping/ Adjustment to diagnosis: Patient understands treatment plan and what happens next? yes Concerns about diagnosis and/or treatment: How will I care for myself and Quality of life Patient reported stressors: Children and Adjusting to my illness Hopes and/or priorities: pt's priority is to start treatment w/ the hope of positive results Patient enjoys time with family/ friends Current coping skills/ strengths: Capable of independent living , Motivation for treatment/growth , and Physical Health     SUMMARY: Current SDOH Barriers:  Financial constraints related to limited income.  Clinical Social Work Clinical Goal(s):  Explore community resource options for unmet needs related to:  Financial Strain   Interventions: Discussed common feeling and emotions when being diagnosed with cancer, and the importance  of support during treatment Informed patient of the support team roles and support services at [facility] Provided CSW contact information and encouraged patient to call with any questions or concerns Referred patient to financial resources specialist to apply for the [employer], emailed resources for pt's children.  Informed of the clinics available to complete Advanced Directives.    Follow Up Plan: Patient will contact CSW with any support or resource needs Patient verbalizes understanding of plan: Yes    Rachel Moulds, LCSW Clinical Social Worker [facility]  Patient is participating in a Managed Medicaid Plan:  Yes

## 2023-10-10 ENCOUNTER — Other Ambulatory Visit: Payer: Self-pay

## 2023-10-10 ENCOUNTER — Other Ambulatory Visit: Payer: Self-pay | Admitting: Gastroenterology

## 2023-10-10 DIAGNOSIS — K760 Fatty (change of) liver, not elsewhere classified: Secondary | ICD-10-CM | POA: Insufficient documentation

## 2023-10-10 DIAGNOSIS — K56699 Other intestinal obstruction unspecified as to partial versus complete obstruction: Secondary | ICD-10-CM | POA: Diagnosis not present

## 2023-10-10 DIAGNOSIS — R109 Unspecified abdominal pain: Secondary | ICD-10-CM | POA: Diagnosis not present

## 2023-10-10 DIAGNOSIS — R160 Hepatomegaly, not elsewhere classified: Secondary | ICD-10-CM | POA: Diagnosis not present

## 2023-10-10 DIAGNOSIS — C186 Malignant neoplasm of descending colon: Secondary | ICD-10-CM | POA: Diagnosis not present

## 2023-10-10 LAB — TRAB (TSH RECEPTOR BINDING ANTIBODY): TRAB: 1.32 IU/L (ref <=2.00)

## 2023-10-10 LAB — T3, FREE: T3, Free: 3.9 pg/mL (ref 2.3–4.2)

## 2023-10-10 LAB — TSH: TSH: 0.75 m[IU]/L

## 2023-10-10 LAB — T4, FREE: Free T4: 1.1 ng/dL (ref 0.8–1.8)

## 2023-10-10 LAB — THYROID STIMULATING IMMUNOGLOBULIN: TSI: <89 %{baseline} (ref <140)

## 2023-10-11 ENCOUNTER — Encounter: Payer: Self-pay | Admitting: Nurse Practitioner

## 2023-10-11 ENCOUNTER — Other Ambulatory Visit: Payer: Self-pay

## 2023-10-11 ENCOUNTER — Encounter: Payer: Self-pay | Admitting: General Practice

## 2023-10-11 DIAGNOSIS — C186 Malignant neoplasm of descending colon: Secondary | ICD-10-CM

## 2023-10-11 DIAGNOSIS — K6389 Other specified diseases of intestine: Secondary | ICD-10-CM

## 2023-10-11 NOTE — Progress Notes (Signed)
 [facility] ordered on case#[id number].  Sent [facility] the requisition form.

## 2023-10-11 NOTE — Progress Notes (Signed)
 [facility] Spiritual Care Note  Referred by nursing for additional layer of support. Reached Ashley [name] by phone to introduce Spiritual Care as part of her support team and to build rapport.   Ashley [name] is working to process her new diagnosis and to live her best life in the midst of all the changes and vulnerability she is experiencing, and she is eager to start chemotherapy. She is also working to maintain a positive attitude as a role model for her three children (16, 11, 7).  Provided compassionate presence, reflective listening, normalization of feelings, emotional support, and introduction to services.   We plan to follow up by phone/in infusion for further support. Ashley [name] has direct Spiritual Care number in case needs arise in the meantime.   [address] [place], [place], [facility] Pager [phone] Voicemail [phone]

## 2023-10-11 NOTE — Progress Notes (Signed)
 PATIENT NAVIGATOR PROGRESS NOTE  Name: Ashley [name] Date: 10/11/2023 MRN: [id number]  DOB: [birth year]   Reason for visit:  Telephone (Appt Coordination)  Comments:   Dr. Mosetta Putt requested for patient's biopsy date on 4/25 to be moved up.  Biopsy was rescheduled for 4/18 at [facility].  Patient was notified and is agreeable to date, time, and location of biopsy.  Patient given verbal instructions for biopsy and also address of location of [facility].  Patient informed she would receive a phone call the day before the procedure with instructions for biopsy and also instructions on where to go the day of the procedure. Patient verbalized understanding.   Patient also stated she had her initial consult with a surgeon (Dr. Michaell Cowing) and that he had recommended patient do chemotherapy before surgery.  Patient is scheduled to see Dr. Mosetta Putt on 4/10 and was informed that her treatment options would be discussed at her appointment.  General chemo side effects were discussed and patient was informed she would attend a chemo education class (prior to the start of chemo) that would give more detailed education regarding her specific chemo treatment.  Patient was encouraged to write down any questions that she may have so that she can discuss them when she sees Dr. Mosetta Putt at her follow-up appointment.  Patient verbalized understanding.   Will follow-up with patient at her appointment with Dr. Mosetta Putt on 10/12/23.     Time spent counseling/coordinating care: 45-60 minutes

## 2023-10-12 ENCOUNTER — Ambulatory Visit: Admitting: Hematology

## 2023-10-12 ENCOUNTER — Other Ambulatory Visit

## 2023-10-12 ENCOUNTER — Inpatient Hospital Stay (HOSPITAL_BASED_OUTPATIENT_CLINIC_OR_DEPARTMENT_OTHER): Admitting: Hematology

## 2023-10-12 ENCOUNTER — Other Ambulatory Visit: Payer: Self-pay

## 2023-10-12 ENCOUNTER — Inpatient Hospital Stay: Admitting: Licensed Clinical Social Worker

## 2023-10-12 ENCOUNTER — Inpatient Hospital Stay

## 2023-10-12 ENCOUNTER — Other Ambulatory Visit: Payer: Self-pay | Admitting: Hematology

## 2023-10-12 VITALS — BP 124/67 | HR 76 | Temp 98.2°F | Resp 18 | Ht 63.0 in | Wt 250.5 lb

## 2023-10-12 DIAGNOSIS — Z5189 Encounter for other specified aftercare: Secondary | ICD-10-CM | POA: Diagnosis not present

## 2023-10-12 DIAGNOSIS — C186 Malignant neoplasm of descending colon: Secondary | ICD-10-CM | POA: Diagnosis not present

## 2023-10-12 DIAGNOSIS — Z5111 Encounter for antineoplastic chemotherapy: Secondary | ICD-10-CM | POA: Diagnosis not present

## 2023-10-12 DIAGNOSIS — D649 Anemia, unspecified: Secondary | ICD-10-CM | POA: Diagnosis not present

## 2023-10-12 DIAGNOSIS — Z79630 Long term (current) use of alkylating agent: Secondary | ICD-10-CM | POA: Diagnosis not present

## 2023-10-12 DIAGNOSIS — Z79634 Long term (current) use of topoisomerase inhibitor: Secondary | ICD-10-CM | POA: Diagnosis not present

## 2023-10-12 DIAGNOSIS — Z79631 Long term (current) use of antimetabolite agent: Secondary | ICD-10-CM | POA: Diagnosis not present

## 2023-10-12 DIAGNOSIS — Z809 Family history of malignant neoplasm, unspecified: Secondary | ICD-10-CM | POA: Diagnosis not present

## 2023-10-12 DIAGNOSIS — K6389 Other specified diseases of intestine: Secondary | ICD-10-CM

## 2023-10-12 DIAGNOSIS — C787 Secondary malignant neoplasm of liver and intrahepatic bile duct: Secondary | ICD-10-CM | POA: Diagnosis not present

## 2023-10-12 DIAGNOSIS — Z87891 Personal history of nicotine dependence: Secondary | ICD-10-CM | POA: Diagnosis not present

## 2023-10-12 LAB — CMP (CANCER CENTER ONLY)
ALT: 7 U/L (ref 0–44)
AST: 8 U/L — ABNORMAL LOW (ref 15–41)
Albumin: 3.9 g/dL (ref 3.5–5.0)
Alkaline Phosphatase: 58 U/L (ref 38–126)
Anion gap: 5 (ref 5–15)
BUN: 9 mg/dL (ref 6–20)
CO2: 30 mmol/L (ref 22–32)
Calcium: 9.2 mg/dL (ref 8.9–10.3)
Chloride: 104 mmol/L (ref 98–111)
Creatinine: 0.63 mg/dL (ref 0.44–1.00)
GFR, Estimated: >60 mL/min (ref >60)
Glucose, Bld: 101 mg/dL — ABNORMAL HIGH (ref 70–99)
Potassium: 4.4 mmol/L (ref 3.5–5.1)
Sodium: 139 mmol/L (ref 135–145)
Total Bilirubin: 0.6 mg/dL (ref 0.0–1.2)
Total Protein: 7.5 g/dL (ref 6.5–8.1)

## 2023-10-12 LAB — CBC WITH DIFFERENTIAL (CANCER CENTER ONLY)
Abs Immature Granulocytes: 0.01 10*3/uL (ref 0.00–0.07)
Basophils Absolute: 0 10*3/uL (ref 0.0–0.1)
Basophils Relative: 0 %
Eosinophils Absolute: 0.1 10*3/uL (ref 0.0–0.5)
Eosinophils Relative: 1 %
HCT: 31.5 % — ABNORMAL LOW (ref 36.0–46.0)
Hemoglobin: 10.3 g/dL — ABNORMAL LOW (ref 12.0–15.0)
Immature Granulocytes: 0 %
Lymphocytes Relative: 29 %
Lymphs Abs: 1.6 10*3/uL (ref 0.7–4.0)
MCH: 24.6 pg — ABNORMAL LOW (ref 26.0–34.0)
MCHC: 32.7 g/dL (ref 30.0–36.0)
MCV: 75.2 fL — ABNORMAL LOW (ref 80.0–100.0)
Monocytes Absolute: 0.5 10*3/uL (ref 0.1–1.0)
Monocytes Relative: 9 %
Neutro Abs: 3.5 10*3/uL (ref 1.7–7.7)
Neutrophils Relative %: 61 %
Platelet Count: 258 10*3/uL (ref 150–400)
RBC: 4.19 MIL/uL (ref 3.87–5.11)
RDW: 14.3 % (ref 11.5–15.5)
WBC Count: 5.7 10*3/uL (ref 4.0–10.5)
nRBC: 0 % (ref 0.0–0.2)

## 2023-10-12 LAB — MISCELLANEOUS TEST

## 2023-10-12 MED ORDER — ONDANSETRON HCL 8 MG PO TABS: 8.0000 mg | ORAL_TABLET | Freq: Three times a day (TID) | ORAL | 1 refills | Status: DC | PRN

## 2023-10-12 MED ORDER — PROCHLORPERAZINE MALEATE 10 MG PO TABS: 10.0000 mg | ORAL_TABLET | Freq: Four times a day (QID) | ORAL | 1 refills | Status: DC | PRN

## 2023-10-12 MED ORDER — LIDOCAINE-PRILOCAINE 2.5-2.5 % EX CREA: TOPICAL_CREAM | CUTANEOUS | 3 refills | Status: AC

## 2023-10-12 NOTE — Progress Notes (Signed)
 [facility] CSW Progress Note  Clinical Child psychotherapist  checked in briefly w/ pt and her fiance at doctor's visit to check if pt had received all resources that were mailed to her.  CSW submitted a referral to [employer] on behalf of pt for additional financial support.        Rachel Moulds, LCSW Clinical Social Worker  Cancer Center    Patient is participating in a Managed Medicaid Plan:  Yes

## 2023-10-12 NOTE — Progress Notes (Signed)
 START OFF PATHWAY REGIMEN - Colorectal   [id number]:mFOLFIRINOX (Fluorouracil 2,400 mg/m2/cycle; Irinotecan 150 mg/m2) q14 Days:   A cycle is every 14 days:     Irinotecan      Oxaliplatin      Leucovorin      Fluorouracil   **Always confirm dose/schedule in your pharmacy ordering system**  Patient Characteristics: Distant Metastases, Resectable, Neoadjuvant Therapy Planned Tumor Location: Colon Therapeutic Status: Distant Metastases Intent of Therapy: Curative Intent, Discussed with Patient

## 2023-10-12 NOTE — Assessment & Plan Note (Signed)
-  Stage IV with oligometastasis, MMR normal -Presented with abdominal pain, change of bowel habits, and a 50 pound weight loss. -Colonoscopy showed malignant obstructive tumor in the distal descending colon, biopsy showed moderate differentiated invasive adenocarcinoma. -CT scan showed a 2.6 cm oligo liver metastasis.  Biopsy is pending -Patient was seen by colorectal surgeon Dr. Michaell Cowing on October 10, 2023. -Would recommend neoadjuvant chemotherapy for 3 months, followed by surgery (left hemicolectomy, liver resection), or liver ablation or radiation if she respond well to chemo

## 2023-10-12 NOTE — Progress Notes (Signed)
 PATIENT NAVIGATOR PROGRESS NOTE  Name: Ashley [name] Date: 10/12/2023 MRN: [id number]  DOB: 19-Sep-1985   Reason for visit:  Med-Onc Follow-up/Treatment Discussion  Comments:    Patient in office to see Dr. Mosetta Putt to discuss plan for treatment.  Patient to start neoadjuvant chemo within next couple of weeks.  Arrangements made for patient to have chest port-a-cath inserted on same day as her liver biopsy.  Patient was made aware of this plan and was in agreement.  Patient was instructed to contact office if she had any future questions or concerns.    Time spent counseling/coordinating care: > 60 minutes

## 2023-10-12 NOTE — Progress Notes (Signed)
 [facility]   [phone] [phone]   Clinic Follow up Note   Patient Care Team: Leilani Able, MD as PCP - General (Family Medicine) Idell Pickles, Rico Ala, RN as Oncology Nurse Navigator Lynann Bologna, MD as Consulting Physician (Gastroenterology) Malachy Mood, MD as Consulting Physician (Medical Oncology) Brock Bad, MD as Consulting Physician (Obstetrics and Gynecology) Karie Soda, MD as Consulting Physician (General Surgery) Fritzi Mandes, MD as Consulting Physician (General Surgery)  Date of Service:  10/12/2023  CHIEF COMPLAINT: f/u of metastatic colon cancer  CURRENT THERAPY:  Pending neoadjuvant chemotherapy  Oncology History   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 -Colonoscopy showed malignant obstructive tumor in the distal descending colon, biopsy showed moderate differentiated invasive adenocarcinoma. -CT scan showed a 2.6 cm oligo liver metastasis.  Biopsy is pending -Patient was seen by colorectal surgeon Dr. Michaell Cowing on October 10, 2023. -Would recommend neoadjuvant chemotherapy for 3 months, followed by surgery (left hemicolectomy, liver resection), or liver ablation or radiation if she respond well to chemo    Assessment & Plan Metastatic colon cancer 38 year old female with metastatic colon cancer involving a colon mass, a 2.7 cm liver lesion, and enlarged lymph nodes. The goal of chemotherapy is palliative to shrink her tumor. Discussed various chemotherapy regimens, including the most intensive three-drug regimen FOLFOXIRI, which may shrink the tumor faster and provide more options for surgical intervention. She is willing to try the most intensive regimen, considering her flexible work schedule and desire to cure the cancer. Potential side effects include fatigue, cold sensitivity, low appetite,  nausea, diarrhea, hair thinning, risk of infection, and potential liver damage. The regimen involves an eight-hour infusion followed by a home pump for two days. The plan is to administer chemotherapy for two to three months, followed by a scan to evaluate the response and decide on surgery or ablation. If the liver lesion shrinks significantly, ablation may be an option instead of extensive surgery. Radiation therapy is also a potential option if the tumor is too deep for ablation. - Schedule port placement for chemotherapy administration. - Initiate the most intensive three-drug chemotherapy regimen. - Monitor liver and kidney function, and blood counts closely during treatment. - Repeat scan after cycle five of chemotherapy to assess treatment response. - Consider surgical options or ablation based on tumor response to chemotherapy.  Anemia Mild anemia with hemoglobin at 10.3 g/dL. Previous iron studies showed slightly low iron levels. Discussed the use of oral iron supplements or prenatal vitamins containing iron, B12, and folate to address anemia. Advised on potential side effects of iron supplements, including constipation and dark stools. - Recommend oral iron supplements or prenatal vitamins with iron, B12, and folate. - Monitor hemoglobin levels and adjust treatment as necessary.  Plan -I recommended neoadjuvant chemotherapy FOLFOXIRI with G-CSF for 3 months  -She is scheduled for chemo class tomorrow -Port placement and liver biopsy next week -Plan to start chemotherapy the week of April 20     SUMMARY OF ONCOLOGIC HISTORY: Oncology History  Adenocarcinoma of descending colon (HCC)  09/11/2023 Cancer Staging   Staging form: Colon and Rectum, AJCC 8th Edition - Clinical stage from 09/11/2023: Stage IVA (cTX, cN1, cM1a) - Signed by Malachy Mood, MD on 10/12/2023   10/01/2023 Initial Diagnosis   Adenocarcinoma of descending colon (HCC)   10/24/2023 -  Chemotherapy   Patient is on  Treatment Plan :  COLORECTAL FOLFOXIRI q14d        Discussed the use of AI scribe software for clinical note transcription with the patient, who gave verbal consent to proceed.  History of Present Illness The patient, a 38 year old female with metastatic colon cancer, presents for a follow-up visit. She recently consulted with another doctor who recommended starting chemotherapy with six sessions. The patient reports no new symptoms since the last visit. She denies any pain or discomfort and has been managing her bowel movements with Miralax, which she reports has been helpful. She denies any blood in her stool and reports good appetite with no nausea. The patient's recent labs show mild anemia with a hemoglobin of 10.3. She is not currently taking oral iron or prenatal vitamins but agrees to start.     All other systems were reviewed with the patient and are negative.  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   Irregular periods/menstrual cycles 01/30/2012   Late prenatal care 01/30/2012   Poor social situation 01/30/2012   Previous cesarean section complicating pregnancy, antepartum condition or complication 01/27/2016   2 previous cesarean section- Desires repeat with BTL   Single liveborn, born in hospital, delivered by cesarean delivery 08/13/2016   Status post repeat low transverse cesarean section 08/14/2016   Status post tubal ligation at time of delivery, current hosp 08/14/2016    SURGICAL HISTORY: Past Surgical History:  Procedure Laterality Date   CESAREAN SECTION  2009   CESAREAN SECTION  01/30/2012   Procedure: CESAREAN SECTION;  Surgeon: Michael Litter, MD;  Location: [facility];  Service: Gynecology;  Laterality: N/A;  Repeat C/S   CESAREAN SECTION WITH BILATERAL TUBAL LIGATION Bilateral 08/13/2016   Procedure: CESAREAN SECTION  WITH BILATERAL TUBAL LIGATION;  Surgeon: Lesly Dukes, MD;  Location: [facility];  Service: Obstetrics;  Laterality: Bilateral;   CHOLECYSTECTOMY N/A 10/16/2013   Procedure: LAPAROSCOPIC CHOLECYSTECTOMY WITH INTRAOPERATIVE CHOLANGIOGRAM;  Surgeon: Wilmon Arms. Corliss Skains, MD;  Location: [facility] OR;  Service: General;  Laterality: N/A;   COLONOSCOPY WITH PROPOFOL N/A 09/11/2023   Procedure: COLONOSCOPY WITH PROPOFOL;  Surgeon: Lynann Bologna, MD;  Location: [facility];  Service: Gastroenterology;  Laterality: N/A;   DILATION AND EVACUATION N/A 07/28/2015   Procedure: SUCTION, DILATATION AND EVACUATION;  Surgeon: Brock Bad, MD;  Location: [facility];  Service: Gynecology;  Laterality: N/A;   ESOPHAGOGASTRODUODENOSCOPY (EGD) WITH PROPOFOL N/A 09/11/2023   Procedure: ESOPHAGOGASTRODUODENOSCOPY (EGD) WITH PROPOFOL;  Surgeon: Lynann Bologna, MD;  Location: [facility];  Service: Gastroenterology;  Laterality: N/A;   SUBMUCOSAL INJECTION  09/11/2023   Procedure: INJECTION, SUBMUCOSAL;  Surgeon: Lynann Bologna, MD;  Location: [facility];  Service: Gastroenterology;;   TONSILLECTOMY  AGE 35   TONSILLECTOMY AND ADENOIDECTOMY  AGEB 18    I have reviewed the social history and family history with the patient and they are unchanged from previous note.  ALLERGIES:  is allergic to fruit extracts.  MEDICATIONS:  Current Outpatient Medications  Medication Sig Dispense Refill   potassium chloride SA (KLOR-CON M) 20 MEQ tablet Take 1 tablet (20 mEq total) by mouth daily. Please follow up with your primary care doctor regarding additional refills. 30 tablet 0   amLODipine (NORVASC) 10 MG tablet Take 10 mg by mouth daily. (Patient not taking: Reported on 05/12/2022)     hydrochlorothiazide (HYDRODIURIL)  25 MG tablet Take 25 mg by mouth daily. (Patient not taking: Reported on 05/12/2022)     hyoscyamine (LEVSIN SL) 0.125 MG SL tablet Place 1 tablet (0.125 mg total) under the tongue every 6 (six) hours as needed for  cramping (nausea, diarrhea). (Patient not taking: Reported on 10/04/2023) 50 tablet 1   ibuprofen (ADVIL) 800 MG tablet Take 800 mg by mouth 3 (three) times daily. (Patient not taking: Reported on 10/12/2023)     lidocaine-prilocaine (EMLA) cream Apply to affected area once 30 g 3   meloxicam (MOBIC) 15 MG tablet Take 15 mg by mouth daily. (Patient not taking: Reported on 10/04/2023)     omeprazole (PRILOSEC OTC) 20 MG tablet Take 1 tablet (20 mg total) by mouth daily. (Patient not taking: Reported on 10/12/2023) 28 tablet 1   ondansetron (ZOFRAN) 8 MG tablet Take 1 tablet (8 mg total) by mouth every 8 (eight) hours as needed for nausea or vomiting. Start on the third day after chemotherapy 30 tablet 1   ondansetron (ZOFRAN-ODT) 4 MG disintegrating tablet Take 1 tablet (4 mg total) by mouth every 8 (eight) hours as needed for nausea or vomiting. (Patient not taking: Reported on 10/12/2023) 20 tablet 0   prochlorperazine (COMPAZINE) 10 MG tablet Take 1 tablet (10 mg total) by mouth every 6 (six) hours as needed for nausea or vomiting (Nausea or vomiting). 30 tablet 1   traMADol (ULTRAM) 50 MG tablet Take 50 mg by mouth every 6 (six) hours as needed. (Patient not taking: Reported on 10/12/2023)     No current facility-administered medications for this visit.    PHYSICAL EXAMINATION: ECOG PERFORMANCE STATUS: 1 - Symptomatic but completely ambulatory  Vitals:   10/12/23 1125  BP: 124/67  Pulse: 76  Resp: 18  Temp: 98.2 F (36.8 C)  SpO2: 99%   Wt Readings from Last 3 Encounters:  10/12/23 250 lb 8 oz (113.6 kg)  10/06/23 251 lb (113.9 kg)  10/04/23 254 lb 12.8 oz (115.6 kg)     GENERAL:alert, no distress and comfortable SKIN: skin color, texture, turgor are normal, no rashes or significant lesions EYES: normal, Conjunctiva are pink and non-injected, sclera clear NECK: supple, thyroid normal size, non-tender, without nodularity LYMPH:  no palpable lymphadenopathy in the cervical, axillary   LUNGS: clear to auscultation and percussion with normal breathing effort HEART: regular rate & rhythm and no murmurs and no lower extremity edema ABDOMEN:abdomen soft, non-tender and normal bowel sounds Musculoskeletal:no cyanosis of digits and no clubbing  NEURO: alert & oriented x 3 with fluent speech, no focal motor/sensory deficits  Physical Exam    LABORATORY DATA:  I have reviewed the data as listed    Latest Ref Rng & Units 10/12/2023   10:52 AM 10/04/2023   11:31 AM 09/11/2023    7:53 AM  CBC  WBC 4.0 - 10.5 K/uL 5.7  7.1  5.3   Hemoglobin 12.0 - 15.0 g/dL 29.5  18.8  41.6   Hematocrit 36.0 - 46.0 % 31.5  33.7  36.6   Platelets 150 - 400 K/uL 258  271  249         Latest Ref Rng & Units 10/12/2023   10:52 AM 10/04/2023   11:31 AM 09/11/2023    7:53 AM  CMP  Glucose 70 - 99 mg/dL 606  87  301   BUN 6 - 20 mg/dL 9  7  5    Creatinine 0.44 - 1.00 mg/dL 6.01  0.93  2.35  Sodium 135 - 145 mmol/L 139  137  138   Potassium 3.5 - 5.1 mmol/L 4.4  3.8  3.1   Chloride 98 - 111 mmol/L 104  103  105   CO2 22 - 32 mmol/L 30  28  26    Calcium 8.9 - 10.3 mg/dL 9.2  9.2  8.8   Total Protein 6.5 - 8.1 g/dL 7.5  7.7  7.7   Total Bilirubin 0.0 - 1.2 mg/dL 0.6  0.6  0.9   Alkaline Phos 38 - 126 U/L 58  67  58   AST 15 - 41 U/L 8  8  14    ALT 0 - 44 U/L 7  7  14        RADIOGRAPHIC STUDIES: I have personally reviewed the radiological images as listed and agreed with the findings in the report. No results found.    Orders Placed This Encounter  Procedures   Consent Attestation for Oncology Treatment    The patient is informed of risks, benefits, side-effects of the prescribed oncology treatment. Potential short term and long term side effects and response rates discussed. After a long discussion, the patient made informed decision to proceed.:   Yes   Pregnancy, urine    Standing Status:   Standing    Number of Occurrences:   20    Next Expected Occurrence:   10/13/2023     Expiration Date:   10/11/2024   CBC with Differential (Cancer Center Only)    Standing Status:   Future    Expected Date:   10/24/2023    Expiration Date:   10/23/2024   CMP (Cancer Center only)    Standing Status:   Future    Expected Date:   10/24/2023    Expiration Date:   10/23/2024   CBC with Differential (Cancer Center Only)    Standing Status:   Future    Expected Date:   11/07/2023    Expiration Date:   11/06/2024   CMP (Cancer Center only)    Standing Status:   Future    Expected Date:   11/07/2023    Expiration Date:   11/06/2024   CBC with Differential (Cancer Center Only)    Standing Status:   Future    Expected Date:   11/21/2023    Expiration Date:   11/20/2024   CMP (Cancer Center only)    Standing Status:   Future    Expected Date:   11/21/2023    Expiration Date:   11/20/2024   PHYSICIAN COMMUNICATION ORDER    **For [employer] [id number],  Committee recommends starting dose of 5-fluorouracil 2,400 mg/m/cycle with optional escalation to 3,200 mg/m/cycle in patients who do not experience significant toxicities.**   ONCBCN PHYSICIAN COMMUNICATION 1    0 Number of doses of oxaliplatin received at [facility], [facility], [facility] or outside facility. signature of Provider. If patient has received greater than 5 doses of oxaliplatin, the following pre-medications should be ordered: dexamethasone, diphenhydramine, and formulary histamine H2 antagonist. If patient cannot tolerate oral histamine H2 antagonist, IV may be given.   All questions were answered. The patient knows to call the clinic with any problems, questions or concerns. No barriers to learning was detected. The total time spent in the appointment was 40 minutes.     Malachy Mood, MD 10/12/2023

## 2023-10-13 ENCOUNTER — Inpatient Hospital Stay

## 2023-10-13 ENCOUNTER — Other Ambulatory Visit: Payer: Self-pay

## 2023-10-13 ENCOUNTER — Encounter: Payer: Self-pay | Admitting: Hematology

## 2023-10-13 DIAGNOSIS — C186 Malignant neoplasm of descending colon: Secondary | ICD-10-CM

## 2023-10-13 NOTE — Progress Notes (Signed)
 Returned call to patient from voicemail left.  Introduced myself as Dance movement psychotherapist and to discuss one-time [employer] to assist with personal expenses while going through treatment. Advised what is needed to apply and how to submit. She verbalized understanding. Advised at next visit on 10/24/23 she can submit and complete grant paperwork at check-in. She will then call me to discuss grant expense sheet in detail but we went over most of them.  She has my card to do so and for any additional financial questions or concerns.

## 2023-10-16 ENCOUNTER — Inpatient Hospital Stay

## 2023-10-16 ENCOUNTER — Encounter: Payer: Self-pay | Admitting: Genetic Counselor

## 2023-10-16 ENCOUNTER — Inpatient Hospital Stay: Admitting: Genetic Counselor

## 2023-10-16 DIAGNOSIS — Z800 Family history of malignant neoplasm of digestive organs: Secondary | ICD-10-CM | POA: Diagnosis not present

## 2023-10-16 DIAGNOSIS — C186 Malignant neoplasm of descending colon: Secondary | ICD-10-CM

## 2023-10-16 LAB — GENETIC SCREENING ORDER

## 2023-10-16 NOTE — Progress Notes (Signed)
 REFERRING PROVIDER: Malachy Mood, MD   PRIMARY PROVIDER:  Leilani Able, MD  PRIMARY REASON FOR VISIT:  1. Adenocarcinoma of descending colon (HCC)   2. Family history of malignant neoplasm of gastrointestinal tract    HISTORY OF PRESENT ILLNESS:   Ashley [name], a 38 y.o. female, was seen for a [place] cancer genetics consultation at the request of Malachy Mood, MD due to a personal and family history of cancer.  Ashley [name] presents to clinic today to discuss the possibility of a hereditary predisposition to cancer, genetic testing, and to further clarify her future cancer risks, as well as potential cancer risks for family members.   In 2025, at the age of 57, Ashley [name] was diagnosed with stage IV colon cancer. MMR intact. Planning for neoadjuvant chemotherapy.   CANCER HISTORY:  Oncology History  Adenocarcinoma of descending colon (HCC)  09/11/2023 Cancer Staging   Staging form: Colon and Rectum, AJCC 8th Edition - Clinical stage from 09/11/2023: Stage IVA (cTX, cN1, cM1a) - Signed by Malachy Mood, MD on 10/12/2023   10/01/2023 Initial Diagnosis   Adenocarcinoma of descending colon (HCC)   10/24/2023 -  Chemotherapy   Patient is on Treatment Plan : COLORECTAL FOLFOXIRI q14d      RELEVANT MEDICAL HISTORY:  Menarche was at age 74-16.  First live birth at age 38.  OCP use for approximately  less than 1  years.  Ovaries intact: yes.  Hysterectomy: no.  Menopausal status: premenopausal.  HRT use: 0 years. Colonoscopy: yes;  2025 . Mammogram within the last year: no. Number of breast biopsies: 0. Up to date with pelvic exams: no. Any excessive radiation exposure in the past: no  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   Irregular periods/menstrual cycles 01/30/2012   Late prenatal care 01/30/2012   Poor social situation 01/30/2012   Previous  cesarean section complicating pregnancy, antepartum condition or complication 01/27/2016   2 previous cesarean section- Desires repeat with BTL   Single liveborn, born in hospital, delivered by cesarean delivery 08/13/2016   Status post repeat low transverse cesarean section 08/14/2016   Status post tubal ligation at time of delivery, current hosp 08/14/2016    Past Surgical History:  Procedure Laterality Date   CESAREAN SECTION  2009   CESAREAN SECTION  01/30/2012   Procedure: CESAREAN SECTION;  Surgeon: Michael Litter, MD;  Location: [facility];  Service: Gynecology;  Laterality: N/A;  Repeat C/S   CESAREAN SECTION WITH BILATERAL TUBAL LIGATION Bilateral 08/13/2016   Procedure: CESAREAN SECTION WITH BILATERAL TUBAL LIGATION;  Surgeon: Lesly Dukes, MD;  Location: [facility];  Service: Obstetrics;  Laterality: Bilateral;   CHOLECYSTECTOMY N/A 10/16/2013   Procedure: LAPAROSCOPIC CHOLECYSTECTOMY WITH INTRAOPERATIVE CHOLANGIOGRAM;  Surgeon: Wilmon Arms. Corliss Skains, MD;  Location: [facility];  Service: General;  Laterality: N/A;   COLONOSCOPY WITH PROPOFOL N/A 09/11/2023   Procedure: COLONOSCOPY WITH PROPOFOL;  Surgeon: Lynann Bologna, MD;  Location: [facility];  Service: Gastroenterology;  Laterality: N/A;   DILATION AND EVACUATION N/A 07/28/2015   Procedure: SUCTION, DILATATION AND EVACUATION;  Surgeon: Brock Bad, MD;  Location: [facility];  Service: Gynecology;  Laterality: N/A;   ESOPHAGOGASTRODUODENOSCOPY (EGD) WITH PROPOFOL N/A 09/11/2023   Procedure: ESOPHAGOGASTRODUODENOSCOPY (EGD) WITH PROPOFOL;  Surgeon: Lynann Bologna, MD;  Location: [facility];  Service: Gastroenterology;  Laterality: N/A;  SUBMUCOSAL INJECTION  09/11/2023   Procedure: INJECTION, SUBMUCOSAL;  Surgeon: Lajuan Pila, MD;  Location: [facility];  Service: Gastroenterology;;   TONSILLECTOMY  AGE 37   TONSILLECTOMY AND ADENOIDECTOMY  AGEB 18    Social History   Socioeconomic History   Marital status: Single    Spouse  name: Not on file   Number of children: 3   Years of education: 6   Highest education level: Not on file  Occupational History   Occupation: lift driver  Tobacco Use   Smoking status: Former    Current packs/day: 0.00    Types: Cigarettes    Quit date: 10/31/2009    Years since quitting: 13.9   Smokeless tobacco: Never  Vaping Use   Vaping status: Every Day  Substance and Sexual Activity   Alcohol use: No    Alcohol/week: 0.0 standard drinks of alcohol   Drug use: No   Sexual activity: Yes    Partners: Male    Birth control/protection: None, Surgical    Comment: BTL 08/2016  Other Topics Concern   Not on file  Social History Narrative   Not on file   Social Drivers of Health   Financial Resource Strain: High Risk (10/09/2023)   Overall Financial Resource Strain (CARDIA)    Difficulty of Paying Living Expenses: Hard  Food Insecurity: Not on file  Transportation Needs: Not on file  Physical Activity: Not on file  Stress: Not on file  Social Connections: Not on file     FAMILY HISTORY:  We obtained a detailed, 4-generation family history.  Significant diagnoses are listed below: Family History  Problem Relation Age of Onset   Asthma Mother    Hypertension Mother    Early death Mother        AGE 23   Colon cancer Maternal Grandmother 13       late 50s-early 60s   Liver disease Neg Hx    Esophageal cancer Neg Hx     Ashley [name] is unaware of previous family history of genetic testing for hereditary cancer risks. There is no reported [name] ancestry. Ashley [name] reports her maternal grandmother was diagnosed with colon cancer in her late 60s or early 58s, and passed away in her 65s. She does not report any additional family history of tumors or cancers.      GENETIC COUNSELING ASSESSMENT: [name] is a 38 y.o. female with a personal and family history of colon cancer which is suggestive of an inherited predisposition to cancer. We, therefore,  discussed and recommended the following at today's visit.   DISCUSSION: We discussed that, in general, most cancer is not inherited in families, but instead is sporadic or familial. Sporadic cancers occur by chance and typically happen at older ages (>50 years) as this type of cancer is caused by genetic changes acquired during an individual's lifetime. Some families have more cancers than would be expected by chance; however, the ages or types of cancer are not consistent with a known genetic mutation or known genetic mutations have been ruled out. This type of familial cancer is thought to be due to a combination of multiple genetic, environmental, hormonal, and lifestyle factors. While this combination of factors likely increases the risk of cancer, the exact source of this risk is not currently identifiable or testable.  We discussed that 5 - 10% of cancer is hereditary. We discussed that testing is beneficial for several reasons including knowing how to follow individuals after completing their treatment,  identifying treatment options, and to understand if other family members could be at risk for cancer and allow them to undergo genetic testing.   We reviewed the characteristics, features and inheritance patterns of hereditary cancer syndromes. We also discussed genetic testing, including the appropriate family members to test, the process of testing, insurance coverage and turn-around-time for results. We discussed the implications of a negative, positive, carrier and/or variant of uncertain significant result. Ms. Schaffert  was offered a common hereditary cancer panel (36+ genes) and an expanded pan-cancer panel (70+ genes). Ms. Manka was informed of the benefits and limitations of each panel, including that expanded pan-cancer panels contain genes that do not have clear management guidelines at this point in time.  We also discussed that as the number of genes included on a panel increases, the  chances of variants of uncertain significance increases. Ms. Sawaya decided to pursue genetic testing for the 39 gene panel. The [facility] CancerNext+RNAinsight Panel includes sequencing, rearrangement analysis, and RNA analysis for the following 39 genes: APC, ATM, BAP1, BARD1, BMPR1A, BRCA1, BRCA2, BRIP1, CDH1, CDKN2A, CHEK2, FH, FLCN, MET, MLH1, MSH2, MSH6, MUTYH, NF1, NTHL1, PALB2, PMS2, PTEN, RAD51C, RAD51D, SMAD4, STK11, TP53, TSC1, TSC2, and VHL (sequencing and deletion/duplication); AXIN2, HOXB13, MBD4, MSH3, POLD1 and POLE (sequencing only); EPCAM and GREM1 (deletion/duplication only).  Based on Ms. Corliss's personal and family history of cancer, she meets medical criteria for genetic testing. Despite that she meets criteria, she may still have an out of pocket cost. We discussed that if her out of pocket cost for testing is over $100, the laboratory will call and confirm whether she wants to proceed with testing.  If the out of pocket cost of testing is less than $100 she will be billed by the genetic testing laboratory.   We discussed that some people do not want to undergo genetic testing due to fear of genetic discrimination.  The Genetic Information Nondiscrimination Act (GINA) was signed into federal law in 2008. GINA prohibits health insurers and most employers from discriminating against individuals based on genetic information (including the results of genetic tests and family history information). According to GINA, health insurance companies cannot consider genetic information to be a preexisting condition, nor can they use it to make decisions regarding coverage or rates. GINA also makes it illegal for most employers to use genetic information in making decisions about hiring, firing, promotion, or terms of employment. It is important to note that GINA does not offer protections for life insurance, disability insurance, or long-term care insurance. GINA does not apply to those in the  [employer], those who work for companies with less than 15 employees, and new life insurance or long-term disability insurance policies.  Health status due to a cancer diagnosis is not protected under GINA. More information about GINA can be found by visiting [email or web address].  PLAN: After considering the risks, benefits, and limitations, Ms. Endo provided informed consent to pursue genetic testing and the blood sample was sent to [facility] for analysis of the CancerNext +RNAinsight test. Results should be available within approximately 2-3 weeks' time, at which point they will be disclosed by telephone to Ms. Hollenbach, as will any additional recommendations warranted by these results. Ms. Ovitt will receive a summary of her genetic counseling visit and a copy of her results once available. This information will also be available in Epic.   Lastly, we encouraged Ms. Dinges to remain in contact with cancer genetics annually so that we can continuously update  the family history and inform her of any changes in cancer genetics and testing that may be of benefit for this family.   Ms. Thobe questions were answered to her satisfaction today. Our contact information was provided should additional questions or concerns arise. Thank you for the referral and allowing us  to share in the care of your patient.   Jobie Mulders, MS, [facility] Licensed, Retail [email or web address] phone: [phone]  40 minutes were spent on the date of the encounter in service to the patient including preparation, face-to-face consultation, documentation and care coordination. The patient was seen alone. Drs. Johnna Nakai, and/or Gudena were available for questions, if needed..    _______________________________________________________________________ For Office Staff:  Number of people involved in session: 1 Was an Intern/ student involved with case: no

## 2023-10-17 ENCOUNTER — Other Ambulatory Visit: Payer: Self-pay | Admitting: Gastroenterology

## 2023-10-17 ENCOUNTER — Inpatient Hospital Stay: Admitting: Dietician

## 2023-10-17 LAB — MOLECULAR PATHOLOGY

## 2023-10-17 NOTE — Progress Notes (Signed)
 Pharmacist Chemotherapy Monitoring - Initial Assessment    Anticipated start date: 10/24/23   The following has been reviewed per standard work regarding the patient's treatment regimen: The patient's diagnosis, treatment plan and drug doses, and organ/hematologic function Lab orders and baseline tests specific to treatment regimen  The treatment plan start date, drug sequencing, and pre-medications Prior authorization status  Patient's documented medication list, including drug-drug interaction screen and prescriptions for anti-emetics and supportive care specific to the treatment regimen The drug concentrations, fluid compatibility, administration routes, and timing of the medications to be used The patient's access for treatment and lifetime cumulative dose history, if applicable  The patient's medication allergies and previous infusion related reactions, if applicable   Changes made to treatment plan:  N/A  Follow up needed:  Port placement   Kara Orts, PharmD, MBA

## 2023-10-17 NOTE — Progress Notes (Signed)
 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 She is planning neoadjuvant chemotherapy with Folfirinox q14d (first 4/22). Patient is under the care of Dr. Maryalice Smaller.   Past medical history includes GERD, s/p cholecystectomy (2015), raw fruit allergy   Met with patient in office. She is in good spirits. Reports having good support at home with her 3 sons and fiance. Patient reports unintentional weight loss (40-50 lbs ~6 months). Denies changes in appetite or intake. She is eating 2 meals/day as usual. Reports occasional snacking. Patient does not eat breakfast. Had nachos with steak from [employer] for lunch and 5 chicken nuggets for dinner. Patient snacks on biscoff with peanut butter, chips, applesauce, popcorn, fruit cups. Patient reports limited intake of fresh fruits due to allergy. It makes her gums itch. She tolerates fruits better if they are blended in smoothie. Patient drinks 6-8 bottles of water. Sometimes she will have juice. She denies nausea, vomiting. Having daily regular bowel movements. She is taking miralax 2x/day as instructed per Dr. Hershell Lose.    Nutrition Focused Physical Exam: deferred   Medications: amlodipine, hydrodiuril, hyoscyamine, mobic, prilosec, zofran, zofran-odt, klor-con, compazine, tramadol   Labs: 4/10 - reviewed   Anthropometrics:   Height: 5'3" Weight: 250 lb 8 oz UBW: 317 lb 3.2 oz (10/25/22) BMI: 44.37    NUTRITION DIAGNOSIS: Unintended wt loss related to newly diagnosed colon cancer as evidenced by 21% (67 lb) wt loss in the last 12 months - this is severe   INTERVENTION:  Educated on importance of adequate calorie and protein energy intake to maintain strength/weights during treatment Recommend 4-6 small meals vs 2 larger meals - handout with snack ideas provided Educated on foods with protein, recommend protein source at  every meal - handout with ideas provided  Limit consumption of highly processed meats (hot dogs, pepperoni, salami) Educated on well cooked vegetables and limiting intake of gassy foods as this could be contributing to abdominal pain Suggested trying ONS for added calories and protein - Ensure Complete samples + coupons provided  Discussed cold sensitivity side effects of oxaliplatin - handout provided  Continue miralax 2x/day per MD Contact information provided   MONITORING, EVALUATION, GOAL: Pt will tolerate increased calories and protein to minimize loss of LBM    Next Visit: Tuesday May 6 during infusion

## 2023-10-17 NOTE — H&P (Signed)
 Chief Complaint: Patient was seen in consultation today for metastatic colon cancer.  Referring Physician(s): Boscia,Heather E  Supervising Physician: Elene Griffes  Patient Status: [facility] - Out-pt  History of Present Illness: Ashley [name] is a 38 y.o. female with a past medical history significant for obesity, OSA on BiPAP, GERD, HTN, HLD and stage IV metastatic colon cancer who presents today for a liver lesion biopsy and port placement. Ashley [name] was seen by GI in March of this year for constipation, abdominal pain, bloody stools, nausea, vomiting and unintentional weight loss. She underwent EGD/colonoscopy on 09/11/23 which showed a likely malignant partially obstructing tumor in the distal descending sigmoid colon which was biopsied as well as internal hemorrhoids. Pathology of the biopsy showed invasive moderate differentiated adenocarcinoma with mucinous features. She underwent CT chest/abd/pelvis w/contrast for staging on 10/02/23 which showed:  1. Descending colon carcinoma with adjacent pericolonic adenopathy and a right hepatic lobe metastasis. Small indeterminate low-attenuation lesion in the inferior right hepatic lobe may represent a second metastasis. 2. Hepatomegaly. 3. Enlarged, heterogeneous and nodular thyroid . Previous thyroid  ultrasound 07/28/2023. No follow-up recommended at that time. 4. Enlarged pulmonic trunk, indicative of pulmonary arterial hypertension.  She was seen by colorectal surgery (Dr. Hershell Lose) on 4/8 and oncology on 4/10 where it was recommended that she proceed with liver lesion biopsy to confirm metastatic disease as well as initiation of chemotherapy. She presents to IR today for port placement and liver lesion biopsy as recommended.  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   Irregular periods/menstrual  cycles 01/30/2012   Late prenatal care 01/30/2012   Poor social situation 01/30/2012   Previous cesarean section complicating pregnancy, antepartum condition or complication 01/27/2016   2 previous cesarean section- Desires repeat with BTL   Single liveborn, born in hospital, delivered by cesarean delivery 08/13/2016   Status post repeat low transverse cesarean section 08/14/2016   Status post tubal ligation at time of delivery, current hosp 08/14/2016    Past Surgical History:  Procedure Laterality Date   CESAREAN SECTION  2009   CESAREAN SECTION  01/30/2012   Procedure: CESAREAN SECTION;  Surgeon: Norville Beery, MD;  Location: [facility];  Service: Gynecology;  Laterality: N/A;  Repeat C/S   CESAREAN SECTION WITH BILATERAL TUBAL LIGATION Bilateral 08/13/2016   Procedure: CESAREAN SECTION WITH BILATERAL TUBAL LIGATION;  Surgeon: Rik Chasten, MD;  Location: [facility];  Service: Obstetrics;  Laterality: Bilateral;   CHOLECYSTECTOMY N/A 10/16/2013   Procedure: LAPAROSCOPIC CHOLECYSTECTOMY WITH INTRAOPERATIVE CHOLANGIOGRAM;  Surgeon: Kari Otto. Eli Grizzle, MD;  Location: [facility] OR;  Service: General;  Laterality: N/A;   COLONOSCOPY WITH PROPOFOL  N/A 09/11/2023   Procedure: COLONOSCOPY WITH PROPOFOL ;  Surgeon: Lajuan Pila, MD;  Location: [facility];  Service: Gastroenterology;  Laterality: N/A;   DILATION AND EVACUATION N/A 07/28/2015   Procedure: SUCTION, DILATATION AND EVACUATION;  Surgeon: Gabrielle Joiner, MD;  Location: [facility];  Service: Gynecology;  Laterality: N/A;   ESOPHAGOGASTRODUODENOSCOPY (EGD) WITH PROPOFOL  N/A 09/11/2023   Procedure: ESOPHAGOGASTRODUODENOSCOPY (EGD) WITH PROPOFOL ;  Surgeon: Lajuan Pila, MD;  Location: [facility];  Service: Gastroenterology;  Laterality: N/A;   SUBMUCOSAL INJECTION  09/11/2023   Procedure: INJECTION, SUBMUCOSAL;  Surgeon: Lajuan Pila, MD;  Location: [facility];  Service: Gastroenterology;;   TONSILLECTOMY  AGE 44   TONSILLECTOMY AND ADENOIDECTOMY  AGEB 18    Allergies: Fruit extracts  Medications: Prior to Admission medications   Medication Sig Start Date End Date Taking? Authorizing Provider  amLODipine (NORVASC) 10 MG tablet Take 10 mg by mouth daily. Patient not taking: Reported on 05/12/2022    [provider]  hydrochlorothiazide (HYDRODIURIL) 25 MG tablet Take 25 mg by mouth daily. Patient not taking: Reported on 05/12/2022    [provider]  hyoscyamine  (LEVSIN SL) 0.125 MG SL tablet Place 1 tablet (0.125 mg total) under the tongue every 6 (six) hours as needed for cramping (nausea, diarrhea). Patient not taking: Reported on 10/04/2023 07/18/23   Edmonia Gottron, PA-C  ibuprofen  (ADVIL ) 800 MG tablet Take 800 mg by mouth 3 (three) times daily. Patient not taking: Reported on 10/12/2023 02/28/23   [provider]  lidocaine -prilocaine  (EMLA ) cream Apply to affected area once 10/12/23   Sonja Anthony, MD  meloxicam (MOBIC) 15 MG tablet Take 15 mg by mouth daily. Patient not taking: Reported on 10/04/2023 05/11/23   [provider]  omeprazole  (PRILOSEC  OTC) 20 MG tablet Take 1 tablet (20 mg total) by mouth daily. Patient not taking: Reported on 10/12/2023 09/11/23 09/10/24  Lajuan Pila, MD  ondansetron  (ZOFRAN ) 8 MG tablet Take 1 tablet (8 mg total) by mouth every 8 (eight) hours as needed for nausea or vomiting. Start on the third day after chemotherapy 10/12/23   Sonja Olivia, MD  ondansetron  (ZOFRAN -ODT) 4 MG disintegrating tablet Take 1 tablet (4 mg total) by mouth every 8 (eight) hours as needed for nausea or vomiting. Patient not taking: Reported on 10/12/2023 04/05/23   Zelaya, Oscar A, PA-C  potassium chloride  SA (KLOR-CON  M) 20 MEQ tablet Take 1 tablet (20 mEq total) by mouth daily. Please follow up with your primary care doctor regarding additional refills. 09/12/23   Lajuan Pila, MD  prochlorperazine  (COMPAZINE ) 10 MG tablet Take 1 tablet (10 mg total) by mouth every 6 (six) hours as needed for  nausea or vomiting (Nausea or vomiting). 10/12/23   Sonja Downey, MD  traMADol (ULTRAM) 50 MG tablet Take 50 mg by mouth every 6 (six) hours as needed. Patient not taking: Reported on 10/12/2023 05/10/23   [provider]     Family History  Problem Relation Age of Onset   Asthma Mother    Hypertension Mother    Early death Mother        AGE 36   Colon cancer Maternal Grandmother 32       late 50s-early 60s   Liver disease Neg Hx    Esophageal cancer Neg Hx     Social History   Socioeconomic History   Marital status: Single    Spouse name: Not on file   Number of children: 3   Years of education: 12   Highest education level: Not on file  Occupational History   Occupation: lift driver  Tobacco Use   Smoking status: Former    Current packs/day: 0.00    Types: Cigarettes    Quit date: 10/31/2009    Years since quitting: 13.9   Smokeless tobacco: Never  Vaping Use   Vaping status: Every Day  Substance and Sexual Activity   Alcohol use: No    Alcohol/week: 0.0 standard drinks of alcohol   Drug use: No   Sexual activity: Yes    Partners: Male    Birth control/protection: None, Surgical    Comment: BTL 08/2016  Other Topics Concern   Not on file  Social  History Narrative   Not on file   Social Drivers of Health   Financial Resource Strain: High Risk (10/09/2023)   Overall Financial Resource Strain (CARDIA)    Difficulty of Paying Living Expenses: Hard  Food Insecurity: Not on file  Transportation Needs: Not on file  Physical Activity: Not on file  Stress: Not on file  Social Connections: Not on file     Review of Systems: A 12 point ROS discussed and pertinent positives are indicated in the HPI above.  All other systems are negative.  Review of Systems  Constitutional:  Negative for chills and fever.  Respiratory:  Negative for cough and shortness of breath.   Cardiovascular:  Negative for chest pain.  Gastrointestinal:  Negative for abdominal pain,  diarrhea, nausea and vomiting.  Genitourinary:  Negative for hematuria.  Musculoskeletal:  Negative for back pain.  Neurological:  Negative for dizziness and headaches.    Vital Signs: BP 135/77 (BP Location: Right Arm)   Pulse 85   Temp 98.4 F (36.9 C) (Oral)   Resp 18   Ht 5\' 3"  (1.6 m)   Wt 242 lb 11.6 oz (110.1 kg)   SpO2 99%   BMI 43.00 kg/m   Physical Exam Vitals reviewed.  Constitutional:      General: She is not in acute distress. HENT:     Head: Normocephalic.     Mouth/Throat:     Mouth: Mucous membranes are moist.     Pharynx: Oropharynx is clear. No oropharyngeal exudate or posterior oropharyngeal erythema.  Eyes:     General: No scleral icterus. Cardiovascular:     Rate and Rhythm: Normal rate and regular rhythm.  Pulmonary:     Effort: Pulmonary effort is normal.     Breath sounds: Normal breath sounds.  Abdominal:     General: There is no distension.     Palpations: Abdomen is soft.     Tenderness: There is no abdominal tenderness.  Skin:    General: Skin is warm and dry.     Coloration: Skin is not jaundiced.  Neurological:     Mental Status: She is alert and oriented to person, place, and time.  Psychiatric:        Mood and Affect: Mood normal.        Behavior: Behavior normal.        Thought Content: Thought content normal.        Judgment: Judgment normal.      MD Evaluation Airway: WNL Heart: WNL Abdomen: WNL Chest/ Lungs: WNL ASA  Classification: 3 Mallampati/Airway Score: One   Imaging: MR LIVER W WO CONTRAST Result Date: 10/07/2023 CLINICAL DATA:  Evaluate liver metastasis.  History of colon cancer. EXAM: MRI ABDOMEN WITHOUT AND WITH CONTRAST TECHNIQUE: Multiplanar multisequence MR imaging of the abdomen was performed both before and after the administration of intravenous contrast. CONTRAST:  10mL GADAVIST  GADOBUTROL  1 MMOL/ML IV SOLN COMPARISON:  CT 09/29/2023 FINDINGS: Lower chest: Heart size appears enlarged.  No acute  findings. Hepatobiliary: Within the central right lobe of liver there is a T2 hyperintense, rim enhancing lesion measuring 2.7 x 2.4 cm, image 34/12. No additional enhancing liver lesions identified. Specifically, no MRI correlate to the vague 10 mm low-density structure in the inferior right lobe of liver. Status post cholecystectomy. No bile duct dilatation. Pancreas: No mass, inflammatory changes, or other parenchymal abnormality identified. Spleen:  Within normal limits in size and appearance. Adrenals/Urinary Tract: Normal adrenal glands. Small bilateral T2 hyperintense kidney lesions  are all measuring less than 1 cm. The largest is in the posterior upper pole of right kidney measuring 8 mm, image 48/16. These are compatible with Bosniak class 1 cyst and require no further follow-up. No signs of hydronephrosis. Stomach/Bowel: Stomach is within normal limits. There is no abnormal dilatation of the bowel loops. Descending colon mass is again noted measuring 4.8 x 4.6 by 6.3 cm, image 70/14 and image 25/8. Transmural extension of tumor with finger-like projection identified laterally and medially. Along the medial aspect of the descending colon there is an enlarged lymph node measuring 1.6 cm. Vascular/Lymphatic: Patent abdominal vascularity. No aneurysm. Porta hepatic lymph node measures 0.9 cm, image 39/14. No retroperitoneal adenopathy identified. Other:  No ascites.  No discrete fluid collections identified. Musculoskeletal: No suspicious bone lesions identified. IMPRESSION: 1. There is a 2.7 x 2.4 cm rim enhancing lesion within the central right lobe of liver compatible with metastatic disease. 2. No additional enhancing liver lesions identified. Specifically, no MRI correlate to the vague 10 mm low-density structure in the inferior right lobe of liver. 3. Descending colon mass is again noted measuring 4.8 x 4.6 x 6.3 cm. Transmural extension of tumor with finger-like projection identified laterally and  medially. 4. Along the medial aspect of the descending colon there is an enlarged lymph node measuring 1.6 cm. This is compatible with metastatic adenopathy. Electronically Signed   By: Kimberley Penman M.D.   On: 10/07/2023 15:17   CT CHEST ABDOMEN PELVIS W CONTRAST Result Date: 10/02/2023 CLINICAL DATA:  Colon cancer.  * Tracking Code: BO * EXAM: CT CHEST, ABDOMEN, AND PELVIS WITH CONTRAST TECHNIQUE: Multidetector CT imaging of the chest, abdomen and pelvis was performed following the standard protocol during bolus administration of intravenous contrast. RADIATION DOSE REDUCTION: This exam was performed according to the departmental dose-optimization program which includes automated exposure control, adjustment of the mA and/or kV according to patient size and/or use of iterative reconstruction technique. CONTRAST:  OMNIPAQUE  IOHEXOL  300 MG/ML  SOLN COMPARISON:  CT abdomen pelvis 04/05/2023. FINDINGS: CT CHEST FINDINGS Cardiovascular: Heart is enlarged and left ventricle appears dilated. No pericardial effusion. Enlarged pulmonic trunk. Mediastinum/Nodes: Enlarged heterogeneous and nodular thyroid . No pathologically enlarged mediastinal, hilar or axillary lymph nodes. Esophagus is grossly unremarkable. Lungs/Pleura: Image quality is degraded by respiratory motion. Calcified granulomas. No pleural fluid. Airway is unremarkable. Musculoskeletal: Degenerative changes in the spine. No worrisome lytic or sclerotic lesions. CT ABDOMEN PELVIS FINDINGS Hepatobiliary: Mildly hypodense and somewhat ill-defined nodule in the right hepatic lobe measures 2.4 x 2.6 cm. Vague 10 mm low-attenuation lesion in the inferior right hepatic lobe (2/67). Liver is enlarged, 23.2 cm. Cholecystectomy. No biliary ductal dilatation. Pancreas: Negative. Spleen: Negative. Adrenals/Urinary Tract: Adrenal glands are unremarkable. Subcentimeter low-attenuation lesions in the kidneys, too small to characterize. No specific follow-up  necessary. Stomach/Bowel: Stomach, small bowel, appendix and majority of the colon are unremarkable. Thick-walled mass in the mid to distal descending colon (2/76) measures approximately 4.3 x 4.4 cm. Adjacent adenopathy measures up to 1.9 cm (2/76). Vascular/Lymphatic: Vascular structures are unremarkable. No additional pathologically enlarged lymph nodes. Reproductive: Uterus is visualized. 5.0 cm simple appearing right adnexal cyst. No follow-up imaging recommended. Note: This recommendation does not apply to premenarchal patients and to those with increased risk (genetic, family history, elevated tumor markers or other high-risk factors) of ovarian cancer. Reference: JACR 2020 Feb; 17(2):248-254. Other: No free fluid. Mesenteries and peritoneum are unremarkable. Small umbilical hernia contains fat. Musculoskeletal: Degenerative changes in the spine. No worrisome  lytic or sclerotic lesions. IMPRESSION: 1. Descending colon carcinoma with adjacent pericolonic adenopathy and a right hepatic lobe metastasis. Small indeterminate low-attenuation lesion in the inferior right hepatic lobe may represent a second metastasis. 2. Hepatomegaly. 3. Enlarged, heterogeneous and nodular thyroid . Previous thyroid  ultrasound 07/28/2023. No follow-up recommended at that time. 4. Enlarged pulmonic trunk, indicative of pulmonary arterial hypertension. Electronically Signed   By: Shearon Denis M.D.   On: 10/02/2023 15:03    Labs:  CBC: Recent Labs    09/11/23 0753 10/04/23 1131 10/12/23 1052 10/20/23 1027  WBC 5.3 7.1 5.7 5.8  HGB 11.5* 10.8* 10.3* 10.5*  HCT 36.6 33.7* 31.5* 32.4*  PLT 249 271 258 280    COAGS: Recent Labs    10/20/23 1027  INR 1.1    BMP: Recent Labs    04/04/23 1653 07/18/23 0936 09/11/23 0753 10/04/23 1131 10/12/23 1052  NA 136 139 138 137 139  K 3.3* 3.8 3.1* 3.8 4.4  CL 98 103 105 103 104  CO2 27 29 26 28 30   GLUCOSE 93 80 100* 87 101*  BUN 5* 8 5* 7 9  CALCIUM  8.7* 8.6  8.8* 9.2 9.2  CREATININE 0.65 0.58 0.63 0.58 0.63  GFRNONAA >60  --  >60 >60 >60    LIVER FUNCTION TESTS: Recent Labs    07/18/23 0936 09/11/23 0753 10/04/23 1131 10/12/23 1052  BILITOT 0.4 0.9 0.6 0.6  AST 7 14* 8* 8*  ALT 7 14 7 7   ALKPHOS 66 58 67 58  PROT 7.2 7.7 7.7 7.5  ALBUMIN 3.8 3.7 3.9 3.9    TUMOR MARKERS: No results for input(s): "AFPTM", "CEA", "CA199", "CHROMGRNA" in the last 8760 hours.  Assessment and Plan:  38 y/o F with recently diagnosed stage IV metastatic colon cancer with 2.7 cm liver lesion of uncertain etiology who presents today for liver lesion biopsy to confirm metastatic disease as well as port placement prior to beginning chemotherapy.  Risks and benefits of liver lesion biopsy was discussed with the patient and/or patient's family including, but not limited to bleeding, infection, damage to adjacent structures or low yield requiring additional tests.  Risks and benefits of image-guided Port-a-catheter placement were discussed with the patient including, but not limited to bleeding, infection, pneumothorax, or fibrin sheath development and need for additional procedures.  All of the patient's questions were answered, patient is agreeable to proceed.  Consent signed and in chart.  Thank you for this interesting consult.  I greatly enjoyed meeting Ashley [name] and look forward to participating in their care.  A copy of this report was sent to the requesting provider on this date.  Electronically Signed: Marilu Shown, PA-C 10/20/2023, 11:06 AM   I spent a total of 40 Minutes  in face to face in clinical consultation, greater than 50% of which was counseling/coordinating care for metastatic colon cancer.

## 2023-10-17 NOTE — Telephone Encounter (Signed)
 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

## 2023-10-18 ENCOUNTER — Other Ambulatory Visit: Payer: Self-pay

## 2023-10-19 ENCOUNTER — Other Ambulatory Visit: Payer: Self-pay | Admitting: Radiology

## 2023-10-19 DIAGNOSIS — R160 Hepatomegaly, not elsewhere classified: Secondary | ICD-10-CM

## 2023-10-19 NOTE — Progress Notes (Signed)
 Patient for IR US  Liver Bx & IR Port Insertion on Friday 10/20/2023, I called and spoke with the patient on the phone and gave pre-procedure instructions. Pt was made aware to be here at 10a, NPO after MN prior to procedure as well as driver post procedure/recovery/discharge. Pt stated understanding.  Called 10/19/23

## 2023-10-20 ENCOUNTER — Ambulatory Visit
Admission: RE | Admit: 2023-10-20 | Discharge: 2023-10-20 | Disposition: A | Discharge status: Home / Self Care | Source: Ambulatory Visit | Attending: Hematology | Admitting: Hematology

## 2023-10-20 ENCOUNTER — Encounter: Payer: Self-pay | Admitting: Radiology

## 2023-10-20 ENCOUNTER — Other Ambulatory Visit: Payer: Self-pay

## 2023-10-20 ENCOUNTER — Ambulatory Visit
Admission: RE | Admit: 2023-10-20 | Discharge: 2023-10-20 | Disposition: A | Discharge status: Home / Self Care | Source: Ambulatory Visit | Attending: Hematology | Admitting: Radiology

## 2023-10-20 ENCOUNTER — Ambulatory Visit
Admission: RE | Admit: 2023-10-20 | Discharge: 2023-10-20 | Disposition: A | Discharge status: Home / Self Care | Source: Ambulatory Visit | Attending: Nurse Practitioner | Admitting: Nurse Practitioner

## 2023-10-20 ENCOUNTER — Other Ambulatory Visit: Payer: Self-pay | Admitting: Hematology

## 2023-10-20 DIAGNOSIS — C787 Secondary malignant neoplasm of liver and intrahepatic bile duct: Secondary | ICD-10-CM | POA: Diagnosis not present

## 2023-10-20 DIAGNOSIS — C186 Malignant neoplasm of descending colon: Secondary | ICD-10-CM | POA: Insufficient documentation

## 2023-10-20 DIAGNOSIS — Z800 Family history of malignant neoplasm of digestive organs: Secondary | ICD-10-CM | POA: Insufficient documentation

## 2023-10-20 DIAGNOSIS — R160 Hepatomegaly, not elsewhere classified: Secondary | ICD-10-CM

## 2023-10-20 DIAGNOSIS — C801 Malignant (primary) neoplasm, unspecified: Secondary | ICD-10-CM | POA: Diagnosis not present

## 2023-10-20 DIAGNOSIS — F17290 Nicotine dependence, other tobacco product, uncomplicated: Secondary | ICD-10-CM | POA: Insufficient documentation

## 2023-10-20 DIAGNOSIS — Z5986 Financial insecurity: Secondary | ICD-10-CM | POA: Diagnosis not present

## 2023-10-20 DIAGNOSIS — C189 Malignant neoplasm of colon, unspecified: Secondary | ICD-10-CM | POA: Diagnosis not present

## 2023-10-20 DIAGNOSIS — K769 Liver disease, unspecified: Secondary | ICD-10-CM | POA: Diagnosis not present

## 2023-10-20 HISTORY — PX: IR US LIVER BIOPSY: IMG936

## 2023-10-20 HISTORY — PX: IR IMAGING GUIDED PORT INSERTION: IMG5740

## 2023-10-20 LAB — PROTIME-INR
INR: 1.1 (ref 0.8–1.2)
Prothrombin Time: 14.2 s (ref 11.4–15.2)

## 2023-10-20 LAB — CBC
HCT: 32.4 % — ABNORMAL LOW (ref 36.0–46.0)
Hemoglobin: 10.5 g/dL — ABNORMAL LOW (ref 12.0–15.0)
MCH: 24.9 pg — ABNORMAL LOW (ref 26.0–34.0)
MCHC: 32.4 g/dL (ref 30.0–36.0)
MCV: 76.8 fL — ABNORMAL LOW (ref 80.0–100.0)
Platelets: 280 10*3/uL (ref 150–400)
RBC: 4.22 MIL/uL (ref 3.87–5.11)
RDW: 14.1 % (ref 11.5–15.5)
WBC: 5.8 10*3/uL (ref 4.0–10.5)
nRBC: 0 % (ref 0.0–0.2)

## 2023-10-20 MED ORDER — LIDOCAINE-EPINEPHRINE 1 %-1:100000 IJ SOLN
20.0000 mL | Freq: Once | INTRAMUSCULAR | Status: AC
  Administered 2023-10-20: 20 mL via INTRADERMAL

## 2023-10-20 MED ORDER — MIDAZOLAM HCL 2 MG/2ML IJ SOLN
INTRAMUSCULAR | Status: AC | PRN
  Administered 2023-10-20 (×2): .5 mg via INTRAVENOUS
  Administered 2023-10-20: 1 mg via INTRAVENOUS
  Administered 2023-10-20: .5 mg via INTRAVENOUS

## 2023-10-20 MED ORDER — FENTANYL CITRATE (PF) 100 MCG/2ML IJ SOLN
INTRAMUSCULAR | Status: AC | PRN
  Administered 2023-10-20 (×2): 25 ug via INTRAVENOUS
  Administered 2023-10-20 (×2): 50 ug via INTRAVENOUS
  Administered 2023-10-20: 25 ug via INTRAVENOUS

## 2023-10-20 MED ORDER — FENTANYL CITRATE (PF) 100 MCG/2ML IJ SOLN
INTRAMUSCULAR | Status: AC | PRN
  Administered 2023-10-20: 25 ug via INTRAVENOUS

## 2023-10-20 MED ORDER — LIDOCAINE HCL 1 % IJ SOLN
20.0000 mL | Freq: Once | INTRAMUSCULAR | Status: AC
  Administered 2023-10-20: 10 mL via INTRADERMAL

## 2023-10-20 MED ORDER — LIDOCAINE-EPINEPHRINE 1 %-1:100000 IJ SOLN
INTRAMUSCULAR | Status: AC
  Filled 2023-10-20: qty 1

## 2023-10-20 MED ORDER — FENTANYL CITRATE (PF) 100 MCG/2ML IJ SOLN
INTRAMUSCULAR | Status: AC | PRN
  Administered 2023-10-20 (×4): 25 ug via INTRAVENOUS

## 2023-10-20 MED ORDER — MIDAZOLAM HCL 2 MG/2ML IJ SOLN
INTRAMUSCULAR | Status: AC | PRN
  Administered 2023-10-20: .5 mg via INTRAVENOUS
  Administered 2023-10-20 (×2): 1 mg via INTRAVENOUS
  Administered 2023-10-20: .5 mg via INTRAVENOUS
  Administered 2023-10-20: 1 mg via INTRAVENOUS

## 2023-10-20 MED ORDER — LIDOCAINE 1 % OPTIME INJ - NO CHARGE
10.0000 mL | Freq: Once | INTRAMUSCULAR | Status: AC
  Administered 2023-10-20: 10 mL via INTRADERMAL
  Filled 2023-10-20: qty 10

## 2023-10-20 MED ORDER — MIDAZOLAM HCL 2 MG/2ML IJ SOLN
INTRAMUSCULAR | Status: AC
  Filled 2023-10-20: qty 2

## 2023-10-20 MED ORDER — HYDROCODONE-ACETAMINOPHEN 5-325 MG PO TABS
1.0000 | ORAL_TABLET | ORAL | Status: DC | PRN
  Administered 2023-10-20: 1 via ORAL

## 2023-10-20 MED ORDER — HEPARIN SOD (PORK) LOCK FLUSH 100 UNIT/ML IV SOLN
INTRAVENOUS | Status: AC
  Filled 2023-10-20: qty 5

## 2023-10-20 MED ORDER — FENTANYL CITRATE (PF) 100 MCG/2ML IJ SOLN
INTRAMUSCULAR | Status: AC
  Filled 2023-10-20: qty 2

## 2023-10-20 MED ORDER — HYDROCODONE-ACETAMINOPHEN 5-325 MG PO TABS
ORAL_TABLET | ORAL | Status: DC
Start: 2023-10-20 — End: 2023-10-21
  Filled 2023-10-20: qty 1

## 2023-10-20 MED ORDER — SODIUM CHLORIDE 0.9 % IV SOLN
INTRAVENOUS | Status: DC
Start: 2023-10-20 — End: 2023-10-21

## 2023-10-20 MED ORDER — LIDOCAINE HCL 1 % IJ SOLN
INTRAMUSCULAR | Status: AC
  Filled 2023-10-20: qty 20

## 2023-10-20 MED ORDER — LIDOCAINE HCL 1 % IJ SOLN
10.0000 mL | Freq: Once | INTRAMUSCULAR | Status: AC
  Administered 2023-10-20: 10 mL via INTRADERMAL

## 2023-10-20 MED ORDER — HEPARIN SOD (PORK) LOCK FLUSH 100 UNIT/ML IV SOLN
500.0000 [IU] | Freq: Once | INTRAVENOUS | Status: AC
  Administered 2023-10-20: 500 [IU] via INTRAVENOUS

## 2023-10-20 MED ORDER — GELATIN ABSORBABLE 12-7 MM EX MISC
1.0000 | Freq: Once | CUTANEOUS | Status: AC
  Administered 2023-10-20: 1 via TOPICAL
  Filled 2023-10-20: qty 1

## 2023-10-20 NOTE — Procedures (Signed)
 Interventional Radiology Procedure:   Indications: Colon cancer with liver lesion  Procedure: Port placement and ultrasound-guided liver lesion biopsy  Findings: Right jugular port, tip at SVC/RA junction. 2 core biopsies from central right hepatic lesion.   Complications: None     EBL: Minimal, less than 10 ml  Plan: Discharge in three hours.  Keep port site and incisions dry for at least 24 hours.     Morley Gaumer R. Julietta Ogren, MD  Pager: [phone]

## 2023-10-23 ENCOUNTER — Encounter: Payer: Self-pay | Admitting: Hematology

## 2023-10-23 NOTE — Progress Notes (Signed)
 Patient Care Team: Danella Dunn, MD as PCP - General (Family Medicine) Gerald Kitty, Isiah Mare, RN as Oncology Nurse Navigator Lajuan Pila, MD as Consulting Physician (Gastroenterology) Sonja Charlotte, MD as Consulting Physician (Medical Oncology) Gabrielle Joiner, MD as Consulting Physician (Obstetrics and Gynecology) Candyce Champagne, MD as Consulting Physician (General Surgery) Lujean Sake, MD as Consulting Physician (General Surgery)   CHIEF COMPLAINT: Follow up colon cancer   Oncology History  Adenocarcinoma of descending colon [facility])  09/11/2023 Cancer Staging   Staging form: Colon and Rectum, AJCC 8th Edition - Clinical stage from 09/11/2023: Stage IVA (cTX, cN1, cM1a) - Signed by Sonja Shawmut, MD on 10/12/2023   10/01/2023 Initial Diagnosis   Adenocarcinoma of descending colon (HCC)   10/24/2023 -  Chemotherapy   Patient is on Treatment Plan : COLORECTAL FOLFOXIRI q14d        CURRENT THERAPY: Neoadjuvant chemo FOLFOXIRI q14 days, starting 10/24/23  INTERVAL HISTORY Ms. Cid returns for follow up and treatment. Last seen by Dr. Maryalice Smaller 10/12/23. Attended chemo class, and underwent port placement and liver biopsy on 4/18.  She tolerated all very well, feels okay just nervous to start chemo but ready.  Bowels moving with MiraLAX  twice daily, denies blood in stool. She has picked up home meds and has no specific questions or concerns.   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   Irregular periods/menstrual cycles 01/30/2012   Late prenatal care 01/30/2012   Poor social situation 01/30/2012   Previous cesarean section complicating pregnancy, antepartum condition or complication 01/27/2016   2 previous cesarean section- Desires repeat with BTL   Single liveborn, born in hospital, delivered by cesarean delivery  08/13/2016   Status post repeat low transverse cesarean section 08/14/2016   Status post tubal ligation at time of delivery, current hosp 08/14/2016     Past Surgical History:  Procedure Laterality Date   CESAREAN SECTION  2009   CESAREAN SECTION  01/30/2012   Procedure: CESAREAN SECTION;  Surgeon: Norville Beery, MD;  Location: [facility];  Service: Gynecology;  Laterality: N/A;  Repeat C/S   CESAREAN SECTION WITH BILATERAL TUBAL LIGATION Bilateral 08/13/2016   Procedure: CESAREAN SECTION WITH BILATERAL TUBAL LIGATION;  Surgeon: Rik Chasten, MD;  Location: [facility]IR[facility] SU[facility];  Service: Obstetrics;  Laterality: Bilateral;   CHOLECYSTECTOMY N/A 10/16/2013   Procedure: LAPAROSCOPIC CHOLECYSTECTOMY WITH INTRAOPERATIVE CHOLANGIOGRAM;  Surgeon: Kari Otto. Eli Grizzle, MD;  Location: [facility] OR;  Service: General;  Laterality: N/A;   COLONOSCOPY WITH PROPOFOL  N/A 09/11/2023   Procedure: COLONOSCOPY WITH PROPOFOL ;  Surgeon: Lajuan Pila, MD;  Location: [facility];  Service: Gastroenterology;  Laterality: N/A;   DILATION AND EVACUATION N/A 07/28/2015   Procedure: SUCTION, DILATATION AND EVACUATION;  Surgeon: Gabrielle Joiner, MD;  Location: [facility];  Service: Gynecology;  Laterality: N/A;   ESOPHAGOGASTRODUODENOSCOPY (EGD) WITH PROPOFOL  N/A 09/11/2023   Procedure: ESOPHAGOGASTRODUODENOSCOPY (EGD) WITH PROPOFOL ;  Surgeon: Lajuan Pila, MD;  Location: [facility];  Service: Gastroenterology;  Laterality: N/A;   IR IMAGING GUIDED PORT INSERTION  10/20/2023   IR US  LIVER BIOPSY  10/20/2023   SUBMUCOSAL INJECTION  09/11/2023   Procedure: INJECTION, SUBMUCOSAL;  Surgeon: Lajuan Pila, MD;  Location: [facility];  Service: Gastroenterology;;   TONSILLECTOMY  AGE 30   TONSILLECTOMY AND ADENOIDECTOMY  AGEB 18     Outpatient Encounter Medications as of 10/24/2023  Medication Sig   amLODipine (NORVASC) 10 MG tablet Take 10 mg by mouth daily.   hydrochlorothiazide (HYDRODIURIL) 25 MG tablet Take 25 mg by mouth daily.    hyoscyamine  (LEVSIN SL) 0.125 MG SL tablet Place 1 tablet (0.125 mg total) under the tongue every 6 (six) hours as needed for cramping (nausea, diarrhea).   ibuprofen  (ADVIL ) 800 MG tablet Take 800 mg by mouth 3 (three) times daily.   lidocaine -prilocaine  (EMLA ) cream Apply to affected area once   meloxicam (MOBIC) 15 MG tablet Take 15 mg by mouth daily.   omeprazole  (PRILOSEC  OTC) 20 MG tablet Take 1 tablet (20 mg total) by mouth daily.   ondansetron  (ZOFRAN ) 8 MG tablet Take 1 tablet (8 mg total) by mouth every 8 (eight) hours as needed for nausea or vomiting. Start on the third day after chemotherapy   ondansetron  (ZOFRAN -ODT) 4 MG disintegrating tablet Take 1 tablet (4 mg total) by mouth every 8 (eight) hours as needed for nausea or vomiting.   potassium chloride  SA (KLOR-CON  M) 20 MEQ tablet Take 1 tablet (20 mEq total) by mouth daily. Please follow up with your primary care doctor regarding additional refills.   prochlorperazine  (COMPAZINE ) 10 MG tablet Take 1 tablet (10 mg total) by mouth every 6 (six) hours as needed for nausea or vomiting (Nausea or vomiting).   traMADol (ULTRAM) 50 MG tablet Take 50 mg by mouth every 6 (six) hours as needed. (Patient not taking: Reported on 10/12/2023)   No facility-administered encounter medications on file as of 10/24/2023.     Today's Vitals   10/24/23 1117 10/24/23 1120  BP:  (!) 147/93  Pulse:  75  Resp:  14  Temp:  98.3 F (36.8 C)  TempSrc:  Temporal  SpO2:  97%  Weight:  242 lb 4.8 oz (109.9 kg)  PainSc: 0-No pain    Body mass index is 42.92 kg/m.   PHYSICAL EXAM GENERAL:alert, no distress and comfortable SKIN: no rash  EYES: sclera clear NECK: enlarged palpable thyroid  LYMPH:  no palpable cervical or supraclavicular lymphadenopathy  LUNGS: clear with normal breathing effort HEART: regular rate & rhythm, no lower extremity edema ABDOMEN: abdomen soft, non-tender and normal bowel sounds NEURO: alert & oriented x 3 with fluent  speech, no focal motor/sensory deficits PAC without erythema    CBC    Component Value Date/Time   WBC 5.5 10/24/2023 1035   WBC 5.8 10/20/2023 1027   RBC 3.90 10/24/2023 1035   HGB 9.8 (L) 10/24/2023 1035   HGB 10.9 (L) 06/02/2016 1105   HCT 29.0 (L) 10/24/2023 1035   HCT 33.9 (L) 06/02/2016 1105   PLT 230 10/24/2023 1035   PLT 189 06/02/2016 1105   MCV 74.4 (L) 10/24/2023 1035   MCV 80 06/02/2016 1105   MCH 25.1 (L) 10/24/2023 1035   MCHC 33.8 10/24/2023 1035   RDW 14.2 10/24/2023 1035   RDW 15.4 06/02/2016 1105   LYMPHSABS 1.5 10/24/2023 1035   LYMPHSABS 2.1 01/27/2016 1545   MONOABS 0.5 10/24/2023 1035   EOSABS 0.0 10/24/2023 1035   EOSABS 0.2 01/27/2016 1545   BASOSABS 0.0 10/24/2023 1035   BASOSABS 0.0 01/27/2016 1545     CMP     Component Value Date/Time   NA 138 10/24/2023 1035   K 3.7 10/24/2023 1035   CL 105 10/24/2023 1035   CO2 28 10/24/2023 1035   GLUCOSE 95 10/24/2023 1035  BUN 8 10/24/2023 1035   CREATININE 0.52 10/24/2023 1035   CALCIUM  9.0 10/24/2023 1035   PROT 7.1 10/24/2023 1035   ALBUMIN 3.7 10/24/2023 1035   AST 8 (L) 10/24/2023 1035   ALT 8 10/24/2023 1035   ALKPHOS 58 10/24/2023 1035   BILITOT 0.6 10/24/2023 1035   GFRNONAA >60 10/24/2023 1035   GFRAA >90 08/28/2014 1950     ASSESSMENT & PLAN: 38 yo female   Adenocarcinoma of descending colon, with oligo liver metastasis, stage IV; MMR normal  -Presented with abdominal pain, change of bowel habits, and a 50 pound weight loss. -Colonoscopy showed malignant obstructive tumor in the distal descending colon, biopsy showed moderate differentiated invasive adenocarcinoma. -CT scan showed a 2.6 cm liver mass, c/w oligo metastasis on MRI -Seen by colorectal surgeon Dr. Hershell Lose on 10/10/23. -Would recommend neoadjuvant chemotherapy for 3 months, followed by surgery (left hemicolectomy, liver resection), or liver ablation or radiation if she responds well to chemo  -Mr. Deharo appears well.  Attended chemo class, underwent port placement and liver biopsy. Tolerated all very well. Managing bowels with miralax . Denies pain.  - I spoke to pathology who confirms the presence of metastatic adenocarcinoma in the liver biopsy, morphologically compatible with colorectal primary, stains are pending and we will likely sign out the result today or tomorrow.  I reviewed this with Ms. Schubring.  She understands she has metastatic colon cancer, the goal remains curative with aggressive treatment and multimodal approach -Again reviewed the potential risk/benefit, rationale, treatment goal, side effects and symptom management of FOLFOXIRI and GCSF. Ready to proceed - Labs reviewed, adequate to proceed with cycle 1 today, no dose modifications - Toxicity check next week -F/up and cycle 2 in 2 weeks, or sooner if needed. She understands when to call     PLAN: -Path, labs, chemo/symptom management reviewed -Proceed with C1 FOLFOXIRI today -GCSF on day 3 -Phone call tox check next week -F/up and cycle 2 in 2 weeks   All questions were answered. The patient knows to call the clinic with any problems, questions or concerns. No barriers to learning were detected. I spent 20 minutes counseling the patient face to face. The total time spent in the appointment was 30 minutes and more than 50% was on counseling, review of test results, and coordination of care.   Oluwaseyi Tull, NP-C 10/24/2023

## 2023-10-24 ENCOUNTER — Inpatient Hospital Stay

## 2023-10-24 ENCOUNTER — Encounter: Payer: Self-pay | Admitting: General Practice

## 2023-10-24 ENCOUNTER — Inpatient Hospital Stay (HOSPITAL_BASED_OUTPATIENT_CLINIC_OR_DEPARTMENT_OTHER): Admitting: Nurse Practitioner

## 2023-10-24 ENCOUNTER — Encounter: Payer: Self-pay | Admitting: Nurse Practitioner

## 2023-10-24 VITALS — BP 147/93 | HR 75 | Temp 98.3°F | Resp 14 | Wt 242.3 lb

## 2023-10-24 DIAGNOSIS — Z5189 Encounter for other specified aftercare: Secondary | ICD-10-CM | POA: Diagnosis not present

## 2023-10-24 DIAGNOSIS — Z79631 Long term (current) use of antimetabolite agent: Secondary | ICD-10-CM | POA: Diagnosis not present

## 2023-10-24 DIAGNOSIS — D649 Anemia, unspecified: Secondary | ICD-10-CM | POA: Diagnosis not present

## 2023-10-24 DIAGNOSIS — Z87891 Personal history of nicotine dependence: Secondary | ICD-10-CM | POA: Diagnosis not present

## 2023-10-24 DIAGNOSIS — C186 Malignant neoplasm of descending colon: Secondary | ICD-10-CM

## 2023-10-24 DIAGNOSIS — C787 Secondary malignant neoplasm of liver and intrahepatic bile duct: Secondary | ICD-10-CM | POA: Diagnosis not present

## 2023-10-24 DIAGNOSIS — Z5111 Encounter for antineoplastic chemotherapy: Secondary | ICD-10-CM | POA: Diagnosis not present

## 2023-10-24 DIAGNOSIS — Z95828 Presence of other vascular implants and grafts: Secondary | ICD-10-CM | POA: Insufficient documentation

## 2023-10-24 DIAGNOSIS — Z79630 Long term (current) use of alkylating agent: Secondary | ICD-10-CM | POA: Diagnosis not present

## 2023-10-24 DIAGNOSIS — Z809 Family history of malignant neoplasm, unspecified: Secondary | ICD-10-CM | POA: Diagnosis not present

## 2023-10-24 DIAGNOSIS — Z79634 Long term (current) use of topoisomerase inhibitor: Secondary | ICD-10-CM | POA: Diagnosis not present

## 2023-10-24 LAB — CMP (CANCER CENTER ONLY)
ALT: 8 U/L (ref 0–44)
AST: 8 U/L — ABNORMAL LOW (ref 15–41)
Albumin: 3.7 g/dL (ref 3.5–5.0)
Alkaline Phosphatase: 58 U/L (ref 38–126)
Anion gap: 5 (ref 5–15)
BUN: 8 mg/dL (ref 6–20)
CO2: 28 mmol/L (ref 22–32)
Calcium: 9 mg/dL (ref 8.9–10.3)
Chloride: 105 mmol/L (ref 98–111)
Creatinine: 0.52 mg/dL (ref 0.44–1.00)
GFR, Estimated: >60 mL/min (ref >60)
Glucose, Bld: 95 mg/dL (ref 70–99)
Potassium: 3.7 mmol/L (ref 3.5–5.1)
Sodium: 138 mmol/L (ref 135–145)
Total Bilirubin: 0.6 mg/dL (ref 0.0–1.2)
Total Protein: 7.1 g/dL (ref 6.5–8.1)

## 2023-10-24 LAB — CBC WITH DIFFERENTIAL (CANCER CENTER ONLY)
Abs Immature Granulocytes: 0 10*3/uL (ref 0.00–0.07)
Basophils Absolute: 0 10*3/uL (ref 0.0–0.1)
Basophils Relative: 0 %
Eosinophils Absolute: 0 10*3/uL (ref 0.0–0.5)
Eosinophils Relative: 1 %
HCT: 29 % — ABNORMAL LOW (ref 36.0–46.0)
Hemoglobin: 9.8 g/dL — ABNORMAL LOW (ref 12.0–15.0)
Immature Granulocytes: 0 %
Lymphocytes Relative: 28 %
Lymphs Abs: 1.5 10*3/uL (ref 0.7–4.0)
MCH: 25.1 pg — ABNORMAL LOW (ref 26.0–34.0)
MCHC: 33.8 g/dL (ref 30.0–36.0)
MCV: 74.4 fL — ABNORMAL LOW (ref 80.0–100.0)
Monocytes Absolute: 0.5 10*3/uL (ref 0.1–1.0)
Monocytes Relative: 9 %
Neutro Abs: 3.4 10*3/uL (ref 1.7–7.7)
Neutrophils Relative %: 62 %
Platelet Count: 230 10*3/uL (ref 150–400)
RBC: 3.9 MIL/uL (ref 3.87–5.11)
RDW: 14.2 % (ref 11.5–15.5)
WBC Count: 5.5 10*3/uL (ref 4.0–10.5)
nRBC: 0 % (ref 0.0–0.2)

## 2023-10-24 LAB — PREGNANCY, URINE: Preg Test, Ur: NEGATIVE

## 2023-10-24 MED ORDER — OXALIPLATIN CHEMO INJECTION 100 MG/20ML
85.0000 mg/m2 | Freq: Once | INTRAVENOUS | Status: AC
  Administered 2023-10-24: 200 mg via INTRAVENOUS
  Filled 2023-10-24: qty 40

## 2023-10-24 MED ORDER — SODIUM CHLORIDE 0.9% FLUSH
10.0000 mL | Freq: Once | INTRAVENOUS | Status: AC
  Administered 2023-10-24: 10 mL

## 2023-10-24 MED ORDER — PALONOSETRON HCL INJECTION 0.25 MG/5ML
0.2500 mg | Freq: Once | INTRAVENOUS | Status: AC
  Administered 2023-10-24: 0.25 mg via INTRAVENOUS
  Filled 2023-10-24: qty 5

## 2023-10-24 MED ORDER — SODIUM CHLORIDE 0.9 % IV SOLN
6200.0000 mg | INTRAVENOUS | Status: DC
  Administered 2023-10-24: 6200 mg via INTRAVENOUS
  Filled 2023-10-24: qty 124

## 2023-10-24 MED ORDER — DEXAMETHASONE SODIUM PHOSPHATE 10 MG/ML IJ SOLN
10.0000 mg | Freq: Once | INTRAMUSCULAR | Status: AC
  Administered 2023-10-24: 10 mg via INTRAVENOUS
  Filled 2023-10-24: qty 1

## 2023-10-24 MED ORDER — DEXTROSE 5 % IV SOLN: INTRAVENOUS | Status: DC

## 2023-10-24 MED ORDER — SODIUM CHLORIDE 0.9 % IV SOLN
165.0000 mg/m2 | Freq: Once | INTRAVENOUS | Status: AC
  Administered 2023-10-24: 400 mg via INTRAVENOUS
  Filled 2023-10-24: qty 15

## 2023-10-24 MED ORDER — HEPARIN SOD (PORK) LOCK FLUSH 100 UNIT/ML IV SOLN: 500.0000 [IU] | Freq: Once | INTRAVENOUS | Status: DC | PRN

## 2023-10-24 MED ORDER — LEUCOVORIN CALCIUM INJECTION 350 MG
400.0000 mg/m2 | Freq: Once | INTRAVENOUS | Status: AC
  Administered 2023-10-24: 900 mg via INTRAVENOUS
  Filled 2023-10-24: qty 45

## 2023-10-24 MED ORDER — ATROPINE SULFATE 1 MG/ML IV SOLN
0.5000 mg | Freq: Once | INTRAVENOUS | Status: DC | PRN
Start: 2023-10-24 — End: 2023-10-24

## 2023-10-24 MED ORDER — SODIUM CHLORIDE 0.9% FLUSH: 10.0000 mL | INTRAVENOUS | Status: DC | PRN

## 2023-10-24 MED ORDER — APREPITANT 130 MG/18ML IV EMUL
130.0000 mg | Freq: Once | INTRAVENOUS | Status: AC
  Administered 2023-10-24: 130 mg via INTRAVENOUS
  Filled 2023-10-24: qty 18

## 2023-10-24 NOTE — Patient Instructions (Signed)
 [facility] - A DEPT OF [facility]  Discharge Instructions: Thank you for choosing [facility] to provide your oncology and hematology care.   If you have a lab appointment with the Cancer Center, please go directly to the Cancer Center and check in at the registration area.   Wear comfortable clothing and clothing appropriate for easy access to any Portacath or PICC line.   We strive to give you quality time with your provider. You may need to reschedule your appointment if you arrive late (15 or more minutes).  Arriving late affects you and other patients whose appointments are after yours.  Also, if you miss three or more appointments without notifying the office, you may be dismissed from the clinic at the provider's discretion.      For prescription refill requests, have your pharmacy contact our office and allow 72 hours for refills to be completed.    Today you received the following chemotherapy and/or immunotherapy agents: fluorouracil  (ADRUCIL ), irinotecan  (CAMPTOSAR ), leucovorin  and oxaliplatin  (ELOXATIN )   To help prevent nausea and vomiting after your treatment, we encourage you to take your nausea medication as directed.  BELOW ARE SYMPTOMS THAT SHOULD BE REPORTED IMMEDIATELY: *FEVER GREATER THAN 100.4 F (38 C) OR HIGHER *CHILLS OR SWEATING *NAUSEA AND VOMITING THAT IS NOT CONTROLLED WITH YOUR NAUSEA MEDICATION *UNUSUAL SHORTNESS OF BREATH *UNUSUAL BRUISING OR BLEEDING *URINARY PROBLEMS (pain or burning when urinating, or frequent urination) *BOWEL PROBLEMS (unusual diarrhea, constipation, pain near the anus) TENDERNESS IN MOUTH AND THROAT WITH OR WITHOUT PRESENCE OF ULCERS (sore throat, sores in mouth, or a toothache) UNUSUAL RASH, SWELLING OR PAIN  UNUSUAL VAGINAL DISCHARGE OR ITCHING   Items with * indicate a potential emergency and should be followed up as soon as possible or go to the Emergency Department if any problems  should occur.  Please show the CHEMOTHERAPY ALERT CARD or IMMUNOTHERAPY ALERT CARD at check-in to the Emergency Department and triage nurse.  Should you have questions after your visit or need to cancel or reschedule your appointment, please contact [facility] - A DEPT OF [facility]  Dept: [phone]  and follow the prompts.  Office hours are 8:00 a.m. to 4:30 p.m. Monday - Friday. Please note that voicemails left after 4:00 p.m. may not be returned until the following business day.  We are closed weekends and major holidays. You have access to a nurse at all times for urgent questions. Please call the main number to the clinic Dept: [phone] and follow the prompts.   For any non-urgent questions, you may also contact your provider using MyChart. We now offer e-Visits for anyone 16 and older to request care online for non-urgent symptoms. For details visit [email or web address].   Also download the MyChart app! Go to the app store, search "MyChart", open the app, select [facility], and log in with your MyChart username and password.

## 2023-10-24 NOTE — Progress Notes (Signed)
 [facility] Spiritual Care Note  Visited with Ashley [name] at her first infusion after connecting by phone so we could meet face to face, bringing handmade blessing blanket as a tangible gesture of support and encouragement. She was in good spirits and notes that this is her natural, baseline way of being and approaching the world. She also practiced gratitude, particularly for her fiance who was present for support through much of her treatment, then took care of the children, and then planned to return to take her home.  Provided compassionate presence, reflective listening, encouragement, and affirmation of gifts.  We plan to follow up at a future treatment/by phone as needed/desired, and she has direct Spiritual Care number.   [address] [name], [place], [facility] Pager [phone] Voicemail [phone]

## 2023-10-24 NOTE — Progress Notes (Signed)
 5-FU dose reduced from 7000mg  (due to automated vial rounding) to 6200mg  (same dose basis=2800mg /m2) based on BSA=2.21 per Lacie, NP.  Ashley [name], PharmD, MBA

## 2023-10-25 ENCOUNTER — Encounter: Payer: Self-pay | Admitting: Hematology

## 2023-10-25 ENCOUNTER — Telehealth: Payer: Self-pay | Admitting: Nurse Practitioner

## 2023-10-25 ENCOUNTER — Telehealth: Payer: Self-pay

## 2023-10-25 LAB — SURGICAL PATHOLOGY

## 2023-10-25 NOTE — Telephone Encounter (Signed)
 Patient scheduled appointments. Patient is aware of all appointment details.

## 2023-10-25 NOTE — Telephone Encounter (Signed)
 Ashley [name]  states that she is doing fine. She is eating, drinking, and urinating well. She knows to call the office at [phone] if  she has any questions or concerns.

## 2023-10-25 NOTE — Telephone Encounter (Signed)
-----   Message from Nurse Evelia Hipp sent at 10/24/2023  4:43 PM EDT ----- Regarding: First time Chemo: Irinotican, Oxaliplatin , Leucavorin and 5-FU Patient of Dr. Maryalice Smaller, First time Chemo: Irinotican, Oxaliplatin , Leucavorin and 5-FU patient tolerated treatment well.

## 2023-10-25 NOTE — Progress Notes (Signed)
 The proposed treatment discussed in conference is for discussion purpose only and is not a binding recommendation.  The patients have not been physically examined, or presented with their treatment options.  Therefore, final treatment plans cannot be decided.

## 2023-10-26 ENCOUNTER — Inpatient Hospital Stay

## 2023-10-26 VITALS — BP 149/81 | HR 78 | Temp 98.8°F | Resp 16

## 2023-10-26 DIAGNOSIS — Z809 Family history of malignant neoplasm, unspecified: Secondary | ICD-10-CM | POA: Diagnosis not present

## 2023-10-26 DIAGNOSIS — Z87891 Personal history of nicotine dependence: Secondary | ICD-10-CM | POA: Diagnosis not present

## 2023-10-26 DIAGNOSIS — Z5189 Encounter for other specified aftercare: Secondary | ICD-10-CM | POA: Diagnosis not present

## 2023-10-26 DIAGNOSIS — D649 Anemia, unspecified: Secondary | ICD-10-CM | POA: Diagnosis not present

## 2023-10-26 DIAGNOSIS — C186 Malignant neoplasm of descending colon: Secondary | ICD-10-CM

## 2023-10-26 DIAGNOSIS — Z79630 Long term (current) use of alkylating agent: Secondary | ICD-10-CM | POA: Diagnosis not present

## 2023-10-26 DIAGNOSIS — C787 Secondary malignant neoplasm of liver and intrahepatic bile duct: Secondary | ICD-10-CM | POA: Diagnosis not present

## 2023-10-26 DIAGNOSIS — Z79634 Long term (current) use of topoisomerase inhibitor: Secondary | ICD-10-CM | POA: Diagnosis not present

## 2023-10-26 DIAGNOSIS — Z79631 Long term (current) use of antimetabolite agent: Secondary | ICD-10-CM | POA: Diagnosis not present

## 2023-10-26 DIAGNOSIS — Z5111 Encounter for antineoplastic chemotherapy: Secondary | ICD-10-CM | POA: Diagnosis not present

## 2023-10-26 MED ORDER — PEGFILGRASTIM-CBQV 6 MG/0.6ML ~~LOC~~ SOSY
6.0000 mg | PREFILLED_SYRINGE | Freq: Once | SUBCUTANEOUS | Status: AC
Start: 2023-10-26 — End: 2023-10-26
  Administered 2023-10-26: 6 mg via SUBCUTANEOUS
  Filled 2023-10-26: qty 0.6

## 2023-10-26 MED ORDER — SODIUM CHLORIDE 0.9% FLUSH
10.0000 mL | INTRAVENOUS | Status: DC | PRN
Start: 2023-10-26 — End: 2023-10-26
  Administered 2023-10-26: 10 mL

## 2023-10-26 MED ORDER — HEPARIN SOD (PORK) LOCK FLUSH 100 UNIT/ML IV SOLN
500.0000 [IU] | Freq: Once | INTRAVENOUS | Status: AC | PRN
  Administered 2023-10-26: 500 [IU]

## 2023-10-26 NOTE — Patient Instructions (Signed)

## 2023-10-27 ENCOUNTER — Other Ambulatory Visit: Payer: Self-pay

## 2023-10-27 ENCOUNTER — Ambulatory Visit (HOSPITAL_COMMUNITY)

## 2023-10-27 ENCOUNTER — Emergency Department (HOSPITAL_COMMUNITY)
Admission: EM | Admit: 2023-10-27 | Discharge: 2023-10-27 | Disposition: A | Discharge status: Home / Self Care | Attending: Emergency Medicine | Admitting: Emergency Medicine

## 2023-10-27 ENCOUNTER — Emergency Department (HOSPITAL_COMMUNITY)

## 2023-10-27 ENCOUNTER — Telehealth: Payer: Self-pay

## 2023-10-27 DIAGNOSIS — S82831A Other fracture of upper and lower end of right fibula, initial encounter for closed fracture: Secondary | ICD-10-CM | POA: Diagnosis not present

## 2023-10-27 DIAGNOSIS — S99911A Unspecified injury of right ankle, initial encounter: Secondary | ICD-10-CM | POA: Diagnosis present

## 2023-10-27 DIAGNOSIS — S82431A Displaced oblique fracture of shaft of right fibula, initial encounter for closed fracture: Secondary | ICD-10-CM | POA: Diagnosis not present

## 2023-10-27 DIAGNOSIS — W1840XA Slipping, tripping and stumbling without falling, unspecified, initial encounter: Secondary | ICD-10-CM | POA: Diagnosis not present

## 2023-10-27 NOTE — ED Provider Notes (Signed)
 Care assumed from Lorin Roemhildt, PA-C at shift change.  Please see their note for further information.  Briefly, patient here with ankle pain from a fall yesterday.  X-rays pending.  No obvious fracture, suspect d/c.  3:30PM: Patient's ankle x-ray has resulted and reveals  1. Minimally displaced oblique fracture through the distal right fibular metadiaphyseal junction, best seen on lateral view. Near anatomic alignment. 2. Diffuse soft tissue swelling.  I have personally reviewed and interpreted this imaging and agree with radiology interpretation.  Discussed fracture with patient who expressed understanding.  Given a cam boot and crutches to wear.  Given referral to orthopedics as well.  Otherwise cleared for discharge per previous providers recommendations.  Evaluation and diagnostic testing in the emergency department does not suggest an emergent condition requiring admission or immediate intervention beyond what has been performed at this time.  Plan for discharge with close PCP follow-up.  Patient is understanding and amenable with plan, educated on red flag symptoms that would prompt immediate return.  Patient discharged in stable condition.    Kaylany Tesoriero A, PA-C 10/27/23 1538    Jerilynn Montenegro, MD 10/27/23 [phone]

## 2023-10-27 NOTE — Telephone Encounter (Signed)
 Received telephone call from the patient. Patient wanted to make Dr. Maryalice Smaller and her team aware that she felt over heated yesterday and fell causing her to hurt her ankle.  Patient stated she was currently at the ED to get her ankle looked at.  Patient stated she felt fine, afebrile, no nausea/vomiting/or diarrhea, or difficulty breathing.  Patient stated she felt like the injection may have triggered the overheating / fall. Patient wanted it noted that next time she gets an injection she wants to wait about 15-20 minutes post administration for observation.  Let patient know that I would forward her message to Dr. Maryalice Smaller and her team. Patient verbalized understanding.   Patient got Folfoxiri on 10/24/23 and got GCSF on 10/26/23. Patient is due to get cycle 2 Day 1 on 11/07/2023.

## 2023-10-27 NOTE — Discharge Instructions (Addendum)
 You were seen in the ER for an ankle injury.  Your x-ray did showed that you have broken your fibula which is the nonweightbearing bone in your leg on the outside.  For this, we have given you a walking boot and crutches.  Please wear the boot at all times and use the crutches as needed.  You will need to follow-up with orthopedics for continued evaluation and management of this fracture.  Please call them to schedule an appointment at your earliest convenience.  We have given you a referral listed in this paperwork.   I recommend ice for swelling, ibuprofen  and/or tylenol  for pain.   Please follow up with your oncology team regarding your symptoms yesterday.  If your ankle is no better in a week, I recommend following up with the orthopedist. I've attached their contact information for you to call and make a follow up appointment.  Continue to monitor how you're doing and return to the ER for new or worsening symptoms.

## 2023-10-27 NOTE — Progress Notes (Signed)
 Orthopedic Tech Progress Note Patient Details:  Ashley [name] Aug 13, 1985 [id number]  Ortho Devices Type of Ortho Device: Ace wrap, Crutches Ortho Device/Splint Location: RLE Ortho Device/Splint Interventions: Application   Post Interventions Patient Tolerated: Well, Ambulated well  Fabian Holster E Vira Chaplin 10/27/2023, 2:56 PM

## 2023-10-27 NOTE — ED Triage Notes (Signed)
 Pt c/o r ankle pain after tripping. R ankle is swollen, with limited ROM.  AOx4

## 2023-10-27 NOTE — ED Provider Notes (Signed)
 [place] EMERGENCY DEPARTMENT AT [facility] Provider Note   CSN: [id number] Arrival date & time: 10/27/23  1221     History  Chief Complaint  Patient presents with   Ankle Pain    Ashley [name] is a 38 y.o. female with history of GERD, irregular menses, who presents emergency department with a right ankle injury after tripping. She felt lightheaded last night after starting new chemo injection recently. Did not have any chest pain, headache, or any other symptoms. Has had no recurrence of symptoms. States she feels "great" today. Follows up with her oncology team in the next 1-2 weeks.    Ankle Pain      Home Medications Prior to Admission medications   Medication Sig Start Date End Date Taking? Authorizing Provider  amLODipine (NORVASC) 10 MG tablet Take 10 mg by mouth daily.    [provider]  hydrochlorothiazide (HYDRODIURIL) 25 MG tablet Take 25 mg by mouth daily.    [provider]  hyoscyamine  (LEVSIN SL) 0.125 MG SL tablet Place 1 tablet (0.125 mg total) under the tongue every 6 (six) hours as needed for cramping (nausea, diarrhea). 07/18/23   Edmonia Gottron, PA-C  ibuprofen  (ADVIL ) 800 MG tablet Take 800 mg by mouth 3 (three) times daily. 02/28/23   [provider]  lidocaine -prilocaine  (EMLA ) cream Apply to affected area once 10/12/23   Sonja Catawba, MD  meloxicam (MOBIC) 15 MG tablet Take 15 mg by mouth daily. 05/11/23   [provider]  omeprazole  (PRILOSEC  OTC) 20 MG tablet Take 1 tablet (20 mg total) by mouth daily. 09/11/23 09/10/24  Lajuan Pila, MD  ondansetron  (ZOFRAN ) 8 MG tablet Take 1 tablet (8 mg total) by mouth every 8 (eight) hours as needed for nausea or vomiting. Start on the third day after chemotherapy 10/12/23   Sonja Volant, MD  ondansetron  (ZOFRAN -ODT) 4 MG disintegrating tablet Take 1 tablet (4 mg total) by mouth every 8 (eight) hours as needed for nausea or vomiting. 04/05/23   Zelaya, Oscar A, PA-C   potassium chloride  SA (KLOR-CON  M) 20 MEQ tablet Take 1 tablet (20 mEq total) by mouth daily. Please follow up with your primary care doctor regarding additional refills. 09/12/23   Lajuan Pila, MD  prochlorperazine  (COMPAZINE ) 10 MG tablet Take 1 tablet (10 mg total) by mouth every 6 (six) hours as needed for nausea or vomiting (Nausea or vomiting). 10/12/23   Sonja , MD      Allergies    Fruit extracts    Review of Systems   Review of Systems  Musculoskeletal:  Positive for arthralgias.  All other systems reviewed and are negative.   Physical Exam Updated Vital Signs BP 128/74 (BP Location: Left Arm)   Pulse (!) 108   Temp 99 F (37.2 C) (Oral)   Resp 18   Ht 5\' 4"  (1.626 m)   Wt 109.8 kg   SpO2 100%   BMI 41.54 kg/m  Physical Exam Vitals and nursing note reviewed.  Constitutional:      Appearance: Normal appearance.  HENT:     Head: Normocephalic and atraumatic.  Eyes:     Conjunctiva/sclera: Conjunctivae normal.  Pulmonary:     Effort: Pulmonary effort is normal. No respiratory distress.  Musculoskeletal:     Comments: Right ankle with generalized swelling, some decreased ROM due to pain, neurovascularly in tact, can range all digits   Skin:    General: Skin is warm and dry.  Neurological:  Mental Status: She is alert.  Psychiatric:        Mood and Affect: Mood normal.        Behavior: Behavior normal.     ED Results / Procedures / Treatments   Labs (all labs ordered are listed, but only abnormal results are displayed) Labs Reviewed - No data to display  EKG None  Radiology No results found.  Procedures Procedures    Medications Ordered in ED Medications - No data to display  ED Course/ Medical Decision Making/ A&P                                 Medical Decision Making Amount and/or Complexity of Data Reviewed Radiology: ordered.  This patient is a 38 y.o. female  who presents to the ED for concern of R ankle pain after injury.    Differential diagnoses prior to evaluation: The emergent differential diagnosis includes, but is not limited to,  fracture, dislocation, ligamentous injury. This is not an exhaustive differential.   Past Medical History / Co-morbidities / Social History: GERD, menses  Physical Exam: Physical exam performed. The pertinent findings include: Right ankle with generalized swelling and reduced ROM due to pain. Strong pulse. Can range all toes. Normal sensation.   Lab Tests/Imaging studies: I personally interpreted labs/imaging and the pertinent results include:  XR per my review and interpretation with no acute fracture.   Considered further workup for syncope yesterday but as patient states she feels incredibly well today, has had no recurrence of symptoms. She feels she stood up too fast and got overheated today. Think it would be reasonable for forego further syncope workup today.   Disposition: After consideration of the diagnostic results and the patients response to treatment, I feel that emergency department workup does not suggest an emergent condition requiring admission or immediate intervention beyond what has been performed at this time.   Patient discussed and care transferred to Smoot PA-C at shift change. Please see his/her note for further details regarding further ED course and disposition. Plan at time of handoff is follow up on XR. Given ace wrap, crutches, and orthopedic follow up.    Final Clinical Impression(s) / ED Diagnoses Final diagnoses:  Injury of right ankle, initial encounter    Rx / DC Orders ED Discharge Orders     None      Portions of this report may have been transcribed using voice recognition software. Every effort was made to ensure accuracy; however, inadvertent computerized transcription errors may be present.    Leva Rayas, PA-C 10/27/23 1505    Merdis Stalling, MD 10/28/23 1714

## 2023-10-31 ENCOUNTER — Inpatient Hospital Stay (HOSPITAL_BASED_OUTPATIENT_CLINIC_OR_DEPARTMENT_OTHER): Admitting: Nurse Practitioner

## 2023-10-31 ENCOUNTER — Encounter: Payer: Self-pay | Admitting: Hematology

## 2023-10-31 ENCOUNTER — Encounter: Payer: Self-pay | Admitting: Nurse Practitioner

## 2023-10-31 DIAGNOSIS — C186 Malignant neoplasm of descending colon: Secondary | ICD-10-CM

## 2023-10-31 DIAGNOSIS — G4733 Obstructive sleep apnea (adult) (pediatric): Secondary | ICD-10-CM | POA: Diagnosis not present

## 2023-10-31 DIAGNOSIS — J9611 Chronic respiratory failure with hypoxia: Secondary | ICD-10-CM | POA: Diagnosis not present

## 2023-10-31 NOTE — Progress Notes (Signed)
 [facility]     Telephone:([phone] Fax:([phone]) [phone]    Patient Care Team: Danella Dunn, MD as PCP - General (Family Medicine) Gerald Kitty, Isiah Mare, RN as Oncology Nurse Navigator Lajuan Pila, MD as Consulting Physician (Gastroenterology) Sonja Laurel, MD as Consulting Physician (Medical Oncology) Gabrielle Joiner, MD as Consulting Physician (Obstetrics and Gynecology) Candyce Champagne, MD as Consulting Physician (General Surgery) Lujean Sake, MD as Consulting Physician (General Surgery)   I connected with Ashley [name] on 10/31/23 at  9:00 AM EDT by telephone visit and verified that I am speaking with the correct person using two identifiers.   I discussed the limitations, risks, security and privacy concerns of performing an evaluation and management service by telemedicine and the availability of in-person appointments. I also discussed with the patient that there may be a patient responsible charge related to this service. The patient expressed understanding and agreed to proceed.   Other persons participating in the visit and their role in the encounter: N/A   Patient's location: Home  Provider's location: [facility] office    CHIEF COMPLAINT: Chemo toxicity check  Oncology History  Adenocarcinoma of descending colon (HCC)  09/11/2023 Cancer Staging   Staging form: Colon and Rectum, AJCC 8th Edition - Clinical stage from 09/11/2023: Stage IVA (cTX, cN1, cM1a) - Signed by Sonja Alfalfa, MD on 10/12/2023   10/01/2023 Initial Diagnosis   Adenocarcinoma of descending colon (HCC)   10/24/2023 -  Chemotherapy   Patient is on Treatment Plan : COLORECTAL FOLFOXIRI q14d        CURRENT THERAPY: FOLFOXIRI with GCSF q14 days, starting 10/24/23  INTERVAL HISTORY Ashley [name] presents by phone as scheduled.  Last seen by me 4/22 with cycle 1 chemo.  On the day of pump DC and injection she came to the cancer center and had not eaten.  She received G-CSF then stood up  to walk out but felt overheated and "passed out."  She came to the ED due to ankle pain and was found to have a fracture, put in a boot and will follow-up with Ortho tomorrow.  She is ambulating okay at home, limping but getting around well, still sore, sees Ortho tomorrow.  Otherwise she tolerated chemo well, nausea was mild and managed with medication which she has not needed in a couple days.  Able to eat and drink well.  Bowels moving a little bit more due to drinking some tea.  Denies abdominal pain or any bleeding, cold sensitivity, neuropathy, mucositis, rash, fever, chills, cough, chest pain, dyspnea, or any other new or specific complaints.   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   Irregular periods/menstrual cycles 01/30/2012   Late prenatal care 01/30/2012   Poor social situation 01/30/2012   Previous cesarean section complicating pregnancy, antepartum condition or complication 01/27/2016   2 previous cesarean section- Desires repeat with BTL   Single liveborn, born in hospital, delivered by cesarean delivery 08/13/2016   Status post repeat low transverse cesarean section 08/14/2016   Status post tubal ligation at time of delivery, current hosp 08/14/2016     Past Surgical History:  Procedure Laterality Date   CESAREAN SECTION  2009   CESAREAN SECTION  01/30/2012   Procedure: CESAREAN SECTION;  Surgeon: Norville Beery, MD;  Location: [facility];  Service: Gynecology;  Laterality: N/A;  Repeat C/S   CESAREAN SECTION WITH BILATERAL TUBAL LIGATION Bilateral 08/13/2016   Procedure: CESAREAN SECTION WITH BILATERAL TUBAL LIGATION;  Surgeon: Rik Chasten, MD;  Location: [facility]TH[facility] SUIT[facility];  Service: Obstetrics;  Laterality: Bilateral;   CHOLECYSTECTOMY N/A 10/16/2013   Procedure: LAPAROSCOPIC CHOLECYSTECTOMY WITH  INTRAOPERATIVE CHOLANGIOGRAM;  Surgeon: Kari Otto. Eli Grizzle, MD;  Location: [facility];  Service: General;  Laterality: N/A;   COLONOSCOPY WITH PROPOFOL  N/A 09/11/2023   Procedure: COLONOSCOPY WITH PROPOFOL ;  Surgeon: Lajuan Pila, MD;  Location: [facility];  Service: Gastroenterology;  Laterality: N/A;   DILATION AND EVACUATION N/A 07/28/2015   Procedure: SUCTION, DILATATION AND EVACUATION;  Surgeon: Gabrielle Joiner, MD;  Location: [facility];  Service: Gynecology;  Laterality: N/A;   ESOPHAGOGASTRODUODENOSCOPY (EGD) WITH PROPOFOL  N/A 09/11/2023   Procedure: ESOPHAGOGASTRODUODENOSCOPY (EGD) WITH PROPOFOL ;  Surgeon: Lajuan Pila, MD;  Location: [facility];  Service: Gastroenterology;  Laterality: N/A;   IR IMAGING GUIDED PORT INSERTION  10/20/2023   IR US  LIVER BIOPSY  10/20/2023   SUBMUCOSAL INJECTION  09/11/2023   Procedure: INJECTION, SUBMUCOSAL;  Surgeon: Lajuan Pila, MD;  Location: [facility];  Service: Gastroenterology;;   TONSILLECTOMY  AGE 1   TONSILLECTOMY AND ADENOIDECTOMY  AGEB 18     Outpatient Encounter Medications as of 10/31/2023  Medication Sig   amLODipine (NORVASC) 10 MG tablet Take 10 mg by mouth daily.   hydrochlorothiazide (HYDRODIURIL) 25 MG tablet Take 25 mg by mouth daily.   hyoscyamine  (LEVSIN SL) 0.125 MG SL tablet Place 1 tablet (0.125 mg total) under the tongue every 6 (six) hours as needed for cramping (nausea, diarrhea).   ibuprofen  (ADVIL ) 800 MG tablet Take 800 mg by mouth 3 (three) times daily.   lidocaine -prilocaine  (EMLA ) cream Apply to affected area once   meloxicam (MOBIC) 15 MG tablet Take 15 mg by mouth daily.   omeprazole  (PRILOSEC  OTC) 20 MG tablet Take 1 tablet (20 mg total) by mouth daily.   ondansetron  (ZOFRAN ) 8 MG tablet Take 1 tablet (8 mg total) by mouth every 8 (eight) hours as needed for nausea or vomiting. Start on the third day after chemotherapy   ondansetron  (ZOFRAN -ODT) 4 MG disintegrating tablet Take 1 tablet (4 mg total) by mouth every 8  (eight) hours as needed for nausea or vomiting.   potassium chloride  SA (KLOR-CON  M) 20 MEQ tablet Take 1 tablet (20 mEq total) by mouth daily. Please follow up with your primary care doctor regarding additional refills.   prochlorperazine  (COMPAZINE ) 10 MG tablet Take 1 tablet (10 mg total) by mouth every 6 (six) hours as needed for nausea or vomiting (Nausea or vomiting).   No facility-administered encounter medications on file as of 10/31/2023.     There were no vitals filed for this visit. There is no height or weight on file to calculate BMI.   ECOG PERFORMANCE STATUS: 1 - Symptomatic but completely ambulatory  PHYSICAL EXAM Patient appears well by phone, voice is strong, speech is clear.  Mood/affect appear normal for situation.  No cough or conversational dyspnea   LAB DATA No labs for this visit       ASSESSMENT & PLAN: 38 yo female    Adenocarcinoma of descending colon, with oligo liver metastasis, stage IV; MMR normal  -Presented with abdominal pain, change of bowel habits, and a 50 pound weight loss. -Colonoscopy showed malignant obstructive tumor in the distal descending colon, biopsy showed  moderate differentiated invasive adenocarcinoma. -CT scan showed a 2.6 cm liver mass, c/w oligo metastasis on MRI -Seen by colorectal surgeon Dr. Hershell Lose on 10/10/23. -Would recommend neoadjuvant chemotherapy for 3 months, followed by surgery (left hemicolectomy, liver resection), or liver ablation or radiation if she responds well to chemo  -liver biopsy confirms the presence of metastatic adenocarcinoma, compatible with colorectal primary. Ms. Riddley understands metastatic colon cancer, low disease burden; the goal remains curative with aggressive treatment and multimodal approach -Began C1 FOLFOXIRI 4/22, tolerated well overall with mild nausea and somewhat loose stools (likely related to miralax  and tea, we reviewed symptom management) -She developed diaphoresis and fell after GCSF,  had not eaten prior, with subsequent minimally displaced ankle fracture. In a boot and recovering well. - F/up with Ortho tomorrow, I messaged Felicity Horseman NP for her input on patient's healing time. Will postpone chemo 1 week if needed.  -Will put more safety measures in place for injection, such as IVF and observation. I encouraged her to eat/drink prior to the appointment -F/up and C2 5/6     PLAN: -ED course and Xray reviewed, f/up ortho 4/30 -Reviewed C1 course and symptom management -F/up Ortho 4/30, messaged and requested NP to let us  know re: healing time and whether to postpone chemo by a week -Next chemo scheduled 5/6 as of now -IVF/observation with GCSF on day 3 -Plan reviewed with Dr. Maryalice Smaller  I discussed the assessment and treatment plan with the patient. The patient was provided an opportunity to ask questions and all were answered. The patient agreed with the plan and demonstrated an understanding of the instructions.   The patient was advised to call back or seek an in-person evaluation if the symptoms worsen or if the condition fails to improve as anticipated. I spent 9 minutes counseling the patient face to face. The total time spent in the appointment was 15 minutes and more than 50% was on counseling, review of test results, and coordination of care.   Laaibah Wartman K Choua Ikner, NP 10/31/2023

## 2023-11-01 ENCOUNTER — Ambulatory Visit: Admitting: Orthopaedic Surgery

## 2023-11-01 ENCOUNTER — Other Ambulatory Visit (INDEPENDENT_AMBULATORY_CARE_PROVIDER_SITE_OTHER)

## 2023-11-01 ENCOUNTER — Encounter: Payer: Self-pay | Admitting: Orthopaedic Surgery

## 2023-11-01 DIAGNOSIS — M25571 Pain in right ankle and joints of right foot: Secondary | ICD-10-CM

## 2023-11-01 NOTE — Progress Notes (Signed)
 Office Visit Note   Patient: Ashley [name]           Date of Birth: [birth year]           MRN: [id number] Visit Date: 11/01/2023              Requested by: Danella Dunn, MD [address] [place],  [place] [address] PCP: Danella Dunn, MD   Assessment & Plan: Visit Diagnoses:  1. Pain in right ankle and joints of right foot     Plan: Assessment and Plan    Nondisplaced right lateral malleolus ankle fracture Nondisplaced ankle fracture with significant pain improvement. Nonoperative management preferred due to concurrent chemotherapy for stage 4 colon cancer. Surgical intervention could delay chemotherapy due to healing and infection risks. Nonoperative management expected to heal fracture, potentially slower due to chemotherapy. - Allow weight bearing with a boot for essential activities only. - Provide a lace-up ASO ankle brace for sleeping and non-weight bearing activities. - Recheck x-ray in two weeks to ensure no shifting of the fracture.      Follow-Up Instructions: Return in about 2 weeks (around 11/15/2023).   Orders:  Orders Placed This Encounter  Procedures   XR Ankle Complete Right   No orders of the defined types were placed in this encounter.    Subjective: Chief Complaint  Patient presents with   Right Ankle - Injury    DOI 10/26/2023    HPI Discussed the use of AI scribe software for clinical note transcription with the patient, who gave verbal consent to proceed.  History of Present Illness   Ashley [name] is a 38 year old female with stage 4 colon cancer who presents with an ankle fracture.  She sustained a right ankle fracture approximately one week ago. Pain has improved significantly, and she is able to walk on it with CAM boot. Pain is present when pressing against the ankle, but there is no significant pain when bearing weight. Initially, there was swelling, which has since decreased. She manages the injury by wrapping her foot and using  a boot when necessary. She elevates her foot while sleeping and can now move it more freely. There are no other systemic symptoms affecting the ankle at this time.      Review of Systems  Constitutional: Negative.   HENT: Negative.    Eyes: Negative.   Respiratory: Negative.    Cardiovascular: Negative.   Endocrine: Negative.   Musculoskeletal: Negative.   Neurological: Negative.   Hematological: Negative.   Psychiatric/Behavioral: Negative.    All other systems reviewed and are negative.    Objective: Vital Signs: There were no vitals taken for this visit.  Physical Exam Vitals and nursing note reviewed.  Constitutional:      Appearance: She is well-developed.  HENT:     Head: Atraumatic.     Nose: Nose normal.  Eyes:     Extraocular Movements: Extraocular movements intact.  Cardiovascular:     Pulses: Normal pulses.  Pulmonary:     Effort: Pulmonary effort is normal.  Abdominal:     Palpations: Abdomen is soft.  Musculoskeletal:     Cervical back: Neck supple.  Skin:    General: Skin is warm.     Capillary Refill: Capillary refill takes less than 2 seconds.  Neurological:     Mental Status: She is alert. Mental status is at baseline.  Psychiatric:        Behavior: Behavior normal.  Thought Content: Thought content normal.        Judgment: Judgment normal.     Ortho Exam Physical Exam   Right ankle exam  - mild tenderness over lateral malleolus - no motion of the fracture - NVI      Specialty Comments:  No specialty comments available.  Imaging: XR Ankle Complete Right Result Date: 11/01/2023 X-rays of the right ankle show a nondisplaced spiral distal fibula fracture without any displacement with weightbearing compared to nonweightbearing x-rays.  Ankle mortise is anatomic.  Mild degenerative spurring in the midfoot and the medial gutter.    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   Liver mass in setting of colon cancer 10/05/2023   Obesity, Class III, BMI 40-49.9 (morbid obesity) (HCC) 10/05/2023   Adenocarcinoma of descending colon (HCC) 10/01/2023   Abdominal pain 09/11/2023   Irregular periods/menstrual cycles 01/30/2012   Obesity 11/13/2011   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   Irregular periods/menstrual cycles 01/30/2012   Late prenatal care 01/30/2012   Poor social situation 01/30/2012   Previous cesarean section complicating pregnancy, antepartum condition or complication 01/27/2016   2 previous cesarean section- Desires repeat with BTL   Single liveborn, born in hospital, delivered by cesarean delivery 08/13/2016   Status post repeat low transverse cesarean section 08/14/2016   Status post tubal ligation at time of delivery, current hosp 08/14/2016    Family History  Problem Relation Age of Onset   Asthma Mother    Hypertension Mother    Early death Mother        AGE 59   Colon cancer Maternal Grandmother 57       late 50s-early 60s   Liver disease Neg Hx    Esophageal cancer Neg Hx     Past Surgical History:  Procedure Laterality Date   CESAREAN SECTION  2009   CESAREAN SECTION  01/30/2012   Procedure: CESAREAN SECTION;  Surgeon: Norville Beery, MD;  Location: [facility];  Service: Gynecology;  Laterality: N/A;  Repeat C/S   CESAREAN SECTION WITH BILATERAL TUBAL LIGATION Bilateral 08/13/2016   Procedure: CESAREAN SECTION WITH BILATERAL TUBAL LIGATION;  Surgeon: Rik Chasten, MD;  Location: [facility]IRTHING SUIT[facility];  Service: Obstetrics;  Laterality: Bilateral;   CHOLECYSTECTOMY N/A 10/16/2013   Procedure: LAPAROSCOPIC CHOLECYSTECTOMY WITH INTRAOPERATIVE CHOLANGIOGRAM;  Surgeon: Kari Otto.  Eli Grizzle, MD;  Location: [facility];  Service: General;  Laterality: N/A;   COLONOSCOPY WITH PROPOFOL  N/A 09/11/2023   Procedure: COLONOSCOPY WITH PROPOFOL ;  Surgeon: Lajuan Pila, MD;  Location: [facility];  Service: Gastroenterology;  Laterality: N/A;   DILATION AND EVACUATION N/A 07/28/2015   Procedure: SUCTION, DILATATION AND EVACUATION;  Surgeon: Gabrielle Joiner, MD;  Location: [facility];  Service: Gynecology;  Laterality: N/A;   ESOPHAGOGASTRODUODENOSCOPY (EGD) WITH PROPOFOL  N/A 09/11/2023   Procedure: ESOPHAGOGASTRODUODENOSCOPY (EGD) WITH PROPOFOL ;  Surgeon: Lajuan Pila, MD;  Location: [facility];  Service: Gastroenterology;  Laterality: N/A;   IR IMAGING GUIDED PORT INSERTION  10/20/2023   IR US  LIVER BIOPSY  10/20/2023   SUBMUCOSAL INJECTION  09/11/2023   Procedure: INJECTION, SUBMUCOSAL;  Surgeon: Lajuan Pila, MD;  Location: [facility];  Service: Gastroenterology;;   TONSILLECTOMY  AGE 38   TONSILLECTOMY AND ADENOIDECTOMY  AGEB 18   Social History   Occupational History   Occupation: Patent examiner  Tobacco Use   Smoking status: Former    Current packs/day: 0.00    Types: Cigarettes    Quit date: 10/31/2009    Years since quitting: 14.0   Smokeless tobacco: Never  Vaping Use   Vaping status: Every Day  Substance and Sexual Activity   Alcohol use: No    Alcohol/week: 0.0 standard drinks of alcohol   Drug use: No   Sexual activity: Yes    Partners: Male    Birth control/protection: None, Surgical    Comment: BTL 08/2016

## 2023-11-02 ENCOUNTER — Encounter: Payer: Self-pay | Admitting: Hematology

## 2023-11-02 ENCOUNTER — Telehealth: Payer: Self-pay | Admitting: Orthopaedic Surgery

## 2023-11-02 NOTE — Telephone Encounter (Signed)
 Patient called would like to know if she could get a RX for a scooter.

## 2023-11-03 ENCOUNTER — Telehealth: Payer: Self-pay | Admitting: Orthopaedic Surgery

## 2023-11-03 NOTE — Telephone Encounter (Signed)
 Patient called Ashley [name] back. She says she will purchase scooter on [employer].

## 2023-11-03 NOTE — Telephone Encounter (Signed)
 Tried to call patient. No answer. LMOM that script for rolling knee scooter will be up front for pickup. I also explained on voicemail that insurance may not cover this scooter, in which case it may be cheaper to purchase on [employer].

## 2023-11-07 ENCOUNTER — Ambulatory Visit: Admitting: Hematology

## 2023-11-07 ENCOUNTER — Other Ambulatory Visit: Payer: Self-pay | Admitting: Nurse Practitioner

## 2023-11-07 ENCOUNTER — Encounter: Admitting: Dietician

## 2023-11-07 ENCOUNTER — Other Ambulatory Visit

## 2023-11-07 ENCOUNTER — Ambulatory Visit

## 2023-11-07 DIAGNOSIS — C186 Malignant neoplasm of descending colon: Secondary | ICD-10-CM

## 2023-11-07 NOTE — Assessment & Plan Note (Addendum)
-  Stage IV with oligometastasis, MMR normal -Presented with abdominal pain, change of bowel habits, and a 50 pound weight loss. -Colonoscopy showed malignant obstructive tumor in the distal descending colon, biopsy showed moderate differentiated invasive adenocarcinoma. -CT scan showed a 2.6 cm oligo liver metastasis.  Biopsy is pending -Patient was seen by colorectal surgeon Dr. Hershell Lose on October 10, 2023. -Would recommend neoadjuvant chemotherapy for 3 months, followed by surgery (left hemicolectomy, liver resection), or liver ablation or radiation if she respond well to chemo  -11/08/2023  -she presents for cycle 2 day 1 of FOLFOXIRI with growth factor support.  Overall, tolerated initial cycle well with some nausea and fatigue. She did have a syncopal episode after which, she suffered a fall.  She did suffer an ankle fracture for which she is being followed by orthopedics.

## 2023-11-07 NOTE — Progress Notes (Signed)
 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 -Colonoscopy showed malignant obstructive tumor in the distal descending colon, biopsy showed moderate differentiated invasive adenocarcinoma. -CT scan showed a 2.6 cm oligo liver metastasis.  Biopsy is pending -Patient was seen by colorectal surgeon Dr. Hershell Lose on October 10, 2023. -Would recommend neoadjuvant chemotherapy for 3 months, followed by surgery (left hemicolectomy, liver resection), or liver ablation or radiation if she respond well to chemo  -11/08/2023  -she presents for cycle 2 day 1 of FOLFOXIRI with growth factor support.  Overall, tolerated initial cycle well with some nausea and fatigue. She did have a syncopal episode after which, she suffered a fall.  She did suffer an ankle fracture for which she is being followed by orthopedics.   Right ankle fracture Patient currently in orthopedic boot on right leg due to prior ankle fracture after cycle 1 chemotherapy with FOLFOXIRI.  Has little associated pain.  Followed by orthopedics for this.  Weakness Experienced on day 3 of cycle 1 of FOLFOXIRI.  Did experience a syncopal episode which caused a fall and subsequent right ankle fracture.  States she has been staying hydrated with water and electrolyte replacement fluids.  Has had no  further episodes since then.  Advised her of strict return protocol for feelings of weakness, fatigue, dizziness, or balance concerns.  She voiced understanding.  Plan Reviewed labs. -Mild anemia with Hgb 9.5 and HCT 29.7.  Recommend OTC prenatal vitamin with iron to increase levels with few negative side effects. -Mild hypokalemia with K of 3.3.  She is on thiazide diuretic and has prescription for potassium 20 mEq which she needs to pick up.  Encouraged her to take this every day without missing any doses to prevent worsening hypokalemia. Proceed with cycle 2 day 1 of FOLFOXIRI.  Growth factor support on day 3. Labs/flush, follow-up, and cycle 3 day 1 FOLFOXIRI in 2 weeks as scheduled.  The patient understands the plans discussed today and is in agreement with them.  She knows to contact our office if she develops concerns prior to her next appointment.  I provided 25 minutes of face-to-face time during this encounter and > 50% was spent counseling as documented under my assessment and plan.    Sharyon Deis, NP  [facility] - A DEPT OF [facility] [address] [place] [address] Dept: [phone] Dept Fax: [phone]   No orders of the defined types were placed in this encounter.     CHIEF COMPLAINT:  CC: adenocarcinoma of descending colon   Current Treatment:  FOLFOXIRI with GCSF q 14 days - started 10/24/2023  INTERVAL HISTORY:  Ashley [name] is here today for repeat clinical assessment. She started chemotherapy 10/24/2023. She tolerated chemo well overall. Did have single episode of feeling over heated and light headed. She had a syncopal episode and fell. Was found to  have gotten an ankle fracture and is being seen by orthopedics. Today, she presents for Cycle 2 day 1.  States she has had few problems since syncopal episode after cycle 1 of chemotherapy.  She has not noted neuropathy.  Appetite is good. She  denies chest pain, chest pressure, or shortness of breath. She denies headaches or visual disturbances. She denies abdominal pain, nausea, vomiting, or changes in bowel or bladder habits.  She denies fevers or chills. She denies pain. Her appetite is good. Her weight has increased 4 pounds over last 2 weeks.  I have reviewed the past medical history, past surgical history, social history and family history with the patient and they are unchanged from previous note.  ALLERGIES:  is allergic to fruit extracts.  MEDICATIONS:  Current Outpatient Medications  Medication Sig Dispense Refill   amLODipine (NORVASC) 10 MG tablet Take 10 mg by mouth daily.     hydrochlorothiazide (HYDRODIURIL) 25 MG tablet Take 25 mg by mouth daily.     hyoscyamine  (LEVSIN SL) 0.125 MG SL tablet Place 1 tablet (0.125 mg total) under the tongue every 6 (six) hours as needed for cramping (nausea, diarrhea). 50 tablet 1   ibuprofen  (ADVIL ) 800 MG tablet Take 800 mg by mouth 3 (three) times daily.     lidocaine -prilocaine  (EMLA ) cream Apply to affected area once 30 g 3   meloxicam (MOBIC) 15 MG tablet Take 15 mg by mouth daily.     omeprazole  (PRILOSEC  OTC) 20 MG tablet Take 1 tablet (20 mg total) by mouth daily. 28 tablet 1   ondansetron  (ZOFRAN ) 8 MG tablet Take 1 tablet (8 mg total) by mouth every 8 (eight) hours as needed for nausea or vomiting. Start on the third day after chemotherapy 30 tablet 1   ondansetron  (ZOFRAN -ODT) 4 MG disintegrating tablet Take 1 tablet (4 mg total) by mouth every 8 (eight) hours as needed for nausea or vomiting. 20 tablet 0   potassium chloride  SA (KLOR-CON  M) 20 MEQ tablet Take 1 tablet (20 mEq total) by mouth daily. Please follow up with your primary care doctor regarding additional refills. 30 tablet 0   prochlorperazine  (COMPAZINE ) 10 MG tablet Take 1 tablet (10 mg total) by mouth every 6 (six) hours as needed for nausea or vomiting (Nausea or vomiting). 30 tablet 1   No current  facility-administered medications for this visit.    HISTORY OF PRESENT ILLNESS:   Oncology History  Adenocarcinoma of descending colon (HCC)  09/11/2023 Cancer Staging   Staging form: Colon and Rectum, AJCC 8th Edition - Clinical stage from 09/11/2023: Stage IVA (cTX, cN1, cM1a) - Signed by Sonja [place], MD on 10/12/2023   10/01/2023 Initial Diagnosis   Adenocarcinoma of descending colon (HCC)   10/24/2023 -  Chemotherapy   Patient is on Treatment Plan : COLORECTAL FOLFOXIRI q14d         REVIEW OF SYSTEMS:   Constitutional: Denies fevers, chills or abnormal weight loss Eyes: Denies blurriness of vision Ears, nose, mouth, throat, and face: Denies mucositis or sore throat Respiratory: Denies cough, dyspnea or wheezes Cardiovascular: Denies palpitation, chest discomfort or lower extremity swelling Gastrointestinal:  Denies nausea, heartburn or change in bowel habits Skin: Denies abnormal skin rashes Lymphatics: Denies new lymphadenopathy or easy bruising Neurological:Denies numbness, tingling or new weaknesses Behavioral/Psych: Mood is stable, no new changes  All other systems were reviewed with the patient and are negative.   VITALS:   Today's Vitals   11/08/23 0853 11/08/23 0957  BP: Aaron Aas)  150/88 (!) 150/88  Pulse: 74 74  Resp: 17 17  Temp: 98.2 F (36.8 C) 98.2 F (36.8 C)  SpO2: 99% 99%  Weight: 246 lb 1.6 oz (111.6 kg) 246 lb 1.6 oz (111.6 kg)  PainSc:  0-No pain   Body mass index is 42.24 kg/m.   Wt Readings from Last 3 Encounters:  10/27/23 242 lb (109.8 kg)  10/24/23 242 lb 4.8 oz (109.9 kg)  10/20/23 242 lb 11.6 oz (110.1 kg)    There is no height or weight on file to calculate BMI.  Performance status (ECOG): 1 - Symptomatic but completely ambulatory  PHYSICAL EXAM:   GENERAL:alert, no distress and comfortable SKIN: skin color, texture, turgor are normal, no rashes or significant lesions EYES: normal, Conjunctiva are pink and non-injected, sclera  clear OROPHARYNX:no exudate, no erythema and lips, buccal mucosa, and tongue normal  NECK: supple, thyroid  normal size, non-tender, without nodularity LYMPH:  no palpable lymphadenopathy in the cervical, axillary or inguinal LUNGS: clear to auscultation and percussion with normal breathing effort HEART: regular rate & rhythm and no murmurs and no lower extremity edema ABDOMEN:abdomen soft, non-tender and normal bowel sounds Musculoskeletal:no cyanosis of digits and no clubbing  NEURO: alert & oriented x 3 with fluent speech, no focal motor/sensory deficits  LABORATORY DATA:  I have reviewed the data as listed    Component Value Date/Time   NA 138 10/24/2023 1035   K 3.7 10/24/2023 1035   CL 105 10/24/2023 1035   CO2 28 10/24/2023 1035   GLUCOSE 95 10/24/2023 1035   BUN 8 10/24/2023 1035   CREATININE 0.52 10/24/2023 1035   CALCIUM  9.0 10/24/2023 1035   PROT 7.1 10/24/2023 1035   ALBUMIN 3.7 10/24/2023 1035   AST 8 (L) 10/24/2023 1035   ALT 8 10/24/2023 1035   ALKPHOS 58 10/24/2023 1035   BILITOT 0.6 10/24/2023 1035   GFRNONAA >60 10/24/2023 1035   GFRAA >90 08/28/2014 1950    Lab Results  Component Value Date   WBC 5.5 10/24/2023   NEUTROABS 3.4 10/24/2023   HGB 9.8 (L) 10/24/2023   HCT 29.0 (L) 10/24/2023   MCV 74.4 (L) 10/24/2023   PLT 230 10/24/2023     RADIOGRAPHIC STUDIES: XR Ankle Complete Right Result Date: 11/01/2023 X-rays of the right ankle show a nondisplaced spiral distal fibula fracture without any displacement with weightbearing compared to nonweightbearing x-rays.  Ankle mortise is anatomic.  Mild degenerative spurring in the midfoot and the medial gutter.  DG Ankle Complete Right Result Date: 10/27/2023 CLINICAL DATA:  Marvell Slider, right ankle injury EXAM: RIGHT ANKLE - COMPLETE 3+ VIEW COMPARISON:  None Available. FINDINGS: Frontal, oblique, and lateral views of the right ankle are obtained. There is a minimally displaced oblique fracture through the distal  fibula metadiaphyseal junction. Alignment is near anatomic. Ankle mortise is intact. Diffuse soft tissue swelling greatest laterally. IMPRESSION: 1. Minimally displaced oblique fracture through the distal right fibular metadiaphyseal junction, best seen on lateral view. Near anatomic alignment. 2. Diffuse soft tissue swelling. Electronically Signed   By: Bobbye Burrow M.D.   On: 10/27/2023 15:06   IR US  LIVER BIOPSY Result Date: 10/20/2023 INDICATION: 38 year old with colon cancer. Suspicious lesion in the central right hepatic lobe. EXAM: ULTRASOUND-GUIDED LIVER LESION BIOPSY MEDICATIONS: Moderate sedation ANESTHESIA/SEDATION: Moderate (conscious) sedation was employed during this procedure. A total of Versed  2.5 mg and Fentanyl  100 mcg was administered intravenously by the radiology nurse. Total intra-service moderate Sedation Time: 37 minutes. The patient's level of consciousness  and vital signs were monitored continuously by radiology nursing throughout the procedure under my direct supervision. FLUOROSCOPY TIME:  None COMPLICATIONS: None immediate. PROCEDURE: Informed written consent was obtained from the patient after a thorough discussion of the procedural risks, benefits and alternatives. All questions were addressed. Maximal Sterile Barrier Technique was utilized including caps, mask, sterile gowns, sterile gloves, sterile drape, hand hygiene and skin antiseptic. A timeout was performed prior to the initiation of the procedure. Liver was evaluated with ultrasound. Central right hepatic lesion was identified. The right side of the abdomen was prepped with chlorhexidine  and sterile field was created. Skin was anesthetized using 1% lidocaine . Small incision was made. Using ultrasound guidance, 17 gauge coaxial needle was directed into the right hepatic lesion. Total of 2 core biopsies were performed with an 18 gauge core device. Two adequate specimens obtained and placed in formalin. Gel-Foam slurry was  injected as the 17 gauge needle was removed. Bandage placed over the puncture site. FINDINGS: Subtle slightly hyperechoic lesion in the central aspect of the liver near the right portal vein. This lesion measured roughly 2.3 cm. Biopsy needle confirmed within the lesion. Two adequate specimens obtained. No immediate bleeding or hematoma formation. IMPRESSION: Successful ultrasound-guided core biopsies from the right hepatic lesion. Electronically Signed   By: Elene Griffes M.D.   On: 10/20/2023 14:25   IR IMAGING GUIDED PORT INSERTION Result Date: 10/20/2023 INDICATION: 38 year old with colon cancer.  Port-A-Cath needed for treatment. EXAM: FLUOROSCOPIC AND ULTRASOUND GUIDED PLACEMENT OF A SUBCUTANEOUS PORT MEDICATIONS: Moderate sedation ANESTHESIA/SEDATION: Moderate (conscious) sedation was employed during this procedure. A total of Versed  4 mg and fentanyl  175 mcg was administered intravenously at the order of the provider performing the procedure. Total intra-service moderate sedation time: 47 minutes. Patient's level of consciousness and vital signs were monitored continuously by radiology nurse throughout the procedure under the supervision of the provider performing the procedure. FLUOROSCOPY TIME:  Radiation Exposure Index (as provided by the fluoroscopic device): 12 mGy Kerma COMPLICATIONS: None immediate. PROCEDURE: The procedure, risks, benefits, and alternatives were explained to the patient. Questions regarding the procedure were encouraged and answered. The patient understands and consents to the procedure. Patient was placed supine on the interventional table. Ultrasound confirmed a patent right internal jugular vein. Ultrasound image was saved for documentation. The right chest and neck were cleaned with a skin antiseptic and a sterile drape was placed. Maximal barrier sterile technique was utilized including caps, mask, sterile gowns, sterile gloves, sterile drape, hand hygiene and skin antiseptic.  The right neck was anesthetized with 1% lidocaine . Small incision was made in the right neck with a blade. Micropuncture set was placed in the right internal jugular vein with ultrasound guidance. The micropuncture wire was used for measurement purposes. The right chest was anesthetized with 1% lidocaine  with epinephrine . #15 blade was used to make an incision and a subcutaneous port pocket was formed. 8 french Power Port was assembled. Subcutaneous tunnel was formed with a stiff tunneling device. The port catheter was brought through the subcutaneous tunnel. The port was placed in the subcutaneous pocket. The micropuncture set was exchanged for a peel-away sheath. The catheter was placed through the peel-away sheath and the tip was positioned at the superior cavoatrial junction. Catheter placement was confirmed with fluoroscopy. The port was accessed and flushed with heparinized saline. The port pocket was closed using two layers of absorbable sutures and Dermabond. The vein skin site was closed using a single layer of absorbable suture and Dermabond. Sterile  dressings were applied. Patient tolerated the procedure well without an immediate complication. Ultrasound and fluoroscopic images were taken and saved for this procedure. IMPRESSION: Placement of a subcutaneous power-injectable port device. Catheter tip at the superior cavoatrial junction. Electronically Signed   By: Elene Griffes M.D.   On: 10/20/2023 14:22   US  Intraoperative Result Date: 10/20/2023 CLINICAL DATA:  Ultrasound was provided for use by the ordering physician.  No provider Interpretation or professional fees incurred.

## 2023-11-08 ENCOUNTER — Inpatient Hospital Stay: Attending: Nurse Practitioner

## 2023-11-08 ENCOUNTER — Inpatient Hospital Stay

## 2023-11-08 ENCOUNTER — Encounter: Payer: Self-pay | Admitting: General Practice

## 2023-11-08 ENCOUNTER — Inpatient Hospital Stay (HOSPITAL_BASED_OUTPATIENT_CLINIC_OR_DEPARTMENT_OTHER): Admitting: Nurse Practitioner

## 2023-11-08 VITALS — BP 153/95 | HR 86 | Resp 16

## 2023-11-08 VITALS — BP 150/88 | HR 74 | Temp 98.2°F | Resp 17 | Wt 246.1 lb

## 2023-11-08 DIAGNOSIS — C186 Malignant neoplasm of descending colon: Secondary | ICD-10-CM | POA: Insufficient documentation

## 2023-11-08 DIAGNOSIS — C787 Secondary malignant neoplasm of liver and intrahepatic bile duct: Secondary | ICD-10-CM | POA: Insufficient documentation

## 2023-11-08 DIAGNOSIS — Z79630 Long term (current) use of alkylating agent: Secondary | ICD-10-CM | POA: Insufficient documentation

## 2023-11-08 DIAGNOSIS — Z79634 Long term (current) use of topoisomerase inhibitor: Secondary | ICD-10-CM | POA: Diagnosis not present

## 2023-11-08 DIAGNOSIS — R5383 Other fatigue: Secondary | ICD-10-CM | POA: Insufficient documentation

## 2023-11-08 DIAGNOSIS — Z5189 Encounter for other specified aftercare: Secondary | ICD-10-CM | POA: Insufficient documentation

## 2023-11-08 DIAGNOSIS — Z5111 Encounter for antineoplastic chemotherapy: Secondary | ICD-10-CM | POA: Diagnosis not present

## 2023-11-08 DIAGNOSIS — Z79631 Long term (current) use of antimetabolite agent: Secondary | ICD-10-CM | POA: Insufficient documentation

## 2023-11-08 DIAGNOSIS — Z95828 Presence of other vascular implants and grafts: Secondary | ICD-10-CM

## 2023-11-08 LAB — CMP (CANCER CENTER ONLY)
ALT: 10 U/L (ref 0–44)
AST: 10 U/L — ABNORMAL LOW (ref 15–41)
Albumin: 3.6 g/dL (ref 3.5–5.0)
Alkaline Phosphatase: 71 U/L (ref 38–126)
Anion gap: 6 (ref 5–15)
BUN: 9 mg/dL (ref 6–20)
CO2: 29 mmol/L (ref 22–32)
Calcium: 8.9 mg/dL (ref 8.9–10.3)
Chloride: 105 mmol/L (ref 98–111)
Creatinine: 0.66 mg/dL (ref 0.44–1.00)
GFR, Estimated: >60 mL/min (ref >60)
Glucose, Bld: 99 mg/dL (ref 70–99)
Potassium: 3.3 mmol/L — ABNORMAL LOW (ref 3.5–5.1)
Sodium: 140 mmol/L (ref 135–145)
Total Bilirubin: 0.3 mg/dL (ref 0.0–1.2)
Total Protein: 6.9 g/dL (ref 6.5–8.1)

## 2023-11-08 LAB — PREGNANCY, URINE: Preg Test, Ur: NEGATIVE

## 2023-11-08 LAB — CBC WITH DIFFERENTIAL (CANCER CENTER ONLY)
Abs Immature Granulocytes: 0.33 10*3/uL — ABNORMAL HIGH (ref 0.00–0.07)
Basophils Absolute: 0.1 10*3/uL (ref 0.0–0.1)
Basophils Relative: 1 %
Eosinophils Absolute: 0.1 10*3/uL (ref 0.0–0.5)
Eosinophils Relative: 2 %
HCT: 29.7 % — ABNORMAL LOW (ref 36.0–46.0)
Hemoglobin: 9.5 g/dL — ABNORMAL LOW (ref 12.0–15.0)
Immature Granulocytes: 5 %
Lymphocytes Relative: 26 %
Lymphs Abs: 1.8 10*3/uL (ref 0.7–4.0)
MCH: 24.2 pg — ABNORMAL LOW (ref 26.0–34.0)
MCHC: 32 g/dL (ref 30.0–36.0)
MCV: 75.8 fL — ABNORMAL LOW (ref 80.0–100.0)
Monocytes Absolute: 0.4 10*3/uL (ref 0.1–1.0)
Monocytes Relative: 6 %
Neutro Abs: 4.1 10*3/uL (ref 1.7–7.7)
Neutrophils Relative %: 60 %
Platelet Count: 323 10*3/uL (ref 150–400)
RBC: 3.92 MIL/uL (ref 3.87–5.11)
RDW: 14.6 % (ref 11.5–15.5)
WBC Count: 6.8 10*3/uL (ref 4.0–10.5)
nRBC: 0 % (ref 0.0–0.2)

## 2023-11-08 MED ORDER — LEUCOVORIN CALCIUM INJECTION 350 MG
400.0000 mg/m2 | Freq: Once | INTRAVENOUS | Status: AC
  Administered 2023-11-08: 900 mg via INTRAVENOUS
  Filled 2023-11-08: qty 45

## 2023-11-08 MED ORDER — OXALIPLATIN CHEMO INJECTION 100 MG/20ML
85.0000 mg/m2 | Freq: Once | INTRAVENOUS | Status: AC
  Administered 2023-11-08: 200 mg via INTRAVENOUS
  Filled 2023-11-08: qty 40

## 2023-11-08 MED ORDER — LORATADINE 10 MG PO TABS
10.0000 mg | ORAL_TABLET | Freq: Every day | ORAL | 0 refills | Status: AC
Start: 2023-11-08 — End: ?

## 2023-11-08 MED ORDER — POTASSIUM CHLORIDE CRYS ER 20 MEQ PO TBCR: 20.0000 meq | EXTENDED_RELEASE_TABLET | Freq: Every day | ORAL | 0 refills | Status: DC

## 2023-11-08 MED ORDER — DEXTROSE 5 % IV SOLN: INTRAVENOUS | Status: DC

## 2023-11-08 MED ORDER — SODIUM CHLORIDE 0.9 % IV SOLN: Freq: Once | INTRAVENOUS | Status: AC

## 2023-11-08 MED ORDER — APREPITANT 130 MG/18ML IV EMUL
130.0000 mg | Freq: Once | INTRAVENOUS | Status: AC
  Administered 2023-11-08: 130 mg via INTRAVENOUS

## 2023-11-08 MED ORDER — PALONOSETRON HCL INJECTION 0.25 MG/5ML
0.2500 mg | Freq: Once | INTRAVENOUS | Status: AC
  Administered 2023-11-08: 0.25 mg via INTRAVENOUS

## 2023-11-08 MED ORDER — SODIUM CHLORIDE 0.9 % IV SOLN
165.0000 mg/m2 | Freq: Once | INTRAVENOUS | Status: AC
  Administered 2023-11-08: 400 mg via INTRAVENOUS
  Filled 2023-11-08: qty 15

## 2023-11-08 MED ORDER — DEXAMETHASONE SODIUM PHOSPHATE 10 MG/ML IJ SOLN
10.0000 mg | Freq: Once | INTRAMUSCULAR | Status: AC
  Administered 2023-11-08: 10 mg via INTRAVENOUS

## 2023-11-08 MED ORDER — SODIUM CHLORIDE 0.9 % IV SOLN
3200.0000 mg/m2 | INTRAVENOUS | Status: DC
  Administered 2023-11-08: 7000 mg via INTRAVENOUS
  Filled 2023-11-08: qty 140

## 2023-11-08 MED ORDER — SODIUM CHLORIDE 0.9% FLUSH
10.0000 mL | Freq: Once | INTRAVENOUS | Status: AC
Start: 2023-11-08 — End: 2023-11-08
  Administered 2023-11-08: 10 mL

## 2023-11-08 MED ORDER — ATROPINE SULFATE 1 MG/ML IV SOLN
0.5000 mg | Freq: Once | INTRAVENOUS | Status: AC | PRN
  Administered 2023-11-08: 0.5 mg via INTRAVENOUS
  Filled 2023-11-08: qty 1

## 2023-11-08 NOTE — Progress Notes (Signed)
 Per Dr. Maryalice Smaller "since she did very well last cycle, will let her try full dose this cycle." Pt is to use mouth wash for potential mucositis. Will proceed with 5-FU 3200mg /m2 with today's tx.  Brittney Caraway, PharmD, MBA

## 2023-11-08 NOTE — Progress Notes (Signed)
 [facility] Spiritual Care Note  Followed up with Ashley [name] in infusion as planned. She was feeling tired and a bit dizzy, so we kept the visit brief. Overall she reports doing well, despite the ankle injury; she is hopeful that it is healing up quickly.   She is aware of ongoing chaplain availability, and we plan to follow up at a future treatment.   [address] [name], [place], [facility] Pager [phone] Voicemail [phone]

## 2023-11-09 ENCOUNTER — Encounter: Payer: Self-pay | Admitting: Hematology

## 2023-11-09 ENCOUNTER — Encounter

## 2023-11-09 NOTE — Progress Notes (Signed)
 Called patient to remind documents needed for her grant. Advised she can email them or give to registration to scan and email to me. She verbalized understanding.  She has my card to do so and for any additional financial questions or concerns.

## 2023-11-10 ENCOUNTER — Telehealth: Payer: Self-pay | Admitting: Genetic Counselor

## 2023-11-10 ENCOUNTER — Inpatient Hospital Stay

## 2023-11-10 ENCOUNTER — Ambulatory Visit: Payer: Self-pay | Admitting: Genetic Counselor

## 2023-11-10 VITALS — BP 148/91 | HR 79 | Temp 98.8°F | Resp 16

## 2023-11-10 DIAGNOSIS — Z79634 Long term (current) use of topoisomerase inhibitor: Secondary | ICD-10-CM | POA: Diagnosis not present

## 2023-11-10 DIAGNOSIS — R5383 Other fatigue: Secondary | ICD-10-CM | POA: Diagnosis not present

## 2023-11-10 DIAGNOSIS — C186 Malignant neoplasm of descending colon: Secondary | ICD-10-CM

## 2023-11-10 DIAGNOSIS — Z5111 Encounter for antineoplastic chemotherapy: Secondary | ICD-10-CM | POA: Diagnosis not present

## 2023-11-10 DIAGNOSIS — C787 Secondary malignant neoplasm of liver and intrahepatic bile duct: Secondary | ICD-10-CM | POA: Diagnosis not present

## 2023-11-10 DIAGNOSIS — Z1379 Encounter for other screening for genetic and chromosomal anomalies: Secondary | ICD-10-CM | POA: Insufficient documentation

## 2023-11-10 DIAGNOSIS — Z79631 Long term (current) use of antimetabolite agent: Secondary | ICD-10-CM | POA: Diagnosis not present

## 2023-11-10 DIAGNOSIS — Z79630 Long term (current) use of alkylating agent: Secondary | ICD-10-CM | POA: Diagnosis not present

## 2023-11-10 DIAGNOSIS — Z5189 Encounter for other specified aftercare: Secondary | ICD-10-CM | POA: Diagnosis not present

## 2023-11-10 MED ORDER — HEPARIN SOD (PORK) LOCK FLUSH 100 UNIT/ML IV SOLN
500.0000 [IU] | Freq: Once | INTRAVENOUS | Status: AC | PRN
  Administered 2023-11-10: 500 [IU]

## 2023-11-10 MED ORDER — ONDANSETRON HCL 4 MG/2ML IJ SOLN
8.0000 mg | Freq: Once | INTRAMUSCULAR | Status: AC
  Administered 2023-11-10: 8 mg via INTRAVENOUS
  Filled 2023-11-10: qty 4

## 2023-11-10 MED ORDER — PEGFILGRASTIM-CBQV 6 MG/0.6ML ~~LOC~~ SOSY
6.0000 mg | PREFILLED_SYRINGE | Freq: Once | SUBCUTANEOUS | Status: AC
Start: 2023-11-10 — End: 2023-11-10
  Administered 2023-11-10: 6 mg via SUBCUTANEOUS

## 2023-11-10 MED ORDER — SODIUM CHLORIDE 0.9 % IV SOLN: Freq: Once | INTRAVENOUS | Status: AC

## 2023-11-10 MED ORDER — SODIUM CHLORIDE 0.9% FLUSH
10.0000 mL | INTRAVENOUS | Status: DC | PRN
  Administered 2023-11-10: 10 mL

## 2023-11-10 NOTE — Patient Instructions (Addendum)
Nausea, Adult Nausea is the feeling of having an upset stomach or that you are about to vomit. Nausea on its own is not usually a serious concern, but it may be an early sign of a more serious medical problem. As nausea gets worse, it can lead to vomiting. If vomiting develops, or if you are not able to drink enough fluids, you are at risk of becoming dehydrated. Dehydration can make you tired and thirsty, cause you to have a dry mouth, and decrease how often you urinate. Older adults and people with other diseases or a weak disease-fighting system (immune system) are at higher risk for dehydration. The main goals of treating your nausea are: To relieve your nausea. To limit repeated nausea episodes. To prevent vomiting and dehydration. Follow these instructions at home: Watch your symptoms for any changes. Tell your health care provider about them. Eating and drinking     Take an oral rehydration solution (ORS). This is a drink that is sold at pharmacies and retail stores. Drink clear fluids slowly and in small amounts as you are able. Clear fluids include water, ice chips, low-calorie sports drinks, and fruit juice that has water added (diluted fruit juice). Eat bland, easy-to-digest foods in small amounts as you are able. These foods include bananas, applesauce, rice, lean meats, toast, and crackers. Avoid drinking fluids that contain a lot of sugar or caffeine, such as energy drinks, sports drinks, and soda. Avoid alcohol. Avoid spicy or fatty foods. General instructions Take over-the-counter and prescription medicines only as told by your health care provider. Rest at home while you recover. Drink enough fluid to keep your urine pale yellow. Breathe slowly and deeply when you feel nauseous. Avoid smelling things that have strong odors. Wash your hands often using soap and water for at least 20 seconds. If soap and water are not available, use hand sanitizer. Make sure that everyone in  your household washes their hands well and often. Keep all follow-up visits. This is important. Contact a health care provider if: Your nausea gets worse. Your nausea does not go away after two days. You vomit multiple times. You cannot drink fluids without vomiting. You have any of the following: New symptoms. A fever. A headache. Muscle cramps. A rash. Pain while urinating. You feel light-headed or dizzy. Get help right away if: You have pain in your chest, neck, arm, or jaw. You feel extremely weak or you faint. You have vomit that is bright red or looks like coffee grounds. You have bloody or black stools (feces) or stools that look like tar. You have a severe headache, a stiff neck, or both. You have severe pain, cramping, or bloating in your abdomen. You have difficulty breathing or are breathing very quickly. Your heart is beating very quickly. Your skin feels cold and clammy. You feel confused. You have signs of dehydration, such as: Dark urine, very little urine, or no urine. Cracked lips. Dry mouth. Sunken eyes. Sleepiness. Weakness. These symptoms may be an emergency. Get help right away. Call 911. Do not wait to see if the symptoms will go away. Do not drive yourself to the hospital. Summary Nausea is the feeling that you have an upset stomach or that you are about to vomit. Nausea on its own is not usually a serious concern, but it may be an early sign of a more serious medical problem. If vomiting develops, or if you are not able to drink enough fluids, you are at risk of becoming   dehydrated. Follow recommendations for eating and drinking and take over-the-counter and prescription medicines only as told by your health care provider. Contact a health care provider right away if your symptoms worsen or you have new symptoms. Keep all follow-up visits. This is important. This information is not intended to replace advice given to you by your health care provider.  Make sure you discuss any questions you have with your health care provider. Document Revised: 12/25/2020 Document Reviewed: 12/25/2020 Elsevier Patient Education  2024 [employer].

## 2023-11-10 NOTE — Progress Notes (Signed)
 HPI:  Ms. Kurczewski was previously seen in the [facility] due to a personal and family history of cancer and concerns regarding a hereditary predisposition to cancer. Please refer to our prior cancer genetics clinic note for more information regarding our discussion, assessment and recommendations, at the time. Ms. Signore recent genetic test results were disclosed to her, as were recommendations warranted by these results. These results and recommendations are discussed in more detail below.  CANCER HISTORY:  Oncology History  Adenocarcinoma of descending colon (HCC)  09/11/2023 Cancer Staging   Staging form: Colon and Rectum, AJCC 8th Edition - Clinical stage from 09/11/2023: Stage IVA (cTX, cN1, cM1a) - Signed by Sonja Decker, MD on 10/12/2023   10/01/2023 Initial Diagnosis   Adenocarcinoma of descending colon (HCC)   10/24/2023 -  Chemotherapy   Patient is on Treatment Plan : COLORECTAL FOLFOXIRI q14d      Genetic Testing   Negative CancerNext +RNAinsight. The [facility] CancerNext+RNAinsight Panel includes sequencing, rearrangement analysis, and RNA analysis for the following 39 genes: APC, ATM, BAP1, BARD1, BMPR1A, BRCA1, BRCA2, BRIP1, CDH1, CDKN2A, CHEK2, FH, FLCN, MET, MLH1, MSH2, MSH6, MUTYH, NF1, NTHL1, PALB2, PMS2, PTEN, RAD51C, RAD51D, SMAD4, STK11, TP53, TSC1, TSC2, and VHL (sequencing and deletion/duplication); AXIN2, HOXB13, MBD4, MSH3, POLD1 and POLE (sequencing only); EPCAM and GREM1 (deletion/duplication only). Report date 11/04/23.      FAMILY HISTORY:  We obtained a detailed, 4-generation family history.  Significant diagnoses are listed below: Family History  Problem Relation Age of Onset   Asthma Mother    Hypertension Mother    Early death Mother        AGE 13   Colon cancer Maternal Grandmother 20       late 50s-early 60s   Liver disease Neg Hx    Esophageal cancer Neg Hx     Ms. Ehler is unaware of previous family history of genetic  testing for hereditary cancer risks. There is no reported [name] ancestry. Ms. Mathes reports her maternal grandmother was diagnosed with colon cancer in her late 29s or early 83s, and passed away in her 50s. She does not report any additional family history of tumors or cancers.      GENETIC TEST RESULTS: Genetic testing reported out on 11/04/23 through the CancerNext +RNAInsight cancer panel found no pathogenic mutations. The [facility] CancerNext+RNAinsight Panel includes sequencing, rearrangement analysis, and RNA analysis for the following 39 genes: APC, ATM, BAP1, BARD1, BMPR1A, BRCA1, BRCA2, BRIP1, CDH1, CDKN2A, CHEK2, FH, FLCN, MET, MLH1, MSH2, MSH6, MUTYH, NF1, NTHL1, PALB2, PMS2, PTEN, RAD51C, RAD51D, SMAD4, STK11, TP53, TSC1, TSC2, and VHL (sequencing and deletion/duplication); AXIN2, HOXB13, MBD4, MSH3, POLD1 and POLE (sequencing only); EPCAM and GREM1 (deletion/duplication only). The test report has been scanned into EPIC and is located under the Molecular Pathology section of the Results Review tab.  A portion of the result report is included below for reference.     We discussed with Ms. Corsello that because current genetic testing is not perfect, it is possible there may be a gene mutation in one of these genes that current testing cannot detect, but that chance is small.  We also discussed, that there could be another gene that has not yet been discovered, or that we have not yet tested, that is responsible for the cancer diagnoses in the family. It is also possible there is a hereditary cause for the cancer in the family that Ms. Broder did not inherit and therefore was not identified in  her testing.  Therefore, it is important to remain in touch with cancer genetics in the future so that we can continue to offer Ms. Strauss the most up to date genetic testing.   ADDITIONAL GENETIC TESTING: We discussed with Ms. Hayner that there are other genes that are associated  with increased cancer risk that can be analyzed. Should Ms. Mesina wish to pursue additional genetic testing, we are happy to discuss and coordinate this testing, at any time.    CANCER SCREENING RECOMMENDATIONS: Ms. Ehresmann test result is considered negative (normal).  This means that we have not identified a hereditary cause for her personal and family history of cancer at this time. Most cancers happen by chance and this negative test suggests that her personal and family history of cancer may fall into this category.    Possible reasons for Ms. Palmer's negative genetic test include:  1. There may be a gene mutation in one of these genes that current testing methods cannot detect but that chance is small.  2. There could be another gene that has not yet been discovered, or that we have not yet tested, that is responsible for the cancer diagnoses in the family.  3.  There may be no hereditary risk for cancer in the family. The cancers in Ms. Cupo and/or her family may be sporadic/familial or due to other genetic and environmental factors. 4. It is also possible there is a hereditary cause for the cancer in the family that Ms. Ekstrom did not inherit.  Therefore, it is recommended she continue to follow the cancer management and screening guidelines provided by her oncology and primary healthcare provider. An individual's cancer risk and medical management are not determined by genetic test results alone. Overall cancer risk assessment incorporates additional factors, including personal medical history, family history, and any available genetic information that may result in a personalized plan for cancer prevention and surveillance  Given Ms. Blackham's personal and family histories, we must interpret these negative results with some caution.  Families with features suggestive of hereditary risk for cancer tend to have multiple family members with cancer, diagnoses in multiple  generations and diagnoses before the age of 29. Ms. Kierce family exhibits some of these features. Thus, this result may simply reflect our current inability to detect all mutations within these genes or there may be a different gene that has not yet been discovered or tested.   An individual's cancer risk and medical management are not determined by genetic test results alone. Overall cancer risk assessment incorporates additional factors, including personal medical history, family history, and any available genetic information that may result in a personalized plan for cancer prevention and surveillance.  RECOMMENDATIONS FOR FAMILY MEMBERS:  Individuals in this family might be at some increased risk of developing cancer, over the general population risk, simply due to the family history of cancer.  We recommended women in this family have a yearly mammogram beginning at age 62, or 105 years younger than the earliest onset of cancer, an annual clinical breast exam, and perform monthly breast self-exams. Women in this family should also have a gynecological exam as recommended by their primary provider. All family members should be referred for colonoscopy starting at age 77 (10 years younger than the earliest onset of cancer) and should repeat colonoscopy at least every 5 years.  FOLLOW-UP: Lastly, we discussed with Ms. Podolak that cancer genetics is a rapidly advancing field and it is possible that new genetic tests will  be appropriate for her and/or her family members in the future. We encouraged her to remain in contact with cancer genetics on an annual basis so we can update her personal and family histories and let her know of advances in cancer genetics that may benefit this family.   Our contact number was provided. Ms. Cifelli questions were answered to her satisfaction, and she knows she is welcome to call us  at anytime with additional questions or concerns.   Jobie Mulders, MS,  [facility] Licensed, Retail [email or web address]

## 2023-11-10 NOTE — Telephone Encounter (Signed)
 I spoke to Ashley [name] to review results of genetic testing. she had genetic testing with [facility]'s CancerNext +RNAinsight panel. Testing did not identify any variants known to increase the risk for cancer.  Discussed that we do not know why she has colon cancer or why there is cancer in the family. It could be due to a different gene that we are not testing, or maybe our current technology may not be able to pick something up.  It will be important for her to keep in contact with genetics to keep up with whether additional testing may be needed.  Please see counseling note for further detail on this result.

## 2023-11-12 ENCOUNTER — Encounter: Payer: Self-pay | Admitting: Hematology

## 2023-11-12 ENCOUNTER — Encounter: Payer: Self-pay | Admitting: Nurse Practitioner

## 2023-11-15 ENCOUNTER — Ambulatory Visit: Admitting: Orthopaedic Surgery

## 2023-11-17 ENCOUNTER — Ambulatory Visit: Admitting: Orthopaedic Surgery

## 2023-11-17 ENCOUNTER — Other Ambulatory Visit (INDEPENDENT_AMBULATORY_CARE_PROVIDER_SITE_OTHER): Payer: Self-pay

## 2023-11-17 DIAGNOSIS — M25571 Pain in right ankle and joints of right foot: Secondary | ICD-10-CM

## 2023-11-17 NOTE — Progress Notes (Signed)
 Office Visit Note   Patient: Ashley [name]           Date of Birth: March 16, 1986           MRN: [id number] Visit Date: 11/17/2023              Requested by: Danella Dunn, MD [address] [place],  [place] [address] PCP: Danella Dunn, MD   Assessment & Plan: Visit Diagnoses:  1. Pain in right ankle and joints of right foot     Plan: History of Present Illness Ashley [name] is a 38 year old female who presents for follow-up of a right ankle fracture.  She sustained a right ankle fracture on October 27, 2023, and has been using a walking boot for the past three weeks. Her ankle feels 'wonderful' and she can move it in various directions without pain. She is not taking any medications for the ankle. She can stand and walk without the boot and wears a lace-up ankle brace at night. There is a reduction in limping and minimal swelling in the ankle. Slight tenderness is present in the fracture area. No significant pain is present, and there is minimal swelling in the ankle.  Physical Exam MUSCULOSKELETAL: Right ankle with minimal swelling, good range of motion, and mild tenderness along fracture area.  Results RADIOLOGY Right ankle X-ray: AP view: no visible fracture, alignment normal. Lateral view: visible fracture line, no displacement, healing well. (10/27/2023)  Assessment and Plan Right lateral malleolus ankle fracture Three weeks post-injury with significant improvement. X-rays show healing with anatomic alignment. Mild tenderness present. - Continue boot during the day for three weeks. - Continue ASO brace at night. - Order follow-up x-rays in three weeks. - Plan physical therapy post next visit.  Follow-Up Instructions: Return in about 3 weeks (around 12/08/2023).   Orders:  Orders Placed This Encounter  Procedures   XR Ankle Complete Right   No orders of the defined types were placed in this encounter.   Subjective: Chief Complaint  Patient presents with    Right Ankle - Follow-up      Review of Systems  Constitutional: Negative.   HENT: Negative.    Eyes: Negative.   Respiratory: Negative.    Cardiovascular: Negative.   Endocrine: Negative.   Musculoskeletal: Negative.   Neurological: Negative.   Hematological: Negative.   Psychiatric/Behavioral: Negative.    All other systems reviewed and are negative.    Objective: Vital Signs: There were no vitals taken for this visit.  Physical Exam Vitals and nursing note reviewed.  Constitutional:      Appearance: She is well-developed.  HENT:     Head: Normocephalic and atraumatic.  Pulmonary:     Effort: Pulmonary effort is normal.  Abdominal:     Palpations: Abdomen is soft.  Musculoskeletal:     Cervical back: Neck supple.  Skin:    General: Skin is warm.     Capillary Refill: Capillary refill takes less than 2 seconds.  Neurological:     Mental Status: She is alert and oriented to person, place, and time.  Psychiatric:        Behavior: Behavior normal.        Thought Content: Thought content normal.        Judgment: Judgment normal.     Imaging: XR Ankle Complete Right Result Date: 11/17/2023 X-rays of the right ankle demonstrate progressive fracture healing of the lateral malleolus.    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 10/05/2023   Liver mass in setting of colon cancer 10/05/2023   Obesity, Class III, BMI 40-49.9 (morbid obesity) 10/05/2023   Adenocarcinoma of descending colon (HCC) 10/01/2023   Abdominal pain 09/11/2023   Irregular periods/menstrual cycles 01/30/2012   Obesity 11/13/2011   Past Medical History:  Diagnosis Date   GERD (gastroesophageal reflux disease) 2012   diet controlled - no meds   Headache(784.0)    3X WEEK   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   Irregular periods/menstrual cycles 01/30/2012   Late prenatal care 01/30/2012   Poor social situation 01/30/2012   Previous cesarean section complicating pregnancy, antepartum condition or complication 01/27/2016   2 previous cesarean section- Desires repeat with BTL   Single liveborn, born in hospital, delivered by cesarean delivery 08/13/2016   Status post repeat low transverse cesarean section 08/14/2016   Status post tubal ligation at time of delivery, current hosp 08/14/2016    Family History  Problem Relation Age of Onset   Asthma Mother    Hypertension Mother    Early death Mother        AGE 59   Colon cancer Maternal Grandmother 52       late 50s-early 60s   Liver disease Neg Hx    Esophageal cancer Neg Hx     Past Surgical History:  Procedure Laterality Date   CESAREAN SECTION  2009   CESAREAN SECTION  01/30/2012   Procedure: CESAREAN SECTION;  Surgeon: Norville Beery, MD;  Location: [facility];  Service: Gynecology;  Laterality: N/A;  Repeat C/S   CESAREAN SECTION WITH BILATERAL TUBAL LIGATION Bilateral 08/13/2016   Procedure: CESAREAN SECTION WITH BILATERAL TUBAL LIGATION;  Surgeon: Rik Chasten, MD;  Location: [facility];  Service: Obstetrics;  Laterality: Bilateral;   CHOLECYSTECTOMY N/A 10/16/2013   Procedure: LAPAROSCOPIC CHOLECYSTECTOMY WITH INTRAOPERATIVE CHOLANGIOGRAM;  Surgeon: Kari Otto. Eli Grizzle, MD;  Location: [facility];  Service: General;  Laterality: N/A;   COLONOSCOPY WITH PROPOFOL  N/A 09/11/2023   Procedure: COLONOSCOPY WITH PROPOFOL ;  Surgeon: Lajuan Pila, MD;  Location: [facility];  Service: Gastroenterology;  Laterality: N/A;   DILATION AND EVACUATION N/A 07/28/2015   Procedure: SUCTION, DILATATION AND EVACUATION;  Surgeon: Gabrielle Joiner, MD;  Location: [facility];  Service: Gynecology;  Laterality: N/A;   ESOPHAGOGASTRODUODENOSCOPY (EGD) WITH PROPOFOL  N/A 09/11/2023   Procedure: ESOPHAGOGASTRODUODENOSCOPY (EGD) WITH PROPOFOL ;  Surgeon:  Lajuan Pila, MD;  Location: [facility];  Service: Gastroenterology;  Laterality: N/A;   IR IMAGING GUIDED PORT INSERTION  10/20/2023   IR US  LIVER BIOPSY  10/20/2023   SUBMUCOSAL INJECTION  09/11/2023   Procedure: INJECTION, SUBMUCOSAL;  Surgeon: Lajuan Pila, MD;  Location: [facility];  Service: Gastroenterology;;   TONSILLECTOMY  AGE 9   TONSILLECTOMY AND ADENOIDECTOMY  AGEB 18   Social History   Occupational History   Occupation: Patent examiner  Tobacco Use   Smoking status: Former    Current packs/day: 0.00    Types: Cigarettes    Quit date: 10/31/2009    Years since quitting: 14.0   Smokeless tobacco: Never  Vaping Use   Vaping status: Every Day  Substance and Sexual Activity   Alcohol use: No    Alcohol/week: 0.0 standard drinks of alcohol   Drug use: No   Sexual activity: Yes    Partners:  Male    Birth control/protection: None, Surgical    Comment: BTL 08/2016

## 2023-11-21 ENCOUNTER — Other Ambulatory Visit

## 2023-11-21 ENCOUNTER — Ambulatory Visit

## 2023-11-21 ENCOUNTER — Encounter: Payer: Self-pay | Admitting: General Practice

## 2023-11-21 ENCOUNTER — Inpatient Hospital Stay (HOSPITAL_BASED_OUTPATIENT_CLINIC_OR_DEPARTMENT_OTHER): Admitting: Hematology

## 2023-11-21 ENCOUNTER — Inpatient Hospital Stay: Admitting: Dietician

## 2023-11-21 ENCOUNTER — Encounter: Payer: Self-pay | Admitting: Hematology

## 2023-11-21 ENCOUNTER — Ambulatory Visit: Admitting: Hematology

## 2023-11-21 ENCOUNTER — Inpatient Hospital Stay

## 2023-11-21 VITALS — BP 152/88 | HR 87 | Temp 98.1°F | Resp 18 | Wt 244.5 lb

## 2023-11-21 DIAGNOSIS — Z79631 Long term (current) use of antimetabolite agent: Secondary | ICD-10-CM | POA: Diagnosis not present

## 2023-11-21 DIAGNOSIS — R5383 Other fatigue: Secondary | ICD-10-CM | POA: Diagnosis not present

## 2023-11-21 DIAGNOSIS — Z95828 Presence of other vascular implants and grafts: Secondary | ICD-10-CM

## 2023-11-21 DIAGNOSIS — Z79634 Long term (current) use of topoisomerase inhibitor: Secondary | ICD-10-CM | POA: Diagnosis not present

## 2023-11-21 DIAGNOSIS — C186 Malignant neoplasm of descending colon: Secondary | ICD-10-CM | POA: Diagnosis not present

## 2023-11-21 DIAGNOSIS — Z79630 Long term (current) use of alkylating agent: Secondary | ICD-10-CM | POA: Diagnosis not present

## 2023-11-21 DIAGNOSIS — Z5189 Encounter for other specified aftercare: Secondary | ICD-10-CM | POA: Diagnosis not present

## 2023-11-21 DIAGNOSIS — Z5111 Encounter for antineoplastic chemotherapy: Secondary | ICD-10-CM | POA: Diagnosis not present

## 2023-11-21 DIAGNOSIS — C787 Secondary malignant neoplasm of liver and intrahepatic bile duct: Secondary | ICD-10-CM | POA: Diagnosis not present

## 2023-11-21 LAB — CBC WITH DIFFERENTIAL (CANCER CENTER ONLY)
Abs Immature Granulocytes: 0.24 10*3/uL — ABNORMAL HIGH (ref 0.00–0.07)
Basophils Absolute: 0.1 10*3/uL (ref 0.0–0.1)
Basophils Relative: 1 %
Eosinophils Absolute: 0.1 10*3/uL (ref 0.0–0.5)
Eosinophils Relative: 3 %
HCT: 30.2 % — ABNORMAL LOW (ref 36.0–46.0)
Hemoglobin: 9.6 g/dL — ABNORMAL LOW (ref 12.0–15.0)
Immature Granulocytes: 4 %
Lymphocytes Relative: 30 %
Lymphs Abs: 1.6 10*3/uL (ref 0.7–4.0)
MCH: 24.1 pg — ABNORMAL LOW (ref 26.0–34.0)
MCHC: 31.8 g/dL (ref 30.0–36.0)
MCV: 75.7 fL — ABNORMAL LOW (ref 80.0–100.0)
Monocytes Absolute: 0.5 10*3/uL (ref 0.1–1.0)
Monocytes Relative: 9 %
Neutro Abs: 2.9 10*3/uL (ref 1.7–7.7)
Neutrophils Relative %: 53 %
Platelet Count: 200 10*3/uL (ref 150–400)
RBC: 3.99 MIL/uL (ref 3.87–5.11)
RDW: 15.3 % (ref 11.5–15.5)
WBC Count: 5.5 10*3/uL (ref 4.0–10.5)
nRBC: 0 % (ref 0.0–0.2)

## 2023-11-21 LAB — CMP (CANCER CENTER ONLY)
ALT: 12 U/L (ref 0–44)
AST: 10 U/L — ABNORMAL LOW (ref 15–41)
Albumin: 3.6 g/dL (ref 3.5–5.0)
Alkaline Phosphatase: 93 U/L (ref 38–126)
Anion gap: 6 (ref 5–15)
BUN: 8 mg/dL (ref 6–20)
CO2: 30 mmol/L (ref 22–32)
Calcium: 8.7 mg/dL — ABNORMAL LOW (ref 8.9–10.3)
Chloride: 105 mmol/L (ref 98–111)
Creatinine: 0.56 mg/dL (ref 0.44–1.00)
GFR, Estimated: >60 mL/min (ref >60)
Glucose, Bld: 99 mg/dL (ref 70–99)
Potassium: 3.4 mmol/L — ABNORMAL LOW (ref 3.5–5.1)
Sodium: 141 mmol/L (ref 135–145)
Total Bilirubin: 0.2 mg/dL (ref 0.0–1.2)
Total Protein: 6.7 g/dL (ref 6.5–8.1)

## 2023-11-21 LAB — PREGNANCY, URINE: Preg Test, Ur: NEGATIVE

## 2023-11-21 MED ORDER — APREPITANT 130 MG/18ML IV EMUL
130.0000 mg | Freq: Once | INTRAVENOUS | Status: AC
  Administered 2023-11-21: 130 mg via INTRAVENOUS
  Filled 2023-11-21: qty 18

## 2023-11-21 MED ORDER — LEUCOVORIN CALCIUM INJECTION 350 MG
400.0000 mg/m2 | Freq: Once | INTRAVENOUS | Status: AC
  Administered 2023-11-21: 900 mg via INTRAVENOUS
  Filled 2023-11-21: qty 45

## 2023-11-21 MED ORDER — ATROPINE SULFATE 1 MG/ML IV SOLN
0.5000 mg | Freq: Once | INTRAVENOUS | Status: AC | PRN
  Administered 2023-11-21: 0.5 mg via INTRAVENOUS
  Filled 2023-11-21: qty 1

## 2023-11-21 MED ORDER — OXALIPLATIN CHEMO INJECTION 100 MG/20ML
85.0000 mg/m2 | Freq: Once | INTRAVENOUS | Status: AC
  Administered 2023-11-21: 200 mg via INTRAVENOUS
  Filled 2023-11-21: qty 30.25

## 2023-11-21 MED ORDER — SODIUM CHLORIDE 0.9 % IV SOLN
150.0000 mg/m2 | Freq: Once | INTRAVENOUS | Status: AC
  Administered 2023-11-21: 340 mg via INTRAVENOUS
  Filled 2023-11-21: qty 15

## 2023-11-21 MED ORDER — SODIUM CHLORIDE 0.9% FLUSH
10.0000 mL | Freq: Once | INTRAVENOUS | Status: AC
Start: 2023-11-21 — End: 2023-11-21
  Administered 2023-11-21: 10 mL

## 2023-11-21 MED ORDER — PALONOSETRON HCL INJECTION 0.25 MG/5ML
0.2500 mg | Freq: Once | INTRAVENOUS | Status: AC
Start: 2023-11-21 — End: 2023-11-21
  Administered 2023-11-21: 0.25 mg via INTRAVENOUS
  Filled 2023-11-21: qty 5

## 2023-11-21 MED ORDER — SODIUM CHLORIDE 0.9 % IV SOLN
3200.0000 mg/m2 | INTRAVENOUS | Status: DC
  Administered 2023-11-21: 7000 mg via INTRAVENOUS
  Filled 2023-11-21: qty 140

## 2023-11-21 MED ORDER — DEXTROSE 5 % IV SOLN: INTRAVENOUS | Status: DC

## 2023-11-21 MED ORDER — SODIUM CHLORIDE 0.9 % IV SOLN
Freq: Once | INTRAVENOUS | Status: AC
Start: 2023-11-21 — End: 2023-11-21

## 2023-11-21 MED ORDER — DEXAMETHASONE SODIUM PHOSPHATE 10 MG/ML IJ SOLN
10.0000 mg | Freq: Once | INTRAMUSCULAR | Status: AC
  Administered 2023-11-21: 10 mg via INTRAVENOUS
  Filled 2023-11-21: qty 1

## 2023-11-21 NOTE — Progress Notes (Signed)
 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 She is planning neoadjuvant chemotherapy with Folfirinox q14d (first 4/22). Patient is under the care of Dr. Maryalice Smaller.   Met with patient in infusion. She reports doing great. Occasionally pt will push oral intake, but overall this has been good. She is tolerating chemotherapy well. Denies cold sensitivity, altered taste, nausea, vomiting, diarrhea, constipation.    Medications: reviewed   Labs: K 3.4  Anthropometrics: Wt 244 lb 8 oz today - trending down  5/7 - 246 lb 1.6 oz 4/15 - 250 lb 8 oz   NUTRITION DIAGNOSIS: Unintended wt loss continues    INTERVENTION:  Encourage high calorie high protein foods  Strive for wt maintenance     MONITORING, EVALUATION, GOAL: wt trends, intake   NEXT VISIT: To be scheduled as needed

## 2023-11-21 NOTE — Assessment & Plan Note (Signed)
-  Stage IV with oligometastasis, MSS, HER2 (+), UGT1A1 (+), KRAS/NRAS/BRAF wild type -Presented with abdominal pain, change of bowel habits, and a 50 pound weight loss. -Colonoscopy showed malignant obstructive tumor in the distal descending colon, biopsy showed moderate differentiated invasive adenocarcinoma. -CT scan showed a 2.6 cm oligo liver metastasis.  Biopsy is pending -Patient was seen by colorectal surgeon Dr. Hershell Lose on October 10, 2023. -Would recommend neoadjuvant chemotherapy for 3 months, followed by surgery (left hemicolectomy, liver resection), or liver ablation or radiation if she respond well to chemo  -She started chemo FOLFOXIRI on 10/24/2023 -I discussed the NGS [employer] result and recommending Vectibix to her chemo

## 2023-11-21 NOTE — Progress Notes (Signed)
 [facility]   Telephone:([phone] Fax:(336) [phone]   Clinic Follow up Note   Patient Care Team: Danella Dunn, MD as PCP - General (Family Medicine) Gerald Kitty, Isiah Mare, RN as Oncology Nurse Navigator Lajuan Pila, MD as Consulting Physician (Gastroenterology) Sonja San Luis, MD as Consulting Physician (Medical Oncology) Gabrielle Joiner, MD as Consulting Physician (Obstetrics and Gynecology) Candyce Champagne, MD as Consulting Physician (General Surgery) Lujean Sake, MD as Consulting Physician (General Surgery)  Date of Service:  11/21/2023  CHIEF COMPLAINT: f/u of colon cancer  CURRENT THERAPY:  FOLFOXIRI   Oncology History   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 -Colonoscopy showed malignant obstructive tumor in the distal descending colon, biopsy showed moderate differentiated invasive adenocarcinoma. -CT scan showed a 2.6 cm oligo liver metastasis.  Biopsy is pending -Patient was seen by colorectal surgeon Dr. Hershell Lose on October 10, 2023. -Would recommend neoadjuvant chemotherapy for 3 months, followed by surgery (left hemicolectomy, liver resection), or liver ablation or radiation if she respond well to chemo  -She started chemo FOLFOXIRI on 10/24/2023 -I discussed the NGS Tempus result and recommending Vectibix to her chemo   Assessment & Plan Metastatic colon cancer Metastatic colon cancer on the left side. She has completed two cycles of chemotherapy with increased dosage in the second cycle. Reports mild nausea and decreased appetite post-chemotherapy, but no significant diarrhea or vomiting. Cold sensitivity and weight are stable. Tempus test results show negative for KRAS, NRAS, and BRAF mutations, indicating potential benefit from EGFR antibody therapy. HER2 positive, but antibody treatment  for this is reserved for future use if progression occurs.  -She is tolerating the current chemotherapy regimen well with well-managed blood counts. The addition of an intravenous antibody therapy Vectibix is planned for the next cycle, with potential side effects including acneiform rash, diarrhea, and hypomagnesemia. Outcome statistics indicate better response and longer survival for left-sided metastatic disease without these mutations. - Continue current chemotherapy regimen. - Monitor for side effects of antibody therapy, including acneiform rash, diarrhea, and hypomagnesemia. - Prescribe clindamycin antibiotic cream and hydrocortisone cream for rash management. - Monitor magnesium  levels. - Plan CT scan of abdomen and pelvis with contrast after cycle five or six to assess response. - Consider surgery if good response to chemotherapy.  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 She is clinically tolerating the current chemotherapy regimen well despite this mutation. The chemotherapy dose was slightly increased due to the enzyme mutation, while keeping other parameters the same. - Continue monitoring liver function and drug tolerance. - Slightly increase chemotherapy dose due to enzyme mutation, while keeping other parameters the same.  Plan - She is tolerating chemotherapy well overall, lab reviewed, CMP still pending, if adequate we will proceed cycle 3 chemo today with slightly reduced dose of irinotecan  - Plan to add Vectibix from next cycle - I discussed the NGS Tempus result with patient in detail - Follow-up in 2 weeks   SUMMARY OF ONCOLOGIC HISTORY: Oncology History  Adenocarcinoma of descending colon (HCC)  09/11/2023 Cancer Staging   Staging form: Colon and Rectum, AJCC 8th Edition - Clinical stage from 09/11/2023: Stage IVA (cTX, cN1, cM1a) - Signed by  Sonja Belvidere, MD on 10/12/2023   10/01/2023 Initial Diagnosis   Adenocarcinoma of descending colon (  HCC)   10/24/2023 -  Chemotherapy   Patient is on Treatment Plan : COLORECTAL FOLFOXIRI q14d      Genetic Testing   Negative CancerNext +RNAinsight. The [employer] CancerNext+RNAinsight Panel includes sequencing, rearrangement analysis, and RNA analysis for the following 39 genes: APC, ATM, BAP1, BARD1, BMPR1A, BRCA1, BRCA2, BRIP1, CDH1, CDKN2A, CHEK2, FH, FLCN, MET, MLH1, MSH2, MSH6, MUTYH, NF1, NTHL1, PALB2, PMS2, PTEN, RAD51C, RAD51D, SMAD4, STK11, TP53, TSC1, TSC2, and VHL (sequencing and deletion/duplication); AXIN2, HOXB13, MBD4, MSH3, POLD1 and POLE (sequencing only); EPCAM and GREM1 (deletion/duplication only). Report date 11/04/23.       Discussed the use of AI scribe software for clinical note transcription with the patient, who gave verbal consent to proceed.  History of Present Illness Ashley [name] is a 38 year old female with metastatic colon cancer who presents for follow-up after two cycles of chemotherapy.  She has completed two cycles of chemotherapy, with an increased dose in the second cycle. Mild dizziness occurred during the last cycle but resolved in the clinic. She did not experience significant nausea or diarrhea. Her appetite is slightly reduced but manageable, and her weight remains stable. No cold sensitivity, numbness, or tingling is present. She experienced a fall during the second round of chemotherapy, resulting in an ankle injury that is healing well.     All other systems were reviewed with the patient and are negative.  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   Irregular periods/menstrual cycles 01/30/2012   Late prenatal care 01/30/2012   Poor social situation 01/30/2012   Previous cesarean section complicating  pregnancy, antepartum condition or complication 01/27/2016   2 previous cesarean section- Desires repeat with BTL   Single liveborn, born in hospital, delivered by cesarean delivery 08/13/2016   Status post repeat low transverse cesarean section 08/14/2016   Status post tubal ligation at time of delivery, current hosp 08/14/2016    SURGICAL HISTORY: Past Surgical History:  Procedure Laterality Date   CESAREAN SECTION  2009   CESAREAN SECTION  01/30/2012   Procedure: CESAREAN SECTION;  Surgeon: Norville Beery, MD;  Location: [facility];  Service: Gynecology;  Laterality: N/A;  Repeat C/S   CESAREAN SECTION WITH BILATERAL TUBAL LIGATION Bilateral 08/13/2016   Procedure: CESAREAN SECTION WITH BILATERAL TUBAL LIGATION;  Surgeon: Rik Chasten, MD;  Location: [facility];  Service: Obstetrics;  Laterality: Bilateral;   CHOLECYSTECTOMY N/A 10/16/2013   Procedure: LAPAROSCOPIC CHOLECYSTECTOMY WITH INTRAOPERATIVE CHOLANGIOGRAM;  Surgeon: Kari Otto. Eli Grizzle, MD;  Location: [facility];  Service: General;  Laterality: N/A;   COLONOSCOPY WITH PROPOFOL  N/A 09/11/2023   Procedure: COLONOSCOPY WITH PROPOFOL ;  Surgeon: Lajuan Pila, MD;  Location: [facility];  Service: Gastroenterology;  Laterality: N/A;   DILATION AND EVACUATION N/A 07/28/2015   Procedure: SUCTION, DILATATION AND EVACUATION;  Surgeon: Gabrielle Joiner, MD;  Location: [facility];  Service: Gynecology;  Laterality: N/A;   ESOPHAGOGASTRODUODENOSCOPY (EGD) WITH PROPOFOL  N/A 09/11/2023   Procedure: ESOPHAGOGASTRODUODENOSCOPY (EGD) WITH PROPOFOL ;  Surgeon: Lajuan Pila, MD;  Location: [facility];  Service: Gastroenterology;  Laterality: N/A;   IR IMAGING GUIDED PORT INSERTION  10/20/2023   IR US  LIVER BIOPSY  10/20/2023   SUBMUCOSAL INJECTION  09/11/2023   Procedure: INJECTION, SUBMUCOSAL;  Surgeon: Lajuan Pila, MD;  Location: [facility];  Service: Gastroenterology;;   TONSILLECTOMY  AGE 62   TONSILLECTOMY AND ADENOIDECTOMY  AGEB 18    I have  reviewed the social history and family history with the patient and they are unchanged from previous note.  ALLERGIES:  is allergic to fruit extracts.  MEDICATIONS:  Current Outpatient Medications  Medication Sig Dispense Refill   amLODipine (NORVASC) 10 MG tablet Take 10 mg by mouth daily.     hydrochlorothiazide (HYDRODIURIL) 25 MG tablet Take 25 mg by mouth daily.     hyoscyamine  (LEVSIN SL) 0.125 MG SL tablet Place 1 tablet (0.125 mg total) under the tongue every 6 (six) hours as needed for cramping (nausea, diarrhea). 50 tablet 1   ibuprofen  (ADVIL ) 800 MG tablet Take 800 mg by mouth 3 (three) times daily.     lidocaine -prilocaine  (EMLA ) cream Apply to affected area once 30 g 3   loratadine  (CLARITIN ) 10 MG tablet Take 1 tablet (10 mg total) by mouth daily. 90 tablet 0   meloxicam (MOBIC) 15 MG tablet Take 15 mg by mouth daily.     omeprazole  (PRILOSEC  OTC) 20 MG tablet Take 1 tablet (20 mg total) by mouth daily. 28 tablet 1   ondansetron  (ZOFRAN ) 8 MG tablet Take 1 tablet (8 mg total) by mouth every 8 (eight) hours as needed for nausea or vomiting. Start on the third day after chemotherapy 30 tablet 1   ondansetron  (ZOFRAN -ODT) 4 MG disintegrating tablet Take 1 tablet (4 mg total) by mouth every 8 (eight) hours as needed for nausea or vomiting. 20 tablet 0   potassium chloride  SA (KLOR-CON  M) 20 MEQ tablet Take 1 tablet (20 mEq total) by mouth daily. Please follow up with your primary care doctor regarding additional refills. 30 tablet 0   prochlorperazine  (COMPAZINE ) 10 MG tablet Take 1 tablet (10 mg total) by mouth every 6 (six) hours as needed for nausea or vomiting (Nausea or vomiting). 30 tablet 1   No current facility-administered medications for this visit.    PHYSICAL EXAMINATION: ECOG PERFORMANCE STATUS: 1 - Symptomatic but completely ambulatory  Vitals:   11/21/23 0818 11/21/23 0823  BP: (!) 160/88 (!) 152/88  Pulse: 87   Resp: 18   Temp: 98.1 F (36.7 C)   SpO2: 99%     Wt Readings from Last 3 Encounters:  11/21/23 244 lb 8 oz (110.9 kg)  11/08/23 246 lb 1.6 oz (111.6 kg)  10/27/23 242 lb (109.8 kg)     GENERAL:alert, no distress and comfortable SKIN: skin color, texture, turgor are normal, no rashes or significant lesions EYES: normal, Conjunctiva are pink and non-injected, sclera clear NECK: supple, thyroid  normal size, non-tender, without nodularity LYMPH:  no palpable lymphadenopathy in the cervical, axillary  LUNGS: clear to auscultation and percussion with normal breathing effort HEART: regular rate & rhythm and no murmurs and no lower extremity edema ABDOMEN:abdomen soft, non-tender and normal bowel sounds Musculoskeletal:no cyanosis of digits and no clubbing  NEURO: alert & oriented x 3 with fluent speech, no focal motor/sensory deficits  Physical Exam    LABORATORY DATA:  I have reviewed the data as listed    Latest Ref Rng & Units 11/21/2023    7:54 AM 11/08/2023    8:02 AM 10/24/2023   10:35 AM  CBC  WBC 4.0 - 10.5 K/uL 5.5  6.8  5.5   Hemoglobin 12.0 - 15.0 g/dL 9.6  9.5  9.8   Hematocrit 36.0 - 46.0 % 30.2  29.7  29.0   Platelets 150 - 400 K/uL 200  323  230         Latest Ref Rng & Units 11/21/2023    7:54 AM 11/08/2023    8:02 AM 10/24/2023   10:35 AM  CMP  Glucose 70 - 99 mg/dL 99  99  95   BUN 6 - 20 mg/dL 8  9  8    Creatinine 0.44 - 1.00 mg/dL 1.61  0.96  0.45   Sodium 135 - 145 mmol/L 141  140  138   Potassium 3.5 - 5.1 mmol/L 3.4  3.3  3.7   Chloride 98 - 111 mmol/L 105  105  105   CO2 22 - 32 mmol/L 30  29  28    Calcium  8.9 - 10.3 mg/dL 8.7  8.9  9.0   Total Protein 6.5 - 8.1 g/dL 6.7  6.9  7.1   Total Bilirubin 0.0 - 1.2 mg/dL 0.2  0.3  0.6   Alkaline Phos 38 - 126 U/L 93  71  58   AST 15 - 41 U/L 10  10  8    ALT 0 - 44 U/L 12  10  8        RADIOGRAPHIC STUDIES: I have personally reviewed the radiological images as listed and agreed with the findings in the report. No results found.    Orders Placed  This Encounter  Procedures   CT ABDOMEN PELVIS W CONTRAST    Standing Status:   Future    Expected Date:   12/26/2023    Expiration Date:   11/20/2024    If indicated for the ordered procedure, I authorize the administration of contrast media per Radiology protocol:   Yes    Does the patient have a contrast media/X-ray dye allergy?:   No    Is patient pregnant?:   No    Preferred imaging location?:   [facility]    If indicated for the ordered procedure, I authorize the administration of oral contrast media per Radiology protocol:   Yes   CBC with Differential (Cancer Center Only)    Standing Status:   Future    Expected Date:   12/05/2023    Expiration Date:   12/04/2024   CMP (Cancer Center only)    Standing Status:   Future    Expected Date:   12/05/2023    Expiration Date:   12/04/2024   CBC with Differential (Cancer Center Only)    Standing Status:   Future    Expected Date:   12/19/2023    Expiration Date:   12/18/2024   CMP (Cancer Center only)    Standing Status:   Future    Expected Date:   12/19/2023    Expiration Date:   12/18/2024   CBC with Differential (Cancer Center Only)    Standing Status:   Future    Expected Date:   01/02/2024    Expiration Date:   01/01/2025   CMP (Cancer Center only)    Standing Status:   Future    Expected Date:   01/02/2024    Expiration Date:   01/01/2025   Magnesium     Standing Status:   Standing    Number of Occurrences:   30    Expiration Date:   11/20/2024   All questions were answered. The patient knows to call the clinic with any problems, questions or concerns. No barriers to learning was detected. The total time spent in the appointment was 40 minutes, including review of chart and various tests results, discussions about plan  of care and coordination of care plan     Sonja Harvey, MD 11/21/2023

## 2023-11-21 NOTE — Progress Notes (Signed)
 [facility] Spiritual Care Note  Followed up with [name] in infusion. She was more upbeat and animated during this encounter, noting that she feels herself again. She reports experiencing some intermittent feelings related to mortality, but generally maintains a positive attitude and refocuses quickly on the present moment, both for herself and to be supportive for her children.  Served as a compassionate listening presence. Ashley [name] values chaplain check-ins. We plan to follow up at a future treatment, and she knows to reach out as needed/desired, as well.   [address] [name], [place], [facility] Pager [phone] Voicemail [phone]

## 2023-11-21 NOTE — Patient Instructions (Signed)
 [facility] - A DEPT OF [facility]  Discharge Instructions: Thank you for choosing [facility] to provide your oncology and hematology care.   If you have a lab appointment with the Cancer Center, please go directly to the Cancer Center and check in at the registration area.   Wear comfortable clothing and clothing appropriate for easy access to any Portacath or PICC line.   We strive to give you quality time with your provider. You may need to reschedule your appointment if you arrive late (15 or more minutes).  Arriving late affects you and other patients whose appointments are after yours.  Also, if you miss three or more appointments without notifying the office, you may be dismissed from the clinic at the provider's discretion.      For prescription refill requests, have your pharmacy contact our office and allow 72 hours for refills to be completed.    Today you received the following chemotherapy and/or immunotherapy agents: Irinotecan , Oxaliplatin , and Fluorouracil        To help prevent nausea and vomiting after your treatment, we encourage you to take your nausea medication as directed.  BELOW ARE SYMPTOMS THAT SHOULD BE REPORTED IMMEDIATELY: *FEVER GREATER THAN 100.4 F (38 C) OR HIGHER *CHILLS OR SWEATING *NAUSEA AND VOMITING THAT IS NOT CONTROLLED WITH YOUR NAUSEA MEDICATION *UNUSUAL SHORTNESS OF BREATH *UNUSUAL BRUISING OR BLEEDING *URINARY PROBLEMS (pain or burning when urinating, or frequent urination) *BOWEL PROBLEMS (unusual diarrhea, constipation, pain near the anus) TENDERNESS IN MOUTH AND THROAT WITH OR WITHOUT PRESENCE OF ULCERS (sore throat, sores in mouth, or a toothache) UNUSUAL RASH, SWELLING OR PAIN  UNUSUAL VAGINAL DISCHARGE OR ITCHING   Items with * indicate a potential emergency and should be followed up as soon as possible or go to the Emergency Department if any problems should occur.  Please show the  CHEMOTHERAPY ALERT CARD or IMMUNOTHERAPY ALERT CARD at check-in to the Emergency Department and triage nurse.  Should you have questions after your visit or need to cancel or reschedule your appointment, please contact [facility] - A DEPT OF [facility]  Dept: [phone]  and follow the prompts.  Office hours are 8:00 a.m. to 4:30 p.m. Monday - Friday. Please note that voicemails left after 4:00 p.m. may not be returned until the following business day.  We are closed weekends and major holidays. You have access to a nurse at all times for urgent questions. Please call the main number to the clinic Dept: [phone] and follow the prompts.   For any non-urgent questions, you may also contact your provider using MyChart. We now offer e-Visits for anyone 67 and older to request care online for non-urgent symptoms. For details visit [email or web address].   Also download the MyChart app! Go to the app store, search "MyChart", open the app, select [place], and log in with your MyChart username and password.

## 2023-11-23 ENCOUNTER — Inpatient Hospital Stay

## 2023-11-23 ENCOUNTER — Other Ambulatory Visit: Payer: Self-pay | Admitting: Hematology

## 2023-11-23 ENCOUNTER — Encounter

## 2023-11-23 VITALS — BP 143/95 | HR 93 | Temp 98.6°F | Resp 18

## 2023-11-23 DIAGNOSIS — Z95828 Presence of other vascular implants and grafts: Secondary | ICD-10-CM

## 2023-11-23 DIAGNOSIS — R5383 Other fatigue: Secondary | ICD-10-CM | POA: Diagnosis not present

## 2023-11-23 DIAGNOSIS — Z79631 Long term (current) use of antimetabolite agent: Secondary | ICD-10-CM | POA: Diagnosis not present

## 2023-11-23 DIAGNOSIS — Z79634 Long term (current) use of topoisomerase inhibitor: Secondary | ICD-10-CM | POA: Diagnosis not present

## 2023-11-23 DIAGNOSIS — Z79630 Long term (current) use of alkylating agent: Secondary | ICD-10-CM | POA: Diagnosis not present

## 2023-11-23 DIAGNOSIS — Z5111 Encounter for antineoplastic chemotherapy: Secondary | ICD-10-CM | POA: Diagnosis not present

## 2023-11-23 DIAGNOSIS — C186 Malignant neoplasm of descending colon: Secondary | ICD-10-CM | POA: Diagnosis not present

## 2023-11-23 DIAGNOSIS — C787 Secondary malignant neoplasm of liver and intrahepatic bile duct: Secondary | ICD-10-CM | POA: Diagnosis not present

## 2023-11-23 DIAGNOSIS — Z5189 Encounter for other specified aftercare: Secondary | ICD-10-CM | POA: Diagnosis not present

## 2023-11-23 MED ORDER — SODIUM CHLORIDE 0.9% FLUSH
10.0000 mL | INTRAVENOUS | Status: DC | PRN
  Administered 2023-11-23: 10 mL

## 2023-11-23 MED ORDER — PEGFILGRASTIM-CBQV 6 MG/0.6ML ~~LOC~~ SOSY
6.0000 mg | PREFILLED_SYRINGE | Freq: Once | SUBCUTANEOUS | Status: AC
Start: 2023-11-23 — End: 2023-11-23
  Administered 2023-11-23: 6 mg via SUBCUTANEOUS
  Filled 2023-11-23: qty 0.6

## 2023-11-23 MED ORDER — HEPARIN SOD (PORK) LOCK FLUSH 100 UNIT/ML IV SOLN
500.0000 [IU] | Freq: Once | INTRAVENOUS | Status: AC | PRN
  Administered 2023-11-23: 500 [IU]

## 2023-11-23 MED ORDER — SODIUM CHLORIDE 0.9 % IV SOLN: Freq: Once | INTRAVENOUS | Status: AC

## 2023-11-23 NOTE — Patient Instructions (Signed)

## 2023-11-23 NOTE — Progress Notes (Signed)
 Per Dr. Maryalice Smaller ok to infuse normal saline over 1 hour

## 2023-11-30 DIAGNOSIS — J9611 Chronic respiratory failure with hypoxia: Secondary | ICD-10-CM | POA: Diagnosis not present

## 2023-11-30 DIAGNOSIS — G4733 Obstructive sleep apnea (adult) (pediatric): Secondary | ICD-10-CM | POA: Diagnosis not present

## 2023-12-04 ENCOUNTER — Other Ambulatory Visit: Payer: Self-pay | Admitting: Nurse Practitioner

## 2023-12-04 DIAGNOSIS — C186 Malignant neoplasm of descending colon: Secondary | ICD-10-CM

## 2023-12-04 NOTE — Progress Notes (Signed)
 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 -Colonoscopy showed malignant obstructive tumor in the distal descending colon, biopsy showed moderate differentiated invasive adenocarcinoma. -CT scan showed a 2.6 cm oligo liver metastasis.  Biopsy is pending -Patient was seen by colorectal surgeon Dr. Hershell Lose on October 10, 2023. -Would recommend neoadjuvant chemotherapy for 3 months, followed by surgery (left hemicolectomy, liver resection), or liver ablation or radiation if she respond well to chemo  -She started chemo FOLFOXIRI on 10/24/2023 -NGS Tempus results showed Tempus negative for KRAS, NRAS, and BRAF mutations, indicating potential benefit from EGFR antibody therapy. HER2 positive, but antibody treatment for this is reserved for future use if progression occurs.  --Vectibix  expected to be added with Cycle 4 FOLFOXIRI.    Chemotherapy related nausea Patient has found that drinking increasing level of water and certain foods make nausea better than multiple prescription medications.  Will continue to follow her regimen of increased water with BRAT diet.  She will take prescription medications as needed and as indicated.  Plan Labs reviewed. - CBC shows mild and stable anemia with Hgb 10.4 and HCT  31.6.  Unremarkable CMP.  Magnesium  is 1.9. Advised taking antiemetics as needed and as prescribed.  Continue with increased water intake.  Follow BRAT diet.  Avoid spicy foods as they seem to make nausea more severe. Labs and patient presentation are appropriate for chemotherapy today. -Proceed with cycle 4-day 1 of chemotherapy with FOLFOXIRI with addition of Vectibix  today. -- Reviewed possible skin dermatitis on face and palms of hands which can develop as a result of Vectibix  addition.  She will notify clinic if the symptoms develop. Labs/flush, follow-up, and cycle 5 as scheduled.  The patient understands the plans discussed today and is in agreement with them.  She knows to contact our office if she develops concerns prior to her next appointment.  I provided 25 minutes of face-to-face time during this encounter and > 50% was spent counseling as documented under my assessment and plan.    Sharyon Deis, NP  [facility] - A DEPT OF [facility] [address] [place] [address] Dept: [phone] Dept Fax: [phone]   No orders of the defined types were placed in this encounter.     CHIEF COMPLAINT:  CC: adenocarcinoma of descending colon   Current Treatment:  FOLFOXIRI with addition of Vectibix  starting with cycle 4.  INTERVAL HISTORY:  Ashley [name] is here today for repeat clinical assessment. She was last seen by Dr. Maryalice Smaller on 11/21/2023. She presents for Cycle 4 day 1 FOLFOXIRI today. Addition of Vectibix  expected with today's chemotherapy.  Reports feeling well overall.  She states first 2 to 3 days after chemotherapy are more difficult.  Does better after removal of 5-FU pump.  She does have some mild anxiety.  Has some word thoughts  which make it difficult to sleep sometimes. She denies chest pain, chest pressure, or shortness of breath. She denies headaches or visual disturbances. She denies abdominal pain,  nausea, vomiting, or changes in bowel or bladder habits.  She denies fevers or chills. She denies pain. Her appetite is fair. Her weight has been stable.  I have reviewed the past medical history, past surgical history, social history and family history with the patient and they are unchanged from previous note.  ALLERGIES:  is allergic to fruit extracts.  MEDICATIONS:  Current Outpatient Medications  Medication Sig Dispense Refill   amLODipine (NORVASC) 10 MG tablet Take 10 mg by mouth daily.     hydrochlorothiazide (HYDRODIURIL) 25 MG tablet Take 25 mg by mouth daily.     hyoscyamine  (LEVSIN  SL) 0.125 MG SL tablet Place 1 tablet (0.125 mg total) under the tongue every 6 (six) hours as needed for cramping (nausea, diarrhea). 50 tablet 1   ibuprofen  (ADVIL ) 800 MG tablet Take 800 mg by mouth 3 (three) times daily.     lidocaine -prilocaine  (EMLA ) cream Apply to affected area once 30 g 3   loratadine  (CLARITIN ) 10 MG tablet Take 1 tablet (10 mg total) by mouth daily. 90 tablet 0   meloxicam (MOBIC) 15 MG tablet Take 15 mg by mouth daily.     omeprazole  (PRILOSEC  OTC) 20 MG tablet Take 1 tablet (20 mg total) by mouth daily. 28 tablet 1   ondansetron  (ZOFRAN ) 8 MG tablet Take 1 tablet (8 mg total) by mouth every 8 (eight) hours as needed for nausea or vomiting. Start on the third day after chemotherapy 30 tablet 1   ondansetron  (ZOFRAN -ODT) 4 MG disintegrating tablet Take 1 tablet (4 mg total) by mouth every 8 (eight) hours as needed for nausea or vomiting. 20 tablet 0   potassium chloride  SA (KLOR-CON  M) 20 MEQ tablet TAKE 1 TABLET DAILY. PLEASE FOLLOW UP WITH YOUR PRIMARY CARE DOCTOR REGARDING ADDITIONAL REFILLS. 30 tablet 0   prochlorperazine  (COMPAZINE ) 10 MG tablet Take 1 tablet (10 mg total) by mouth every 6 (six) hours as needed for nausea or vomiting (Nausea or vomiting). 30 tablet 1   No current facility-administered medications for this visit.    HISTORY OF PRESENT ILLNESS:    Oncology History  Adenocarcinoma of descending colon (HCC)  09/11/2023 Cancer Staging   Staging form: Colon and Rectum, AJCC 8th Edition - Clinical stage from 09/11/2023: Stage IVA (cTX, cN1, cM1a) - Signed by Sonja Conway, MD on 10/12/2023   10/01/2023 Initial Diagnosis   Adenocarcinoma of descending colon (HCC)   10/24/2023 -  Chemotherapy   Patient is on Treatment Plan : COLORECTAL FOLFOXIRI q14d      Genetic Testing   Negative CancerNext +RNAinsight. The [facility] CancerNext+RNAinsight Panel includes sequencing, rearrangement analysis, and RNA analysis for the following 39 genes: APC, ATM, BAP1, BARD1, BMPR1A, BRCA1, BRCA2, BRIP1, CDH1, CDKN2A, CHEK2, FH, FLCN, MET, MLH1, MSH2, MSH6, MUTYH, NF1, NTHL1, PALB2, PMS2, PTEN, RAD51C, RAD51D, SMAD4, STK11, TP53, TSC1, TSC2, and VHL (sequencing and deletion/duplication); AXIN2, HOXB13, MBD4, MSH3, POLD1 and POLE (sequencing only); EPCAM and GREM1 (deletion/duplication only). Report date 11/04/23.        REVIEW OF SYSTEMS:   Constitutional: Denies fevers, chills or abnormal weight loss Eyes: Denies blurriness of vision Ears, nose, mouth, throat, and face: Denies mucositis or sore throat Respiratory: Denies cough, dyspnea or wheezes Cardiovascular: Denies palpitation, chest discomfort or lower extremity swelling Gastrointestinal:  Denies nausea, heartburn or change in bowel habits Skin: Denies  abnormal skin rashes Lymphatics: Denies new lymphadenopathy or easy bruising Neurological:Denies numbness, tingling or new weaknesses Behavioral/Psych: Mood is stable, no new changes  All other systems were reviewed with the patient and are negative.   VITALS:   Today's Vitals   12/17/23 1509  BP: (!) 147/83  Pulse: 81  Resp: 18  Temp: 98.3 F (36.8 C)  SpO2: 100%   There is no height or weight on file to calculate BMI.    Wt Readings from Last 3 Encounters:  12/05/23 243 lb 12 oz (110.6 kg)  11/21/23 244 lb 8 oz (110.9 kg)  11/08/23 246 lb  1.6 oz (111.6 kg)    There is no height or weight on file to calculate BMI.  Performance status (ECOG): 1 - Symptomatic but completely ambulatory  PHYSICAL EXAM:   GENERAL:alert, no distress and comfortable SKIN: skin color, texture, turgor are normal, no rashes or significant lesions EYES: normal, Conjunctiva are pink and non-injected, sclera clear OROPHARYNX:no exudate, no erythema and lips, buccal mucosa, and tongue normal  NECK: supple, thyroid  normal size, non-tender, without nodularity LYMPH:  no palpable lymphadenopathy in the cervical, axillary or inguinal LUNGS: clear to auscultation and percussion with normal breathing effort HEART: regular rate & rhythm and no murmurs and no lower extremity edema ABDOMEN:abdomen soft, non-tender and normal bowel sounds Musculoskeletal:no cyanosis of digits and no clubbing  NEURO: alert & oriented x 3 with fluent speech, no focal motor/sensory deficits  LABORATORY DATA:  I have reviewed the data as listed    Component Value Date/Time   NA 141 12/05/2023 0935   K 3.9 12/05/2023 0935   CL 105 12/05/2023 0935   CO2 30 12/05/2023 0935   GLUCOSE 97 12/05/2023 0935   BUN 10 12/05/2023 0935   CREATININE 0.58 12/05/2023 0935   CALCIUM  9.3 12/05/2023 0935   PROT 7.3 12/05/2023 0935   ALBUMIN 3.6 12/05/2023 0935   AST 13 (L) 12/05/2023 0935   ALT 18 12/05/2023 0935   ALKPHOS 98 12/05/2023 0935   BILITOT 0.3 12/05/2023 0935   GFRNONAA >60 12/05/2023 0935   GFRAA >90 08/28/2014 1950     Lab Results  Component Value Date   WBC 10.2 12/05/2023   NEUTROABS 6.8 12/05/2023   HGB 10.0 (L) 12/05/2023   HCT 31.6 (L) 12/05/2023   MCV 76.0 (L) 12/05/2023   PLT 220 12/05/2023       RADIOGRAPHIC STUDIES: XR Ankle Complete Right Result Date: 12/15/2023 X-rays demonstrate abundant callus formation to the fracture site

## 2023-12-04 NOTE — Assessment & Plan Note (Addendum)
-  Stage IV with oligometastasis, MSS, HER2 (+), UGT1A1 (+), KRAS/NRAS/BRAF wild type -Presented with abdominal pain, change of bowel habits, and a 50 pound weight loss. -Colonoscopy showed malignant obstructive tumor in the distal descending colon, biopsy showed moderate differentiated invasive adenocarcinoma. -CT scan showed a 2.6 cm oligo liver metastasis.  Biopsy is pending -Patient was seen by colorectal surgeon Dr. Hershell Lose on October 10, 2023. -Would recommend neoadjuvant chemotherapy for 3 months, followed by surgery (left hemicolectomy, liver resection), or liver ablation or radiation if she respond well to chemo  -She started chemo FOLFOXIRI on 10/24/2023 -NGS Tempus results showed Tempus negative for KRAS, NRAS, and BRAF mutations, indicating potential benefit from EGFR antibody therapy. HER2 positive, but antibody treatment for this is reserved for future use if progression occurs.  --Vectibix  added with Cycle 4 FOLFOXIRI.

## 2023-12-05 ENCOUNTER — Inpatient Hospital Stay (HOSPITAL_BASED_OUTPATIENT_CLINIC_OR_DEPARTMENT_OTHER): Admitting: Nurse Practitioner

## 2023-12-05 ENCOUNTER — Ambulatory Visit

## 2023-12-05 ENCOUNTER — Inpatient Hospital Stay: Attending: Nurse Practitioner

## 2023-12-05 ENCOUNTER — Ambulatory Visit: Admitting: Hematology

## 2023-12-05 ENCOUNTER — Other Ambulatory Visit: Payer: Self-pay | Admitting: Nurse Practitioner

## 2023-12-05 ENCOUNTER — Encounter: Payer: Self-pay | Admitting: General Practice

## 2023-12-05 ENCOUNTER — Other Ambulatory Visit

## 2023-12-05 ENCOUNTER — Inpatient Hospital Stay

## 2023-12-05 VITALS — BP 147/83 | HR 81 | Temp 98.3°F | Resp 18 | Wt 243.8 lb

## 2023-12-05 VITALS — BP 147/83 | HR 81 | Temp 98.3°F | Resp 18 | Wt 243.0 lb

## 2023-12-05 DIAGNOSIS — Z5189 Encounter for other specified aftercare: Secondary | ICD-10-CM | POA: Diagnosis not present

## 2023-12-05 DIAGNOSIS — C186 Malignant neoplasm of descending colon: Secondary | ICD-10-CM

## 2023-12-05 DIAGNOSIS — Z5112 Encounter for antineoplastic immunotherapy: Secondary | ICD-10-CM | POA: Diagnosis present

## 2023-12-05 DIAGNOSIS — Z95828 Presence of other vascular implants and grafts: Secondary | ICD-10-CM

## 2023-12-05 DIAGNOSIS — Z79899 Other long term (current) drug therapy: Secondary | ICD-10-CM | POA: Insufficient documentation

## 2023-12-05 DIAGNOSIS — Z5111 Encounter for antineoplastic chemotherapy: Secondary | ICD-10-CM | POA: Diagnosis present

## 2023-12-05 DIAGNOSIS — Z79634 Long term (current) use of topoisomerase inhibitor: Secondary | ICD-10-CM | POA: Insufficient documentation

## 2023-12-05 DIAGNOSIS — Z79631 Long term (current) use of antimetabolite agent: Secondary | ICD-10-CM | POA: Insufficient documentation

## 2023-12-05 DIAGNOSIS — Z79630 Long term (current) use of alkylating agent: Secondary | ICD-10-CM | POA: Diagnosis not present

## 2023-12-05 LAB — CMP (CANCER CENTER ONLY)
ALT: 18 U/L (ref 0–44)
AST: 13 U/L — ABNORMAL LOW (ref 15–41)
Albumin: 3.6 g/dL (ref 3.5–5.0)
Alkaline Phosphatase: 98 U/L (ref 38–126)
Anion gap: 6 (ref 5–15)
BUN: 10 mg/dL (ref 6–20)
CO2: 30 mmol/L (ref 22–32)
Calcium: 9.3 mg/dL (ref 8.9–10.3)
Chloride: 105 mmol/L (ref 98–111)
Creatinine: 0.58 mg/dL (ref 0.44–1.00)
GFR, Estimated: >60 mL/min (ref >60)
Glucose, Bld: 97 mg/dL (ref 70–99)
Potassium: 3.9 mmol/L (ref 3.5–5.1)
Sodium: 141 mmol/L (ref 135–145)
Total Bilirubin: 0.3 mg/dL (ref 0.0–1.2)
Total Protein: 7.3 g/dL (ref 6.5–8.1)

## 2023-12-05 LAB — CBC WITH DIFFERENTIAL (CANCER CENTER ONLY)
Abs Immature Granulocytes: 0.45 10*3/uL — ABNORMAL HIGH (ref 0.00–0.07)
Basophils Absolute: 0.1 10*3/uL (ref 0.0–0.1)
Basophils Relative: 1 %
Eosinophils Absolute: 0.1 10*3/uL (ref 0.0–0.5)
Eosinophils Relative: 1 %
HCT: 31.6 % — ABNORMAL LOW (ref 36.0–46.0)
Hemoglobin: 10 g/dL — ABNORMAL LOW (ref 12.0–15.0)
Immature Granulocytes: 4 %
Lymphocytes Relative: 18 %
Lymphs Abs: 1.8 10*3/uL (ref 0.7–4.0)
MCH: 24 pg — ABNORMAL LOW (ref 26.0–34.0)
MCHC: 31.6 g/dL (ref 30.0–36.0)
MCV: 76 fL — ABNORMAL LOW (ref 80.0–100.0)
Monocytes Absolute: 1 10*3/uL (ref 0.1–1.0)
Monocytes Relative: 10 %
Neutro Abs: 6.8 10*3/uL (ref 1.7–7.7)
Neutrophils Relative %: 66 %
Platelet Count: 220 10*3/uL (ref 150–400)
RBC: 4.16 MIL/uL (ref 3.87–5.11)
RDW: 16.9 % — ABNORMAL HIGH (ref 11.5–15.5)
WBC Count: 10.2 10*3/uL (ref 4.0–10.5)
nRBC: 0 % (ref 0.0–0.2)

## 2023-12-05 LAB — MAGNESIUM: Magnesium: 1.9 mg/dL (ref 1.7–2.4)

## 2023-12-05 LAB — PREGNANCY, URINE: Preg Test, Ur: NEGATIVE

## 2023-12-05 MED ORDER — DEXAMETHASONE SODIUM PHOSPHATE 10 MG/ML IJ SOLN
10.0000 mg | Freq: Once | INTRAMUSCULAR | Status: AC
  Administered 2023-12-05: 10 mg via INTRAVENOUS
  Filled 2023-12-05: qty 1

## 2023-12-05 MED ORDER — SODIUM CHLORIDE 0.9% FLUSH
10.0000 mL | Freq: Once | INTRAVENOUS | Status: AC
  Administered 2023-12-05: 10 mL

## 2023-12-05 MED ORDER — APREPITANT 130 MG/18ML IV EMUL
130.0000 mg | Freq: Once | INTRAVENOUS | Status: AC
  Administered 2023-12-05: 130 mg via INTRAVENOUS
  Filled 2023-12-05: qty 18

## 2023-12-05 MED ORDER — SODIUM CHLORIDE 0.9 % IV SOLN: Freq: Once | INTRAVENOUS | Status: AC

## 2023-12-05 MED ORDER — SODIUM CHLORIDE 0.9 % IV SOLN
150.0000 mg/m2 | Freq: Once | INTRAVENOUS | Status: AC
  Administered 2023-12-05: 340 mg via INTRAVENOUS
  Filled 2023-12-05: qty 15

## 2023-12-05 MED ORDER — DEXTROSE 5 % IV SOLN: INTRAVENOUS | Status: DC

## 2023-12-05 MED ORDER — OXALIPLATIN CHEMO INJECTION 100 MG/20ML
85.0000 mg/m2 | Freq: Once | INTRAVENOUS | Status: AC
  Administered 2023-12-05: 200 mg via INTRAVENOUS
  Filled 2023-12-05: qty 40

## 2023-12-05 MED ORDER — ATROPINE SULFATE 1 MG/ML IV SOLN
0.5000 mg | Freq: Once | INTRAVENOUS | Status: AC | PRN
  Administered 2023-12-05: 0.5 mg via INTRAVENOUS
  Filled 2023-12-05: qty 1

## 2023-12-05 MED ORDER — SODIUM CHLORIDE 0.9 % IV SOLN
3200.0000 mg/m2 | INTRAVENOUS | Status: DC
  Administered 2023-12-05: 7000 mg via INTRAVENOUS
  Filled 2023-12-05: qty 140

## 2023-12-05 MED ORDER — SODIUM CHLORIDE 0.9 % IV SOLN
6.0000 mg/kg | Freq: Once | INTRAVENOUS | Status: AC
  Administered 2023-12-05: 700 mg via INTRAVENOUS
  Filled 2023-12-05: qty 15

## 2023-12-05 MED ORDER — LEUCOVORIN CALCIUM INJECTION 350 MG
400.0000 mg/m2 | Freq: Once | INTRAVENOUS | Status: AC
  Administered 2023-12-05: 900 mg via INTRAVENOUS
  Filled 2023-12-05: qty 45

## 2023-12-05 MED ORDER — PALONOSETRON HCL INJECTION 0.25 MG/5ML
0.2500 mg | Freq: Once | INTRAVENOUS | Status: AC
  Administered 2023-12-05: 0.25 mg via INTRAVENOUS
  Filled 2023-12-05: qty 5

## 2023-12-05 NOTE — Patient Instructions (Addendum)
 [facility] - A DEPT OF [facility]  Discharge Instructions: Thank you for choosing [facility] to provide your oncology and hematology care.   If you have a lab appointment with the Cancer Center, please go directly to the Cancer Center and check in at the registration area.   Wear comfortable clothing and clothing appropriate for easy access to any Portacath or PICC line.   We strive to give you quality time with your provider. You may need to reschedule your appointment if you arrive late (15 or more minutes).  Arriving late affects you and other patients whose appointments are after yours.  Also, if you miss three or more appointments without notifying the office, you may be dismissed from the clinic at the provider's discretion.      For prescription refill requests, have your pharmacy contact our office and allow 72 hours for refills to be completed.    Today you received the following chemotherapy and/or immunotherapy agents: Vectibix, Irinotecan , Oxaliplatin , and Fluorouracil        To help prevent nausea and vomiting after your treatment, we encourage you to take your nausea medication as directed.  BELOW ARE SYMPTOMS THAT SHOULD BE REPORTED IMMEDIATELY: *FEVER GREATER THAN 100.4 F (38 C) OR HIGHER *CHILLS OR SWEATING *NAUSEA AND VOMITING THAT IS NOT CONTROLLED WITH YOUR NAUSEA MEDICATION *UNUSUAL SHORTNESS OF BREATH *UNUSUAL BRUISING OR BLEEDING *URINARY PROBLEMS (pain or burning when urinating, or frequent urination) *BOWEL PROBLEMS (unusual diarrhea, constipation, pain near the anus) TENDERNESS IN MOUTH AND THROAT WITH OR WITHOUT PRESENCE OF ULCERS (sore throat, sores in mouth, or a toothache) UNUSUAL RASH, SWELLING OR PAIN  UNUSUAL VAGINAL DISCHARGE OR ITCHING   Items with * indicate a potential emergency and should be followed up as soon as possible or go to the Emergency Department if any problems should occur.  Please show  the CHEMOTHERAPY ALERT CARD or IMMUNOTHERAPY ALERT CARD at check-in to the Emergency Department and triage nurse.  Should you have questions after your visit or need to cancel or reschedule your appointment, please contact [facility]  Dept: [phone]  and follow the prompts.  Office hours are 8:00 a.m. to 4:30 p.m. Monday - Friday. Please note that voicemails left after 4:00 p.m. may not be returned until the following business day.  We are closed weekends and major holidays. You have access to a nurse at all times for urgent questions. Please call the main number to the clinic Dept: [phone] and follow the prompts.   For any non-urgent questions, you may also contact your provider using MyChart. We now offer e-Visits for anyone 64 and older to request care online for non-urgent symptoms. For details visit [email or web address].   Also download the MyChart app! Go to the app store, search "MyChart", open the app, select [facility], and log in with your MyChart username and password.  Panitumumab Injection What is this medication? PANITUMUMAB (pan i TOOM ue mab) treats colorectal cancer. It works by blocking a protein that causes cancer cells to grow and multiply. This helps to slow or stop the spread of cancer cells. It is a monoclonal antibody. This medicine may be used for other purposes; ask your health care provider or pharmacist if you have questions. COMMON BRAND NAME(S): Vectibix What should I tell my care team before I take this medication? They need to know if you have  any of these conditions: Eye disease Low levels of magnesium  in the blood Lung disease An unusual or allergic reaction to panitumumab, other medications, foods, dyes, or preservatives Pregnant or trying to get pregnant Breast-feeding How should I use this medication? This medication is injected into a vein. It is given by your care team in a hospital or  clinic setting. Talk to your care team about the use of this medication in children. Special care may be needed. Overdosage: If you think you have taken too much of this medicine contact a poison control center or emergency room at once. NOTE: This medicine is only for you. Do not share this medicine with others. What if I miss a dose? Keep appointments for follow-up doses. It is important not to miss your dose. Call your care team if you are unable to keep an appointment. What may interact with this medication? Bevacizumab This list may not describe all possible interactions. Give your health care provider a list of all the medicines, herbs, non-prescription drugs, or dietary supplements you use. Also tell them if you smoke, drink alcohol, or use illegal drugs. Some items may interact with your medicine. What should I watch for while using this medication? Your condition will be monitored carefully while you are receiving this medication. This medication may make you feel generally unwell. This is not uncommon as chemotherapy can affect healthy cells as well as cancer cells. Report any side effects. Continue your course of treatment even though you feel ill unless your care team tells you to stop. This medication can make you more sensitive to the sun. Keep out of the sun while receiving this medication and for 2 months after stopping therapy. If you cannot avoid being in the sun, wear protective clothing and sunscreen. Do not use sun lamps, tanning beds, or tanning booths. Check with your care team if you have severe diarrhea, nausea, and vomiting or if you sweat a lot. The loss of too much body fluid may make it dangerous for you to take this medication. This medication may cause serious skin reactions. They can happen weeks to months after starting the medication. Contact your care team right away if you notice fevers or flu-like symptoms with a rash. The rash may be red or purple and then turn  into blisters or peeling of the skin. You may also notice a red rash with swelling of the face, lips, or lymph nodes in your neck or under your arms. Talk to your care team if you may be pregnant. Serious birth defects can occur if you take this medication during pregnancy and for 2 months after the last dose. Contraception is recommended while taking this medication and for 2 months after the last dose. Your care team can help you find the option that works for you. Do not breastfeed while taking this medication and for 2 months after the last dose. This medication may cause infertility. Talk to your care team if you are concerned about your fertility. What side effects may I notice from receiving this medication? Side effects that you should report to your care team as soon as possible: Allergic reactions--skin rash, itching, hives, swelling of the face, lips, tongue, or throat Dry cough, shortness of breath or trouble breathing Eye pain, redness, irritation, or discharge with blurry or decreased vision Infusion reactions--chest pain, shortness of breath or trouble breathing, feeling faint or lightheaded Low magnesium  level--muscle pain or cramps, unusual weakness or fatigue, fast or irregular heartbeat, tremors Low  potassium level--muscle pain or cramps, unusual weakness or fatigue, fast or irregular heartbeat, constipation Redness, blistering, peeling, or loosening of the skin, including inside the mouth Skin reactions on sun-exposed areas Side effects that usually do not require medical attention (report to your care team if they continue or are bothersome): Change in nail shape, thickness, or color Diarrhea Dry skin Fatigue Nausea Vomiting This list may not describe all possible side effects. Call your doctor for medical advice about side effects. You may report side effects to FDA at 1-800-FDA-1088. Where should I keep my medication? This medication is given in a hospital or clinic. It  will not be stored at home. NOTE: This sheet is a summary. It may not cover all possible information. If you have questions about this medicine, talk to your doctor, pharmacist, or health care provider.  2024 Elsevier/Gold Standard (2021-11-03 00:00:00)

## 2023-12-05 NOTE — Progress Notes (Signed)
 [facility] Spiritual Care Note  Attempted follow-up visit in infusion, but Fraida was asleep. Plan to follow up at a future treatment, and she is aware of ongoing chaplain availability.   [address] [name], [place], [facility] Pager [phone] Voicemail [phone]

## 2023-12-06 ENCOUNTER — Telehealth: Payer: Self-pay

## 2023-12-06 NOTE — Telephone Encounter (Signed)
-----   Message from Nurse Cathren Coaster sent at 12/05/2023  3:56 PM EDT ----- Regarding: First time Vectibix. Pt of Feng. First time Vectibix (also getting Folfoxiri). Pt of Feng. Tolerated infusion well. Please check in with patient when possible, thanks!

## 2023-12-06 NOTE — Telephone Encounter (Signed)
 Ashley [name]  states that she is doing fine. She is eating, drinking, and urinating well. She knows to call the office at [phone] if  she has any questions or concerns.

## 2023-12-07 ENCOUNTER — Encounter: Payer: Self-pay | Admitting: Hematology

## 2023-12-07 ENCOUNTER — Encounter

## 2023-12-07 ENCOUNTER — Inpatient Hospital Stay

## 2023-12-07 VITALS — BP 134/93 | HR 86 | Temp 98.4°F | Resp 18

## 2023-12-07 DIAGNOSIS — Z95828 Presence of other vascular implants and grafts: Secondary | ICD-10-CM

## 2023-12-07 DIAGNOSIS — Z5111 Encounter for antineoplastic chemotherapy: Secondary | ICD-10-CM | POA: Diagnosis not present

## 2023-12-07 DIAGNOSIS — C186 Malignant neoplasm of descending colon: Secondary | ICD-10-CM

## 2023-12-07 MED ORDER — SODIUM CHLORIDE 0.9 % IV SOLN: Freq: Once | INTRAVENOUS | Status: AC

## 2023-12-07 MED ORDER — SODIUM CHLORIDE 0.9% FLUSH
10.0000 mL | INTRAVENOUS | Status: DC | PRN
  Administered 2023-12-07: 10 mL

## 2023-12-07 MED ORDER — HEPARIN SOD (PORK) LOCK FLUSH 100 UNIT/ML IV SOLN
500.0000 [IU] | Freq: Once | INTRAVENOUS | Status: AC | PRN
  Administered 2023-12-07: 500 [IU]

## 2023-12-07 MED ORDER — PEGFILGRASTIM-CBQV 6 MG/0.6ML ~~LOC~~ SOSY
6.0000 mg | PREFILLED_SYRINGE | Freq: Once | SUBCUTANEOUS | Status: AC
  Administered 2023-12-07: 6 mg via SUBCUTANEOUS
  Filled 2023-12-07: qty 0.6

## 2023-12-07 NOTE — Patient Instructions (Signed)

## 2023-12-08 ENCOUNTER — Other Ambulatory Visit

## 2023-12-08 ENCOUNTER — Ambulatory Visit: Admitting: Orthopaedic Surgery

## 2023-12-08 DIAGNOSIS — E010 Iodine-deficiency related diffuse (endemic) goiter: Secondary | ICD-10-CM | POA: Diagnosis not present

## 2023-12-09 LAB — T4, FREE: Free T4: 1.1 ng/dL (ref 0.8–1.8)

## 2023-12-09 LAB — TSH: TSH: 0.66 m[IU]/L

## 2023-12-14 ENCOUNTER — Ambulatory Visit: Payer: Self-pay | Admitting: Internal Medicine

## 2023-12-15 ENCOUNTER — Encounter: Payer: Self-pay | Admitting: Hematology

## 2023-12-15 ENCOUNTER — Other Ambulatory Visit (INDEPENDENT_AMBULATORY_CARE_PROVIDER_SITE_OTHER): Payer: Self-pay

## 2023-12-15 ENCOUNTER — Ambulatory Visit: Admitting: Physician Assistant

## 2023-12-15 DIAGNOSIS — M25571 Pain in right ankle and joints of right foot: Secondary | ICD-10-CM

## 2023-12-15 NOTE — Progress Notes (Signed)
 Office Visit Note   Patient: Ashley [name]           Date of Birth: 1986/03/25           MRN: [id number] Visit Date: 12/15/2023              Requested by: Danella Dunn, MD [address] [place],  [place] [address] PCP: Danella Dunn, MD   Assessment & Plan: Visit Diagnoses:  1. Pain in right ankle and joints of right foot     Plan: Impression is 6 weeks status post right lateral malleolus ankle fracture.  At this point, recommended ambulating in her ASO brace.  I sent in a referral for outpatient PT.  Follow-up in 4 to 6 weeks for repeat evaluation and three-view x-rays of the right ankle.  Call with concerns or questions.  Follow-Up Instructions: Return in about 5 weeks (around 01/19/2024).   Orders:  Orders Placed This Encounter  Procedures   XR Ankle Complete Right   Ambulatory referral to Physical Therapy   No orders of the defined types were placed in this encounter.     Procedures: No procedures performed   Clinical Data: No additional findings.   Subjective: Chief Complaint  Patient presents with   Right Ankle - Follow-up    Ankle fracture    HPI patient is a pleasant 38 year old female who comes in today approximately 6 weeks status post right lateral malleolus ankle fracture.  She has been doing well.  Minimal to no pain.  She stopped wearing her cam boot about a week ago.  She occasionally wears the ASO brace.  Review of Systems as detailed in HPI.  All other reviewed and are negative.   Objective: Vital Signs: There were no vitals taken for this visit.  Physical Exam well-developed well-nourished female in no acute distress.  Alert and oriented x 3.  Ortho Exam right ankle exam: Minimal swelling.  Minimal tenderness at the fracture site.  She is neurovascularly intact distally.  Specialty Comments:  No specialty comments available.  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 10/05/2023   Liver mass in setting of colon cancer 10/05/2023   Obesity, Class III, BMI 40-49.9 (morbid obesity) 10/05/2023   Adenocarcinoma of descending colon (HCC) 10/01/2023   Abdominal pain 09/11/2023   Irregular periods/menstrual cycles 01/30/2012   Obesity 11/13/2011   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   Irregular periods/menstrual cycles 01/30/2012   Late prenatal care 01/30/2012   Poor social situation 01/30/2012   Previous cesarean section complicating pregnancy, antepartum condition or complication 01/27/2016   2 previous cesarean section- Desires repeat with BTL   Single liveborn, born in hospital, delivered by cesarean delivery 08/13/2016   Status post repeat low transverse cesarean section 08/14/2016   Status post tubal ligation at time of delivery, current hosp 08/14/2016    Family History  Problem Relation Age of Onset   Asthma Mother    Hypertension Mother    Early death Mother  AGE 68   Colon cancer Maternal Grandmother 60       late 50s-early 60s   Liver disease Neg Hx    Esophageal cancer Neg Hx     Past Surgical History:  Procedure Laterality Date   CESAREAN SECTION  2009   CESAREAN SECTION  01/30/2012   Procedure: CESAREAN SECTION;  Surgeon: Norville Beery, MD;  Location: [facility];  Service: Gynecology;  Laterality: N/A;  Repeat C/S   CESAREAN SECTION WITH BILATERAL TUBAL LIGATION Bilateral 08/13/2016   Procedure: CESAREAN SECTION WITH BILATERAL TUBAL LIGATION;  Surgeon: Rik Chasten, MD;  Location: [facility] BIRTHING SUITES;  Service: Obstetrics;   Laterality: Bilateral;   CHOLECYSTECTOMY N/A 10/16/2013   Procedure: LAPAROSCOPIC CHOLECYSTECTOMY WITH INTRAOPERATIVE CHOLANGIOGRAM;  Surgeon: Kari Otto. Eli Grizzle, MD;  Location: [facility];  Service: General;  Laterality: N/A;   COLONOSCOPY WITH PROPOFOL  N/A 09/11/2023   Procedure: COLONOSCOPY WITH PROPOFOL ;  Surgeon: Lajuan Pila, MD;  Location: [facility];  Service: Gastroenterology;  Laterality: N/A;   DILATION AND EVACUATION N/A 07/28/2015   Procedure: SUCTION, DILATATION AND EVACUATION;  Surgeon: Gabrielle Joiner, MD;  Location: [facility];  Service: Gynecology;  Laterality: N/A;   ESOPHAGOGASTRODUODENOSCOPY (EGD) WITH PROPOFOL  N/A 09/11/2023   Procedure: ESOPHAGOGASTRODUODENOSCOPY (EGD) WITH PROPOFOL ;  Surgeon: Lajuan Pila, MD;  [facility]: [facility];  Service: Gastroenterology;  Laterality: N/A;   IR IMAGING GUIDED PORT INSERTION  10/20/2023   IR US  LIVER BIOPSY  10/20/2023   SUBMUCOSAL INJECTION  09/11/2023   Procedure: INJECTION, SUBMUCOSAL;  Surgeon: Lajuan Pila, MD;  [facility]: [facility];  Service: Gastroenterology;;   TONSILLECTOMY  AGE 81   TONSILLECTOMY AND ADENOIDECTOMY  AGEB 18   Social History   Occupational History   Occupation: Patent examiner  Tobacco Use   Smoking status: Former    Current packs/day: 0.00    Types: Cigarettes    Quit date: 10/31/2009    Years since quitting: 14.1   Smokeless tobacco: Never  Vaping Use   Vaping status: Every Day  Substance and Sexual Activity   Alcohol use: No    Alcohol/week: 0.0 standard drinks of alcohol   Drug use: No   Sexual activity: Yes    Partners: Male    Birth control/protection: None, Surgical    Comment: BTL 08/2016

## 2023-12-17 ENCOUNTER — Encounter: Payer: Self-pay | Admitting: Nurse Practitioner

## 2023-12-17 ENCOUNTER — Encounter: Payer: Self-pay | Admitting: Hematology

## 2023-12-17 NOTE — Progress Notes (Unsigned)
 [facility]   Telephone:(336) [phone] [phone]    Patient Care Team: Danella Dunn, MD as PCP - General (Family Medicine) Gerald Kitty, Isiah Mare, RN as Oncology Nurse Navigator Lajuan Pila, MD as Consulting Physician (Gastroenterology) Sonja Driscoll, MD as Consulting Physician (Medical Oncology) Gabrielle Joiner, MD as Consulting Physician (Obstetrics and Gynecology) Candyce Champagne, MD as Consulting Physician (General Surgery) Lujean Sake, MD as Consulting Physician (General Surgery)   CHIEF COMPLAINT: Follow up colon cancer   Oncology History  Adenocarcinoma of descending colon [facility])  09/11/2023 Cancer Staging   Staging form: Colon and Rectum, AJCC 8th Edition - Clinical stage from 09/11/2023: Stage IVA (cTX, cN1, cM1a) - Signed by Sonja [place], MD on 10/12/2023   10/01/2023 Initial Diagnosis   Adenocarcinoma of descending colon (HCC)   10/24/2023 -  Chemotherapy   Patient is on Treatment Plan : COLORECTAL FOLFOXIRI q14d      Genetic Testing   Negative CancerNext +RNAinsight. The [facility] CancerNext+RNAinsight Panel includes sequencing, rearrangement analysis, and RNA analysis for the following 39 genes: APC, ATM, BAP1, BARD1, BMPR1A, BRCA1, BRCA2, BRIP1, CDH1, CDKN2A, CHEK2, FH, FLCN, MET, MLH1, MSH2, MSH6, MUTYH, NF1, NTHL1, PALB2, PMS2, PTEN, RAD51C, RAD51D, SMAD4, STK11, TP53, TSC1, TSC2, and VHL (sequencing and deletion/duplication); AXIN2, HOXB13, MBD4, MSH3, POLD1 and POLE (sequencing only); EPCAM and GREM1 (deletion/duplication only). Report date 11/04/23.       CURRENT THERAPY: FOLFOXIRI q14 days, starting 10/24/23; panitumumab  added with cycle 4  INTERVAL HISTORY Ms. Niccoli returns for follow up and treatment, seen in infusion room. Nausea was better last cycle, she feeds it by eating bland foods and having something on her stomach. No vomiting. Bowels moving normally. Did not take oral K for the past 2 days. Skin rash is bothersome, has not tried  anything for it. Had a sore throat last week that resolved, no fever/cough/chills, dyspnea, or other signs of infection. Denies pain or other concerns.     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   Irregular periods/menstrual cycles 01/30/2012   Late prenatal care 01/30/2012   Poor social situation 01/30/2012   Previous cesarean section complicating pregnancy, antepartum condition or complication 01/27/2016   2 previous cesarean section- Desires repeat with BTL   Single liveborn, born in hospital, delivered by cesarean delivery 08/13/2016   Status post repeat low transverse cesarean section 08/14/2016   Status post tubal ligation at time of delivery, current hosp 08/14/2016     Past Surgical History:  Procedure Laterality Date   CESAREAN SECTION  2009   CESAREAN SECTION  01/30/2012   Procedure: CESAREAN SECTION;  Surgeon: Norville Beery, MD;  Location: [facility];  Service: Gynecology;  Laterality: N/A;  Repeat C/S   CESAREAN SECTION WITH BILATERAL TUBAL LIGATION Bilateral 08/13/2016   Procedure: CESAREAN SECTION WITH BILATERAL TUBAL LIGATION;  Surgeon: Rik Chasten, MD;  Location: [facility]TH[facility] SUIT[facility];  Service: Obstetrics;  Laterality: Bilateral;   CHOLECYSTECTOMY N/A 10/16/2013   Procedure: LAPAROSCOPIC CHOLECYSTECTOMY WITH INTRAOPERATIVE CHOLANGIOGRAM;  Surgeon: Kari Otto. Eli Grizzle, MD;  Location: [facility];  Service: General;  Laterality: N/A;   COLONOSCOPY WITH PROPOFOL  N/A 09/11/2023   Procedure: COLONOSCOPY WITH PROPOFOL ;  Surgeon: Lajuan Pila, MD;  [facility]: [facility];  Service: Gastroenterology;  Laterality: N/A;  DILATION AND EVACUATION N/A 07/28/2015   Procedure: SUCTION, DILATATION AND EVACUATION;  Surgeon: Gabrielle Joiner, MD;  Location: [facility];  Service: Gynecology;  Laterality: N/A;    ESOPHAGOGASTRODUODENOSCOPY (EGD) WITH PROPOFOL  N/A 09/11/2023   Procedure: ESOPHAGOGASTRODUODENOSCOPY (EGD) WITH PROPOFOL ;  Surgeon: Lajuan Pila, MD;  [facility];  Service: Gastroenterology;  Laterality: N/A;   IR IMAGING GUIDED PORT INSERTION  10/20/2023   IR US  LIVER BIOPSY  10/20/2023   SUBMUCOSAL INJECTION  09/11/2023   Procedure: INJECTION, SUBMUCOSAL;  Surgeon: Lajuan Pila, MD;  [facility];  Service: Gastroenterology;;   TONSILLECTOMY  AGE 56   TONSILLECTOMY AND ADENOIDECTOMY  AGEB 18     Outpatient Encounter Medications as of 12/18/2023  Medication Sig   clindamycin (CLINDAGEL) 1 % gel Apply topically 2 (two) times daily.   amLODipine (NORVASC) 10 MG tablet Take 10 mg by mouth daily.   hydrochlorothiazide (HYDRODIURIL) 25 MG tablet Take 25 mg by mouth daily.   hyoscyamine  (LEVSIN  SL) 0.125 MG SL tablet Place 1 tablet (0.125 mg total) under the tongue every 6 (six) hours as needed for cramping (nausea, diarrhea).   ibuprofen  (ADVIL ) 800 MG tablet Take 800 mg by mouth 3 (three) times daily.   lidocaine -prilocaine  (EMLA ) cream Apply to affected area once   loratadine  (CLARITIN ) 10 MG tablet Take 1 tablet (10 mg total) by mouth daily.   meloxicam (MOBIC) 15 MG tablet Take 15 mg by mouth daily.   omeprazole  (PRILOSEC  OTC) 20 MG tablet Take 1 tablet (20 mg total) by mouth daily.   ondansetron  (ZOFRAN ) 8 MG tablet Take 1 tablet (8 mg total) by mouth every 8 (eight) hours as needed for nausea or vomiting. Start on the third day after chemotherapy   ondansetron  (ZOFRAN -ODT) 4 MG disintegrating tablet Take 1 tablet (4 mg total) by mouth every 8 (eight) hours as needed for nausea or vomiting.   potassium chloride  SA (KLOR-CON  M) 20 MEQ tablet TAKE 1 TABLET DAILY. PLEASE FOLLOW UP WITH YOUR PRIMARY CARE DOCTOR REGARDING ADDITIONAL REFILLS.   prochlorperazine  (COMPAZINE ) 10 MG tablet Take 1 tablet (10 mg total) by mouth every 6 (six) hours as needed for nausea or vomiting  (Nausea or vomiting).   Facility-Administered Encounter Medications as of 12/18/2023  Medication   [COMPLETED] aprepitant  (CINVANTI ) injection 130 mg   [COMPLETED] atropine  injection 0.5 mg   [COMPLETED] dexamethasone  (DECADRON ) injection 10 mg   dextrose  5 % solution   fluorouracil  (ADRUCIL ) 7,000 mg in sodium chloride  0.9 % 110 mL chemo infusion   irinotecan  (CAMPTOSAR ) 340 mg in sodium chloride  0.9 % 500 mL chemo infusion   leucovorin  900 mg in dextrose  5 % 250 mL infusion   oxaliplatin  (ELOXATIN ) 200 mg in dextrose  5 % 500 mL chemo infusion   [COMPLETED] panitumumab  (VECTIBIX ) 700 mg in sodium chloride  0.9 % 100 mL chemo infusion     There were no vitals filed for this visit. There is no height or weight on file to calculate BMI.   ECOG PERFORMANCE STATUS: 1 - Symptomatic but completely ambulatory  PHYSICAL EXAM GENERAL:alert, no distress and comfortable SKIN: acneform rash for forehead/face and mild on chest/upper back ORAL: no thrush or ulcers  EYES: sclera clear NECK: without mass LYMPH:  no palpable cervical or supraclavicular lymphadenopathy  LUNGS: clear with normal breathing effort HEART: regular rate & rhythm, no lower extremity edema ABDOMEN: abdomen soft, non-tender and normal bowel sounds NEURO: alert & oriented x 3 with fluent speech, no focal motor/sensory deficits PAC without erythema  CBC    Latest Ref Rng & Units 12/18/2023    9:05 AM 12/05/2023    9:35 AM 11/21/2023    7:54 AM  CBC  WBC 4.0 - 10.5 K/uL 5.8  10.2  5.5   Hemoglobin 12.0 - 15.0 g/dL 54.2  70.6  9.6   Hematocrit 36.0 - 46.0 % 31.9  31.6  30.2   Platelets 150 - 400 K/uL 122  220  200       CMP     Latest Ref Rng & Units 12/18/2023    9:05 AM 12/05/2023    9:35 AM 11/21/2023    7:54 AM  CMP  Glucose 70 - 99 mg/dL 237  97  99   BUN 6 - 20 mg/dL 5  10  8    Creatinine 0.44 - 1.00 mg/dL 6.28  3.15  1.76   Sodium 135 - 145 mmol/L 141  141  141   Potassium 3.5 - 5.1 mmol/L 3.2  3.9  3.4    Chloride 98 - 111 mmol/L 103  105  105   CO2 22 - 32 mmol/L 31  30  30    Calcium  8.9 - 10.3 mg/dL 8.9  9.3  8.7   Total Protein 6.5 - 8.1 g/dL 6.9  7.3  6.7   Total Bilirubin 0.0 - 1.2 mg/dL 0.3  0.3  0.2   Alkaline Phos 38 - 126 U/L 99  98  93   AST 15 - 41 U/L 15  13  10    ALT 0 - 44 U/L 20  18  12        ASSESSMENT & PLAN: 38 yo female    Adenocarcinoma of descending colon, with oligo liver metastasis, stage IV; MMR normal  -Presented with abdominal pain, change of bowel habits, and a 50 pound weight loss. -Colonoscopy showed malignant obstructive tumor in the distal descending colon, biopsy showed moderate differentiated invasive adenocarcinoma. -CT scan showed a 2.6 cm liver mass, c/w oligo metastasis on MRI -Seen by colorectal surgeon Dr. Hershell Lose on 10/10/23. -Would recommend neoadjuvant chemotherapy for 3 months, followed by surgery (left hemicolectomy, liver resection), or liver ablation or radiation if she responds well to chemo  -liver biopsy confirms the presence of metastatic adenocarcinoma, compatible with colorectal primary. Ms. Gionfriddo understands metastatic colon cancer, low disease burden; the goal remains curative with aggressive treatment and multimodal approach -Began C1 FOLFOXIRI 4/22, tolerated well overall with mild nausea and somewhat loose stools (likely related to miralax  and tea, we reviewed symptom management) -She developed diaphoresis and fell after GCSF, had not eaten prior, with subsequent minimally displaced ankle fracture. In a boot and recovering well. -based on [facility] results, Panitumumab  added with cycle 4 on 6/3 -Ms. Barbone appears stable, s/p cycle 4, tolerating well with mild nausea and vectibix  rash. SE's are managed with supportive care at home. I reviewed symptom management for rash including alternating hydrocortisone and clindagel (sent Rx). She prefers to hold doxy for now -Able to recover and function with good PS. No clinical evidence of  progression -Labs reviewed, she will restart oral K once daily.  -Proceed with cycle 5 FOLFOXIRI and vectibix  today, no dose modifications -Notably, she has UGT1A1 genotype = poor metab for Irinotecan . No dose reduction needed at this time -F/up and cycle 6 in 2 weeks        PLAN: -Labs reviewed -Restart oral K po once daily -Proceed with cycle 5 FOLFOXIRI and vectibix , no dose adjustments, then gcsf -Reviewed symptom management for vectibix   rash, Rx clindagel (she preferred to hold doxy for now) -Restaging CT 6/24 as scheduled -F/up and cycle 6 in 2 weeks   All questions were answered. The patient knows to call the clinic with any problems, questions or concerns. No barriers to learning were detected.  Kenny Rea K Amela Handley, NP 12/18/2023

## 2023-12-18 ENCOUNTER — Inpatient Hospital Stay

## 2023-12-18 ENCOUNTER — Other Ambulatory Visit: Payer: Self-pay | Admitting: Nurse Practitioner

## 2023-12-18 ENCOUNTER — Inpatient Hospital Stay (HOSPITAL_BASED_OUTPATIENT_CLINIC_OR_DEPARTMENT_OTHER): Admitting: Nurse Practitioner

## 2023-12-18 ENCOUNTER — Other Ambulatory Visit: Payer: Self-pay

## 2023-12-18 ENCOUNTER — Encounter: Payer: Self-pay | Admitting: Hematology

## 2023-12-18 ENCOUNTER — Encounter: Payer: Self-pay | Admitting: Nurse Practitioner

## 2023-12-18 VITALS — BP 108/90 | HR 92 | Temp 98.5°F | Resp 13

## 2023-12-18 DIAGNOSIS — C186 Malignant neoplasm of descending colon: Secondary | ICD-10-CM

## 2023-12-18 DIAGNOSIS — Z5111 Encounter for antineoplastic chemotherapy: Secondary | ICD-10-CM | POA: Diagnosis not present

## 2023-12-18 DIAGNOSIS — Z95828 Presence of other vascular implants and grafts: Secondary | ICD-10-CM

## 2023-12-18 LAB — CBC WITH DIFFERENTIAL (CANCER CENTER ONLY)
Abs Immature Granulocytes: 0.06 10*3/uL (ref 0.00–0.07)
Basophils Absolute: 0 10*3/uL (ref 0.0–0.1)
Basophils Relative: 1 %
Eosinophils Absolute: 0.4 10*3/uL (ref 0.0–0.5)
Eosinophils Relative: 6 %
HCT: 31.9 % — ABNORMAL LOW (ref 36.0–46.0)
Hemoglobin: 10.1 g/dL — ABNORMAL LOW (ref 12.0–15.0)
Immature Granulocytes: 1 %
Lymphocytes Relative: 27 %
Lymphs Abs: 1.6 10*3/uL (ref 0.7–4.0)
MCH: 24 pg — ABNORMAL LOW (ref 26.0–34.0)
MCHC: 31.7 g/dL (ref 30.0–36.0)
MCV: 75.8 fL — ABNORMAL LOW (ref 80.0–100.0)
Monocytes Absolute: 0.6 10*3/uL (ref 0.1–1.0)
Monocytes Relative: 11 %
Neutro Abs: 3.1 10*3/uL (ref 1.7–7.7)
Neutrophils Relative %: 54 %
Platelet Count: 122 10*3/uL — ABNORMAL LOW (ref 150–400)
RBC: 4.21 MIL/uL (ref 3.87–5.11)
RDW: 16.8 % — ABNORMAL HIGH (ref 11.5–15.5)
WBC Count: 5.8 10*3/uL (ref 4.0–10.5)
nRBC: 0 % (ref 0.0–0.2)

## 2023-12-18 LAB — CMP (CANCER CENTER ONLY)
ALT: 20 U/L (ref 0–44)
AST: 15 U/L (ref 15–41)
Albumin: 3.4 g/dL — ABNORMAL LOW (ref 3.5–5.0)
Alkaline Phosphatase: 99 U/L (ref 38–126)
Anion gap: 7 (ref 5–15)
BUN: <5 mg/dL — ABNORMAL LOW (ref 6–20)
CO2: 31 mmol/L (ref 22–32)
Calcium: 8.9 mg/dL (ref 8.9–10.3)
Chloride: 103 mmol/L (ref 98–111)
Creatinine: 0.49 mg/dL (ref 0.44–1.00)
GFR, Estimated: >60 mL/min (ref >60)
Glucose, Bld: 108 mg/dL — ABNORMAL HIGH (ref 70–99)
Potassium: 3.2 mmol/L — ABNORMAL LOW (ref 3.5–5.1)
Sodium: 141 mmol/L (ref 135–145)
Total Bilirubin: 0.3 mg/dL (ref 0.0–1.2)
Total Protein: 6.9 g/dL (ref 6.5–8.1)

## 2023-12-18 LAB — MAGNESIUM: Magnesium: 1.7 mg/dL (ref 1.7–2.4)

## 2023-12-18 LAB — PREGNANCY, URINE: Preg Test, Ur: NEGATIVE

## 2023-12-18 MED ORDER — CLINDAMYCIN PHOS (TWICE-DAILY) 1 % EX GEL: Freq: Two times a day (BID) | CUTANEOUS | 2 refills | Status: DC

## 2023-12-18 MED ORDER — LEUCOVORIN CALCIUM INJECTION 350 MG
400.0000 mg/m2 | Freq: Once | INTRAVENOUS | Status: AC
  Filled 2023-12-18: qty 45

## 2023-12-18 MED ORDER — PALONOSETRON HCL INJECTION 0.25 MG/5ML
0.2500 mg | Freq: Once | INTRAVENOUS | Status: AC
  Filled 2023-12-18: qty 5

## 2023-12-18 MED ORDER — APREPITANT 130 MG/18ML IV EMUL
130.0000 mg | Freq: Once | INTRAVENOUS | Status: AC
  Filled 2023-12-18: qty 18

## 2023-12-18 MED ORDER — OXALIPLATIN CHEMO INJECTION 100 MG/20ML
85.0000 mg/m2 | Freq: Once | INTRAVENOUS | Status: AC
  Administered 2023-12-18: 200 mg via INTRAVENOUS
  Filled 2023-12-18: qty 40

## 2023-12-18 MED ORDER — ATROPINE SULFATE 1 MG/ML IV SOLN
0.5000 mg | Freq: Once | INTRAVENOUS | Status: AC | PRN
  Filled 2023-12-18: qty 1

## 2023-12-18 MED ORDER — SODIUM CHLORIDE 0.9 % IV SOLN
6.0000 mg/kg | Freq: Once | INTRAVENOUS | Status: AC
  Filled 2023-12-18: qty 15

## 2023-12-18 MED ORDER — DEXAMETHASONE SODIUM PHOSPHATE 10 MG/ML IJ SOLN
10.0000 mg | Freq: Once | INTRAMUSCULAR | Status: AC
  Filled 2023-12-18: qty 1

## 2023-12-18 MED ORDER — SODIUM CHLORIDE 0.9 % IV SOLN: Freq: Once | INTRAVENOUS | Status: AC

## 2023-12-18 MED ORDER — PROCHLORPERAZINE EDISYLATE 10 MG/2ML IJ SOLN
10.0000 mg | Freq: Once | INTRAMUSCULAR | Status: AC
  Filled 2023-12-18: qty 2

## 2023-12-18 MED ORDER — SODIUM CHLORIDE 0.9 % IV SOLN
3200.0000 mg/m2 | INTRAVENOUS | Status: DC
  Filled 2023-12-18: qty 140

## 2023-12-18 MED ORDER — SODIUM CHLORIDE 0.9% FLUSH
10.0000 mL | Freq: Once | INTRAVENOUS | Status: AC
  Administered 2023-12-18: 10 mL

## 2023-12-18 MED ORDER — SODIUM CHLORIDE 0.9 % IV SOLN
150.0000 mg/m2 | Freq: Once | INTRAVENOUS | Status: AC
  Filled 2023-12-18: qty 15

## 2023-12-18 MED ORDER — DEXTROSE 5 % IV SOLN: INTRAVENOUS | Status: DC

## 2023-12-18 NOTE — Progress Notes (Signed)
 Pt. Here for port flush/labs and infusion.  Pt. States she needs Pregnancy Urine test.  Order states every 21 days. Testing was early.  Sent secure chat to Lacie, Shelbra, and Kiandra.  Kiandra added [employer].  Heather states pt. Is to have test done every treatment

## 2023-12-18 NOTE — Patient Instructions (Signed)
 [facility] - A DEPT OF [facility]  Discharge Instructions: Thank you for choosing [facility] to provide your oncology and hematology care.   If you have a lab appointment with the Cancer Center, please go directly to the Cancer Center and check in at the registration area.   Wear comfortable clothing and clothing appropriate for easy access to any Portacath or PICC line.   We strive to give you quality time with your provider. You may need to reschedule your appointment if you arrive late (15 or more minutes).  Arriving late affects you and other patients whose appointments are after yours.  Also, if you miss three or more appointments without notifying the office, you may be dismissed from the clinic at the provider's discretion.      For prescription refill requests, have your pharmacy contact our office and allow 72 hours for refills to be completed.    Today you received the following chemotherapy and/or immunotherapy agents: Vectibix /Irinotecan /Oxaliplatin /Leucovorin /5FU      To help prevent nausea and vomiting after your treatment, we encourage you to take your nausea medication as directed.  BELOW ARE SYMPTOMS THAT SHOULD BE REPORTED IMMEDIATELY: *FEVER GREATER THAN 100.4 F (38 C) OR HIGHER *CHILLS OR SWEATING *NAUSEA AND VOMITING THAT IS NOT CONTROLLED WITH YOUR NAUSEA MEDICATION *UNUSUAL SHORTNESS OF BREATH *UNUSUAL BRUISING OR BLEEDING *URINARY PROBLEMS (pain or burning when urinating, or frequent urination) *BOWEL PROBLEMS (unusual diarrhea, constipation, pain near the anus) TENDERNESS IN MOUTH AND THROAT WITH OR WITHOUT PRESENCE OF ULCERS (sore throat, sores in mouth, or a toothache) UNUSUAL RASH, SWELLING OR PAIN  UNUSUAL VAGINAL DISCHARGE OR ITCHING   Items with * indicate a potential emergency and should be followed up as soon as possible or go to the Emergency Department if any problems should occur.  Please show the  CHEMOTHERAPY ALERT CARD or IMMUNOTHERAPY ALERT CARD at check-in to the Emergency Department and triage nurse.  Should you have questions after your visit or need to cancel or reschedule your appointment, please contact [facility] - A DEPT OF [facility]  Dept: [phone]  and follow the prompts.  Office hours are 8:00 a.m. to 4:30 p.m. Monday - Friday. Please note that voicemails left after 4:00 p.m. may not be returned until the following business day.  We are closed weekends and major holidays. You have access to a nurse at all times for urgent questions. Please call the main number to the clinic Dept: [phone] and follow the prompts.   For any non-urgent questions, you may also contact your provider using MyChart. We now offer e-Visits for anyone 9 and older to request care online for non-urgent symptoms. For details visit [email or web address].   Also download the MyChart app! Go to the app store, search MyChart, open the app, select [facility], and log in with your MyChart username and password.

## 2023-12-20 ENCOUNTER — Inpatient Hospital Stay

## 2023-12-20 DIAGNOSIS — C186 Malignant neoplasm of descending colon: Secondary | ICD-10-CM

## 2023-12-20 DIAGNOSIS — Z5111 Encounter for antineoplastic chemotherapy: Secondary | ICD-10-CM | POA: Diagnosis not present

## 2023-12-20 MED ORDER — PEGFILGRASTIM-CBQV 6 MG/0.6ML ~~LOC~~ SOSY
6.0000 mg | PREFILLED_SYRINGE | Freq: Once | SUBCUTANEOUS | Status: AC
  Administered 2023-12-20: 6 mg via SUBCUTANEOUS

## 2023-12-20 MED ORDER — HEPARIN SOD (PORK) LOCK FLUSH 100 UNIT/ML IV SOLN
500.0000 [IU] | Freq: Once | INTRAVENOUS | Status: AC | PRN
  Administered 2023-12-20: 500 [IU]

## 2023-12-20 MED ORDER — SODIUM CHLORIDE 0.9% FLUSH
10.0000 mL | INTRAVENOUS | Status: DC | PRN
  Administered 2023-12-20: 10 mL

## 2023-12-22 ENCOUNTER — Encounter: Payer: Self-pay | Admitting: Hematology

## 2023-12-24 DIAGNOSIS — C186 Malignant neoplasm of descending colon: Secondary | ICD-10-CM | POA: Diagnosis not present

## 2023-12-26 ENCOUNTER — Ambulatory Visit (HOSPITAL_COMMUNITY)
Admission: RE | Admit: 2023-12-26 | Discharge: 2023-12-26 | Disposition: A | Discharge status: Home / Self Care | Source: Ambulatory Visit | Attending: Hematology | Admitting: Hematology

## 2023-12-26 ENCOUNTER — Encounter (HOSPITAL_COMMUNITY): Payer: Self-pay

## 2023-12-26 DIAGNOSIS — C186 Malignant neoplasm of descending colon: Secondary | ICD-10-CM | POA: Insufficient documentation

## 2023-12-26 MED ORDER — HEPARIN SOD (PORK) LOCK FLUSH 100 UNIT/ML IV SOLN
500.0000 [IU] | Freq: Once | INTRAVENOUS | Status: AC
  Administered 2023-12-26: 500 [IU] via INTRAVENOUS

## 2023-12-26 MED ORDER — SODIUM CHLORIDE (PF) 0.9 % IJ SOLN
INTRAMUSCULAR | Status: AC
  Filled 2023-12-26: qty 50

## 2023-12-26 MED ORDER — IOHEXOL 9 MG/ML PO SOLN
1000.0000 mL | ORAL | Status: AC
  Administered 2023-12-26: 1000 mL via ORAL

## 2023-12-26 MED ORDER — HEPARIN SOD (PORK) LOCK FLUSH 100 UNIT/ML IV SOLN
INTRAVENOUS | Status: AC
  Filled 2023-12-26: qty 5

## 2023-12-26 MED ORDER — IOHEXOL 300 MG/ML  SOLN
100.0000 mL | Freq: Once | INTRAMUSCULAR | Status: AC | PRN
  Administered 2023-12-26: 100 mL via INTRAVENOUS

## 2023-12-26 MED ORDER — IOHEXOL 9 MG/ML PO SOLN
ORAL | Status: AC
Start: 2023-12-26 — End: 2023-12-26
  Filled 2023-12-26: qty 1000

## 2023-12-28 NOTE — Therapy (Signed)
 OUTPATIENT PHYSICAL THERAPY LOWER EXTREMITY EVALUATION/Discharge   Patient Name: Ashley [name] MRN: [id number] DOB:May 10, 1986, 38 y.o., female Today's Date: 12/29/2023  END OF SESSION:  PT End of Session - 12/29/23 1321     Visit Number 1    Number of Visits 1    Authorization Type [place] MEDICAID [employer] OF     PT Start Time 0930    PT Stop Time 1010    PT Time Calculation (min) 40 min    Activity Tolerance Patient tolerated treatment well    Behavior During Therapy [facility] for tasks assessed/performed          Past Medical History:  Diagnosis Date   colon ca 10/2023   GERD (gastroesophageal reflux disease) 2012   diet controlled - no meds   Headache(784.0)    3X WEEK   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   Irregular periods/menstrual cycles 01/30/2012   Late prenatal care 01/30/2012   Poor social situation 01/30/2012   Previous cesarean section complicating pregnancy, antepartum condition or complication 01/27/2016   2 previous cesarean section- Desires repeat with BTL   Single liveborn, born in hospital, delivered by cesarean delivery 08/13/2016   Status post repeat low transverse cesarean section 08/14/2016   Status post tubal ligation at time of delivery, current hosp 08/14/2016   Past Surgical History:  Procedure Laterality Date   CESAREAN SECTION  2009   CESAREAN SECTION  01/30/2012   Procedure: CESAREAN SECTION;  Surgeon: Ovid DELENA All, MD;  Location: [facility];  Service: Gynecology;  Laterality: N/A;  Repeat C/S   CESAREAN SECTION WITH BILATERAL TUBAL LIGATION Bilateral 08/13/2016   Procedure: CESAREAN SECTION WITH BILATERAL TUBAL LIGATION;  Surgeon: Burnard VEAR Pate, MD;  Location: [facility];  Service: Obstetrics;  Laterality: Bilateral;   CHOLECYSTECTOMY N/A 10/16/2013   Procedure: LAPAROSCOPIC CHOLECYSTECTOMY WITH INTRAOPERATIVE CHOLANGIOGRAM;  Surgeon: Donnice POUR. Belinda, MD;  Location: [facility];  Service: General;  Laterality: N/A;   COLONOSCOPY WITH  PROPOFOL  N/A 09/11/2023   Procedure: COLONOSCOPY WITH PROPOFOL ;  Surgeon: [name], MD;  Location: [facility];  Service: Gastroenterology;  Laterality: N/A;   DILATION AND EVACUATION N/A 07/28/2015   Procedure: SUCTION, DILATATION AND EVACUATION;  Surgeon: Carlin DELENA Centers, MD;  Location: [facility];  Service: Gynecology;  Laterality: N/A;   ESOPHAGOGASTRODUODENOSCOPY (EGD) WITH PROPOFOL  N/A 09/11/2023   Procedure: ESOPHAGOGASTRODUODENOSCOPY (EGD) WITH PROPOFOL ;  Surgeon: [name], MD;  Location: [facility];  Service: Gastroenterology;  Laterality: N/A;   IR IMAGING GUIDED PORT INSERTION  10/20/2023   IR US  LIVER BIOPSY  10/20/2023   SUBMUCOSAL INJECTION  09/11/2023   Procedure: INJECTION, SUBMUCOSAL;  Surgeon: [name], MD;  Location: [facility];  Service: Gastroenterology;;   TONSILLECTOMY  AGE 57   TONSILLECTOMY AND ADENOIDECTOMY  AGEB 18   Patient Active Problem List   Diagnosis Date Noted   Genetic testing 11/10/2023   Port-A-Cath in place 10/24/2023   Subclinical hyperthyroidism 10/09/2023   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 10/05/2023   Liver mass in setting of colon cancer 10/05/2023   Obesity, Class III, BMI 40-49.9 (morbid obesity) 10/05/2023   Adenocarcinoma of descending colon (HCC) 10/01/2023   Abdominal pain 09/11/2023   Irregular periods/menstrual cycles 01/30/2012   Obesity 11/13/2011    PCP: [name], MD   REFERRING PROVIDER: Jule Ronal CROME, PA-C   REFERRING DIAG: [id number] (ICD-10-CM) - Pain in right ankle and joints of right foot   THERAPY DIAG:  Pain in  right ankle and joints of right foot  Rationale for Evaluation and Treatment: Rehabilitation  ONSET DATE: 10/27/23  SUBJECTIVE:   SUBJECTIVE STATEMENT: Pt reports she injured her R ankle when she passed out following a chemo infusion. Pt reports her R ankle is doing very well now with no pain except with prolonged standing or walking where it will  become a little sore. Pt states she is completing all daily activities as she wants too at this time.  PERTINENT HISTORY: Plan: Impression is 6 weeks status post right lateral malleolus ankle fracture.  At this point, recommended ambulating in her ASO brace.  I sent in a referral for outpatient PT.  Follow-up in 4 to 6 weeks for repeat evaluation and three-view x-rays of the right ankle.  Call with concerns or questions.    PAIN:  Are you having pain? Yes: NPRS scale: 01/0 Pain location: Lateral r ankle Pain description: sore Aggravating factors: prolonged walking Relieving factors: rest  PRECAUTIONS: None  RED FLAGS: None   WEIGHT BEARING RESTRICTIONS: No  FALLS:  Has patient fallen in last 6 months? Yes. Number of falls 1 Related to chemo injection  LIVING ENVIRONMENT: Lives with: lives with their family Lives in: House/apartment Able to access home  OCCUPATION: not working  PLOF: Independent  PATIENT GOALS: Pt is pleased with her progress since her R ankle injury  NEXT MD VISIT: None scheduled  OBJECTIVE:  Note: Objective measures were completed at Evaluation unless otherwise noted.  DIAGNOSTIC FINDINGS: XR Ankle Complete Right Result Date: 12/15/2023 X-rays demonstrate abundant callus formation to the fracture site  PATIENT SURVEYS:  LEFS 78/80=97.5%  COGNITION: Overall cognitive status: Within functional limits for tasks assessed     SENSATION: WFL  EDEMA:  None observed or palpated  POSTURE: Out toeing, pes planus  PALPATION: Not TTP  LOWER EXTREMITY ROM:  WFLs and equal bilat Active ROM Right eval Left eval  Hip flexion    Hip extension    Hip abduction    Hip adduction    Hip internal rotation    Hip external rotation    Knee flexion    Knee extension    Ankle dorsiflexion    Ankle plantarflexion    Ankle inversion    Ankle eversion     (Blank rows = not tested)  LOWER EXTREMITY MMT:  WFLs and equal bilat MMT Right eval  Left eval  Hip flexion    Hip extension    Hip abduction    Hip adduction    Hip internal rotation    Hip external rotation    Knee flexion    Knee extension    Ankle dorsiflexion    Ankle plantarflexion    Ankle inversion    Ankle eversion     (Blank rows = not tested)  LOWER EXTREMITY SPECIAL TESTS:  Single leg stance is to 10 bilat  GAIT: Distance walked: 200' Assistive device utilized: None Level of assistance: Complete Independence Comments: Out toeing  TREATMENT DATE:  [facility] Adult PT Treatment:                                                DATE: 12/28/23 Therapeutic Exercise: Developed, instructed in, and pt completed therex as noted in HEP  Self Care: Complete HEP every other day for 6 weeks.  Rice as need for symptom management.      PATIENT EDUCATION:  Education details: Eval findings, POC, HEP, self care  Person educated: Patient Education method: Explanation, Demonstration, Tactile cues, Verbal cues, and Handouts Education comprehension: verbalized understanding, returned demonstration, verbal cues required, and tactile cues required  HOME EXERCISE PROGRAM: Access Code: [email or web address] URL: [email or web address] Date: 12/31/2023 Prepared by: Dasie Daft  Exercises - Heel Toe Raises with Counter Support  - 1 x daily - 7 x weekly - 3 sets - 10 reps - 2 hold - Standing Single Leg Stance with Unilateral Counter Support  - 1 x daily - 7 x weekly - 1 sets - 5 reps - 20 hold - Long Sitting Ankle Eversion with Resistance (Mirrored)  - 1 x daily - 7 x weekly - 3 sets - 10 reps - 2 hold - Seated Figure 4 Ankle Inversion with Resistance  - 1 x daily - 7 x weekly - 3 sets - 10 reps  ASSESSMENT:  CLINICAL IMPRESSION: Patient is a 38 y.o. female who was seen today for physical therapy evaluation and treatment for M25.571 (ICD-10-CM)  - Pain in right ankle and joints of right foot. Pt eval and treat Gait training, stabilizatin, rom, strengthening Pt is recovering very well 7 weeks following a R ankle Fx. Pt reports she has returned to her prior functional level with only some soreness with prolonged standing and walking. She was provided with a HEP to insure reaching the final aspects of recovery. She completed it properly. Pt is pleased with how she is improving and did not see the need for additional PT visits. Pt is DCed from PT services.  OBJECTIVE IMPAIRMENTS: difficulty walking and pain.   ACTIVITY LIMITATIONS: locomotion level  PARTICIPATION LIMITATIONS: shopping and community activity  PERSONAL FACTORS: Past/current experiences and Time since onset of injury/illness/exacerbation are also affecting patient's functional outcome.   REHAB POTENTIAL: Excellent  CLINICAL DECISION MAKING: Stable/uncomplicated  EVALUATION COMPLEXITY: Low   GOALS:   SHORT TERM GOALS: Target date: 12/29/23 Pt will demonstrate proper understanding of her HEP to promote complete recovery of her L ankle injury Baseline: Provided Goal status: MET  PLAN:  PT FREQUENCY: 1 visit  PT DURATION: 1 week  PLANNED INTERVENTIONS: 97110-Therapeutic exercises and 02469- Therapeutic activity  PLAN FOR NEXT SESSION: Dced from PT services  PHYSICAL THERAPY DISCHARGE SUMMARY  Visits from Start of Care: 1  Current functional level related to goals / functional outcomes: See clinical impression and PT goals    Remaining deficits: See clinical impression and PT goals    Education / Equipment: HEP   Patient agrees to discharge. Patient goals were met. Patient is being discharged due to being pleased with the current functional level.   Breeonna Mone MS, PT 12/31/23 2:35 PM

## 2023-12-29 ENCOUNTER — Other Ambulatory Visit: Payer: Self-pay

## 2023-12-29 ENCOUNTER — Ambulatory Visit: Attending: Physician Assistant

## 2023-12-29 DIAGNOSIS — M25571 Pain in right ankle and joints of right foot: Secondary | ICD-10-CM | POA: Diagnosis not present

## 2023-12-31 DIAGNOSIS — G4733 Obstructive sleep apnea (adult) (pediatric): Secondary | ICD-10-CM | POA: Diagnosis not present

## 2023-12-31 DIAGNOSIS — J9611 Chronic respiratory failure with hypoxia: Secondary | ICD-10-CM | POA: Diagnosis not present

## 2023-12-31 NOTE — Assessment & Plan Note (Signed)
-  Stage IV with oligometastasis, MSS, HER2 (+), UGT1A1 (+), KRAS/NRAS/BRAF wild type -Presented with abdominal pain, change of bowel habits, and a 50 pound weight loss. -Colonoscopy showed malignant obstructive tumor in the distal descending colon, biopsy showed moderate differentiated invasive adenocarcinoma. -CT scan showed a 2.6 cm oligo liver metastasis.  Biopsy is pending -Patient was seen by colorectal surgeon Dr. Sheldon on October 10, 2023. -Would recommend neoadjuvant chemotherapy for 3 months, followed by surgery (left hemicolectomy, liver resection), or liver ablation or radiation if she respond well to chemo  -She started chemo FOLFOXIRI on 10/24/2023 -I discussed the NGS Tempus result and recommending Vectibix  to her chemo. She started from cycle 4 chemo  -restaging CT 6/24 showed slightly decreased colon and liver mets, no new lesions.

## 2024-01-01 ENCOUNTER — Inpatient Hospital Stay (HOSPITAL_BASED_OUTPATIENT_CLINIC_OR_DEPARTMENT_OTHER): Admitting: Hematology

## 2024-01-01 ENCOUNTER — Inpatient Hospital Stay

## 2024-01-01 ENCOUNTER — Other Ambulatory Visit: Payer: Self-pay

## 2024-01-01 VITALS — BP 132/87 | HR 87 | Temp 98.2°F | Resp 15 | Wt 243.3 lb

## 2024-01-01 DIAGNOSIS — Z95828 Presence of other vascular implants and grafts: Secondary | ICD-10-CM

## 2024-01-01 DIAGNOSIS — C186 Malignant neoplasm of descending colon: Secondary | ICD-10-CM

## 2024-01-01 DIAGNOSIS — Z5111 Encounter for antineoplastic chemotherapy: Secondary | ICD-10-CM | POA: Diagnosis not present

## 2024-01-01 LAB — CBC WITH DIFFERENTIAL (CANCER CENTER ONLY)
Abs Immature Granulocytes: 0.92 10*3/uL — ABNORMAL HIGH (ref 0.00–0.07)
Basophils Absolute: 0.1 10*3/uL (ref 0.0–0.1)
Basophils Relative: 1 %
Eosinophils Absolute: 0.2 10*3/uL (ref 0.0–0.5)
Eosinophils Relative: 2 %
HCT: 33.1 % — ABNORMAL LOW (ref 36.0–46.0)
Hemoglobin: 10.6 g/dL — ABNORMAL LOW (ref 12.0–15.0)
Immature Granulocytes: 11 %
Lymphocytes Relative: 21 %
Lymphs Abs: 1.8 10*3/uL (ref 0.7–4.0)
MCH: 24.5 pg — ABNORMAL LOW (ref 26.0–34.0)
MCHC: 32 g/dL (ref 30.0–36.0)
MCV: 76.6 fL — ABNORMAL LOW (ref 80.0–100.0)
Monocytes Absolute: 0.9 10*3/uL (ref 0.1–1.0)
Monocytes Relative: 10 %
Neutro Abs: 4.9 10*3/uL (ref 1.7–7.7)
Neutrophils Relative %: 55 %
Platelet Count: 138 10*3/uL — ABNORMAL LOW (ref 150–400)
RBC: 4.32 MIL/uL (ref 3.87–5.11)
RDW: 18 % — ABNORMAL HIGH (ref 11.5–15.5)
WBC Count: 8.8 10*3/uL (ref 4.0–10.5)
nRBC: 0.5 % — ABNORMAL HIGH (ref 0.0–0.2)

## 2024-01-01 LAB — CMP (CANCER CENTER ONLY)
ALT: 18 U/L (ref 0–44)
AST: 18 U/L (ref 15–41)
Albumin: 3.4 g/dL — ABNORMAL LOW (ref 3.5–5.0)
Alkaline Phosphatase: 108 U/L (ref 38–126)
Anion gap: 9 (ref 5–15)
BUN: 5 mg/dL — ABNORMAL LOW (ref 6–20)
CO2: 29 mmol/L (ref 22–32)
Calcium: 9.1 mg/dL (ref 8.9–10.3)
Chloride: 104 mmol/L (ref 98–111)
Creatinine: 0.5 mg/dL (ref 0.44–1.00)
GFR, Estimated: >60 mL/min (ref >60)
Glucose, Bld: 99 mg/dL (ref 70–99)
Potassium: 3 mmol/L — ABNORMAL LOW (ref 3.5–5.1)
Sodium: 142 mmol/L (ref 135–145)
Total Bilirubin: 0.3 mg/dL (ref 0.0–1.2)
Total Protein: 7.1 g/dL (ref 6.5–8.1)

## 2024-01-01 LAB — MAGNESIUM: Magnesium: 1.6 mg/dL — ABNORMAL LOW (ref 1.7–2.4)

## 2024-01-01 MED ORDER — OXALIPLATIN CHEMO INJECTION 100 MG/20ML
85.0000 mg/m2 | Freq: Once | INTRAVENOUS | Status: AC
  Administered 2024-01-01: 200 mg via INTRAVENOUS
  Filled 2024-01-01: qty 40

## 2024-01-01 MED ORDER — LEUCOVORIN CALCIUM INJECTION 350 MG
400.0000 mg/m2 | Freq: Once | INTRAVENOUS | Status: AC
  Administered 2024-01-01: 900 mg via INTRAVENOUS
  Filled 2024-01-01: qty 45

## 2024-01-01 MED ORDER — SODIUM CHLORIDE 0.9 % IV SOLN
3200.0000 mg/m2 | INTRAVENOUS | Status: DC
  Administered 2024-01-01: 7000 mg via INTRAVENOUS
  Filled 2024-01-01: qty 140

## 2024-01-01 MED ORDER — SODIUM CHLORIDE 0.9% FLUSH
10.0000 mL | INTRAVENOUS | Status: DC | PRN
Start: 2024-01-01 — End: 2024-01-01

## 2024-01-01 MED ORDER — SODIUM CHLORIDE 0.9% FLUSH
10.0000 mL | Freq: Once | INTRAVENOUS | Status: AC
  Administered 2024-01-01: 10 mL

## 2024-01-01 MED ORDER — SODIUM CHLORIDE 0.9 % IV SOLN
150.0000 mg/m2 | Freq: Once | INTRAVENOUS | Status: AC
  Administered 2024-01-01: 340 mg via INTRAVENOUS
  Filled 2024-01-01: qty 15

## 2024-01-01 MED ORDER — DEXTROSE 5 % IV SOLN: INTRAVENOUS | Status: DC

## 2024-01-01 MED ORDER — APREPITANT 130 MG/18ML IV EMUL
130.0000 mg | Freq: Once | INTRAVENOUS | Status: AC
  Administered 2024-01-01: 130 mg via INTRAVENOUS
  Filled 2024-01-01: qty 18

## 2024-01-01 MED ORDER — PALONOSETRON HCL INJECTION 0.25 MG/5ML
0.2500 mg | Freq: Once | INTRAVENOUS | Status: AC
  Administered 2024-01-01: 0.25 mg via INTRAVENOUS
  Filled 2024-01-01: qty 5

## 2024-01-01 MED ORDER — DEXAMETHASONE SODIUM PHOSPHATE 10 MG/ML IJ SOLN
10.0000 mg | Freq: Once | INTRAMUSCULAR | Status: AC
  Administered 2024-01-01: 10 mg via INTRAVENOUS
  Filled 2024-01-01: qty 1

## 2024-01-01 MED ORDER — MAGNESIUM OXIDE -MG SUPPLEMENT 400 (240 MG) MG PO TABS: 400.0000 mg | ORAL_TABLET | Freq: Two times a day (BID) | ORAL | 1 refills | Status: DC

## 2024-01-01 MED ORDER — HEPARIN SOD (PORK) LOCK FLUSH 100 UNIT/ML IV SOLN: 500.0000 [IU] | Freq: Once | INTRAVENOUS | Status: DC | PRN

## 2024-01-01 MED ORDER — DOXYCYCLINE HYCLATE 100 MG PO TABS: 100.0000 mg | ORAL_TABLET | Freq: Two times a day (BID) | ORAL | 1 refills | Status: DC

## 2024-01-01 MED ORDER — SODIUM CHLORIDE 0.9 % IV SOLN: Freq: Once | INTRAVENOUS | Status: AC

## 2024-01-01 MED ORDER — SODIUM CHLORIDE 0.9 % IV SOLN
6.0000 mg/kg | Freq: Once | INTRAVENOUS | Status: AC
  Administered 2024-01-01: 700 mg via INTRAVENOUS
  Filled 2024-01-01: qty 15

## 2024-01-01 MED ORDER — MAGNESIUM SULFATE 2 GM/50ML IV SOLN
2.0000 g | Freq: Once | INTRAVENOUS | Status: AC
  Administered 2024-01-01: 2 g via INTRAVENOUS
  Filled 2024-01-01: qty 50

## 2024-01-01 MED ORDER — ATROPINE SULFATE 1 MG/ML IV SOLN
0.5000 mg | Freq: Once | INTRAVENOUS | Status: AC | PRN
  Administered 2024-01-01: 0.5 mg via INTRAVENOUS
  Filled 2024-01-01: qty 1

## 2024-01-01 NOTE — Patient Instructions (Addendum)
 Hypokalemia Hypokalemia means that the amount of potassium in the blood is lower than normal. Potassium is a mineral (electrolyte) that helps regulate the amount of fluid in the body. It also stimulates muscle tightening (contraction) and helps nerves work properly. Normally, most of the body's potassium is inside cells, and only a very small amount is in the blood. Because the amount in the blood is so small, minor changes to potassium levels in the blood can be life-threatening. What are the causes? This condition may be caused by: Antibiotic medicine. Diarrhea or vomiting. Taking too much of a medicine that helps you have a bowel movement (laxative) can cause diarrhea and lead to hypokalemia. Chronic kidney disease (CKD). Medicines that help the body get rid of excess fluid (diuretics). Eating disorders, such as anorexia or bulimia. Low magnesium  levels in the body. Sweating a lot. What are the signs or symptoms? Symptoms of this condition include: Weakness. Constipation. Fatigue. Muscle cramps. Mental confusion. Skipped heartbeats or irregular heartbeat (palpitations). Tingling or numbness. How is this diagnosed? This condition is diagnosed with a blood test. How is this treated? This condition may be treated by: Taking potassium supplements. Adjusting the medicines that you take. Eating more foods that contain a lot of potassium. If your potassium level is very low, you may need to get potassium through an IV and be monitored in the hospital. Follow these instructions at home: Eating and drinking  Eat a healthy diet. A healthy diet includes fresh fruits and vegetables, whole grains, healthy fats, and lean proteins. If told, eat more foods that contain a lot of potassium. These include: Nuts, such as peanuts and pistachios. Seeds, such as sunflower seeds and pumpkin seeds. Peas, lentils, and lima beans. Whole grain and bran cereals and breads. Fresh fruits and vegetables,  such as apricots, avocado, bananas, cantaloupe, kiwi, oranges, tomatoes, asparagus, and potatoes. Juices, such as orange, tomato, and prune. Lean meats, including fish. Milk and milk products, such as yogurt. General instructions Take over-the-counter and prescription medicines only as told by your health care provider. This includes vitamins, natural food products, and supplements. Keep all follow-up visits. This is important. Contact a health care provider if: You have weakness that gets worse. You feel your heart pounding or racing. You vomit. You have diarrhea. You have diabetes and you have trouble keeping your blood sugar in your target range. Get help right away if: You have chest pain. You have shortness of breath. You have vomiting or diarrhea that lasts for more than 2 days. You faint. These symptoms may be an emergency. Get help right away. Call 911. Do not wait to see if the symptoms will go away. Do not drive yourself to the hospital. Summary Hypokalemia means that the amount of potassium in the blood is lower than normal. This condition is diagnosed with a blood test. Hypokalemia may be treated by taking potassium supplements, adjusting the medicines that you take, or eating more foods that are high in potassium. If your potassium level is very low, you may need to get potassium through an IV and be monitored in the hospital. This information is not intended to replace advice given to you by your health care provider. Make sure you discuss any questions you have with your health care provider. Hypomagnesemia Hypomagnesemia is a condition in which the level of magnesium  in the blood is too low. Magnesium  is a mineral that is found in many foods. It is used in many different processes in the body. Hypomagnesemia  can affect every organ in the body. In severe cases, it can cause life-threatening problems. What are the causes? This condition may be caused by: Not getting  enough magnesium  in your diet or not having enough healthy foods to eat (malnutrition). Problems with magnesium  absorption in the intestines. Dehydration. Excessive use of alcohol. Vomiting. Severe or long-term (chronic) diarrhea. Some medicines, including medicines that make you urinate more often (diuretics). Certain diseases, such as kidney disease, diabetes, celiac disease, and overactive thyroid . What are the signs or symptoms? Symptoms of this condition include: Loss of appetite, nausea, and vomiting. Involuntary shaking or trembling of a body part (tremor). Muscle weakness or tingling in the arms and legs. Sudden tightening of muscles (muscle spasms). Confusion. Psychiatric issues, such as: Depression and irritability. Psychosis. A feeling of fluttering of the heart (palpitations). Seizures. These symptoms are more severe if magnesium  levels drop suddenly. How is this diagnosed? This condition may be diagnosed based on: Your symptoms and medical history. A physical exam. Blood and urine tests. How is this treated? Treatment depends on the cause and the severity of the condition. It may be treated by: Taking a magnesium  supplement. This can be taken in pill form. If the condition is severe, magnesium  is usually given through an IV. Making changes to your diet. You may be directed to eat foods that have a lot of magnesium , such as green leafy vegetables, peas, beans, and nuts. Not drinking alcohol. If you are struggling not to drink, ask your health care provider for help. Follow these instructions at home: Eating and drinking     Make sure that your diet includes foods with magnesium . Foods that have a lot of magnesium  in them include: Green leafy vegetables, such as spinach and broccoli. Beans and peas. Nuts and seeds, such as almonds and sunflower seeds. Whole grains, such as whole grain bread and fortified cereals. Drink fluids that contain salts and minerals  (electrolytes), such as sports drinks, when you are active. Do not drink alcohol. General instructions Take over-the-counter and prescription medicines only as told by your health care provider. Take magnesium  supplements as directed if your health care provider tells you to take them. Have your magnesium  levels monitored as told by your health care provider. Keep all follow-up visits. This is important. Contact a health care provider if: You get worse instead of better. Your symptoms return. Get help right away if: You develop severe muscle weakness. You have trouble breathing. You feel that your heart is racing. These symptoms may represent a serious problem that is an emergency. Do not wait to see if the symptoms will go away. Get medical help right away. Call your local emergency services (911 in the [place] Do not drive yourself to the hospital. Summary Hypomagnesemia is a condition in which the level of magnesium  in the blood is too low. Hypomagnesemia can affect every organ in the body. Treatment may include eating more foods that contain magnesium , taking magnesium  supplements, and not drinking alcohol. Have your magnesium  levels monitored as told by your health care provider. This information is not intended to replace advice given to you by your health care provider. Make sure you discuss any questions you have with your health care provider. Document Revised: 11/17/2020 Document Reviewed: 11/17/2020 Elsevier Patient Education  2024 Elsevier Inc. Document Revised: 03/04/2021 Document Reviewed: 03/04/2021  [facility] - A DEPT OF [facility]  Discharge Instructions: Thank you for choosing [facility]  to provide your oncology and hematology care.   If you have a lab appointment with the Cancer Center, please go directly to the Cancer Center and check in at the registration area.   Wear comfortable clothing and clothing appropriate  for easy access to any Portacath or PICC line.   We strive to give you quality time with your provider. You may need to reschedule your appointment if you arrive late (15 or more minutes).  Arriving late affects you and other patients whose appointments are after yours.  Also, if you miss three or more appointments without notifying the office, you may be dismissed from the clinic at the provider's discretion.      For prescription refill requests, have your pharmacy contact our office and allow 72 hours for refills to be completed.    Today you received the following chemotherapy and/or immunotherapy agents: Panitumumab , Irinotecn, Oxaliplatin , leucovorin , and Fluorouracil .   To help prevent nausea and vomiting after your treatment, we encourage you to take your nausea medication as directed.  BELOW ARE SYMPTOMS THAT SHOULD BE REPORTED IMMEDIATELY: *FEVER GREATER THAN 100.4 F (38 C) OR HIGHER *CHILLS OR SWEATING *NAUSEA AND VOMITING THAT IS NOT CONTROLLED WITH YOUR NAUSEA MEDICATION *UNUSUAL SHORTNESS OF BREATH *UNUSUAL BRUISING OR BLEEDING *URINARY PROBLEMS (pain or burning when urinating, or frequent urination) *BOWEL PROBLEMS (unusual diarrhea, constipation, pain near the anus) TENDERNESS IN MOUTH AND THROAT WITH OR WITHOUT PRESENCE OF ULCERS (sore throat, sores in mouth, or a toothache) UNUSUAL RASH, SWELLING OR PAIN  UNUSUAL VAGINAL DISCHARGE OR ITCHING   Items with * indicate a potential emergency and should be followed up as soon as possible or go to the Emergency Department if any problems should occur.  Please show the CHEMOTHERAPY ALERT CARD or IMMUNOTHERAPY ALERT CARD at check-in to the Emergency Department and triage nurse.  Should you have questions after your visit or need to cancel or reschedule your appointment, please contact [facility] - A DEPT OF [name]  Dept: [phone]  and follow the prompts.  Office hours are 8:00 a.m. to  4:30 p.m. Monday - Friday. Please note that voicemails left after 4:00 p.m. may not be returned until the following business day.  We are closed weekends and major holidays. You have access to a nurse at all times for urgent questions. Please call the main number to the clinic Dept: [phone] and follow the prompts.   For any non-urgent questions, you may also contact your provider using MyChart. We now offer e-Visits for anyone 50 and older to request care online for non-urgent symptoms. For details visit [email or web address].   Also download the MyChart app! Go to the app store, search MyChart, open the app, select [place], and log in with your MyChart username and password.  The chemotherapy medication bag should finish at 46 hours, 96 hours, or 7 days. For example, if your pump is scheduled for 46 hours and it was put on at 4:00 p.m., it should finish at 2:00 p.m. the day it is scheduled to come off regardless of your appointment time.     Estimated time to finish at:    If the display on your pump reads Low Volume and it is beeping, take the batteries out of the pump and come to the cancer center for it to be taken off.   If the pump alarms go off prior to the pump reading Low Volume then call [phone] and someone  can assist you.  If the plunger comes out and the chemotherapy medication is leaking out, please use your home chemo spill kit to clean up the spill. Do NOT use paper towels or other household products.  If you have problems or questions regarding your pump, please call either [phone] (24 hours a day) or the cancer center Monday-Friday 8:00 a.m.- 4:30 p.m. at the clinic number and we will assist you. If you are unable to get assistance, then go to the nearest Emergency Department and ask the staff to contact the IV team for assistance.    Elsevier Patient Education  2024 [employer].

## 2024-01-01 NOTE — Progress Notes (Signed)
 Per Dr. Lanny, pt will get 2gm of Mg+ today along with chemotherapy. Dr. Lanny also stated she prescribed home prescription for PO Mg+ in addition.

## 2024-01-01 NOTE — Progress Notes (Signed)
 [facility]   [phone] [phone]   Clinic Follow up Note   Patient Care Team: [name], MD as PCP - General (Family Medicine) [name], [name], RN as Oncology Nurse Navigator [name], MD as Consulting Physician (Gastroenterology) [name], MD as Consulting Physician (Medical Oncology) [name], MD as Consulting Physician (Obstetrics and Gynecology) [name], MD as Consulting Physician (General Surgery) [name], MD as Consulting Physician (General Surgery)  Date of Service:  01/01/2024  CHIEF COMPLAINT: f/u of colon cancer  CURRENT THERAPY:  First-line chemotherapy FOLFOXIRI   Oncology History   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 -Colonoscopy showed malignant obstructive tumor in the distal descending colon, biopsy showed moderate differentiated invasive adenocarcinoma. -CT scan showed a 2.6 cm oligo liver metastasis.  Biopsy is pending -Patient was seen by colorectal surgeon Dr. Sheldon on October 10, 2023. -Would recommend neoadjuvant chemotherapy for 3 months, followed by surgery (left hemicolectomy, liver resection), or liver ablation or radiation if she respond well to chemo  -She started chemo FOLFOXIRI on 10/24/2023 -I discussed the NGS Tempus result and recommending Vectibix  to her chemo. She started from cycle 4 chemo  -restaging CT 6/24 showed slightly decreased colon and liver mets, no new lesions.   Assessment & Plan Metastatic colon cancer Undergoing chemotherapy with Vectibix . Reports significant fatigue on treatment day, recovering by the next day. Nausea managed with peppermint and ginger. CT scan shows partial response. Case to be presented at GI tumor conference to evaluate surgical intervention. If surgery is planned soon,  chemotherapy will be stopped after today's session. - Present case at GI tumor conference to evaluate surgical options - Continue chemotherapy unless surgery is planned - Manage nausea with peppermint and ginger  Rash due to Vectibix  Developed acneiform rash associated with Vectibix  treatment, present for a couple of weeks since last chemotherapy. Uses hydrocortisone cream occasionally. Not on oral antibiotics. Decision made to prescribe doxycycline  for rash management. - Prescribe doxycycline  for rash management - Advise use of hydrocortisone cream once daily  Low magnesium  level Magnesium  level is low, likely due to Vectibix  treatment. Requires correction to prevent potential complications. - Administer IV magnesium  2 grams  Plan - Restaging CT scan reviewed - Lab reviewed, adequate for treatment, will proceed cycle 6 chemo today - Will review her case in GI conference this week, to see if she can have surgery now, versus therapy - I called in doxycycline  for her skin rash - Will give 2 g of magnesium  in the infusion room, and I also called in oral magnesium    SUMMARY OF ONCOLOGIC HISTORY: Oncology History  Adenocarcinoma of descending colon (HCC)  09/11/2023 Cancer Staging   Staging form: Colon and Rectum, AJCC 8th Edition - Clinical stage from 09/11/2023: Stage IVA (cTX, cN1, cM1a) - Signed by [name], MD on 10/12/2023   10/01/2023 Initial Diagnosis   Adenocarcinoma of descending colon (HCC)   10/24/2023 -  Chemotherapy   Patient is on Treatment Plan : COLORECTAL FOLFOXIRI q14d      Genetic Testing   Negative CancerNext +RNAinsight. The [employer] CancerNext+RNAinsight Panel includes sequencing, rearrangement analysis, and RNA analysis for the following 39 genes: APC, ATM, BAP1, BARD1, BMPR1A, BRCA1, BRCA2, BRIP1, CDH1, CDKN2A, CHEK2, FH, FLCN, MET, MLH1, MSH2, MSH6, MUTYH, NF1, NTHL1, PALB2, PMS2, PTEN, RAD51C, RAD51D, SMAD4, STK11, TP53, TSC1,  TSC2, and VHL (sequencing and  deletion/duplication); AXIN2, HOXB13, MBD4, MSH3, POLD1 and POLE (sequencing only); EPCAM and GREM1 (deletion/duplication only). Report date 11/04/23.       Discussed the use of AI scribe software for clinical note transcription with the patient, who gave verbal consent to proceed.  History of Present Illness Ashley [name] is a 38 year old female with metastatic colon cancer who presents for follow-up.  She has a pruritic skin rash for a couple of weeks, associated with recent chemotherapy sessions, particularly Vectibix . She uses hydrocortisone cream occasionally and is not on oral antibiotics for the rash.  Chemotherapy is manageable but causes exhaustion on the day of treatment, requiring a full day of rest. By the following day, she resumes usual activities at home. Her weight is stable at 243 pounds, and she maintains a good appetite.  Nausea is present but managed with peppermint and ginger candy or tea. Diarrhea occurred about a week ago but resolved quickly. No significant neuropathy or cold sensitivity, though she avoids cold exposure. She experienced a mild tingling sensation once but could consume ice cream and a milkshake with only a slight rough sensation on her tongue.     All other systems were reviewed with the patient and are negative.  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   Irregular periods/menstrual cycles 01/30/2012   Late prenatal care 01/30/2012   Poor social situation 01/30/2012   Previous cesarean section complicating pregnancy, antepartum condition or complication 01/27/2016   2 previous cesarean section- Desires repeat with BTL   Single liveborn, born in hospital, delivered by cesarean delivery 08/13/2016   Status post repeat low transverse cesarean section 08/14/2016   Status post  tubal ligation at time of delivery, current hosp 08/14/2016    SURGICAL HISTORY: Past Surgical History:  Procedure Laterality Date   CESAREAN SECTION  2009   CESAREAN SECTION  01/30/2012   Procedure: CESAREAN SECTION;  Surgeon: Ovid DELENA All, MD;  Location: [facility];  Service: Gynecology;  Laterality: N/A;  Repeat C/S   CESAREAN SECTION WITH BILATERAL TUBAL LIGATION Bilateral 08/13/2016   Procedure: CESAREAN SECTION WITH BILATERAL TUBAL LIGATION;  Surgeon: Burnard VEAR Pate, MD;  Location: [facility];  Service: Obstetrics;  Laterality: Bilateral;   CHOLECYSTECTOMY N/A 10/16/2013   Procedure: LAPAROSCOPIC CHOLECYSTECTOMY WITH INTRAOPERATIVE CHOLANGIOGRAM;  Surgeon: Donnice POUR. Belinda, MD;  Location: [facility] OR;  Service: General;  Laterality: N/A;   COLONOSCOPY WITH PROPOFOL  N/A 09/11/2023   Procedure: COLONOSCOPY WITH PROPOFOL ;  Surgeon: [name], MD;  Location: [facility];  Service: Gastroenterology;  Laterality: N/A;   DILATION AND EVACUATION N/A 07/28/2015   Procedure: SUCTION, DILATATION AND EVACUATION;  Surgeon: Carlin DELENA Centers, MD;  Location: [facility];  Service: Gynecology;  Laterality: N/A;   ESOPHAGOGASTRODUODENOSCOPY (EGD) WITH PROPOFOL  N/A 09/11/2023   Procedure: ESOPHAGOGASTRODUODENOSCOPY (EGD) WITH PROPOFOL ;  Surgeon: [name], MD;  Location: [facility];  Service: Gastroenterology;  Laterality: N/A;   IR IMAGING GUIDED PORT INSERTION  10/20/2023   IR US  LIVER BIOPSY  10/20/2023   SUBMUCOSAL INJECTION  09/11/2023   Procedure: INJECTION, SUBMUCOSAL;  Surgeon: [name], MD;  Location: [facility];  Service: Gastroenterology;;   TONSILLECTOMY  AGE 39   TONSILLECTOMY AND ADENOIDECTOMY  AGEB 18    I have reviewed the  social history and family history with the patient and they are unchanged from previous note.  ALLERGIES:  is allergic to fruit extracts.  MEDICATIONS:  Current Outpatient Medications  Medication Sig Dispense Refill   doxycycline  (VIBRA -TABS) 100 MG  tablet Take 1 tablet (100 mg total) by mouth 2 (two) times daily. 60 tablet 1   amLODipine (NORVASC) 10 MG tablet Take 10 mg by mouth daily.     clindamycin (CLINDAGEL) 1 % gel Apply topically 2 (two) times daily. 30 g 2   hydrochlorothiazide (HYDRODIURIL) 25 MG tablet Take 25 mg by mouth daily.     hyoscyamine  (LEVSIN  SL) 0.125 MG SL tablet Place 1 tablet (0.125 mg total) under the tongue every 6 (six) hours as needed for cramping (nausea, diarrhea). 50 tablet 1   ibuprofen  (ADVIL ) 800 MG tablet Take 800 mg by mouth 3 (three) times daily.     lidocaine -prilocaine  (EMLA ) cream Apply to affected area once 30 g 3   loratadine  (CLARITIN ) 10 MG tablet Take 1 tablet (10 mg total) by mouth daily. 90 tablet 0   magnesium  oxide (MAG-OX) 400 (240 Mg) MG tablet Take 1 tablet (400 mg total) by mouth 2 (two) times daily. 60 tablet 1   meloxicam (MOBIC) 15 MG tablet Take 15 mg by mouth daily.     omeprazole  (PRILOSEC  OTC) 20 MG tablet Take 1 tablet (20 mg total) by mouth daily. 28 tablet 1   ondansetron  (ZOFRAN ) 8 MG tablet Take 1 tablet (8 mg total) by mouth every 8 (eight) hours as needed for nausea or vomiting. Start on the third day after chemotherapy 30 tablet 1   ondansetron  (ZOFRAN -ODT) 4 MG disintegrating tablet Take 1 tablet (4 mg total) by mouth every 8 (eight) hours as needed for nausea or vomiting. 20 tablet 0   potassium chloride  SA (KLOR-CON  M) 20 MEQ tablet TAKE 1 TABLET DAILY. PLEASE FOLLOW UP WITH YOUR PRIMARY CARE DOCTOR REGARDING ADDITIONAL REFILLS. 30 tablet 0   prochlorperazine  (COMPAZINE ) 10 MG tablet Take 1 tablet (10 mg total) by mouth every 6 (six) hours as needed for nausea or vomiting (Nausea or vomiting). 30 tablet 1   No current facility-administered medications for this visit.   Facility-Administered Medications Ordered in Other Visits  Medication Dose Route Frequency Provider Last Rate Last Admin   dextrose  5 % solution   Intravenous Continuous [name], MD       fluorouracil   (ADRUCIL ) 7,000 mg in sodium chloride  0.9 % 110 mL chemo infusion  3,200 mg/m2 (Treatment Plan Recorded) Intravenous 1 day or 1 dose [name], MD   Infusion Verify at 01/01/24 1649   heparin  lock flush 100 unit/mL  500 Units Intracatheter Once PRN [name], MD       sodium chloride  flush (NS) 0.9 % injection 10 mL  10 mL Intracatheter PRN [name], MD        PHYSICAL EXAMINATION: ECOG PERFORMANCE STATUS: 1 - Symptomatic but completely ambulatory  Vitals:   01/01/24 0838  BP: 132/87  Pulse: 87  Resp: 15  Temp: 98.2 F (36.8 C)  SpO2: 100%   Wt Readings from Last 3 Encounters:  01/01/24 243 lb 4.8 oz (110.4 kg)  12/05/23 243 lb 12 oz (110.6 kg)  12/17/23 243 lb (110.2 kg)     GENERAL:alert, no distress and comfortable SKIN: skin color, texture, turgor are normal, no rashes or significant lesions EYES: normal, Conjunctiva are pink and non-injected, sclera clear NECK: supple, thyroid  normal size, non-tender, without nodularity LYMPH:  no palpable lymphadenopathy in the cervical, axillary  LUNGS: clear to auscultation and percussion with normal breathing effort HEART: regular rate & rhythm and no murmurs and no lower extremity edema ABDOMEN:abdomen soft, non-tender and normal bowel sounds Musculoskeletal:no cyanosis of digits and no clubbing  NEURO: alert & oriented x 3 with fluent speech, no focal motor/sensory deficits  Physical Exam MEASUREMENTS: Weight- 243.  LABORATORY DATA:  I have reviewed the data as listed    Latest Ref Rng & Units 01/01/2024    7:58 AM 12/18/2023    9:05 AM 12/05/2023    9:35 AM  CBC  WBC 4.0 - 10.5 K/uL 8.8  5.8  10.2   Hemoglobin 12.0 - 15.0 g/dL 89.3  89.8  89.9   Hematocrit 36.0 - 46.0 % 33.1  31.9  31.6   Platelets 150 - 400 K/uL 138  122  220         Latest Ref Rng & Units 01/01/2024    7:58 AM 12/18/2023    9:05 AM 12/05/2023    9:35 AM  CMP  Glucose 70 - 99 mg/dL 99  891  97   BUN 6 - 20 mg/dL 5  <5  10   Creatinine 0.44 - 1.00  mg/dL 9.49  9.50  9.41   Sodium 135 - 145 mmol/L 142  141  141   Potassium 3.5 - 5.1 mmol/L 3.0  3.2  3.9   Chloride 98 - 111 mmol/L 104  103  105   CO2 22 - 32 mmol/L 29  31  30    Calcium  8.9 - 10.3 mg/dL 9.1  8.9  9.3   Total Protein 6.5 - 8.1 g/dL 7.1  6.9  7.3   Total Bilirubin 0.0 - 1.2 mg/dL 0.3  0.3  0.3   Alkaline Phos 38 - 126 U/L 108  99  98   AST 15 - 41 U/L 18  15  13    ALT 0 - 44 U/L 18  20  18        RADIOGRAPHIC STUDIES: I have personally reviewed the radiological images as listed and agreed with the findings in the report. No results found.    Orders Placed This Encounter  Procedures   CBC with Differential (Cancer Center Only)    Standing Status:   Future    Expected Date:   01/15/2024    Expiration Date:   01/14/2025   CMP (Cancer Center only)    Standing Status:   Future    Expected Date:   01/15/2024    Expiration Date:   01/14/2025   All questions were answered. The patient knows to call the clinic with any problems, questions or concerns. No barriers to learning was detected. The total time spent in the appointment was 30 minutes, including review of chart and various tests results, discussions about plan of care and coordination of care plan     Onita Mattock, MD 01/01/2024

## 2024-01-03 ENCOUNTER — Inpatient Hospital Stay: Attending: Nurse Practitioner

## 2024-01-03 ENCOUNTER — Other Ambulatory Visit: Payer: Self-pay

## 2024-01-03 DIAGNOSIS — Z79634 Long term (current) use of topoisomerase inhibitor: Secondary | ICD-10-CM | POA: Diagnosis not present

## 2024-01-03 DIAGNOSIS — Z79631 Long term (current) use of antimetabolite agent: Secondary | ICD-10-CM | POA: Diagnosis not present

## 2024-01-03 DIAGNOSIS — C186 Malignant neoplasm of descending colon: Secondary | ICD-10-CM | POA: Insufficient documentation

## 2024-01-03 DIAGNOSIS — Z79899 Other long term (current) drug therapy: Secondary | ICD-10-CM | POA: Insufficient documentation

## 2024-01-03 DIAGNOSIS — Z5111 Encounter for antineoplastic chemotherapy: Secondary | ICD-10-CM | POA: Diagnosis not present

## 2024-01-03 DIAGNOSIS — Z79630 Long term (current) use of alkylating agent: Secondary | ICD-10-CM | POA: Diagnosis not present

## 2024-01-03 DIAGNOSIS — Z5189 Encounter for other specified aftercare: Secondary | ICD-10-CM | POA: Insufficient documentation

## 2024-01-03 DIAGNOSIS — Z5112 Encounter for antineoplastic immunotherapy: Secondary | ICD-10-CM | POA: Diagnosis not present

## 2024-01-03 MED ORDER — SODIUM CHLORIDE 0.9% FLUSH
10.0000 mL | INTRAVENOUS | Status: DC | PRN
  Administered 2024-01-03: 10 mL

## 2024-01-03 MED ORDER — PEGFILGRASTIM-CBQV 6 MG/0.6ML ~~LOC~~ SOSY
6.0000 mg | PREFILLED_SYRINGE | Freq: Once | SUBCUTANEOUS | Status: AC
  Administered 2024-01-03: 6 mg via SUBCUTANEOUS
  Filled 2024-01-03: qty 0.6

## 2024-01-03 MED ORDER — HEPARIN SOD (PORK) LOCK FLUSH 100 UNIT/ML IV SOLN
500.0000 [IU] | Freq: Once | INTRAVENOUS | Status: AC | PRN
  Administered 2024-01-03: 500 [IU]

## 2024-01-03 NOTE — Progress Notes (Signed)
  PATIENT NAVIGATOR PROGRESS NOTE  Name: Ashley [name] Date: 01/03/2024 MRN: [id number]  DOB: October 10, 1985 Per Dr. Demetra request, contacted patient to let her know that her case was discussed during the GI Multidisciplinary Conference this morning.  Informed patient of the following recommendations:   Could you call pt and let her know the tumor board recommendation? Ask if she is open to see liver surgeon at [facility] (will ask them to review her scan first), and the other option is liver ablation if surgery not recommended or if she does not want to go through liver surgery. Plan to continue chemo for now.   Please call radiation and request her scans to be pushed over to [facility]. (Per Staff message from Dr. Lanny)  Patient stated she would prefer to pursue liver ablation instead of liver surgery.  Patient stated she is hesitant to pursue liver surgery due to recovery time.  Patient is agreeable to have liver surgeon at [facility] review her scans and is agreeable to continue chemo.    Dr. Lanny was made aware of patient's response.  No new orders at this time.  Images have been shared with [facility]. Will follow-up with patient at her next scheduled appt with Dr. Lanny.    Time spent counseling/coordinating care: 30-45 minutes

## 2024-01-04 ENCOUNTER — Ambulatory Visit: Payer: Self-pay | Admitting: Nurse Practitioner

## 2024-01-05 ENCOUNTER — Encounter: Payer: Self-pay | Admitting: Hematology

## 2024-01-05 NOTE — Progress Notes (Signed)
 The proposed treatment discussed in conference is for discussion purpose only and is not a binding recommendation.  The patients have not been physically examined, or presented with their treatment options.  Therefore, final treatment plans cannot be decided.

## 2024-01-08 ENCOUNTER — Other Ambulatory Visit: Payer: Self-pay | Admitting: Hematology

## 2024-01-08 DIAGNOSIS — C186 Malignant neoplasm of descending colon: Secondary | ICD-10-CM

## 2024-01-10 ENCOUNTER — Other Ambulatory Visit: Payer: Self-pay | Admitting: Hematology

## 2024-01-10 ENCOUNTER — Telehealth: Payer: Self-pay | Admitting: Hematology

## 2024-01-10 ENCOUNTER — Other Ambulatory Visit: Payer: Self-pay

## 2024-01-10 DIAGNOSIS — K6389 Other specified diseases of intestine: Secondary | ICD-10-CM

## 2024-01-10 DIAGNOSIS — C186 Malignant neoplasm of descending colon: Secondary | ICD-10-CM

## 2024-01-10 NOTE — Telephone Encounter (Signed)
 Scheduled appointments per WQ. Talked with the patient and she is aware of the made appointments.

## 2024-01-10 NOTE — Progress Notes (Signed)
 Order placed based on Secure Chat message from Dr. Lanny and faxed to Luke Kerns, RN for Dr. Toribio Hammersmith [facility] Oncologist [phone]).  Fax confirmation received.  Secure Chat message from Dr. Lanny:  Anders, could you send her referral to Dr. Hammersmith at [facility]? thx

## 2024-01-14 NOTE — Assessment & Plan Note (Signed)
-  Stage IV with oligometastasis, MSS, HER2 (+), UGT1A1 (+), KRAS/NRAS/BRAF wild type -Presented with abdominal pain, change of bowel habits, and a 50 pound weight loss. -Colonoscopy showed malignant obstructive tumor in the distal descending colon, biopsy showed moderate differentiated invasive adenocarcinoma. -CT scan showed a 2.6 cm oligo liver metastasis.  Biopsy is pending -Patient was seen by colorectal surgeon Dr. Sheldon on October 10, 2023. -Would recommend neoadjuvant chemotherapy for 3 months, followed by surgery (left hemicolectomy, liver resection), or liver ablation or radiation if she respond well to chemo  -She started chemo FOLFOXIRI on 10/24/2023 -I discussed the NGS Tempus result and recommending Vectibix  to her chemo. She started from cycle 4 chemo  -restaging CT 6/24 showed slightly decreased colon and liver mets, no new lesions.  -Surgeon Dr. Dasie reviewed her case and recommend pt to be referred to [facility] for liver resection

## 2024-01-15 ENCOUNTER — Encounter: Payer: Self-pay | Admitting: Hematology

## 2024-01-15 ENCOUNTER — Inpatient Hospital Stay

## 2024-01-15 ENCOUNTER — Other Ambulatory Visit: Payer: Self-pay

## 2024-01-15 ENCOUNTER — Inpatient Hospital Stay (HOSPITAL_BASED_OUTPATIENT_CLINIC_OR_DEPARTMENT_OTHER): Admitting: Hematology

## 2024-01-15 VITALS — BP 150/100 | HR 85 | Temp 99.1°F | Resp 17 | Wt 248.3 lb

## 2024-01-15 VITALS — BP 128/76

## 2024-01-15 DIAGNOSIS — C186 Malignant neoplasm of descending colon: Secondary | ICD-10-CM | POA: Diagnosis not present

## 2024-01-15 DIAGNOSIS — Z79631 Long term (current) use of antimetabolite agent: Secondary | ICD-10-CM | POA: Diagnosis not present

## 2024-01-15 DIAGNOSIS — Z5189 Encounter for other specified aftercare: Secondary | ICD-10-CM | POA: Diagnosis not present

## 2024-01-15 DIAGNOSIS — Z5112 Encounter for antineoplastic immunotherapy: Secondary | ICD-10-CM | POA: Diagnosis not present

## 2024-01-15 DIAGNOSIS — Z95828 Presence of other vascular implants and grafts: Secondary | ICD-10-CM

## 2024-01-15 DIAGNOSIS — Z5111 Encounter for antineoplastic chemotherapy: Secondary | ICD-10-CM | POA: Diagnosis not present

## 2024-01-15 DIAGNOSIS — Z79630 Long term (current) use of alkylating agent: Secondary | ICD-10-CM | POA: Diagnosis not present

## 2024-01-15 DIAGNOSIS — Z79634 Long term (current) use of topoisomerase inhibitor: Secondary | ICD-10-CM | POA: Diagnosis not present

## 2024-01-15 DIAGNOSIS — Z79899 Other long term (current) drug therapy: Secondary | ICD-10-CM | POA: Diagnosis not present

## 2024-01-15 LAB — CMP (CANCER CENTER ONLY)
ALT: 17 U/L (ref 0–44)
AST: 17 U/L (ref 15–41)
Albumin: 3.2 g/dL — ABNORMAL LOW (ref 3.5–5.0)
Alkaline Phosphatase: 107 U/L (ref 38–126)
Anion gap: 5 (ref 5–15)
BUN: <5 mg/dL — ABNORMAL LOW (ref 6–20)
CO2: 31 mmol/L (ref 22–32)
Calcium: 8.7 mg/dL — ABNORMAL LOW (ref 8.9–10.3)
Chloride: 106 mmol/L (ref 98–111)
Creatinine: 0.55 mg/dL (ref 0.44–1.00)
GFR, Estimated: >60 mL/min (ref >60)
Glucose, Bld: 107 mg/dL — ABNORMAL HIGH (ref 70–99)
Potassium: 3.5 mmol/L (ref 3.5–5.1)
Sodium: 142 mmol/L (ref 135–145)
Total Bilirubin: 0.3 mg/dL (ref 0.0–1.2)
Total Protein: 6.5 g/dL (ref 6.5–8.1)

## 2024-01-15 LAB — MAGNESIUM: Magnesium: 1.6 mg/dL — ABNORMAL LOW (ref 1.7–2.4)

## 2024-01-15 LAB — CBC WITH DIFFERENTIAL (CANCER CENTER ONLY)
Abs Immature Granulocytes: 0.74 K/uL — ABNORMAL HIGH (ref 0.00–0.07)
Basophils Absolute: 0.1 K/uL (ref 0.0–0.1)
Basophils Relative: 1 %
Eosinophils Absolute: 0.2 K/uL (ref 0.0–0.5)
Eosinophils Relative: 2 %
HCT: 32.6 % — ABNORMAL LOW (ref 36.0–46.0)
Hemoglobin: 10.2 g/dL — ABNORMAL LOW (ref 12.0–15.0)
Immature Granulocytes: 9 %
Lymphocytes Relative: 24 %
Lymphs Abs: 2 K/uL (ref 0.7–4.0)
MCH: 24.8 pg — ABNORMAL LOW (ref 26.0–34.0)
MCHC: 31.3 g/dL (ref 30.0–36.0)
MCV: 79.3 fL — ABNORMAL LOW (ref 80.0–100.0)
Monocytes Absolute: 1 K/uL (ref 0.1–1.0)
Monocytes Relative: 12 %
Neutro Abs: 4.3 K/uL (ref 1.7–7.7)
Neutrophils Relative %: 52 %
Platelet Count: 141 K/uL — ABNORMAL LOW (ref 150–400)
RBC: 4.11 MIL/uL (ref 3.87–5.11)
RDW: 19.8 % — ABNORMAL HIGH (ref 11.5–15.5)
WBC Count: 8.2 K/uL (ref 4.0–10.5)
nRBC: 0.8 % — ABNORMAL HIGH (ref 0.0–0.2)

## 2024-01-15 LAB — PREGNANCY, URINE: Preg Test, Ur: NEGATIVE

## 2024-01-15 MED ORDER — SODIUM CHLORIDE 0.9 % IV SOLN
150.0000 mg/m2 | Freq: Once | INTRAVENOUS | Status: AC
  Administered 2024-01-15: 340 mg via INTRAVENOUS
  Filled 2024-01-15: qty 15

## 2024-01-15 MED ORDER — SODIUM CHLORIDE 0.9% FLUSH
10.0000 mL | Freq: Once | INTRAVENOUS | Status: AC
  Administered 2024-01-15: 10 mL

## 2024-01-15 MED ORDER — DEXTROSE 5 % IV SOLN: INTRAVENOUS | Status: DC

## 2024-01-15 MED ORDER — LEUCOVORIN CALCIUM INJECTION 350 MG
400.0000 mg/m2 | Freq: Once | INTRAVENOUS | Status: AC
  Administered 2024-01-15: 900 mg via INTRAVENOUS
  Filled 2024-01-15: qty 45

## 2024-01-15 MED ORDER — CLINDAMYCIN PHOS (TWICE-DAILY) 1 % EX GEL: Freq: Two times a day (BID) | CUTANEOUS | 2 refills | Status: DC

## 2024-01-15 MED ORDER — ATROPINE SULFATE 1 MG/ML IV SOLN
0.5000 mg | Freq: Once | INTRAVENOUS | Status: AC | PRN
  Administered 2024-01-15: 0.5 mg via INTRAVENOUS
  Filled 2024-01-15: qty 1

## 2024-01-15 MED ORDER — SODIUM CHLORIDE 0.9 % IV SOLN
6.0000 mg/kg | Freq: Once | INTRAVENOUS | Status: AC
  Administered 2024-01-15: 700 mg via INTRAVENOUS
  Filled 2024-01-15: qty 15

## 2024-01-15 MED ORDER — OXALIPLATIN CHEMO INJECTION 100 MG/20ML
85.0000 mg/m2 | Freq: Once | INTRAVENOUS | Status: AC
  Administered 2024-01-15: 200 mg via INTRAVENOUS
  Filled 2024-01-15: qty 40

## 2024-01-15 MED ORDER — SODIUM CHLORIDE 0.9 % IV SOLN: Freq: Once | INTRAVENOUS | Status: AC

## 2024-01-15 MED ORDER — DEXAMETHASONE SODIUM PHOSPHATE 10 MG/ML IJ SOLN
10.0000 mg | Freq: Once | INTRAMUSCULAR | Status: AC
  Administered 2024-01-15: 10 mg via INTRAVENOUS
  Filled 2024-01-15: qty 1

## 2024-01-15 MED ORDER — APREPITANT 130 MG/18ML IV EMUL
130.0000 mg | Freq: Once | INTRAVENOUS | Status: AC
  Administered 2024-01-15: 130 mg via INTRAVENOUS
  Filled 2024-01-15: qty 18

## 2024-01-15 MED ORDER — MAGNESIUM SULFATE 2 GM/50ML IV SOLN
2.0000 g | Freq: Once | INTRAVENOUS | Status: AC
  Administered 2024-01-15: 2 g via INTRAVENOUS
  Filled 2024-01-15: qty 50

## 2024-01-15 MED ORDER — SODIUM CHLORIDE 0.9 % IV SOLN
3200.0000 mg/m2 | INTRAVENOUS | Status: DC
  Administered 2024-01-15: 7000 mg via INTRAVENOUS
  Filled 2024-01-15: qty 140

## 2024-01-15 MED ORDER — SODIUM CHLORIDE 0.9% FLUSH: 10.0000 mL | INTRAVENOUS | Status: DC | PRN

## 2024-01-15 MED ORDER — PALONOSETRON HCL INJECTION 0.25 MG/5ML
0.2500 mg | Freq: Once | INTRAVENOUS | Status: AC
  Administered 2024-01-15: 0.25 mg via INTRAVENOUS
  Filled 2024-01-15: qty 5

## 2024-01-15 NOTE — Progress Notes (Signed)
 [facility]   [phone] [phone]   Clinic Follow up Note   Patient Care Team: [name], MD as PCP - General (Family Medicine) [name], [name], RN as Oncology Nurse Navigator [name], MD as Consulting Physician (Gastroenterology) [name], MD as Consulting Physician (Medical Oncology) [name], MD as Consulting Physician (Obstetrics and Gynecology) [name], MD as Consulting Physician (General Surgery) [name], MD as Consulting Physician (General Surgery)  Date of Service:  01/15/2024  CHIEF COMPLAINT: f/u of metastatic colon cancer  CURRENT THERAPY:  FOLFOXIRI every 2 weeks  Oncology History   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 -Colonoscopy showed malignant obstructive tumor in the distal descending colon, biopsy showed moderate differentiated invasive adenocarcinoma. -CT scan showed a 2.6 cm oligo liver metastasis.  Biopsy is pending -Patient was seen by colorectal surgeon Dr. Sheldon on October 10, 2023. -Would recommend neoadjuvant chemotherapy for 3 months, followed by surgery (left hemicolectomy, liver resection), or liver ablation or radiation if she respond well to chemo  -She started chemo FOLFOXIRI on 10/24/2023 -I discussed the NGS Tempus result and recommending Vectibix  to her chemo. She started from cycle 4 chemo  -restaging CT 6/24 showed slightly decreased colon and liver mets, no new lesions.  -Surgeon Dr. Dasie reviewed her case and recommend pt to be referred to [facility] for liver resection    Assessment & Plan Metastatic colon cancer Ongoing chemotherapy treatment with stable blood counts. Hemoglobin level at 10.2, indicating readiness for the next chemotherapy cycle. Transient diarrhea occurred last week but has resolved.  Scheduled for a CT scan on January 26, 2024, to assess disease status. - Schedule an additional cycle of chemotherapy as a contingency if surgery is delayed. - Proceed with the CT scan on January 26, 2024.  Rash due to chemotherapy Facial rash resembling a tan, likely due to chemotherapy. Inconsistent use of clindamycin  gel and hydrocortisone. - Advise using hydrocortisone once daily on the face, neck, and chest, and twice daily if needed. - Ensure she obtains clindamycin  gel from the pharmacy or call in a new prescription if cost is prohibitive. - Advise wearing a hat outdoors to minimize sun exposure.  Diarrhea due to chemotherapy Transient diarrhea resolved. No current issues with diarrhea, nausea, or vomiting.  Peripheral neuropathy Intermittent numbness and tingling, not severe, managed with family assistance for tasks. Overall mild, will continue monitoring.  Plan - Lab reviewed, adequate for treatment, will proceed cycle 7 chemo - She is scheduled to see Dr. Romero at [facility] next Friday - Follow-up in 2 weeks with cycle 8 chemo (okay to cancel if she will have surgery soon)   SUMMARY OF ONCOLOGIC HISTORY: Oncology History  Adenocarcinoma of descending colon (HCC)  09/11/2023 Cancer Staging   Staging form: Colon and Rectum, AJCC 8th Edition - Clinical stage from 09/11/2023: Stage IVA (cTX, cN1, cM1a) - Signed by [name], MD on 10/12/2023   10/01/2023 Initial Diagnosis   Adenocarcinoma of descending colon (HCC)   10/24/2023 -  Chemotherapy   Patient is on Treatment Plan : COLORECTAL FOLFOXIRI q14d      Genetic Testing   Negative CancerNext +RNAinsight. The [employer] CancerNext+RNAinsight Panel includes sequencing, rearrangement analysis, and RNA analysis for the following 39 genes: APC, ATM, BAP1, BARD1, BMPR1A, BRCA1, BRCA2, BRIP1, CDH1, CDKN2A, CHEK2, FH, FLCN, MET, MLH1, MSH2, MSH6,  MUTYH, NF1, NTHL1, PALB2, PMS2, PTEN, RAD51C, RAD51D, SMAD4, STK11, TP53, TSC1, TSC2, and VHL  (sequencing and deletion/duplication); AXIN2, HOXB13, MBD4, MSH3, POLD1 and POLE (sequencing only); EPCAM and GREM1 (deletion/duplication only). Report date 11/04/23.       Discussed the use of AI scribe software for clinical note transcription with the patient, who gave verbal consent to proceed.  History of Present Illness Ashley [name] is a 38 year old female with metastatic colon cancer who presents for follow-up.  She experiences a facial rash resembling a 'bad tan' and uses Vaseline for dryness. She does not use sunscreen and has not been using hydrocortisone regularly. Clindamycin  gel is unavailable. She takes doxycycline , two pills twice a day.  Last week, she had diarrhea from Sunday to Tuesday, which has resolved. She has no current issues with nausea, vomiting, or aches and pains. Occasional numbness and tingling are present but not severe.  She is taking magnesium  and potassium, two pills a day, and reports no issues with her chemotherapy regimen. She notes hair loss but observes some regrowth. Her children are aware of her condition and provide support.     All other systems were reviewed with the patient and are negative.  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   Irregular periods/menstrual cycles 01/30/2012   Late prenatal care 01/30/2012   Poor social situation 01/30/2012   Previous cesarean section complicating pregnancy, antepartum condition or complication 01/27/2016   2 previous cesarean section- Desires repeat with BTL   Single liveborn, born in hospital, delivered by cesarean delivery 08/13/2016   Status post repeat low transverse cesarean section 08/14/2016   Status post tubal ligation at time of delivery, current hosp 08/14/2016    SURGICAL HISTORY: Past Surgical History:  Procedure  Laterality Date   CESAREAN SECTION  2009   CESAREAN SECTION  01/30/2012   Procedure: CESAREAN SECTION;  Surgeon: Ovid DELENA All, MD;  Location: [facility];  Service: Gynecology;  Laterality: N/A;  Repeat C/S   CESAREAN SECTION WITH BILATERAL TUBAL LIGATION Bilateral 08/13/2016   Procedure: CESAREAN SECTION WITH BILATERAL TUBAL LIGATION;  Surgeon: Burnard VEAR Pate, MD;  Location: [facility] BIRTHING SUITES;  Service: Obstetrics;  Laterality: Bilateral;   CHOLECYSTECTOMY N/A 10/16/2013   Procedure: LAPAROSCOPIC CHOLECYSTECTOMY WITH INTRAOPERATIVE CHOLANGIOGRAM;  Surgeon: Donnice POUR. Belinda, MD;  Location: [facility] OR;  Service: General;  Laterality: N/A;   COLONOSCOPY WITH PROPOFOL  N/A 09/11/2023   Procedure: COLONOSCOPY WITH PROPOFOL ;  Surgeon: [name], MD;  Location: [facility];  Service: Gastroenterology;  Laterality: N/A;   DILATION AND EVACUATION N/A 07/28/2015   Procedure: SUCTION, DILATATION AND EVACUATION;  Surgeon: Carlin DELENA Centers, MD;  Location: [facility];  Service: Gynecology;  Laterality: N/A;   ESOPHAGOGASTRODUODENOSCOPY (EGD) WITH PROPOFOL  N/A 09/11/2023   Procedure: ESOPHAGOGASTRODUODENOSCOPY (EGD) WITH PROPOFOL ;  Surgeon: [name], MD;  Location: [facility];  Service: Gastroenterology;  Laterality: N/A;   IR IMAGING GUIDED PORT INSERTION  10/20/2023   IR US  LIVER BIOPSY  10/20/2023   SUBMUCOSAL INJECTION  09/11/2023   Procedure: INJECTION, SUBMUCOSAL;  Surgeon: [name], MD;  Location: [facility];  Service: Gastroenterology;;   TONSILLECTOMY  AGE 51   TONSILLECTOMY AND ADENOIDECTOMY  AGEB 18    I have reviewed the social history and family history with the patient and  they are unchanged from previous note.  ALLERGIES:  is allergic to fruit extracts.  MEDICATIONS:  Current Outpatient Medications  Medication Sig Dispense Refill   amLODipine (NORVASC) 10 MG tablet Take 10 mg by mouth daily.     doxycycline  (VIBRA -TABS) 100 MG tablet Take 1 tablet (100 mg total) by mouth 2 (two) times  daily. 60 tablet 1   hydrochlorothiazide (HYDRODIURIL) 25 MG tablet Take 25 mg by mouth daily.     hyoscyamine  (LEVSIN  SL) 0.125 MG SL tablet Place 1 tablet (0.125 mg total) under the tongue every 6 (six) hours as needed for cramping (nausea, diarrhea). 50 tablet 1   ibuprofen  (ADVIL ) 800 MG tablet Take 800 mg by mouth 3 (three) times daily.     lidocaine -prilocaine  (EMLA ) cream Apply to affected area once 30 g 3   loratadine  (CLARITIN ) 10 MG tablet Take 1 tablet (10 mg total) by mouth daily. 90 tablet 0   magnesium  oxide (MAG-OX) 400 (240 Mg) MG tablet Take 1 tablet (400 mg total) by mouth 2 (two) times daily. 60 tablet 1   meloxicam (MOBIC) 15 MG tablet Take 15 mg by mouth daily.     omeprazole  (PRILOSEC  OTC) 20 MG tablet Take 1 tablet (20 mg total) by mouth daily. 28 tablet 1   ondansetron  (ZOFRAN ) 8 MG tablet Take 1 tablet (8 mg total) by mouth every 8 (eight) hours as needed for nausea or vomiting. Start on the third day after chemotherapy 30 tablet 1   ondansetron  (ZOFRAN -ODT) 4 MG disintegrating tablet Take 1 tablet (4 mg total) by mouth every 8 (eight) hours as needed for nausea or vomiting. 20 tablet 0   potassium chloride  SA (KLOR-CON  M) 20 MEQ tablet TAKE 1 TABLET DAILY. PLEASE FOLLOW UP WITH YOUR PRIMARY CARE DOCTOR REGARDING ADDITIONAL REFILLS. 30 tablet 0   prochlorperazine  (COMPAZINE ) 10 MG tablet Take 1 tablet (10 mg total) by mouth every 6 (six) hours as needed for nausea or vomiting (Nausea or vomiting). 30 tablet 1   clindamycin  (CLINDAGEL) 1 % gel Apply topically 2 (two) times daily. 30 g 2   No current facility-administered medications for this visit.   Facility-Administered Medications Ordered in Other Visits  Medication Dose Route Frequency Provider Last Rate Last Admin   aprepitant  (CINVANTI ) injection 130 mg  130 mg Intravenous Once [name], MD       dexamethasone  (DECADRON ) injection 10 mg  10 mg Intravenous Once [name], MD       dextrose  5 % solution   Intravenous  Continuous [name], MD       fluorouracil  (ADRUCIL ) 7,000 mg in sodium chloride  0.9 % 110 mL chemo infusion  3,200 mg/m2 (Treatment Plan Recorded) Intravenous 1 day or 1 dose [name], MD       irinotecan  (CAMPTOSAR ) 340 mg in sodium chloride  0.9 % 500 mL chemo infusion  150 mg/m2 (Treatment Plan Recorded) Intravenous Once [name], MD       leucovorin  900 mg in dextrose  5 % 250 mL infusion  400 mg/m2 (Treatment Plan Recorded) Intravenous Once [name], MD       magnesium  sulfate IVPB 2 g 50 mL  2 g Intravenous Once [name], MD 50 mL/hr at 01/15/24 0855 2 g at 01/15/24 0855   oxaliplatin  (ELOXATIN ) 200 mg in dextrose  5 % 500 mL chemo infusion  85 mg/m2 (Treatment Plan Recorded) Intravenous Once [name], MD       palonosetron  (ALOXI ) injection 0.25 mg  0.25 mg Intravenous Once  [name], MD       panitumumab  (VECTIBIX ) 700 mg in sodium chloride  0.9 % 100 mL chemo infusion  6 mg/kg (Treatment Plan Recorded) Intravenous Once [name], MD       sodium chloride  flush (NS) 0.9 % injection 10 mL  10 mL Intracatheter PRN [name], MD        PHYSICAL EXAMINATION: ECOG PERFORMANCE STATUS: 1 - Symptomatic but completely ambulatory  Vitals:   01/15/24 0809 01/15/24 0813  BP: (!) 160/100 (!) 150/100  Pulse:  85  Resp:  17  Temp:  99.1 F (37.3 C)  SpO2:  99%   Wt Readings from Last 3 Encounters:  01/15/24 112.6 kg (248 lb 4.8 oz)  01/01/24 110.4 kg (243 lb 4.8 oz)  12/05/23 110.6 kg (243 lb 12 oz)     GENERAL:alert, no distress and comfortable SKIN: skin color, texture, turgor are normal, diffuse acne-like rash on face, neck and upper chest with dry skin EYES: normal, Conjunctiva are pink and non-injected, sclera clear NECK: supple, thyroid  normal size, non-tender, without nodularity LYMPH:  no palpable lymphadenopathy in the cervical, axillary  LUNGS: clear to auscultation and percussion with normal breathing effort HEART: regular rate & rhythm and no murmurs and no lower extremity  edema ABDOMEN:abdomen soft, non-tender and normal bowel sounds Musculoskeletal:no cyanosis of digits and no clubbing  NEURO: alert & oriented x 3 with fluent speech, no focal motor/sensory deficits  Physical Exam SKIN: Rash on face  LABORATORY DATA:  I have reviewed the data as listed    Latest Ref Rng & Units 01/15/2024    7:43 AM 01/01/2024    7:58 AM 12/18/2023    9:05 AM  CBC  WBC 4.0 - 10.5 K/uL 8.2  8.8  5.8   Hemoglobin 12.0 - 15.0 g/dL 89.7  89.3  89.8   Hematocrit 36.0 - 46.0 % 32.6  33.1  31.9   Platelets 150 - 400 K/uL 141  138  122         Latest Ref Rng & Units 01/15/2024    7:43 AM 01/01/2024    7:58 AM 12/18/2023    9:05 AM  CMP  Glucose 70 - 99 mg/dL 892  99  891   BUN 6 - 20 mg/dL <5  5  <5   Creatinine 0.44 - 1.00 mg/dL 9.44  9.49  9.50   Sodium 135 - 145 mmol/L 142  142  141   Potassium 3.5 - 5.1 mmol/L 3.5  3.0  3.2   Chloride 98 - 111 mmol/L 106  104  103   CO2 22 - 32 mmol/L 31  29  31    Calcium  8.9 - 10.3 mg/dL 8.7  9.1  8.9   Total Protein 6.5 - 8.1 g/dL 6.5  7.1  6.9   Total Bilirubin 0.0 - 1.2 mg/dL 0.3  0.3  0.3   Alkaline Phos 38 - 126 U/L 107  108  99   AST 15 - 41 U/L 17  18  15    ALT 0 - 44 U/L 17  18  20        RADIOGRAPHIC STUDIES: I have personally reviewed the radiological images as listed and agreed with the findings in the report. No results found.    Orders Placed This Encounter  Procedures   CBC with Differential (Cancer Center Only)    Standing Status:   Future    Expected Date:   01/29/2024    Expiration Date:   01/28/2025  CMP (Cancer Center only)    Standing Status:   Future    Expected Date:   01/29/2024    Expiration Date:   01/28/2025   All questions were answered. The patient knows to call the clinic with any problems, questions or concerns. No barriers to learning was detected. The total time spent in the appointment was 25 minutes, including review of chart and various tests results, discussions about plan of care  and coordination of care plan     Onita Mattock, MD 01/15/2024

## 2024-01-15 NOTE — Patient Instructions (Signed)
 [facility] - A DEPT OF [facility]  Discharge Instructions: Thank you for choosing [facility] to provide your oncology and hematology care.   If you have a lab appointment with the Cancer Center, please go directly to the Cancer Center and check in at the registration area.   Wear comfortable clothing and clothing appropriate for easy access to any Portacath or PICC line.   We strive to give you quality time with your provider. You may need to reschedule your appointment if you arrive late (15 or more minutes).  Arriving late affects you and other patients whose appointments are after yours.  Also, if you miss three or more appointments without notifying the office, you may be dismissed from the clinic at the provider's discretion.      For prescription refill requests, have your pharmacy contact our office and allow 72 hours for refills to be completed.    Today you received the following chemotherapy and/or immunotherapy agents: Vectibix /Irinotecan /Oxaliplatin /Leucovorin /5FU      To help prevent nausea and vomiting after your treatment, we encourage you to take your nausea medication as directed.  BELOW ARE SYMPTOMS THAT SHOULD BE REPORTED IMMEDIATELY: *FEVER GREATER THAN 100.4 F (38 C) OR HIGHER *CHILLS OR SWEATING *NAUSEA AND VOMITING THAT IS NOT CONTROLLED WITH YOUR NAUSEA MEDICATION *UNUSUAL SHORTNESS OF BREATH *UNUSUAL BRUISING OR BLEEDING *URINARY PROBLEMS (pain or burning when urinating, or frequent urination) *BOWEL PROBLEMS (unusual diarrhea, constipation, pain near the anus) TENDERNESS IN MOUTH AND THROAT WITH OR WITHOUT PRESENCE OF ULCERS (sore throat, sores in mouth, or a toothache) UNUSUAL RASH, SWELLING OR PAIN  UNUSUAL VAGINAL DISCHARGE OR ITCHING   Items with * indicate a potential emergency and should be followed up as soon as possible or go to the Emergency Department if any problems should occur.  Please show the  CHEMOTHERAPY ALERT CARD or IMMUNOTHERAPY ALERT CARD at check-in to the Emergency Department and triage nurse.  Should you have questions after your visit or need to cancel or reschedule your appointment, please contact [facility] - A DEPT OF [facility]  Dept: [phone]  and follow the prompts.  Office hours are 8:00 a.m. to 4:30 p.m. Monday - Friday. Please note that voicemails left after 4:00 p.m. may not be returned until the following business day.  We are closed weekends and major holidays. You have access to a nurse at all times for urgent questions. Please call the main number to the clinic Dept: [phone] and follow the prompts.   For any non-urgent questions, you may also contact your provider using MyChart. We now offer e-Visits for anyone 9 and older to request care online for non-urgent symptoms. For details visit [email or web address].   Also download the MyChart app! Go to the app store, search MyChart, open the app, select [facility], and log in with your MyChart username and password.

## 2024-01-15 NOTE — Progress Notes (Signed)
 Spoke with Particia at [facility] [phone] and requested if the images for the following could be pushed to [facility] in [place]. Request for all pt's recent radiology test images to be sent to [facility]. Particia stated she will have them sent to them before the close of business today.

## 2024-01-17 ENCOUNTER — Ambulatory Visit

## 2024-01-17 VITALS — BP 116/72 | HR 88 | Temp 98.6°F | Resp 17

## 2024-01-17 DIAGNOSIS — Z79634 Long term (current) use of topoisomerase inhibitor: Secondary | ICD-10-CM | POA: Diagnosis not present

## 2024-01-17 DIAGNOSIS — C186 Malignant neoplasm of descending colon: Secondary | ICD-10-CM

## 2024-01-17 DIAGNOSIS — C19 Malignant neoplasm of rectosigmoid junction: Secondary | ICD-10-CM | POA: Diagnosis not present

## 2024-01-17 DIAGNOSIS — C787 Secondary malignant neoplasm of liver and intrahepatic bile duct: Secondary | ICD-10-CM | POA: Diagnosis not present

## 2024-01-17 DIAGNOSIS — Z5111 Encounter for antineoplastic chemotherapy: Secondary | ICD-10-CM | POA: Diagnosis not present

## 2024-01-17 DIAGNOSIS — Z5112 Encounter for antineoplastic immunotherapy: Secondary | ICD-10-CM | POA: Diagnosis not present

## 2024-01-17 DIAGNOSIS — Z79631 Long term (current) use of antimetabolite agent: Secondary | ICD-10-CM | POA: Diagnosis not present

## 2024-01-17 DIAGNOSIS — Z95828 Presence of other vascular implants and grafts: Secondary | ICD-10-CM

## 2024-01-17 DIAGNOSIS — Z79899 Other long term (current) drug therapy: Secondary | ICD-10-CM | POA: Diagnosis not present

## 2024-01-17 DIAGNOSIS — Z5189 Encounter for other specified aftercare: Secondary | ICD-10-CM | POA: Diagnosis not present

## 2024-01-17 DIAGNOSIS — Z79630 Long term (current) use of alkylating agent: Secondary | ICD-10-CM | POA: Diagnosis not present

## 2024-01-17 DIAGNOSIS — K56699 Other intestinal obstruction unspecified as to partial versus complete obstruction: Secondary | ICD-10-CM | POA: Diagnosis not present

## 2024-01-17 MED ORDER — HEPARIN SOD (PORK) LOCK FLUSH 100 UNIT/ML IV SOLN
500.0000 [IU] | Freq: Once | INTRAVENOUS | Status: AC
Start: 2024-01-17 — End: 2024-01-17
  Administered 2024-01-17: 500 [IU]

## 2024-01-17 MED ORDER — SODIUM CHLORIDE 0.9% FLUSH
10.0000 mL | Freq: Once | INTRAVENOUS | Status: AC
Start: 2024-01-17 — End: 2024-01-17
  Administered 2024-01-17: 10 mL

## 2024-01-17 MED ORDER — PEGFILGRASTIM-CBQV 6 MG/0.6ML ~~LOC~~ SOSY
6.0000 mg | PREFILLED_SYRINGE | Freq: Once | SUBCUTANEOUS | Status: AC
  Administered 2024-01-17: 6 mg via SUBCUTANEOUS
  Filled 2024-01-17: qty 0.6

## 2024-01-18 ENCOUNTER — Telehealth: Payer: Self-pay | Admitting: Hematology

## 2024-01-18 NOTE — Telephone Encounter (Signed)
 Scheduled appointments per WQ. Talked with the patient and she is aware of the made appointments.

## 2024-01-25 ENCOUNTER — Other Ambulatory Visit: Payer: Self-pay

## 2024-01-26 DIAGNOSIS — C787 Secondary malignant neoplasm of liver and intrahepatic bile duct: Secondary | ICD-10-CM | POA: Diagnosis not present

## 2024-01-26 DIAGNOSIS — C186 Malignant neoplasm of descending colon: Secondary | ICD-10-CM | POA: Diagnosis not present

## 2024-01-26 DIAGNOSIS — E042 Nontoxic multinodular goiter: Secondary | ICD-10-CM | POA: Diagnosis not present

## 2024-01-27 NOTE — Assessment & Plan Note (Deleted)
-  Stage IV with oligometastasis, MSS, HER2 (+), UGT1A1 (+), KRAS/NRAS/BRAF wild type -Presented with abdominal pain, change of bowel habits, and a 50 pound weight loss. -Colonoscopy showed malignant obstructive tumor in the distal descending colon, biopsy showed moderate differentiated invasive adenocarcinoma. -CT scan showed a 2.6 cm oligo liver metastasis.  Biopsy is pending -Patient was seen by colorectal surgeon Dr. Sheldon on October 10, 2023. -Would recommend neoadjuvant chemotherapy for 3 months, followed by surgery (left hemicolectomy, liver resection), or liver ablation or radiation if she respond well to chemo  -She started chemo FOLFOXIRI on 10/24/2023 -I discussed the NGS Tempus result and recommending Vectibix  to her chemo. She started from cycle 4 chemo  -restaging CT 6/24 showed slightly decreased colon and liver mets, no new lesions.  -Surgeon Dr. Dasie reviewed her case and recommend pt to be referred to [facility] for liver resection. She was seen by Dr. Romero on 01/26/24

## 2024-01-29 ENCOUNTER — Inpatient Hospital Stay

## 2024-01-29 ENCOUNTER — Inpatient Hospital Stay: Admitting: Hematology

## 2024-01-29 ENCOUNTER — Telehealth: Payer: Self-pay

## 2024-01-29 ENCOUNTER — Encounter

## 2024-01-29 DIAGNOSIS — C186 Malignant neoplasm of descending colon: Secondary | ICD-10-CM

## 2024-01-29 NOTE — Telephone Encounter (Signed)
 Called patient due to not showing up for her 730 lab app and 800 app with Dr. Lanny. Patient stated her Surgeon Dr. Romero instructed her to stop chemo until her surgery on 8/21 and she thought they would have contacted the office to cancel. Will make Dr. Lanny aware of what Surgeon suggested.

## 2024-01-30 DIAGNOSIS — J9611 Chronic respiratory failure with hypoxia: Secondary | ICD-10-CM | POA: Diagnosis not present

## 2024-01-30 DIAGNOSIS — G4733 Obstructive sleep apnea (adult) (pediatric): Secondary | ICD-10-CM | POA: Diagnosis not present

## 2024-01-30 NOTE — Procedures (Signed)
Result scanned to media

## 2024-01-31 ENCOUNTER — Encounter

## 2024-02-01 NOTE — Progress Notes (Signed)
 [facility] - Initial Assessment (Telephone)  Ashley [name] is a 38 y.o. female who is being seen for PO[facility] Nutrition Assessment  Reason for visit: Referred for perioperative nutrition evaluation and optimization due to low albumin*  *Note - Albumin & pre-albumin are not used as nutrition markers, as they are part of a negative acute-phase response, and not directly indicative of nutrition status. Low albumin and pre-albumin values may be indicative of morbidity and mortality rather than clinical malnutrition. Nutrition status to be evaluated by RD.   Pending Surgery: HEPATECTOMY, RESECTION OF LIVER; PARTIAL LOBECTOMY; LAPAROSCOPY, SURGICAL; ABDOMEN, PERITONEUM, AND OMENTUM, DIAGNOSTIC, WITH OR WITHOUT COLLECTION OF SPECIMEN(S) BY BRUSHING OR WASHING (SEPARATE PROCEDURE  Surgeon: Romero Toribio Mungo, MD  Surgery Date: 02/22/24 Referring Provider: Sherrod Zelia Bean, MD (PASS pre-screening)  Medical Nutrition Therapy provided to: patient, via phone Counseling Type: initial Time spent via phone with patient: 30 minutes   PeriOperative Nutrition Screen (PONS) BMI less than 18.5 in adults under age 66 OR BMI less than 20 in adults over age 74 No  Loss of >10% weight loss within the last 6 months? No  Eating < 50% of usual? No  Albumin < 3.5 g/dL Yes    Assessment   Patient Active Problem List  Diagnosis  . Adenocarcinoma of descending colon (CMS/HHS-HCC)  . Colonic stricture (CMS/HHS-HCC)  . Liver mass  . Obesity, Class III, BMI 40-49.9 (morbid obesity)  . Thyromegaly  . Abdominal pain  . Fatty liver disease, nonalcoholic  . Colon cancer metastasized to liver (CMS/HHS-HCC)   No past medical history on file. Past Surgical History:  Procedure Laterality Date  . CHOLECYSTECTOMY     Anthropometrics: Estimated body mass index is 41.64 kg/m as calculated from the following:   Height as of 01/26/24: 162.6 cm (5' 4.02).   Weight as of 01/26/24: 110.1 kg (242 lb 11.6  oz).  Weight History: Wt Readings from Last 20 Encounters:  01/26/24 (!) 110.1 kg (242 lb 11.6 oz)  10/10/23 (!) 114.8 kg (253 lb)  09/11/23: 245 lb (Care Everywhere)   IBW: 54.5 kg  Adjusted body weight: 68.2 kg   Nutrition Related Labs and Medications: Medications: dulcolax, doxycycline , prilosec , zofran , klor-con   Vitamins/Supplements: magnesium  oxide    01/26/24 12:39  Sodium 137  Potassium 3.0 (L)  Chloride 104  BUN <5 (L)  Creatinine 0.6  Glucose 77  Calcium  8.9  AST 22  ALT 19  WBC 6.5  Hemoglobin 10.3 (L)  Hematocrit 33.1 (L)   Estimated Nutritional Needs: For surgical optimization  Energy: [phone] kcal/d (30-35 kcal/kg per adjusted body weight)  Protein: 132 g/d (1.2 g/kg per actual body weight)  Fluids: 1 mL/Kcal or per MD discretion Physical Activity: active, walks, ADL's   Nutrition-Focused Findings: Physical Assessment: other (comment) (Unable to assess, telephone visit) (02/07/24 1000) Hand Grip Strength: UTA Function Status/ Activity: ADL's Micronutrients: No deficiencies identified per verbal assessment   Malnutrition Assessment: Diagnosis: insufficient evidence available to diagnose malnutrition (Unable to diagnose without NFPE, pt otherwise does not meet criteria) (02/07/24 1000)    Nutrition History and Current Intake: PMH: Pt with colon cancer and liver metastasis; Hx of chemo, Her last chemo was 7/14;  Followed by [place] onc RD - last seen 11/2023 Food allergies: none  Eating patterns: eats anything, generally eating less red meat, 2 meals per day + snacks, she is making an effort to eat healthier including more whole grains and limiting frying  Activity: walks, takes kids places, not bed bound  Recall:  Breakfast  Skips   Lunch  May have cereal or oatmeal or chicken  Dinner  Pork tenderloin with rice  Snacks Yogurt, crackers   Beverages  Water and occasional soda and juice    Evaluation  Ashley [name] is referred for  nutrition assessment and optimization in anticipation of upcoming surgeries. Per patient and EMR documentation, plan is to cancel 8/21 hepatectomy surgery and proceed with colon resection surgery first.   She endorses significant weight loss last year prior to diagnosis/chemo, but has been relatively weight stable over the last few months. At present she has a good appetite, intake meeting estimated needs per recall. She remains active and is motivated to prepare her body for surgery. She denies difficulty with chewing/swallowing, taste changes, nausea, vomiting, diarrhea, constipation or difficulty with access to food.   Nutrition Diagnosis   -NI-5.1 Increased nutrient needs (protein) related to increased metabolic demand for healing and recovery as evidenced by pending surgical procedure  Nutrition Intervention    Nutrition Education: Reviewed the role of nutrition in context of surgery. Discussed importance of high quality protein, hydration, and micronutrient needs for optimization. Reviewed strategies and resources to meet estimated needs. All questions answered.    Materials Provided: (via email)  Protein + surgery handout  List of protein shake options    Goals and Recommendations: - Maintain adequate nutritional intake to prevent unplanned weight loss perioperatively  - Increase protein intake by incorporating protein-rich foods at meals and snacks. Consider using high-protein oral nutrition supplements (e.g., Ensure Max Protein or Premier Protein) 2-3 x daily to help meet protein needs. - Emphasized the importance of a balanced diet, including a variety of fruits and vegetables, to support immune function. - Maintain adequate hydration, at least 64 oz daily unless otherwise directed - Encouraged muscle strengthening activities as tolerated to maintain lean body mass - Encourage early post-operative feeding as permitted   Monitoring and Follow Up   Monitor PO intake and weight  trend. Will continue to follow patient perioperatively.  Provided RD phone number and e-mail for questions/concerns.  Total Time Spent: 70 Minute(s)   Mardy Novak, MS, RD, LDN  Phone: [phone] Pager: [phone]  This telephone encounter was conducted with the patient's (or proxy's) verbal consent via secure, interactive audio telecommunications while in clinic/office/hospital.  The patient (or proxy) was instructed to have this encounter in a suitably private space and to only have persons present to whom they give permission to participate. In addition, the patient identity was confirmed by use of name plus two identifiers.

## 2024-02-05 ENCOUNTER — Ambulatory Visit: Payer: Self-pay | Admitting: Surgery

## 2024-02-05 DIAGNOSIS — R739 Hyperglycemia, unspecified: Secondary | ICD-10-CM

## 2024-02-07 ENCOUNTER — Other Ambulatory Visit: Payer: Self-pay | Admitting: Nurse Practitioner

## 2024-02-07 ENCOUNTER — Other Ambulatory Visit: Payer: Self-pay

## 2024-02-07 DIAGNOSIS — C186 Malignant neoplasm of descending colon: Secondary | ICD-10-CM

## 2024-02-07 DIAGNOSIS — Z008 Encounter for other general examination: Secondary | ICD-10-CM | POA: Diagnosis not present

## 2024-02-07 MED ORDER — POTASSIUM CHLORIDE CRYS ER 20 MEQ PO TBCR: 20.0000 meq | EXTENDED_RELEASE_TABLET | Freq: Every day | ORAL | 0 refills | Status: DC

## 2024-02-08 NOTE — Progress Notes (Incomplete)
 COVID Vaccine received:  []  No [x]  Yes Date of any COVID positive Test in last 90 days: no PCP - Arthur Reese MD Cardiologist - n/a  Chest x-ray - CT chest 09/29/23 Epic EKG -   Stress Test -  ECHO -  Cardiac Cath -   Bowel Prep - [x]  No  []   Yes ______  Pacemaker / ICD device [x]  No []  Yes   Spinal Cord Stimulator:[x]  No []  Yes       History of Sleep Apnea? []  No [x]  Yes   CPAP used?- [x]  No []  Yes    Does the patient monitor blood sugar?          [x]  No []  Yes  []  N/A  Patient has: [x]  NO Hx DM   []  Pre-DM                 []  DM1  []   DM2 Does patient have a [employer] or [employer]om? []  No []  Yes   Fasting Blood Sugar Ranges-  Checks Blood Sugar _____ times a day  GLP1 agonist / usual dose - no GLP1 instructions:  SGLT-2 inhibitors / usual dose - no SGLT-2 instructions:   Blood Thinner / Instructions:no Aspirin Instructions:no  Comments:   Activity level: Patient is able  to climb a flight of stairs without difficulty; [x]  No CP  [x]  No SOB,    Patient can perform ADLs without assistance.   Anesthesia review:   Patient denies shortness of breath, fever, cough and chest pain at PAT appointment.  Patient verbalized understanding and agreement to the Pre-Surgical Instructions that were given to them at this PAT appointment. Patient was also educated of the need to review these PAT instructions again prior to his/her surgery.I reviewed the appropriate phone numbers to call if they have any and questions or concerns.

## 2024-02-08 NOTE — Patient Instructions (Addendum)
 SURGICAL WAITING ROOM VISITATION  Patients having surgery or a procedure may have no more than 2 support people in the waiting area - these visitors may rotate.    Children under the age of 51 must have an adult with them who is not the patient.  Visitors with respiratory illnesses are discouraged from visiting and should remain at home.  If the patient needs to stay at the hospital during part of their recovery, the visitor guidelines for inpatient rooms apply. Pre-op nurse will coordinate an appropriate time for 1 support person to accompany patient in pre-op.  This support person may not rotate.    Please refer to the [facility] website for the visitor guidelines for Inpatients (after your surgery is over and you are in a regular room).       Your procedure is scheduled on: 02/16/24   Report to [facility] Main Entrance    Report to admitting at 10:15 AM   Call this number if you have problems the morning of surgery [phone]   Do not eat food :After Midnight.   After Midnight you may have the following liquids until 9:30 AM DAY OF SURGERY  Water Non-Citrus Juices (without pulp, NO RED-Apple, White grape, White cranberry) Black Coffee (NO MILK/CREAM OR CREAMERS, sugar ok)  Clear Tea (NO MILK/CREAM OR CREAMERS, sugar ok) regular and decaf                             Plain Jell-O (NO RED)                                           Fruit ices (not with fruit pulp, NO RED)                                     Popsicles (NO RED)                                                               Sports drinks like Gatorade (NO RED)              Drink 2 Ensure/G2 drinks AT 10:00 PM the night before surgery.        The day of surgery:  Drink ONE (1) Pre-Surgery Clear Ensure at 9:30 AM the morning of surgery. Drink in one sitting. Do not sip.  This drink was given to you during your hospital  pre-op appointment visit. Nothing else to drink after completing the  Pre-Surgery  Clear Ensure.          If you have questions, please contact your surgeon's office.   FOLLOW BOWEL PREP AND ANY ADDITIONAL PRE OP INSTRUCTIONS YOU RECEIVED FROM YOUR SURGEON'S OFFICE!!!     Oral Hygiene is also important to reduce your risk of infection.                                    Remember - BRUSH YOUR TEETH THE MORNING OF SURGERY WITH  YOUR REGULAR TOOTHPASTE  DENTURES WILL BE REMOVED PRIOR TO SURGERY PLEASE DO NOT APPLY Poly grip OR ADHESIVES!!!   Stop all vitamins and herbal supplements 7 days before surgery.   Take these medicines the morning of surgery with A SIP OF WATER: Loratadine (claritin ), Omeprazole (prilosec ), Potassium  Bring CPAP mask and tubing day of surgery.                              You may not have any metal on your body including hair pins, jewelry, and body piercing             Do not wear make-up, lotions, powders, perfumes/cologne, or deodorant  Do not wear nail polish including gel and S&S, artificial/acrylic nails, or any other type of covering on natural nails including finger and toenails. If you have artificial nails, gel coating, etc. that needs to be removed by a nail salon please have this removed prior to surgery or surgery may need to be canceled/ delayed if the surgeon/ anesthesia feels like they are unable to be safely monitored.   Do not shave  48 hours prior to surgery.            Do not bring valuables to the hospital. [facility] IS NOT             RESPONSIBLE   FOR VALUABLES.   Contacts, glasses, dentures or bridgework may not be worn into surgery.   Bring small overnight bag day of surgery.   DO NOT BRING YOUR HOME MEDICATIONS TO THE HOSPITAL. PHARMACY WILL DISPENSE MEDICATIONS LISTED ON YOUR MEDICATION LIST TO YOU DURING YOUR ADMISSION IN THE HOSPITAL!    Patients discharged on the day of surgery will not be allowed to drive home.  Someone NEEDS to stay with you for the first 24 hours after anesthesia.   Special Instructions:  Bring a copy of your healthcare power of attorney and living will documents the day of surgery if you haven't scanned them before.              Please read over the following fact sheets you were given: IF YOU HAVE QUESTIONS ABOUT YOUR PRE-OP INSTRUCTIONS PLEASE CALL [phone] [place]   If you received a COVID test during your pre-op visit  it is requested that you wear a mask when out in public, stay away from anyone that may not be feeling well and notify your surgeon if you develop symptoms. If you test positive for Covid or have been in contact with anyone that has tested positive in the last 10 days please notify you surgeon.    [facility] - Preparing for Surgery Before surgery, you can play an important role.  Because skin is not sterile, your skin needs to be as free of germs as possible.  You can reduce the number of germs on your skin by washing with CHG (chlorahexidine gluconate) soap before surgery.  CHG is an antiseptic cleaner which kills germs and bonds with the skin to continue killing germs even after washing. Please DO NOT use if you have an allergy to CHG or antibacterial soaps.  If your skin becomes reddened/irritated stop using the CHG and inform your nurse when you arrive at Short Stay. Do not shave (including legs and underarms) for at least 48 hours prior to the first CHG shower.  You may shave your face/neck.  Please follow these instructions carefully:  1.  Shower with  CHG Soap the night before surgery and the  morning of surgery.  2.  If you choose to wash your hair, wash your hair first as usual with your normal  shampoo.  3.  After you shampoo, rinse your hair and body thoroughly to remove the shampoo.                             4.  Use CHG as you would any other liquid soap.  You can apply chg directly to the skin and wash.  Gently with a scrungie or clean washcloth.  5.  Apply the CHG Soap to your body ONLY FROM THE NECK DOWN.   Do   not use on face/ open                            Wound or open sores. Avoid contact with eyes, ears mouth and   genitals (private parts).                       Wash face,  Genitals (private parts) with your normal soap.             6.  Wash thoroughly, paying special attention to the area where your    surgery  will be performed.  7.  Thoroughly rinse your body with warm water from the neck down.  8.  DO NOT shower/wash with your normal soap after using and rinsing off the CHG Soap.                9.  Pat yourself dry with a clean towel.            10.  Wear clean pajamas.            11.  Place clean sheets on your bed the night of your first shower and do not  sleep with pets. Day of Surgery : Do not apply any lotions/deodorants the morning of surgery.  Please wear clean clothes to the hospital/surgery center.  FAILURE TO FOLLOW THESE INSTRUCTIONS MAY RESULT IN THE CANCELLATION OF YOUR SURGERY  _Incentive Spirometer  An incentive spirometer is a tool that can help keep your lungs clear and active. This tool measures how well you are filling your lungs with each breath. Taking long deep breaths may help reverse or decrease the chance of developing breathing (pulmonary) problems (especially infection) following: A long period of time when you are unable to move or be active. BEFORE THE PROCEDURE  If the spirometer includes an indicator to show your best effort, your nurse or respiratory therapist will set it to a desired goal. If possible, sit up straight or lean slightly forward. Try not to slouch. Hold the incentive spirometer in an upright position. INSTRUCTIONS FOR USE  Sit on the edge of your bed if possible, or sit up as far as you can in bed or on a chair. Hold the incentive spirometer in an upright position. Breathe out normally. Place the mouthpiece in your mouth and seal your lips tightly around it. Breathe in slowly and as deeply as possible, raising the piston or the ball toward the top of the column. Hold your  breath for 3-5 seconds or for as long as possible. Allow the piston or ball to fall to the bottom of the column. Remove the mouthpiece from your mouth and breathe out normally. Rest  for a few seconds and repeat Steps 1 through 7 at least 10 times every 1-2 hours when you are awake. Take your time and take a few normal breaths between deep breaths. The spirometer may include an indicator to show your best effort. Use the indicator as a goal to work toward during each repetition. After each set of 10 deep breaths, practice coughing to be sure your lungs are clear. If you have an incision (the cut made at the time of surgery), support your incision when coughing by placing a pillow or rolled up towels firmly against it. Once you are able to get out of bed, walk around indoors and cough well. You may stop using the incentive spirometer when instructed by your caregiver.  RISKS AND COMPLICATIONS Take your time so you do not get dizzy or light-headed. If you are in pain, you may need to take or ask for pain medication before doing incentive spirometry. It is harder to take a deep breath if you are having pain. AFTER USE Rest and breathe slowly and easily. It can be helpful to keep track of a log of your progress. Your caregiver can provide you with a simple table to help with this. If you are using the spirometer at home, follow these instructions: SEEK MEDICAL CARE IF:  You are having difficultly using the spirometer. You have trouble using the spirometer as often as instructed. Your pain medication is not giving enough relief while using the spirometer. You develop fever of 100.5 F (38.1 C) or higher. SEEK IMMEDIATE MEDICAL CARE IF:  You cough up bloody sputum that had not been present before. You develop fever of 102 F (38.9 C) or greater. You develop worsening pain at or near the incision site. MAKE SURE YOU:  Understand these instructions. Will watch your condition. Will get help right  away if you are not doing well or get worse. Document Released: 10/31/2006 Document Revised: 09/12/2011 Document Reviewed: 01/01/2007   WHAT IS A BLOOD TRANSFUSION? Blood Transfusion Information  A transfusion is the replacement of blood or some of its parts. Blood is made up of multiple cells which provide different functions. Red blood cells carry oxygen  and are used for blood loss replacement. White blood cells fight against infection. Platelets control bleeding. Plasma helps clot blood. Other blood products are available for specialized needs, such as hemophilia or other clotting disorders. BEFORE THE TRANSFUSION  Who gives blood for transfusions?  Healthy volunteers who are fully evaluated to make sure their blood is safe. This is blood bank blood. Transfusion therapy is the safest it has ever been in the practice of medicine. Before blood is taken from a donor, a complete history is taken to make sure that person has no history of diseases nor engages in risky social behavior (examples are intravenous drug use or sexual activity with multiple partners). The donor's travel history is screened to minimize risk of transmitting infections, such as malaria. The donated blood is tested for signs of infectious diseases, such as HIV and hepatitis. The blood is then tested to be sure it is compatible with you in order to minimize the chance of a transfusion reaction. If you or a relative donates blood, this is often done in anticipation of surgery and is not appropriate for emergency situations. It takes many days to process the donated blood. RISKS AND COMPLICATIONS Although transfusion therapy is very safe and saves many lives, the main dangers of transfusion include:  Getting an infectious disease. Developing  a transfusion reaction. This is an allergic reaction to something in the blood you were given. Every precaution is taken to prevent this. The decision to have a blood transfusion has been  considered carefully by your caregiver before blood is given. Blood is not given unless the benefits outweigh the risks. AFTER THE TRANSFUSION Right after receiving a blood transfusion, you will usually feel much better and more energetic. This is especially true if your red blood cells have gotten low (anemic). The transfusion raises the level of the red blood cells which carry oxygen , and this usually causes an energy increase. The nurse administering the transfusion will monitor you carefully for complications. HOME CARE INSTRUCTIONS  No special instructions are needed after a transfusion. You may find your energy is better. Speak with your caregiver about any limitations on activity for underlying diseases you may have. SEEK MEDICAL CARE IF:  Your condition is not improving after your transfusion. You develop redness or irritation at the intravenous (IV) site. SEEK IMMEDIATE MEDICAL CARE IF:  Any of the following symptoms occur over the next 12 hours: Shaking chills. You have a temperature by mouth above 102 F (38.9 C), not controlled by medicine. Chest, back, or muscle pain. People around you feel you are not acting correctly or are confused. Shortness of breath or difficulty breathing. Dizziness and fainting. You get a rash or develop hives. You have a decrease in urine output. Your urine turns a dark color or changes to pink, red, or brown. Any of the following symptoms occur over the next 10 days: You have a temperature by mouth above 102 F (38.9 C), not controlled by medicine. Shortness of breath. Weakness after normal activity. The white part of the eye turns yellow (jaundice). You have a decrease in the amount of urine or are urinating less often. Your urine turns a dark color or changes to pink, red, or brown. Document Released: 06/17/2000 Document Revised: 09/12/2011 Document Reviewed: 02/04/2008 [facility] Patient Information 2014 [place], [place].

## 2024-02-09 ENCOUNTER — Encounter (HOSPITAL_COMMUNITY)
Admission: RE | Admit: 2024-02-09 | Discharge: 2024-02-09 | Disposition: A | Discharge status: Home / Self Care | Source: Ambulatory Visit | Attending: Surgery | Admitting: Surgery

## 2024-02-09 ENCOUNTER — Ambulatory Visit (INDEPENDENT_AMBULATORY_CARE_PROVIDER_SITE_OTHER): Admitting: Internal Medicine

## 2024-02-09 ENCOUNTER — Encounter (HOSPITAL_COMMUNITY): Payer: Self-pay

## 2024-02-09 ENCOUNTER — Other Ambulatory Visit: Payer: Self-pay

## 2024-02-09 ENCOUNTER — Encounter: Payer: Self-pay | Admitting: Internal Medicine

## 2024-02-09 ENCOUNTER — Ambulatory Visit: Payer: Self-pay | Admitting: Internal Medicine

## 2024-02-09 VITALS — BP 124/80 | HR 82 | Ht 64.0 in | Wt 261.0 lb

## 2024-02-09 VITALS — BP 168/97 | HR 83 | Temp 98.4°F | Resp 18 | Ht 64.0 in | Wt 261.0 lb

## 2024-02-09 DIAGNOSIS — R739 Hyperglycemia, unspecified: Secondary | ICD-10-CM | POA: Diagnosis not present

## 2024-02-09 DIAGNOSIS — E010 Iodine-deficiency related diffuse (endemic) goiter: Secondary | ICD-10-CM | POA: Diagnosis not present

## 2024-02-09 DIAGNOSIS — Z01812 Encounter for preprocedural laboratory examination: Secondary | ICD-10-CM | POA: Diagnosis not present

## 2024-02-09 DIAGNOSIS — Z01818 Encounter for other preprocedural examination: Secondary | ICD-10-CM

## 2024-02-09 DIAGNOSIS — E0590 Thyrotoxicosis, unspecified without thyrotoxic crisis or storm: Secondary | ICD-10-CM

## 2024-02-09 LAB — CBC
HCT: 34.6 % — ABNORMAL LOW (ref 36.0–46.0)
Hemoglobin: 10.5 g/dL — ABNORMAL LOW (ref 12.0–15.0)
MCH: 25.6 pg — ABNORMAL LOW (ref 26.0–34.0)
MCHC: 30.3 g/dL (ref 30.0–36.0)
MCV: 84.4 fL (ref 80.0–100.0)
Platelets: 146 K/uL — ABNORMAL LOW (ref 150–400)
RBC: 4.1 MIL/uL (ref 3.87–5.11)
RDW: 20 % — ABNORMAL HIGH (ref 11.5–15.5)
WBC: 5.4 K/uL (ref 4.0–10.5)
nRBC: 0 % (ref 0.0–0.2)

## 2024-02-09 LAB — HEMOGLOBIN A1C
Hgb A1c MFr Bld: 4.9 % (ref 4.8–5.6)
Mean Plasma Glucose: 94 mg/dL

## 2024-02-09 LAB — TSH: TSH: 0.399 u[IU]/mL (ref 0.350–4.500)

## 2024-02-09 LAB — BASIC METABOLIC PANEL WITH GFR
Anion gap: 11 (ref 5–15)
BUN: 9 mg/dL (ref 6–20)
CO2: 25 mmol/L (ref 22–32)
Calcium: 9.1 mg/dL (ref 8.9–10.3)
Chloride: 105 mmol/L (ref 98–111)
Creatinine, Ser: 0.54 mg/dL (ref 0.44–1.00)
GFR, Estimated: >60 mL/min (ref >60)
Glucose, Bld: 100 mg/dL — ABNORMAL HIGH (ref 70–99)
Potassium: 3.9 mmol/L (ref 3.5–5.1)
Sodium: 141 mmol/L (ref 135–145)

## 2024-02-09 LAB — T4, FREE: Free T4: 0.78 ng/dL (ref 0.61–1.12)

## 2024-02-09 NOTE — Progress Notes (Addendum)
 Name: Ashley [name]  MRN/ DOB: [id number], 07/27/1985    Age/ Sex: 38 y.o., female    PCP: [name], MD   Reason for Endocrinology Evaluation: Thyromegaly     Date of Initial Endocrinology Evaluation: 02/09/2024     HPI: Ashley [name] is a 38 y.o. female with a past medical history of obesity, OSA on BiPAP, chronic respiratory failure with hypoxia, adenocarcinoma of the colon (Dx 09/2023). 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   Maternal GM with colon cancer  No FH of thyroid  disease   Repeat TFTs at her initial visit to our clinic showed normalization.  TRAb was normal at 1.32 TSI was normal below 89  SUBJECTIVE:    Today (02/09/24):  Ashley [name] is here for follow-up on hyperthyroidism.    Patient was evaluated by oncology through [facility] She is tolerating chemo, last chemo was 01/15/2024, scheduled for surgery 02/16/2024 (partial colectomy)   Patient has been noted weight gain since her last visit She is nervous about the sx  No local neck swelling  Last night had palpitations that attributes to being nervous  Appetite is good  No dysphagia  No tremors  Had transient dizziness yesterday  No constipation or diarrhea      HISTORY:  Past Medical History:  Past Medical History:  Diagnosis Date   colon ca 10/2023   GERD (gastroesophageal reflux disease) 2012   diet controlled - no meds   Headache(784.0)    3X WEEK    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   Irregular periods/menstrual cycles 01/30/2012   Late prenatal care 01/30/2012   Poor social situation 01/30/2012   Previous cesarean section complicating pregnancy, antepartum condition or complication 01/27/2016   2 previous cesarean section- Desires repeat with BTL   Single liveborn, born in hospital, delivered by cesarean delivery 08/13/2016   Status post repeat low transverse cesarean section 08/14/2016   Status post tubal ligation at time of delivery, current hosp 08/14/2016   Past Surgical History:  Past Surgical History:  Procedure Laterality Date   CESAREAN SECTION  2009   CESAREAN SECTION  01/30/2012   Procedure: CESAREAN SECTION;  Surgeon: Ovid DELENA All, MD;  Location: [facility] ORS;  Service: Gynecology;  Laterality: N/A;  Repeat C/S   CESAREAN SECTION WITH BILATERAL TUBAL LIGATION Bilateral 08/13/2016   Procedure: CESAREAN SECTION WITH BILATERAL TUBAL LIGATION;  Surgeon: Burnard VEAR Pate, MD;  Location: [facility] BIRTHING SUITES;  Service: Obstetrics;  Laterality: Bilateral;   CHOLECYSTECTOMY N/A 10/16/2013   Procedure: LAPAROSCOPIC CHOLECYSTECTOMY WITH INTRAOPERATIVE CHOLANGIOGRAM;  Surgeon: Donnice POUR. Belinda, MD;  Location: [facility] OR;  Service: General;  Laterality: N/A;   COLONOSCOPY WITH PROPOFOL  N/A 09/11/2023   Procedure: COLONOSCOPY WITH PROPOFOL ;  Surgeon: [name], MD;  Location: [facility] ENDOSCOPY;  Service: Gastroenterology;  Laterality: N/A;   DILATION AND EVACUATION N/A 07/28/2015   Procedure: SUCTION, DILATATION AND EVACUATION;  Surgeon: Carlin DELENA Centers, MD;  Location: [facility] ORS;  Service: Gynecology;  Laterality: N/A;   ESOPHAGOGASTRODUODENOSCOPY (EGD) WITH PROPOFOL  N/A 09/11/2023   Procedure: ESOPHAGOGASTRODUODENOSCOPY (EGD) WITH PROPOFOL ;  Surgeon: [name], MD;  Location: [facility] ENDOS[facility];  Service: Gastroenterology;  Laterality: N/A;   IR IMAGING GUIDED PORT INSERTION  10/20/2023   IR US  LIVER BIOPSY  10/20/2023   SUBMUCOSAL INJECTION  09/11/2023    Procedure: INJECTION, SUBMUCOSAL;  Surgeon: [name], MD;  Location: [facility] ENDOSCOPY;  Service: Gastroenterology;;   TONSILLECTOMY  AGE 8   TONSILLECTOMY AND ADENOIDECTOMY  AGEB 18    Social History:  reports that she quit smoking about 14 years ago. Her smoking use included cigarettes. She has never used smokeless tobacco. She reports that she does not drink alcohol and does not use drugs. Family History: family history includes Asthma in her mother; Colon cancer (age of onset: 19) in her maternal grandmother; Early death in her mother; Hypertension in her mother.   HOME MEDICATIONS: Allergies as of 02/09/2024       Reactions   Fruit Extracts Itching   Fresh Fruit         Medication List        Accurate as of February 09, 2024  8:33 AM. If you have any questions, ask your nurse or doctor.          doxycycline  100 MG tablet Commonly known as: VIBRA -TABS Take 1 tablet (100 mg total) by mouth 2 (two) times daily.   lidocaine -prilocaine  cream Commonly known as: EMLA  Apply to affected area once What changed:  how much to take how to take this when to take this reasons to take this additional instructions   loratadine  10 MG tablet Commonly known as: Claritin  Take 1 tablet (10 mg total) by mouth daily.   magnesium  oxide 400 (240 Mg) MG tablet Commonly known as: MAG-OX Take 1 tablet (400 mg total) by mouth 2 (two) times daily.   MEDI-CORTISONE EX Apply 1 application  topically daily. Applied to face once daily   multivitamin-prenatal 27-0.8 MG Tabs tablet Take 1 tablet by mouth in the morning.   NON FORMULARY Take 1 Dose by mouth daily. [place]   omeprazole  20 MG tablet Commonly known as: PriLOSEC  OTC Take 1 tablet (20 mg total) by mouth daily.   OVER THE COUNTER MEDICATION Take 2 each by mouth in the morning. Soursop Garviola Gummies   potassium chloride  SA 20 MEQ tablet Commonly known as: KLOR-CON  M Take 1 tablet (20 mEq total) by mouth daily.           REVIEW OF SYSTEMS: A comprehensive ROS was conducted with the patient and is negative except as per HPI     OBJECTIVE:  VS: BP 124/80 (BP Location: Left Arm, Patient Position: Sitting, Cuff Size: Normal)   Pulse 82   Ht 5' 4 (1.626 m)   Wt 261 lb (118.4 kg)   SpO2 98%   BMI 44.80 kg/m    Wt Readings from Last 3 Encounters:  02/09/24 261 lb (118.4 kg)  01/15/24 248 lb 4.8 oz (112.6 kg)  01/01/24 243 lb 4.8 oz (110.4 kg)     EXAM: General: Pt appears well and is in NAD  Neck:  Thyroid : Thyroid  gland ~ 90 g  Lungs: Clear with good BS bilat   Heart: Auscultation: RRR.  Abdomen: Soft, nontender  Extremities:  BL LE: No pretibial edema   Mental Status: Judgment, insight: Intact Orientation: Oriented to time, place, and person Mood and affect: No depression, anxiety, or agitation     DATA  REVIEWED:  Latest Reference Range & Units 02/09/24 10:48  TSH 0.350 - 4.500 uIU/mL 0.399     Latest Reference Range & Units 01/15/24 07:43  Sodium 135 - 145 mmol/L 142  Potassium 3.5 - 5.1 mmol/L 3.5  Chloride 98 - 111 mmol/L 106  CO2 22 - 32 mmol/L 31  Glucose 70 - 99 mg/dL 892 (H)  BUN 6 - 20 mg/dL <5 (L)  Creatinine 9.55 - 1.00 mg/dL 9.44  Calcium  8.9 - 10.3 mg/dL 8.7 (L)  Anion gap 5 - 15  5  Magnesium  1.7 - 2.4 mg/dL 1.6 (L)  Alkaline Phosphatase 38 - 126 U/L 107  Albumin 3.5 - 5.0 g/dL 3.2 (L)  AST 15 - 41 U/L 17  ALT 0 - 44 U/L 17  Total Protein 6.5 - 8.1 g/dL 6.5  Total Bilirubin 0.0 - 1.2 mg/dL 0.3  GFR, Est Non African American >60 mL/min >60     Thyroid  Ultrasound 07/28/2023   Estimated total number of nodules >/= 1 cm: 0   Number of spongiform nodules >/=  2 cm not described below (TR1): 0   Number of mixed cystic and solid nodules >/= 1.5 cm not described below (TR2): 0   _________________________________________________________   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 No normal thyroid  tissue is visible. The masslike thyroid  gland is  composed entirely of confluent regions of general pseudo nodularity all appearing sonographically similar. No definite discrete thyroid  nodule separate from the background thyroid  parenchyma.   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    Old records , labs and images have been reviewed.    ASSESSMENT/PLAN/RECOMMENDATIONS:   Hx of Hyperthyroidism  -Patient is clinically euthyroid except for weight gain that she attributes to increased appetite after chemotherapy -Differential diagnosis includes Graves' disease versus autonomous thyroid  nodule - TSI and TRAb were within normal range - TSH is normal, no intervention needed   2. Thyromegaly :  -No local neck symptoms -Thyroid  ultrasound showed massive goiter with enlargement of the thyroid  gland with pseudo nodularity but no true thyroid  nodules January, 2025 -I did discuss with the patient that a total thyroidectomy would be a reasonable option given the size and to prevent compressive symptoms down the road   Follow-up in 6 months   I spent 25 minutes preparing to see the patient by review of recent labs, imaging and procedures, obtaining and reviewing separately obtained history, communicating with the patient, ordering medications, tests or procedures, and documenting clinical information in the EHR including the differential Dx, treatment, and any further evaluation and other management   Signed electronically by: Stefano Redgie Butts, MD  [facility] Endocrinology  [facility] [address] [place], [place] [address] Phone: [phone] FAX: [phone]   CC: [name], MD [address] [place] [address] Phone: [phone] Fax: [phone]   Return to Endocrinology clinic as below: Future Appointments  Date Time Provider Department Center  02/09/2024 10:00 AM [facility] PAT  5 [facility] None

## 2024-02-09 NOTE — Consult Note (Signed)
 WOC Nurse requested for preoperative stoma site marking  Discussed surgical procedure and stoma creation with patient and family.  Explained role of the WOC nurse team.  Provided the patient with educational booklet and provided samples of pouching options.  Answered patient and family questions.   Examined patient lying, sitting, and standing in order to place the marking in the patient's visual field, away from any creases or abdominal contour issues and within the rectus muscle.  Attempted to mark below the patient's belt line.   Marked for colostomy in the LLQ  8 cm to the left of the umbilicus and 1 cm below the umbilicus. Note: Abd pannus present, I raised the ostomy site so that the patient could see and care for the ostomy independently.  Marked for ileostomy in the RLQ 9 cm to the right of the umbilicus and 1 cm below the umbilicus.  Patient's abdomen cleansed with CHG wipes at site markings, allowed to air dry prior to marking. Covered mark with thin film transparent dressing to preserve mark until date of surgery.   WOC Nurse team will follow up with patient after surgery for continue ostomy care and teaching.  Surgery schedule for 08/15.  Thank-you,  Lela Holm BSN, CNS, RN, [employer], WOCN  (Phone [phone])

## 2024-02-10 ENCOUNTER — Other Ambulatory Visit: Payer: Self-pay

## 2024-02-10 LAB — T3, FREE: T3, Free: 3.8 pg/mL (ref 2.0–4.4)

## 2024-02-12 ENCOUNTER — Telehealth: Payer: Self-pay | Admitting: Hematology

## 2024-02-12 NOTE — Telephone Encounter (Signed)
 Scheduled appointments per staff message. Talked with the patient and she is aware of the made appointments.

## 2024-02-16 ENCOUNTER — Inpatient Hospital Stay (HOSPITAL_COMMUNITY)
Admission: RE | Admit: 2024-02-16 | Discharge: 2024-02-19 | DRG: 330 | Disposition: A | Discharge status: Home / Self Care | Source: Ambulatory Visit | Attending: Surgery | Admitting: Surgery

## 2024-02-16 ENCOUNTER — Other Ambulatory Visit: Payer: Self-pay

## 2024-02-16 ENCOUNTER — Encounter (HOSPITAL_COMMUNITY)
Admission: RE | Disposition: A | Discharge status: Home / Self Care | Payer: Self-pay | Source: Ambulatory Visit | Attending: Surgery

## 2024-02-16 ENCOUNTER — Inpatient Hospital Stay (HOSPITAL_COMMUNITY): Admitting: Registered Nurse

## 2024-02-16 ENCOUNTER — Encounter (HOSPITAL_COMMUNITY): Payer: Self-pay | Admitting: Surgery

## 2024-02-16 DIAGNOSIS — Z6841 Body Mass Index (BMI) 40.0 and over, adult: Secondary | ICD-10-CM | POA: Diagnosis not present

## 2024-02-16 DIAGNOSIS — Z95828 Presence of other vascular implants and grafts: Secondary | ICD-10-CM

## 2024-02-16 DIAGNOSIS — K56699 Other intestinal obstruction unspecified as to partial versus complete obstruction: Secondary | ICD-10-CM | POA: Diagnosis not present

## 2024-02-16 DIAGNOSIS — Z87891 Personal history of nicotine dependence: Secondary | ICD-10-CM | POA: Diagnosis not present

## 2024-02-16 DIAGNOSIS — R160 Hepatomegaly, not elsewhere classified: Secondary | ICD-10-CM | POA: Diagnosis present

## 2024-02-16 DIAGNOSIS — C186 Malignant neoplasm of descending colon: Secondary | ICD-10-CM

## 2024-02-16 DIAGNOSIS — E66813 Obesity, class 3: Secondary | ICD-10-CM | POA: Diagnosis not present

## 2024-02-16 DIAGNOSIS — Z8249 Family history of ischemic heart disease and other diseases of the circulatory system: Secondary | ICD-10-CM | POA: Diagnosis not present

## 2024-02-16 DIAGNOSIS — K760 Fatty (change of) liver, not elsewhere classified: Secondary | ICD-10-CM | POA: Diagnosis present

## 2024-02-16 DIAGNOSIS — C185 Malignant neoplasm of splenic flexure: Secondary | ICD-10-CM | POA: Diagnosis present

## 2024-02-16 DIAGNOSIS — C787 Secondary malignant neoplasm of liver and intrahepatic bile duct: Secondary | ICD-10-CM | POA: Diagnosis not present

## 2024-02-16 DIAGNOSIS — K219 Gastro-esophageal reflux disease without esophagitis: Secondary | ICD-10-CM | POA: Diagnosis present

## 2024-02-16 DIAGNOSIS — E6689 Other obesity not elsewhere classified: Secondary | ICD-10-CM | POA: Diagnosis not present

## 2024-02-16 DIAGNOSIS — C772 Secondary and unspecified malignant neoplasm of intra-abdominal lymph nodes: Principal | ICD-10-CM | POA: Diagnosis present

## 2024-02-16 DIAGNOSIS — Z800 Family history of malignant neoplasm of digestive organs: Secondary | ICD-10-CM | POA: Diagnosis not present

## 2024-02-16 LAB — TYPE AND SCREEN
ABO/RH(D): B POS
Antibody Screen: NEGATIVE

## 2024-02-16 LAB — POCT PREGNANCY, URINE: Preg Test, Ur: NEGATIVE

## 2024-02-16 SURGERY — COLECTOMY, PARTIAL, ROBOT-ASSISTED, LAPAROSCOPIC
Anesthesia: General

## 2024-02-16 MED ORDER — NAPHAZOLINE-GLYCERIN 0.012-0.25 % OP SOLN: 1.0000 [drp] | Freq: Four times a day (QID) | OPHTHALMIC | Status: DC | PRN

## 2024-02-16 MED ORDER — FENTANYL CITRATE (PF) 100 MCG/2ML IJ SOLN
INTRAMUSCULAR | Status: DC | PRN
  Administered 2024-02-16: 100 ug via INTRAVENOUS
  Administered 2024-02-16 (×3): 50 ug via INTRAVENOUS

## 2024-02-16 MED ORDER — MENTHOL 3 MG MT LOZG: 1.0000 | LOZENGE | OROMUCOSAL | Status: DC | PRN

## 2024-02-16 MED ORDER — CHLORHEXIDINE GLUCONATE 0.12 % MT SOLN
15.0000 mL | Freq: Once | OROMUCOSAL | Status: AC
  Administered 2024-02-16: 15 mL via OROMUCOSAL

## 2024-02-16 MED ORDER — LIDOCAINE-PRILOCAINE 2.5-2.5 % EX CREA: 1.0000 | TOPICAL_CREAM | Freq: Every day | CUTANEOUS | Status: DC | PRN

## 2024-02-16 MED ORDER — SUGAMMADEX SODIUM 200 MG/2ML IV SOLN
INTRAVENOUS | Status: DC | PRN
  Administered 2024-02-16: 300 mg via INTRAVENOUS

## 2024-02-16 MED ORDER — METOPROLOL TARTRATE 5 MG/5ML IV SOLN: 5.0000 mg | Freq: Four times a day (QID) | INTRAVENOUS | Status: DC | PRN

## 2024-02-16 MED ORDER — OXYCODONE HCL 5 MG PO TABS: 5.0000 mg | ORAL_TABLET | Freq: Once | ORAL | Status: DC | PRN

## 2024-02-16 MED ORDER — LIDOCAINE HCL (CARDIAC) PF 100 MG/5ML IV SOSY
PREFILLED_SYRINGE | INTRAVENOUS | Status: DC | PRN
  Administered 2024-02-16: 100 mg via INTRAVENOUS

## 2024-02-16 MED ORDER — ONDANSETRON HCL 4 MG PO TABS: 4.0000 mg | ORAL_TABLET | Freq: Four times a day (QID) | ORAL | Status: DC | PRN

## 2024-02-16 MED ORDER — MAGIC MOUTHWASH: 15.0000 mL | Freq: Four times a day (QID) | ORAL | Status: DC | PRN

## 2024-02-16 MED ORDER — BUPIVACAINE-EPINEPHRINE (PF) 0.25% -1:200000 IJ SOLN
INTRAMUSCULAR | Status: AC
Start: 2024-02-16 — End: 2024-02-16
  Filled 2024-02-16: qty 60

## 2024-02-16 MED ORDER — MELATONIN 3 MG PO TABS
3.0000 mg | ORAL_TABLET | Freq: Every evening | ORAL | Status: DC | PRN
  Administered 2024-02-17 – 2024-02-18 (×2): 3 mg via ORAL
  Filled 2024-02-16 (×2): qty 1

## 2024-02-16 MED ORDER — MIDAZOLAM HCL 2 MG/2ML IJ SOLN
INTRAMUSCULAR | Status: AC
  Filled 2024-02-16: qty 2

## 2024-02-16 MED ORDER — METHOCARBAMOL 1000 MG/10ML IJ SOLN: 1000.0000 mg | Freq: Four times a day (QID) | INTRAMUSCULAR | Status: DC | PRN

## 2024-02-16 MED ORDER — PROPOFOL 10 MG/ML IV BOLUS
INTRAVENOUS | Status: DC | PRN
  Administered 2024-02-16: 160 mg via INTRAVENOUS

## 2024-02-16 MED ORDER — ALVIMOPAN 12 MG PO CAPS
12.0000 mg | ORAL_CAPSULE | ORAL | Status: AC
  Administered 2024-02-16: 12 mg via ORAL
  Filled 2024-02-16: qty 1

## 2024-02-16 MED ORDER — METHOCARBAMOL 500 MG PO TABS
1000.0000 mg | ORAL_TABLET | Freq: Four times a day (QID) | ORAL | Status: DC | PRN
  Administered 2024-02-18 – 2024-02-19 (×4): 1000 mg via ORAL
  Filled 2024-02-16 (×5): qty 2

## 2024-02-16 MED ORDER — LACTATED RINGERS IV SOLN: INTRAVENOUS | Status: DC

## 2024-02-16 MED ORDER — SODIUM CHLORIDE 0.9 % IV SOLN: Freq: Three times a day (TID) | INTRAVENOUS | Status: AC | PRN

## 2024-02-16 MED ORDER — PROCHLORPERAZINE EDISYLATE 10 MG/2ML IJ SOLN: 5.0000 mg | Freq: Four times a day (QID) | INTRAMUSCULAR | Status: DC | PRN

## 2024-02-16 MED ORDER — SODIUM CHLORIDE 0.9 % IV SOLN: 250.0000 mL | INTRAVENOUS | Status: DC | PRN

## 2024-02-16 MED ORDER — SODIUM CHLORIDE 0.9 % IV SOLN
2.0000 g | INTRAVENOUS | Status: AC
  Administered 2024-02-16: 2 g via INTRAVENOUS
  Filled 2024-02-16: qty 2

## 2024-02-16 MED ORDER — ROCURONIUM BROMIDE 10 MG/ML (PF) SYRINGE
PREFILLED_SYRINGE | INTRAVENOUS | Status: AC
  Filled 2024-02-16: qty 10

## 2024-02-16 MED ORDER — CELECOXIB 200 MG PO CAPS
200.0000 mg | ORAL_CAPSULE | ORAL | Status: AC
  Administered 2024-02-16: 200 mg via ORAL
  Filled 2024-02-16: qty 1

## 2024-02-16 MED ORDER — PRENATAL MULTIVITAMIN CH
1.0000 | ORAL_TABLET | Freq: Every morning | ORAL | Status: DC
  Administered 2024-02-17 – 2024-02-19 (×3): 1 via ORAL
  Filled 2024-02-16 (×3): qty 1

## 2024-02-16 MED ORDER — LORATADINE 10 MG PO TABS
10.0000 mg | ORAL_TABLET | Freq: Every day | ORAL | Status: DC
  Administered 2024-02-17 – 2024-02-19 (×3): 10 mg via ORAL
  Filled 2024-02-16 (×3): qty 1

## 2024-02-16 MED ORDER — BUPIVACAINE LIPOSOME 1.3 % IJ SUSP: 20.0000 mL | Freq: Once | INTRAMUSCULAR | Status: DC

## 2024-02-16 MED ORDER — ACETAMINOPHEN 500 MG PO TABS
1000.0000 mg | ORAL_TABLET | Freq: Four times a day (QID) | ORAL | Status: DC
  Administered 2024-02-16 – 2024-02-19 (×10): 1000 mg via ORAL
  Filled 2024-02-16 (×10): qty 2

## 2024-02-16 MED ORDER — HYDROMORPHONE HCL 2 MG/ML IJ SOLN
INTRAMUSCULAR | Status: AC
  Filled 2024-02-16: qty 1

## 2024-02-16 MED ORDER — CALCIUM POLYCARBOPHIL 625 MG PO TABS
625.0000 mg | ORAL_TABLET | Freq: Two times a day (BID) | ORAL | Status: DC
  Administered 2024-02-16 – 2024-02-19 (×6): 625 mg via ORAL
  Filled 2024-02-16 (×6): qty 1

## 2024-02-16 MED ORDER — PROPOFOL 10 MG/ML IV BOLUS
INTRAVENOUS | Status: AC
  Filled 2024-02-16: qty 20

## 2024-02-16 MED ORDER — SIMETHICONE 80 MG PO CHEW: 40.0000 mg | CHEWABLE_TABLET | Freq: Four times a day (QID) | ORAL | Status: DC | PRN

## 2024-02-16 MED ORDER — DIPHENHYDRAMINE HCL 50 MG/ML IJ SOLN
12.5000 mg | Freq: Four times a day (QID) | INTRAMUSCULAR | Status: DC | PRN
Start: 2024-02-16 — End: 2024-02-19

## 2024-02-16 MED ORDER — GABAPENTIN 100 MG PO CAPS
200.0000 mg | ORAL_CAPSULE | ORAL | Status: AC
  Administered 2024-02-16: 200 mg via ORAL
  Filled 2024-02-16: qty 2

## 2024-02-16 MED ORDER — GABAPENTIN 100 MG PO CAPS
200.0000 mg | ORAL_CAPSULE | Freq: Every day | ORAL | Status: DC
  Administered 2024-02-16 – 2024-02-18 (×3): 200 mg via ORAL
  Filled 2024-02-16 (×3): qty 2

## 2024-02-16 MED ORDER — 0.9 % SODIUM CHLORIDE (POUR BTL) OPTIME
TOPICAL | Status: DC | PRN
  Administered 2024-02-16: 1000 mL

## 2024-02-16 MED ORDER — PHENYLEPHRINE 80 MCG/ML (10ML) SYRINGE FOR IV PUSH (FOR BLOOD PRESSURE SUPPORT)
PREFILLED_SYRINGE | INTRAVENOUS | Status: DC | PRN
  Administered 2024-02-16: 80 ug via INTRAVENOUS

## 2024-02-16 MED ORDER — ACETAMINOPHEN 10 MG/ML IV SOLN: 1000.0000 mg | Freq: Once | INTRAVENOUS | Status: DC | PRN

## 2024-02-16 MED ORDER — ONDANSETRON HCL 4 MG/2ML IJ SOLN
INTRAMUSCULAR | Status: AC
  Filled 2024-02-16: qty 2

## 2024-02-16 MED ORDER — DEXAMETHASONE SODIUM PHOSPHATE 10 MG/ML IJ SOLN
INTRAMUSCULAR | Status: AC
  Filled 2024-02-16: qty 1

## 2024-02-16 MED ORDER — SALINE SPRAY 0.65 % NA SOLN: 1.0000 | Freq: Four times a day (QID) | NASAL | Status: DC | PRN

## 2024-02-16 MED ORDER — OMEPRAZOLE 20 MG PO CPDR
20.0000 mg | DELAYED_RELEASE_CAPSULE | Freq: Every day | ORAL | Status: DC
  Administered 2024-02-17 – 2024-02-19 (×3): 20 mg via ORAL
  Filled 2024-02-16 (×3): qty 1

## 2024-02-16 MED ORDER — HYDROMORPHONE HCL 1 MG/ML IJ SOLN
INTRAMUSCULAR | Status: DC | PRN
  Administered 2024-02-16: .5 mg via INTRAVENOUS

## 2024-02-16 MED ORDER — LACTATED RINGERS IV BOLUS: 1000.0000 mL | Freq: Three times a day (TID) | INTRAVENOUS | Status: AC | PRN

## 2024-02-16 MED ORDER — ENSURE PRE-SURGERY PO LIQD: 592.0000 mL | Freq: Once | ORAL | Status: DC

## 2024-02-16 MED ORDER — SUGAMMADEX SODIUM 200 MG/2ML IV SOLN
INTRAVENOUS | Status: AC
  Filled 2024-02-16: qty 2

## 2024-02-16 MED ORDER — ONDANSETRON HCL 4 MG/2ML IJ SOLN: 4.0000 mg | Freq: Once | INTRAMUSCULAR | Status: DC | PRN

## 2024-02-16 MED ORDER — DIPHENHYDRAMINE HCL 12.5 MG/5ML PO ELIX: 12.5000 mg | ORAL_SOLUTION | Freq: Four times a day (QID) | ORAL | Status: DC | PRN

## 2024-02-16 MED ORDER — MIDAZOLAM HCL 5 MG/5ML IJ SOLN
INTRAMUSCULAR | Status: DC | PRN
  Administered 2024-02-16: 2 mg via INTRAVENOUS

## 2024-02-16 MED ORDER — ENOXAPARIN SODIUM 40 MG/0.4ML IJ SOSY
40.0000 mg | PREFILLED_SYRINGE | INTRAMUSCULAR | Status: DC
  Administered 2024-02-17 – 2024-02-19 (×3): 40 mg via SUBCUTANEOUS
  Filled 2024-02-16 (×3): qty 0.4

## 2024-02-16 MED ORDER — ORAL CARE MOUTH RINSE: 15.0000 mL | Freq: Once | OROMUCOSAL | Status: AC

## 2024-02-16 MED ORDER — SODIUM CHLORIDE 0.9% FLUSH
3.0000 mL | Freq: Two times a day (BID) | INTRAVENOUS | Status: DC
  Administered 2024-02-16 – 2024-02-19 (×4): 3 mL via INTRAVENOUS

## 2024-02-16 MED ORDER — ALVIMOPAN 12 MG PO CAPS
12.0000 mg | ORAL_CAPSULE | Freq: Two times a day (BID) | ORAL | Status: DC
  Administered 2024-02-17 – 2024-02-19 (×4): 12 mg via ORAL
  Filled 2024-02-16 (×4): qty 1

## 2024-02-16 MED ORDER — FENTANYL CITRATE PF 50 MCG/ML IJ SOSY: 25.0000 ug | PREFILLED_SYRINGE | INTRAMUSCULAR | Status: DC | PRN

## 2024-02-16 MED ORDER — FENTANYL CITRATE (PF) 250 MCG/5ML IJ SOLN
INTRAMUSCULAR | Status: AC
  Filled 2024-02-16: qty 5

## 2024-02-16 MED ORDER — KCL IN DEXTROSE-NACL 20-5-0.45 MEQ/L-%-% IV SOLN
INTRAVENOUS | Status: AC
  Filled 2024-02-16: qty 1000

## 2024-02-16 MED ORDER — HYDRALAZINE HCL 20 MG/ML IJ SOLN: 10.0000 mg | INTRAMUSCULAR | Status: DC | PRN

## 2024-02-16 MED ORDER — SODIUM CHLORIDE 0.9 % IV SOLN
2.0000 g | Freq: Two times a day (BID) | INTRAVENOUS | Status: AC
  Administered 2024-02-16: 2 g via INTRAVENOUS
  Filled 2024-02-16: qty 2

## 2024-02-16 MED ORDER — ONDANSETRON HCL 4 MG/2ML IJ SOLN
4.0000 mg | Freq: Four times a day (QID) | INTRAMUSCULAR | Status: DC | PRN
  Administered 2024-02-17: 4 mg via INTRAVENOUS
  Filled 2024-02-16 (×2): qty 2

## 2024-02-16 MED ORDER — ALBUMIN HUMAN 5 % IV SOLN: INTRAVENOUS | Status: DC | PRN

## 2024-02-16 MED ORDER — LIDOCAINE HCL (PF) 2 % IJ SOLN
INTRAMUSCULAR | Status: AC
  Filled 2024-02-16: qty 5

## 2024-02-16 MED ORDER — HYDROMORPHONE HCL 1 MG/ML IJ SOLN
0.5000 mg | INTRAMUSCULAR | Status: DC | PRN
  Administered 2024-02-16 – 2024-02-17 (×4): 1 mg via INTRAVENOUS
  Filled 2024-02-16: qty 1
  Filled 2024-02-16: qty 2
  Filled 2024-02-16 (×2): qty 1

## 2024-02-16 MED ORDER — BUPIVACAINE-EPINEPHRINE (PF) 0.25% -1:200000 IJ SOLN
INTRAMUSCULAR | Status: DC | PRN
  Administered 2024-02-16: 50 mL

## 2024-02-16 MED ORDER — ACETAMINOPHEN 500 MG PO TABS
1000.0000 mg | ORAL_TABLET | ORAL | Status: AC
  Administered 2024-02-16: 1000 mg via ORAL
  Filled 2024-02-16: qty 2

## 2024-02-16 MED ORDER — ALBUMIN HUMAN 5 % IV SOLN
INTRAVENOUS | Status: AC
  Filled 2024-02-16: qty 500

## 2024-02-16 MED ORDER — PHENOL 1.4 % MT LIQD: 2.0000 | OROMUCOSAL | Status: DC | PRN

## 2024-02-16 MED ORDER — ENSURE PRE-SURGERY PO LIQD: 296.0000 mL | Freq: Once | ORAL | Status: DC

## 2024-02-16 MED ORDER — PROCHLORPERAZINE MALEATE 10 MG PO TABS
10.0000 mg | ORAL_TABLET | Freq: Four times a day (QID) | ORAL | Status: DC | PRN
Start: 2024-02-16 — End: 2024-02-19

## 2024-02-16 MED ORDER — ENSURE SURGERY PO LIQD
237.0000 mL | Freq: Two times a day (BID) | ORAL | Status: DC
  Administered 2024-02-17 – 2024-02-18 (×4): 237 mL via ORAL

## 2024-02-16 MED ORDER — LACTATED RINGERS IV SOLN: INTRAVENOUS | Status: DC | PRN

## 2024-02-16 MED ORDER — ALUM & MAG HYDROXIDE-SIMETH 200-200-20 MG/5ML PO SUSP: 30.0000 mL | Freq: Four times a day (QID) | ORAL | Status: DC | PRN

## 2024-02-16 MED ORDER — TRAMADOL HCL 50 MG PO TABS
50.0000 mg | ORAL_TABLET | Freq: Four times a day (QID) | ORAL | Status: DC | PRN
  Administered 2024-02-16 – 2024-02-17 (×2): 100 mg via ORAL
  Filled 2024-02-16 (×2): qty 2

## 2024-02-16 MED ORDER — ONDANSETRON HCL 4 MG/2ML IJ SOLN
INTRAMUSCULAR | Status: DC | PRN
  Administered 2024-02-16: 4 mg via INTRAVENOUS

## 2024-02-16 MED ORDER — OXYCODONE HCL 5 MG/5ML PO SOLN: 5.0000 mg | Freq: Once | ORAL | Status: DC | PRN

## 2024-02-16 MED ORDER — SODIUM CHLORIDE 0.9% FLUSH
3.0000 mL | INTRAVENOUS | Status: DC | PRN
  Administered 2024-02-18: 3 mL via INTRAVENOUS

## 2024-02-16 MED ORDER — PHENYLEPHRINE HCL-NACL 20-0.9 MG/250ML-% IV SOLN
INTRAVENOUS | Status: DC | PRN
  Administered 2024-02-16: 20 ug/min via INTRAVENOUS

## 2024-02-16 MED ORDER — ENOXAPARIN SODIUM 40 MG/0.4ML IJ SOSY
40.0000 mg | PREFILLED_SYRINGE | Freq: Once | INTRAMUSCULAR | Status: AC
  Administered 2024-02-16: 40 mg via SUBCUTANEOUS
  Filled 2024-02-16: qty 0.4

## 2024-02-16 MED ORDER — DEXAMETHASONE SODIUM PHOSPHATE 10 MG/ML IJ SOLN
INTRAMUSCULAR | Status: DC | PRN
  Administered 2024-02-16: 10 mg via INTRAVENOUS

## 2024-02-16 MED ORDER — ROCURONIUM BROMIDE 100 MG/10ML IV SOLN
INTRAVENOUS | Status: DC | PRN
  Administered 2024-02-16: 100 mg via INTRAVENOUS
  Administered 2024-02-16: 20 mg via INTRAVENOUS

## 2024-02-16 SURGICAL SUPPLY — 85 items
BAG COUNTER SPONGE SURGICOUNT (BAG) ×1 IMPLANT
BLADE EXTENDED COATED 6.5IN (ELECTRODE) IMPLANT
CANNULA REDUCER 12-8 DVNC XI (CANNULA) IMPLANT
CELLS DAT CNTRL 66122 CELL SVR (MISCELLANEOUS) IMPLANT
CHLORAPREP W/TINT 26 (MISCELLANEOUS) IMPLANT
CLIP APPLIE 5 13 M/L LIGAMAX5 (MISCELLANEOUS) IMPLANT
CLIP APPLIE ROT 10 11.4 M/L (STAPLE) IMPLANT
COVER SURGICAL LIGHT HANDLE (MISCELLANEOUS) ×2 IMPLANT
COVER TIP SHEARS 8 DVNC (MISCELLANEOUS) ×1 IMPLANT
DEFOGGER SCOPE WARM SEASHARP (MISCELLANEOUS) ×1 IMPLANT
DEVICE TROCAR PUNCTURE CLOSURE (ENDOMECHANICALS) IMPLANT
DRAIN CHANNEL 19F RND (DRAIN) IMPLANT
DRAPE ARM DVNC X/XI (DISPOSABLE) ×4 IMPLANT
DRAPE COLUMN DVNC XI (DISPOSABLE) ×1 IMPLANT
DRAPE CV SPLIT W-CLR ANES SCRN (DRAPES) ×1 IMPLANT
DRAPE PERI GROIN 82X75IN TIB (DRAPES) ×1 IMPLANT
DRAPE SURG IRRIG POUCH 19X23 (DRAPES) ×1 IMPLANT
DRIVER NDL LRG 8 DVNC XI (INSTRUMENTS) ×1 IMPLANT
DRIVER NDLE LRG 8 DVNC XI (INSTRUMENTS) ×1 IMPLANT
DRSG OPSITE POSTOP 4X10 (GAUZE/BANDAGES/DRESSINGS) IMPLANT
DRSG OPSITE POSTOP 4X6 (GAUZE/BANDAGES/DRESSINGS) IMPLANT
DRSG OPSITE POSTOP 4X8 (GAUZE/BANDAGES/DRESSINGS) IMPLANT
DRSG TEGADERM 2-3/8X2-3/4 SM (GAUZE/BANDAGES/DRESSINGS) ×5 IMPLANT
DRSG TEGADERM 4X4.75 (GAUZE/BANDAGES/DRESSINGS) IMPLANT
ELECT PENCIL ROCKER SW 15FT (MISCELLANEOUS) ×1 IMPLANT
ELECT REM PT RETURN 15FT ADLT (MISCELLANEOUS) ×1 IMPLANT
ENDOLOOP SUT PDS II 0 18 (SUTURE) IMPLANT
EVACUATOR SILICONE 100CC (DRAIN) IMPLANT
GAUZE SPONGE 2X2 8PLY STRL LF (GAUZE/BANDAGES/DRESSINGS) ×1 IMPLANT
GLOVE ECLIPSE 8.0 STRL XLNG CF (GLOVE) ×3 IMPLANT
GLOVE INDICATOR 8.0 STRL GRN (GLOVE) ×3 IMPLANT
GOWN SRG XL LVL 4 BRTHBL STRL (GOWNS) ×1 IMPLANT
GOWN STRL REUS W/ TWL XL LVL3 (GOWN DISPOSABLE) ×4 IMPLANT
GRASPER SUT TROCAR 14GX15 (MISCELLANEOUS) IMPLANT
GRASPER TIP-UP FEN DVNC XI (INSTRUMENTS) ×1 IMPLANT
HOLDER FOLEY CATH W/STRAP (MISCELLANEOUS) ×1 IMPLANT
IRRIGATION SUCT STRKRFLW 2 WTP (MISCELLANEOUS) ×1 IMPLANT
KIT PROCEDURE DVNC SI (MISCELLANEOUS) IMPLANT
KIT SIGMOIDOSCOPE (SET/KITS/TRAYS/PACK) IMPLANT
KIT TURNOVER KIT A (KITS) ×1 IMPLANT
NDL INSUFFLATION 14GA 120MM (NEEDLE) ×1 IMPLANT
NDL SPNL 20GX3.5 QUINCKE YW (NEEDLE) ×1 IMPLANT
NEEDLE INSUFFLATION 14GA 120MM (NEEDLE) ×1 IMPLANT
NEEDLE SPNL 20GX3.5 QUINCKE YW (NEEDLE) ×1 IMPLANT
PACK COLON (CUSTOM PROCEDURE TRAY) ×1 IMPLANT
PAD POSITIONING PINK XL (MISCELLANEOUS) ×1 IMPLANT
PROTECTOR NERVE ULNAR (MISCELLANEOUS) ×2 IMPLANT
RELOAD STAPLE 45 3.5 BLU DVNC (STAPLE) IMPLANT
RELOAD STAPLE 45 4.3 GRN DVNC (STAPLE) IMPLANT
RELOAD STAPLE 60 2.5 WHT DVNC (STAPLE) IMPLANT
RELOAD STAPLE 60 3.5 BLU DVNC (STAPLE) IMPLANT
RELOAD STAPLE 60 4.3 GRN DVNC (STAPLE) IMPLANT
RETRACTOR WND ALEXIS 18 MED (MISCELLANEOUS) IMPLANT
SCISSORS LAP 5X35 DISP (ENDOMECHANICALS) ×1 IMPLANT
SCISSORS MNPLR CVD DVNC XI (INSTRUMENTS) ×1 IMPLANT
SEAL UNIV 5-12 XI (MISCELLANEOUS) ×4 IMPLANT
SEALER VESSEL EXT DVNC XI (MISCELLANEOUS) ×1 IMPLANT
SOLUTION ELECTROSURG ANTI STCK (MISCELLANEOUS) ×1 IMPLANT
SPIKE FLUID TRANSFER (MISCELLANEOUS) ×1 IMPLANT
STAPLER 45 SUREFORM DVNC (STAPLE) IMPLANT
STAPLER 60 SUREFORM DVNC (STAPLE) IMPLANT
STAPLER ECHELON POWER CIR 29 (STAPLE) IMPLANT
STAPLER ECHELON POWER CIR 31 (STAPLE) IMPLANT
STOPCOCK 4 WAY LG BORE MALE ST (IV SETS) ×2 IMPLANT
SURGILUBE 2OZ TUBE FLIPTOP (MISCELLANEOUS) IMPLANT
SUT MNCRL AB 4-0 PS2 18 (SUTURE) ×1 IMPLANT
SUT PDS AB 1 CT1 27 (SUTURE) ×2 IMPLANT
SUT PROLENE 0 CT 2 (SUTURE) IMPLANT
SUT PROLENE 2 0 KS (SUTURE) IMPLANT
SUT PROLENE 2 0 SH DA (SUTURE) IMPLANT
SUT SILK 2 0 SH CR/8 (SUTURE) IMPLANT
SUT SILK 3 0 SH CR/8 (SUTURE) ×1 IMPLANT
SUT VIC AB 2-0 SH 18 (SUTURE) ×1 IMPLANT
SUT VIC AB 3-0 SH 18 (SUTURE) IMPLANT
SUT VIC AB 3-0 SH 27XBRD (SUTURE) IMPLANT
SUT VICRYL 0 UR6 27IN ABS (SUTURE) IMPLANT
SUTURE V-LC BRB 180 2/0GR6GS22 (SUTURE) IMPLANT
SYR 20ML ECCENTRIC (SYRINGE) ×1 IMPLANT
SYSTEM LAPSCP GELPORT 120MM (MISCELLANEOUS) IMPLANT
SYSTEM WOUND ALEXIS 18CM MED (MISCELLANEOUS) ×1 IMPLANT
TAPE UMBILICAL 1/8 X36 TWILL (MISCELLANEOUS) ×1 IMPLANT
TRAY FOLEY MTR SLVR 16FR STAT (SET/KITS/TRAYS/PACK) ×1 IMPLANT
TROCAR ADV FIXATION 5X100MM (TROCAR) ×1 IMPLANT
TUBING CONNECTING 10 (TUBING) ×2 IMPLANT
TUBING INSUFFLATION 10FT LAP (TUBING) ×1 IMPLANT

## 2024-02-16 NOTE — Interval H&P Note (Signed)
 History and Physical Interval Note:  02/16/2024 10:56 AM  Ashley [name]  has presented today for surgery, with the diagnosis of COLON CANCER.  The various methods of treatment have been discussed with the patient and family. After consideration of risks, benefits and other options for treatment, the patient has consented to  Procedure(s) with comments: COLECTOMY, PARTIAL, ROBOT-ASSISTED, LAPAROSCOPIC (N/A) - ROBOTIC RESECTION OF COLON, SPLENIC FLEXURE CREATION, CECOSTOMY (N/A) - POSSIBLE OSTOMY SIGMOIDOSCOPY, FLEXIBLE (N/A) as a surgical intervention.  The patient's history has been reviewed, patient examined, no change in status, stable for surgery.  I have reviewed the patient's chart and labs.  Questions were answered to the patient's satisfaction.    I have re-reviewed the the patient's records, history, medications, and allergies.  I have re-examined the patient.  I again discussed intraoperative plans and goals of post-operative recovery.  The patient agrees to proceed.  Ashley [name]  Jul 04, 1986 [id number]  Patient Care Team: [name], MD as PCP - General (Family Medicine) [name], [name], RN as Oncology Nurse Navigator [name], MD as Consulting Physician (Gastroenterology) [name], MD as Consulting Physician (Medical Oncology) [name], MD as Consulting Physician (Obstetrics and Gynecology) [name], MD as Consulting Physician (General Surgery) [name], MD as Consulting Physician (General Surgery)  Patient Active Problem List   Diagnosis Date Noted   Adenocarcinoma of descending colon (HCC) 10/01/2023    Priority: High   Genetic testing 11/10/2023   Port-A-Cath in place 10/24/2023   Subclinical hyperthyroidism 10/09/2023   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 10/05/2023   Liver mass in setting of colon cancer 10/05/2023   Obesity, Class III, BMI 40-49.9 (morbid obesity) 10/05/2023   Abdominal  pain 09/11/2023   Irregular periods/menstrual cycles 01/30/2012   Obesity 11/13/2011    Past Medical History:  Diagnosis Date   colon ca 10/2023   GERD (gastroesophageal reflux disease) 2012   diet controlled - no meds   Headache(784.0)    3X WEEK   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   Irregular periods/menstrual cycles 01/30/2012   Late prenatal care 01/30/2012   Poor social situation 01/30/2012   Previous cesarean section complicating pregnancy, antepartum condition or complication 01/27/2016   2 previous cesarean section- Desires repeat with BTL   Single liveborn, born in hospital, delivered by cesarean delivery 08/13/2016   Status post repeat low transverse cesarean section 08/14/2016   Status post tubal ligation at time of delivery, current hosp 08/14/2016    Past Surgical History:  Procedure Laterality Date   CESAREAN SECTION  2009   CESAREAN SECTION  01/30/2012   Procedure: CESAREAN SECTION;  Surgeon: Ovid LABOR All, MD;  Location: [facility];  Service: Gynecology;  Laterality: N/A;  Repeat C/S   CESAREAN SECTION WITH BILATERAL TUBAL LIGATION Bilateral 08/13/2016   Procedure: CESAREAN SECTION WITH BILATERAL TUBAL LIGATION;  Surgeon: Burnard VEAR Pate, MD;  Location: [facility];  Service: Obstetrics;  Laterality: Bilateral;   CHOLECYSTECTOMY N/A 10/16/2013   Procedure: LAPAROSCOPIC CHOLECYSTECTOMY WITH INTRAOPERATIVE CHOLANGIOGRAM;  Surgeon: Donnice POUR. Belinda, MD;  Location: [facility] OR;  Service: General;  Laterality: N/A;   COLONOSCOPY WITH PROPOFOL  N/A 09/11/2023   Procedure: COLONOSCOPY WITH PROPOFOL ;  Surgeon: [name], MD;  Location: [facility];  Service: Gastroenterology;  Laterality: N/A;   DILATION AND EVACUATION N/A 07/28/2015   Procedure: SUCTION, DILATATION AND EVACUATION;  Surgeon: Carlin LABOR Rudy, MD;  Location: [facility];  Service: Gynecology;  Laterality: N/A;  ESOPHAGOGASTRODUODENOSCOPY (EGD) WITH PROPOFOL  N/A 09/11/2023   Procedure: ESOPHAGOGASTRODUODENOSCOPY (EGD)  WITH PROPOFOL ;  Surgeon: [name], MD;  Location: [facility];  Service: Gastroenterology;  Laterality: N/A;   IR IMAGING GUIDED PORT INSERTION  10/20/2023   IR US  LIVER BIOPSY  10/20/2023   SUBMUCOSAL INJECTION  09/11/2023   Procedure: INJECTION, SUBMUCOSAL;  Surgeon: [name], MD;  Location: [facility];  Service: Gastroenterology;;   TONSILLECTOMY  AGE 38   TONSILLECTOMY AND ADENOIDECTOMY  AGEB 18    Social History   Socioeconomic History   Marital status: Single    Spouse name: Not on file   Number of children: 3   Years of education: 28   Highest education level: Not on file  Occupational History   Occupation: lift driver  Tobacco Use   Smoking status: Former    Current packs/day: 0.00    Types: Cigarettes    Quit date: 10/31/2009    Years since quitting: 14.3   Smokeless tobacco: Never  Vaping Use   Vaping status: Former  Substance and Sexual Activity   Alcohol use: No    Alcohol/week: 0.0 standard drinks of alcohol   Drug use: Yes    Types: Marijuana   Sexual activity: Yes    Partners: Male    Birth control/protection: None, Surgical    Comment: BTL 08/2016  Other Topics Concern   Not on file  Social History Narrative   Not on file   Social Drivers of Health   Financial Resource Strain: High Risk (10/09/2023)   Overall Financial Resource Strain (CARDIA)    Difficulty of Paying Living Expenses: Hard  Food Insecurity: Not on file  Transportation Needs: Not on file  Physical Activity: Not on file  Stress: Not on file  Social Connections: Not on file  Intimate Partner Violence: Not on file    Family History  Problem Relation Age of Onset   Asthma Mother    Hypertension Mother    Early death Mother        AGE 35   Colon cancer Maternal Grandmother 46       late 50s-early 60s   Liver disease Neg Hx    Esophageal cancer Neg Hx     Medications Prior to Admission  Medication Sig Dispense Refill Last Dose/Taking   doxycycline  (VIBRA -TABS) 100 MG  tablet Take 1 tablet (100 mg total) by mouth 2 (two) times daily. 60 tablet 1 Taking   Hydrocortisone Acetate (MEDI-CORTISONE EX) Apply 1 application  topically daily. Applied to face once daily   Past Week   lidocaine -prilocaine  (EMLA ) cream Apply to affected area once (Patient taking differently: Apply 1 Application topically daily as needed (prior to port being accessed.).) 30 g 3 Taking Differently   loratadine  (CLARITIN ) 10 MG tablet Take 1 tablet (10 mg total) by mouth daily. 90 tablet 0 02/16/2024 at  9:00 AM   magnesium  oxide (MAG-OX) 400 (240 Mg) MG tablet Take 1 tablet (400 mg total) by mouth 2 (two) times daily. 60 tablet 1 Past Week   NON FORMULARY Take 1 Dose by mouth daily. Sea [place]   Past Week   omeprazole  (PRILOSEC  OTC) 20 MG tablet Take 1 tablet (20 mg total) by mouth daily. 28 tablet 1 02/16/2024 at  9:00 AM   OVER THE COUNTER MEDICATION Take 2 each by mouth in the morning. Soursop Garviola Gummies   Past Week   potassium chloride  SA (KLOR-CON  M) 20 MEQ tablet Take 1 tablet (20 mEq total) by mouth daily.  30 tablet 0 02/16/2024 at  9:00 AM   Prenatal Vit-Fe Fumarate-FA (MULTIVITAMIN-PRENATAL) 27-0.8 MG TABS tablet Take 1 tablet by mouth in the morning.   Past Week    Current Facility-Administered Medications  Medication Dose Route Frequency Provider Last Rate Last Admin   acetaminophen  (TYLENOL ) tablet 1,000 mg  1,000 mg Oral On Call to OR [name], MD       alvimopan  (ENTEREG ) capsule 12 mg  12 mg Oral On Call to OR [name], MD       bupivacaine  liposome (EXPAREL ) 1.3 % injection 266 mg  20 mL Infiltration Once [name], MD       cefoTEtan  (CEFOTAN ) 2 g in sodium chloride  0.9 % 100 mL IVPB  2 g Intravenous On Call to OR [name], MD       celecoxib  (CELEBREX ) capsule 200 mg  200 mg Oral On Call to OR [name], MD       chlorhexidine  (PERIDEX ) 0.12 % solution 15 mL  15 mL Mouth/Throat Once Jerrye Sharper, MD       Or   Oral care mouth rinse  15 mL Mouth  Rinse Once Jerrye Sharper, MD       enoxaparin  (LOVENOX ) injection 40 mg  40 mg Subcutaneous Once [name], MD       gabapentin  (NEURONTIN ) capsule 200 mg  200 mg Oral On Call to OR [name], MD       lactated ringers  infusion   Intravenous Continuous Jerrye Sharper, MD         Allergies  Allergen Reactions   Fruit Extracts Itching    Fresh Fruit     BP 137/83   Pulse 98   Temp 99.5 F (37.5 C) (Oral)   Resp 16   Ht 5' 4 (1.626 m)   Wt 118.4 kg   LMP  (LMP Unknown) Comment: HCG negative 02/16/24  SpO2 99%   BMI 44.80 kg/m   Labs: Results for orders placed or performed during the hospital encounter of 02/16/24 (from the past 48 hours)  Pregnancy, urine POC     Status: None   Collection Time: 02/16/24 10:30 AM  Result Value Ref Range   Preg Test, Ur NEGATIVE NEGATIVE    Comment:        THE SENSITIVITY OF THIS METHODOLOGY IS >20 mIU/mL.     Imaging / Studies: No results found.   Briant KYM Sheldon, M.D., F.A.C.S. Gastrointestinal and Minimally Invasive Surgery [facility] [address], [address] [place], [place] [address] [phone] Main / Paging  02/16/2024 10:56 AM    Standing JAYSON Sheldon

## 2024-02-16 NOTE — Transfer of Care (Signed)
 Immediate Anesthesia Transfer of Care Note  Patient: Ashley [name]  Procedure(s) Performed: COLECTOMY, PARTIAL, ROBOT-ASSISTED, LAPAROSCOPIC  Patient Location: PACU  Anesthesia Type:General  Level of Consciousness: drowsy  Airway & Oxygen  Therapy: Patient connected to face mask oxygen   Post-op Assessment: Report given to RN and Post -op Vital signs reviewed and stable  Post vital signs: Reviewed and stable  Last Vitals:  Vitals Value Taken Time  BP 151/75 02/16/24 14:57  Temp    Pulse 97 02/16/24 14:58  Resp 26 02/16/24 14:58  SpO2 100 % 02/16/24 14:58  Vitals shown include unfiled device data.  Last Pain:  Vitals:   02/16/24 1033  TempSrc:   PainSc: 0-No pain         Complications: There were no known notable events for this encounter.

## 2024-02-16 NOTE — H&P (Signed)
 02/16/2024    REFERRING PHYSICIAN: [name], MD  Patient Care Team: Ilah Arthur BIRCH, MD as PCP - General (Family Medicine) Sheldon, Elspeth Bitter, MD as Consulting Provider (Colon and Rectal Surgery) Dasie Leonor Helling, MD as Consulting Provider (General Surgery) [name], MD (Medical Oncology) [name], MD (Gastroenterology)  PROVIDER: ELSPETH BITTER SHELDON, MD  [place] MRN: [id number] DOB: January 31, 1986  SUBJECTIVE   Chief Complaint: New Patient (Colon ca )   Ashley [name] is a 38 y.o. 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  History of Present Illness:  Pleasant 50 woman. Some intermittent crampy abdominal pain for several months. Had a CAT scan in October that showed some thickening of the descending colon. Persistent symptoms. Found out her grandmother was diagnosed with colon cancer. Underwent colonoscopy last month which showed a descending colon mass biopsy consistent with adenocarcinoma. Follow-up CAT scan shows a more obvious liver mass in the right central liver. MRI done confirms only metastasis. Saw Dr. Lanny with medical oncology. She requested colorectal and liver surgical surgical oncology consultations.  Patient is here today by herself. Patient seen by Dr. Dasie with surgical oncology/liver surgery as well as myself at the same time.  Patient normally moves her bowels once a day. She notes sometimes she gets some crampiness that is relieved with moving her bowels. No nausea vomiting or unbearable pain. No fevers or chills. She does not smoke. No diabetes. She is obese. No sleep apnea. She had a cholecystectomy by Dr. Belinda with our group in 2015. She has had 3 C-sections. No other abdominal surgeries. She claims she can walk at least 1/2-hour if not longer without difficulty. Some mild heartburn controlled with omeprazole .  Medical  History:  History reviewed. No pertinent past medical history.  Patient Active Problem List  Diagnosis  Adenocarcinoma of descending colon (CMS/HHS-HCC)  Colonic stricture (CMS/HHS-HCC)  Liver mass  Obesity, Class III, BMI 40-49.9 (morbid obesity) (CMS/HHS-HCC)  Thyromegaly  Abdominal pain  Fatty liver disease, nonalcoholic   Past Surgical History:  Procedure Laterality Date  CHOLECYSTECTOMY    No Known Allergies  Current Outpatient Medications on File Prior to Visit  Medication Sig Dispense Refill  omeprazole  (PRILOSEC ) 20 MG DR capsule Take 20 mg by mouth once daily  potassium chloride  (KLOR-CON  M20) 20 MEQ ER tablet Take 20 mEq by mouth 2 (two) times daily   No current facility-administered medications on file prior to visit.   History reviewed. No pertinent family history.   Social History   Tobacco Use  Smoking Status Former  Types: Cigarettes  Smokeless Tobacco Never    Social History   Socioeconomic History  Marital status: Single  Tobacco Use  Smoking status: Former  Types: Cigarettes  Smokeless tobacco: Never  Substance and Sexual Activity  Alcohol use: Never  Drug use: Yes  Comment: every now and then   Social Drivers of Health   Financial Resource Strain: High Risk (10/09/2023)  [facility] from [facility]  Overall Financial Resource Strain (CARDIA)  Difficulty of Paying Living Expenses: Hard   ############################################################  Review of Systems: A complete review of systems (ROS) was obtained from the patient.  We have reviewed this information and discussed as appropriate with the patient.  See HPI as well for other pertinent ROS.  Constitutional: No fevers, chills, sweats. Weight stable Eyes: No vision changes, No discharge HENT: No sore throats, nasal drainage  Lymph: No neck swelling, No bruising easily Pulmonary: No cough, productive sputum CV: No orthopnea, PND . No exertional chest/neck/shoulder/arm pain.  Patient can walk 30 minutes without difficulty.   GI: Colon cancer with liver mass suspicious for metastatic disease as noted in HPI. Grandmother with colon cancer. Otherwise no personal nor family history of GI cancer, inflammatory bowel disease, irritable bowel syndrome, allergy such as Celiac Sprue, dietary/dairy problems, colitis, ulcers nor gastritis. No recent sick contacts/gastroenteritis. No travel outside the country. No changes in diet.  Renal: No UTIs, No hematuria Genital: No drainage, bleeding, masses Musculoskeletal: No severe joint pain. Good ROM major joints Skin: No sores or lesions Heme/Lymph: No easy bleeding. No swollen lymph nodes Neuro: No active seizures. No facial droop Psych: No hallucinations. No agitation  OBJECTIVE   Vitals:  10/10/23 1138  BP: 126/80  Pulse: 82  Temp: 36.9 C (98.4 F)  SpO2: 98%  Weight: (!) 114.8 kg (253 lb)  Height: 162.6 cm (5' 4)  PainSc: 0-No pain   Body mass index is 43.43 kg/m.  PHYSICAL EXAM:  Constitutional: Not cachectic. Hygeine adequate. Vitals signs as above.  Eyes: Wears glasses - vision corrected,Pupils reactive, normal extraocular movements. Sclera nonicteric Neuro: CN II-XII intact. No major focal sensory defects. No major motor deficits. Lymph: No head/neck/groin lymphadenopathy Psych: No severe agitation. No severe anxiety. Judgment & insight Adequate, Oriented x4, HENT: Normocephalic, Mucus membranes moist. No thrush. Hearing: adequate Neck: Supple, No tracheal deviation. No obvious thyromegaly Chest: No pain to chest wall compression. Good respiratory excursion. No audible wheezing CV: Pulses intact. regular. No major extremity edema Ext: No obvious deformity or contracture. Edema: Not present. No cyanosis Skin: No major subcutaneous nodules. Warm and dry Musculoskeletal: Severe joint rigidity not present. No obvious clubbing. No digital petechiae. Mobility: no assist device moving easily without  restrictions  Abdomen: Obese with panniculus Soft. Nondistended. Nontender. Hernia: Not present. Diastasis recti: Not present. No hepatomegaly. No splenomegaly.  Genital/Pelvic: Inguinal hernia: Not present. Inguinal lymph nodes: without lymphadenopathy nor hidradenitis.   Rectal: (Deferred)    ###################################################################  Labs, Imaging and Diagnostic Testing:  Located in 'Care Everywhere' section of Epic EMR chart  PRIOR [facility] CLINIC NOTES:  Not applicable  SURGERY NOTES:  Not applicable  PATHOLOGY:  Located in 'Care Everywhere' section of Epic EMR chart  Assessment and Plan:  DIAGNOSES:  Diagnoses and all orders for this visit:  Adenocarcinoma of descending colon (CMS/HHS-HCC)  Colonic stricture (CMS/HHS-HCC)  Liver mass  Abdominal pain, unspecified abdominal location  Obesity, Class III, BMI 40-49.9 (morbid obesity) (CMS/HHS-HCC)  Fatty liver disease, nonalcoholic    ASSESSMENT/PLAN  Pleasant young morbidly obese woman with evidence of a descending colon cancer somewhat stricturing with mild cramping and solitary central deep 2.7 cm liver mass that has gotten larger in 5 months.  We had an extensive discussion with the patient, myself, my partner Dr. Leonor Dawn with surgical oncology/hepatobiliopancreatic surgery, Dr. Bronson with medical oncology, and second opinion by surgical oncologist Dr Romero at [facility].  Given the patient's young age decision has been to be aggressive.  Initially treated with upfront chemotherapy with no evidence of any progression or new disease.  Given the location of the liver metastasis, it is felt this will require significant liver resection.  Therefore a desire for preemptive segmental liver  embolization to help the contralateral lobe grow.  This will take some time, so wish to flip the order and proceed with colectomy first to minimize time off chemotherapy.  Robotic  resection.   Seems to mainly focus at splenic flexure with attempted intracorporeal anastomosis.  Robotic approach.  Once gets through that, then can reconsider hepatectomy at [facility] depending on response/progression and liver recovery.  The anatomy & physiology of the digestive tract was discussed.  The pathophysiology of the colon was discussed.  Natural history risks without surgery was discussed.   I feel the risks of no intervention will lead to serious problems that outweigh the operative risks; therefore, I recommended a partial colectomy to remove the pathology.  Minimally invasive (Robotic/Laparoscopic) & open techniques were discussed.   Risks such as bleeding, infection, abscess, leak, reoperation, injury to other organs, need for repair of tissues / organs, possible ostomy, hernia, heart attack, stroke, death, and other risks were discussed.  I noted a good likelihood this will help address the problem.   Goals of post-operative recovery were discussed as well.   Need for adequate nutrition, daily bowel regimen and healthy physical activity, to optimize recovery was noted as well. We will work to minimize complications.  Educational materials were available as well.  Questions were answered.  The patient expresses understanding & wishes to proceed with surgery.  Elspeth KYM Schultze, MD, FACS, MASCRS Esophageal, Gastrointestinal & Colorectal Surgery Robotic and Minimally Invasive Surgery  [facility] [address], [address] [place], [place] [address] [phone] Fax [phone] Main  CONTACT INFORMATION: Weekday (9AM-5PM): Call [facility] main office at [phone] Weeknight (5PM-9AM) or Weekend/Holiday: Check EPIC Web Links tab & use AMION (password  [email or web address]) for General Surgery [facility] coverage  Please, DO NOT use SecureChat  (it is not reliable communication to reach operating surgeons & will lead to a delay in care).   Epic staff  messaging available for outptient concerns needing 1-2 business day response.

## 2024-02-16 NOTE — Anesthesia Procedure Notes (Signed)
 Procedure Name: Intubation Date/Time: 02/16/2024 11:59 AM  Performed by: Christopher Comings, CRNAPre-anesthesia Checklist: Patient identified, Emergency Drugs available, Suction available and Patient being monitored Patient Re-evaluated:Patient Re-evaluated prior to induction Oxygen  Delivery Method: Circle system utilized Preoxygenation: Pre-oxygenation with 100% oxygen  Induction Type: IV induction Ventilation: Mask ventilation without difficulty Laryngoscope Size: 4 and Glidescope Grade View: Grade II Tube type: Oral Tube size: 7.0 mm Number of attempts: 1 Airway Equipment and Method: Stylet and Oral airway Placement Confirmation: ETT inserted through vocal cords under direct vision, positive ETCO2 and breath sounds checked- equal and bilateral Secured at: 22 cm Tube secured with: Tape Dental Injury: Teeth and Oropharynx as per pre-operative assessment

## 2024-02-16 NOTE — Anesthesia Preprocedure Evaluation (Addendum)
 Anesthesia Evaluation  Patient identified by MRN, date of birth, ID band Patient awake    Reviewed: Allergy & Precautions, NPO status , Patient's Chart, lab work & pertinent test results, reviewed documented beta blocker date and time   History of Anesthesia Complications Negative for: history of anesthetic complications  Airway Mallampati: III  TM Distance: >3 FB Neck ROM: Full   Comment: Large goiter Dental no notable dental hx.    Pulmonary neg COPD, former smoker, neg PE   breath sounds clear to auscultation       Cardiovascular (-) CAD, (-) Past MI, (-) Cardiac Stents and (-) CABG  Rhythm:Regular Rate:Normal     Neuro/Psych  Headaches, neg Seizures    GI/Hepatic ,GERD  ,,(+) neg Cirrhosis      Colon cancer   Endo/Other   Hyperthyroidism Class 4 obesityLarge goiter  Renal/GU Renal disease     Musculoskeletal   Abdominal   Peds  Hematology  (+) Blood dyscrasia, anemia 10.5/146   Anesthesia Other Findings   Reproductive/Obstetrics                              Anesthesia Physical Anesthesia Plan  ASA: 3  Anesthesia Plan: General   Post-op Pain Management:    Induction: Intravenous  PONV Risk Score and Plan: 2 and Ondansetron  and Dexamethasone   Airway Management Planned: Oral ETT and Video Laryngoscope Planned  Additional Equipment:   Intra-op Plan:   Post-operative Plan: Extubation in OR  Informed Consent: I have reviewed the patients History and Physical, chart, labs and discussed the procedure including the risks, benefits and alternatives for the proposed anesthesia with the patient or authorized representative who has indicated his/her understanding and acceptance.     Dental advisory given  Plan Discussed with: CRNA  Anesthesia Plan Comments:         Anesthesia Quick Evaluation

## 2024-02-16 NOTE — Op Note (Signed)
 02/16/2024  2:45 PM  PATIENT:  Ashley [name]  38 y.o. female  Patient Care Team: [name], MD as PCP - General (Family Medicine) [name], [name], RN as Oncology Nurse Navigator [name], MD as Consulting Physician (Gastroenterology) [name], MD as Consulting Physician (Medical Oncology) [name], MD as Consulting Physician (Obstetrics and Gynecology) [name], MD as Consulting Physician (General Surgery) [name], MD as Consulting Physician (General Surgery)  PRE-OPERATIVE DIAGNOSIS:  SPLENIC FLEXURE COLON CANCER  POST-OPERATIVE DIAGNOSIS:  PROXIMAL DESCENDING COLON CANCER  PROCEDURE:  -ROBOTIC, RESECTION OF SPLENIC FLEXURE OF COLON WITH ANASTOMOSIS, and -TRANSVERSUS ABDOMINIS PLANE (TAP) BLOCK - BILATERAL  SURGEON:  Standing KYM Sheldon, MD  ASSISTANT:  Bernarda Ned, MD  An experienced assistant was required given the standard of surgical care given the complexity of the case.  This assistant was needed for exposure, dissection, suction, tissue approximation, retraction, perception, etc  ANESTHESIA:  General endotracheal intubation anesthesia (GETA) and Regional TRANSVERSUS ABDOMINIS PLANE (TAP) nerve block -BILATERAL for perioperative & postoperative pain control at the level of the transverse abdominis & preperitoneal spaces along the flank at the anterior axillary line, from subcostal ridge to iliac crest under laparoscopic guidance provided with 60 mL of bupivicaine 0.25% with epinephrine   Estimated Blood Loss (EBL):   Total I/O In: 1800 [I.V.:1200; IV Piggyback:600] Out: 460 [Urine:410; Blood:50].   (See anesthesia record)  Delay start of Pharmacological VTE agent (>24hrs) due to concerns of significant anemia, surgical blood loss, or risk of bleeding?:  no  DRAINS: (None)  SPECIMEN:  Colon - splenic flexure -Double stitch - distal margin -double looped stitched - deep margin (on Gerota's fat of L kidney)  DISPOSITION OF SPECIMEN:   Pathology  COUNTS:  Sponge, needle, & instrument counts CORRECT at the conclusion of the case.      PLAN OF CARE: Admit to inpatient   PATIENT DISPOSITION:  PACU - hemodynamically stable.  INDICATION:    Pleasant young woman one found to have mass at splenic flexure of colon and liver mass.  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  Multi disciplinary discussion through [facility] and [facility].  Recommendation made to be aggressive with liver and colon resections.  Given need for poorly a stabilization to get liver ready for hepatectomy, request made to proceed with colectomy first while patient is off chemotherapy.  Discussed at length with patient and partners.  I recommended segmental resection:  The anatomy & physiology of the digestive tract was discussed.  The pathophysiology was discussed.  Natural history risks without surgery was discussed.   I worked to give an overview of the disease and the frequent need to have multispecialty involvement.  I feel the risks of no intervention will lead to serious problems that outweigh the operative risks; therefore, I recommended a partial colectomy to remove the pathology.  Laparoscopic & open techniques were discussed.   Risks such as bleeding, infection, abscess, leak, reoperation, possible ostomy, hernia, heart attack, death, and other risks were discussed.  I noted a good likelihood this will help address the problem.   Goals of post-operative recovery were discussed as well.  We will work to minimize complications.  An educational handout on the pathology was given as well.  Questions were answered.    The patient expresses understanding & wishes to proceed with surgery.  OR FINDINGS:   Patient had bulky fibrotic tumor in the proximal descending colon just distal to the splenic flexure.  A  few nodules of on the left lateral paracolic gutter nearby suspicious for oligo carcinomatosis.  No evidence of metastatic disease  elsewhere parietal peritoneum or liver.  It is an isoperistaltic Colo-colonic anastomosis (mid descending colon to sigmoid colon) that rests in the left lower quadrant region.  CASE DATA: Type of patient?: Elective [facility] Private Case Status of Case? Elective Scheduled Infection Present At Time Of Surgery (PATOS)?  NO   DESCRIPTION:   Informed consent was confirmed.  The patient underwent general anaesthesia without difficulty.  The patient was positioned with arms tucked & secured appropriately.  VTE prevention in place.  The patient's abdomen was clipped, prepped, & draped in a sterile fashion.  Surgical timeout confirmed our plan.  The patient was positioned in reverse Trendelenburg.  Abdominal entry was gained using Varess technique at the left subcostal ridge on the anterior abdominal wall.  No elevated EtCO2 noted.  Port placed.  Camera inspection revealed no injury.  Extra ports were carefully placed under direct laparoscopic visualization.  Omentum was freed from the periumbilical region.  We docked the [employer] Vinc[employer] robot carefully and placed intstruments under visualization  I mobilized & reflected the greater omentum in the upper abdomen.  Position the small bowel into the central and right side of the abdomen to better expose the left colon mesentery.  Easily encountered the nodular fibrotic mass in the proximal descending colon with tattooing in the mid/distal descending colon.  Also tattooing in the very distal transverse colon just proximal to splenic flexure.  Decided proceed with transection.  Freed greater omentum off the descending colon and splenic flexure.  Mobilized the left colon in a lateral medial fashion starting at the rectosigmoid region and came cephalad.  Noted the rocky fibrotic mass and there was some pale white plaquing on the surface of the tumor and about 5 small dot-like plaques on the parietal peritoneum on the left lateral gutter within 3 cm of the mass.  I  decided to transect the left lateral preperitoneal fat and peritoneum to have 2 cm grossly negative margins.  Came up to the splenic flexure and help mobilize some of the splenic flexure off the retroperitoneum.  Then turned attention to the transverse colon.  Transected the greater omentum off the mid transverse colon until we got into the lesser sac.  Freed greater curvature of the stomach adhesions to the distal transverse colon mesentery until he came to the splenic flexure.  Placed the distal transverse colon mesentery on axial tension inferiorly and transected through the colon mesentery off the inferior pancreatic ridge of the retroperitoneum just distal to a dominant left middle colic pedicle.  Came down until we could free the transverse colon mesentery and splenic flexure off the retroperitoneum including some of the start of Gerota's fascia.  Next with better mobility elevated the left colon pedicle.  Could see a left colic pedicle just distal to the most distal aspect of the descending colon cancer.  Superior hemorrhoidal pedicle went down more to the rectosigmoid.  I scored the mesentery at the base of the inferior mesenteric vein and IMA.  Transected the left colic off of the IMA pedicle.  Elevated the left colon mesentery off the retroperitoneum.  Took care to identify and preserve the left ureter and retroperitoneal structures.  The colon mass seem to be adherent to part of the fat pad of Gerota's on the left kidney.  We decided to transect the Gerota's fat pad upper two thirds off of the kidney to  have that go en bloc for a grossly negative margin.  Able to mobilize the splenic flexure off the retroperitoneum and kidney and splenic flexure and inferior to superior fashion.  Medial to lateral fashion.  Came around the bend and began to connect with the distal transverse colon mesenteric attachments to the retroperitoneum.  And then elevated the transverse colon mesentery cephalad and came  through the base of the mid/distal transverse colon mesentery just left lateral to abdominal and middle colic pedicle.  Found the window in the lesser sac and transected the distal transverse colon off the retroperitoneum taking Gerota's fat with it.  With that had good mobilization.  Transected the mesentery of the mid/distal transverse colon junction just distal to a dominant left middle colic pedicle.  Came to a soft area transverse colon that easily reached down to the left lower quadrant.  Transected the mesentery between the descending and sigmoid colon distal to the ligated left colic pedicle to an area at the descending/sigmoid colon junction.    We confirmed good viability of the sigmoid and transverse colon planned for anastomosis. When he went ahead and proceeded with transection.  We transected transverse colon and sigmoid colon with separate loads with a robotic stapler 60mm green load.  We confirmed hemostasis.   I did a side-to-side stapled anastomosis of sigmoid to mid-transverse colon using a 60mm white load in an isoperistaltic fashion.  (Distal stump of sigmoid to mid transverse colon for the distal end of the anastomosis.  Proximal end of sigmoid colon stump to more proximal mid-transverse colon for the proximal end of the anastomosis).  I sewed the common staple channel wound with an absorbable suture (V-lock) in a running [place] fashion from each corner and meeting in the center.  We did meticulous inspection prove an airtight closure.  I protected the anastomosis line with an anterior omentopexy of greater omentum using V lock suture.  Ensured small bowel was not trapped underneath the colon mesentery anastomosis  We did reinspection of the abdomen.  Hemostasis was good.   Ureters, retroperitoneum, and bowel uninjured.  The anastomosis looked healthy.   Endoluminal gas was evacuated.  We placed the wound protector through the suprapubic 12mm port site after it was enlarged in a  Pfannenstiel fashion.  Specimen removed without incident.   Ports & wound protector removed.  Hemostasis was good.  Sterile unused instruments were used from this point.  I closed the skin at the port sites using Monocryl stitch and sterile dressing.  I closed the extraction wound using a 0 Vicryl vertical peritoneal closure and a #1 PDS transverse anterior rectal fascial closure like a small Pfannenstiel closure. I closed the skin with some interrupted Monocryl stitches.  I placed sterile dressings.     Patient is being extubated go to recovery room. I discussed postop care with the patient in detail the office & in the holding area. Instructions are written. I discussed operative findings, updated the patient's status, discussed probable steps to recovery, and gave postoperative recommendations to the patient's significant other, Tanda Matt.  Recommendations were made.  Questions were answered.  He expressed understanding & appreciation.  Elspeth KYM Schultze, M.D., F.A.C.S. Gastrointestinal and Minimally Invasive Surgery [facility] [address] [place], [place] [address] [phone] Main / Paging

## 2024-02-16 NOTE — Plan of Care (Signed)

## 2024-02-16 NOTE — Discharge Instructions (Signed)
 SURGERY: POST OP INSTRUCTIONS (Surgery for small bowel obstruction, colon resection, etc)   ######################################################################  EAT Gradually transition to a high fiber diet with a fiber supplement over the next few days after discharge  WALK Walk an hour a day.  Control your pain to do that.    CONTROL PAIN Control pain so that you can walk, sleep, tolerate sneezing/coughing, go up/down stairs.  HAVE A BOWEL MOVEMENT DAILY Keep your bowels regular to avoid problems.  OK to try a laxative to override constipation.  OK to use an antidairrheal to slow down diarrhea.  Call if not better after 2 tries  CALL IF YOU HAVE PROBLEMS/CONCERNS Call if you are still struggling despite following these instructions. Call if you have concerns not answered by these instructions  ######################################################################   DIET Follow a light diet the first few days at home.  Start with a bland diet such as soups, liquids, starchy foods, low fat foods, etc.  If you feel full, bloated, or constipated, stay on a ful liquid or pureed/blenderized diet for a few days until you feel better and no longer constipated. Be sure to drink plenty of fluids every day to avoid getting dehydrated (feeling dizzy, not urinating, etc.). Gradually add a fiber supplement to your diet over the next week.  Gradually get back to a regular solid diet.  Avoid fast food or heavy meals the first week as you are more likely to get nauseated. It is expected for your digestive tract to need a few months to get back to normal.  It is common for your bowel movements and stools to be irregular.  You will have occasional bloating and cramping that should eventually fade away.  Until you are eating solid food normally, off all pain medications, and back to regular activities; your bowels will not be normal. Focus on eating a low-fat, high fiber diet the rest of your life  (See Getting to Good Bowel Health, below).  CARE of your INCISION or WOUND  It is good for closed incisions and even open wounds to be washed every day.  Shower every day.  Short baths are fine.  Wash the incisions and wounds clean with soap & water .    You may leave closed incisions open to air if it is dry.   You may cover the incision with clean gauze & replace it after your daily shower for comfort.  TEGADERM:  You have clear gauze band-aid dressings over your closed incision(s).  Remove the dressings 2 days after surgery = 8/17.    If you have an open wound with a wound vac, see wound vac care instructions.    ACTIVITIES as tolerated Start light daily activities --- self-care, walking, climbing stairs-- beginning the day after surgery.  Gradually increase activities as tolerated.  Control your pain to be active.  Stop when you are tired.  Ideally, walk several times a day, eventually an hour a day.   Most people are back to most day-to-day activities in a few weeks.  It takes 4-8 weeks to get back to unrestricted, intense activity. If you can walk 30 minutes without difficulty, it is safe to try more intense activity such as jogging, treadmill, bicycling, low-impact aerobics, swimming, etc. Save the most intensive and strenuous activity for last (Usually 4-8 weeks after surgery) such as sit-ups, heavy lifting, contact sports, etc.  Refrain from any intense heavy lifting or straining until you are off narcotics for pain control.  You will have off  days, but things should improve week-by-week. DO NOT PUSH THROUGH PAIN.  Let pain be your guide: If it hurts to do something, don't do it.  Pain is your body warning you to avoid that activity for another week until the pain goes down. You may drive when you are no longer taking narcotic prescription pain medication, you can comfortably wear a seatbelt, and you can safely make sudden turns/stops to protect yourself without hesitating due to  pain. You may have sexual intercourse when it is comfortable. If it hurts to do something, stop.   MEDICATIONS Take your usually prescribed home medications unless otherwise directed.    Blood thinners:  You can restart any strong blood thinners after the second postoperative day  for example: COUMADIN (warfarin), XERELTO (rivaroxaban), ELIQUIS  (apixaban ), PLAVIX (clopidigrel), BRILINTA (ticagrelor), EFFIENT (prasugrel), PRADAXA (dabigatran), etc  Continue aspirin  before & after surgery..     Some oozing/bleeding the first 1-2 weeks is common but should taper down & be small volume.    If you are passing many large clots or having uncontrolling bleeding, call your surgeon    PAIN CONTROL Pain after surgery or related to activity is often due to strain/injury to muscle, tendon, nerves and/or incisions.  This pain is usually short-term and will improve in a few months.  To help speed the process of healing and to get back to regular activity more quickly, DO THE FOLLOWING THINGS TOGETHER: Increase activity gradually.  DO NOT PUSH THROUGH PAIN Use Ice and/or Heat Try Gentle Massage and/or Stretching Take over the counter pain medication Take Narcotic prescription pain medication for more severe pain  Good pain control = faster recovery.  It is better to take more medicine to be more active than to stay in bed all day to avoid medications.  Increase activity gradually Avoid heavy lifting at first, then increase to lifting as tolerated over the next 6 weeks. Do not "push through" the pain.  Listen to your body and avoid positions and maneuvers than reproduce the pain.  Wait a few days before trying something more intense Walking an hour a day is encouraged to help your body recover faster and more safely.  Start slowly and stop when getting sore.  If you can walk 30 minutes without stopping or pain, you can try more intense activity (running, jogging, aerobics, cycling, swimming,  treadmill, sex, sports, weightlifting, etc.) Remember: If it hurts to do it, then don't do it! Use Ice and/or Heat You will have swelling and bruising around the incisions.  This will take several weeks to resolve. Ice packs or heating pads (6-8 times a day, 30-60 minutes at a time) will help sooth soreness & bruising. Some people prefer to use ice alone, heat alone, or alternate between ice & heat.  Experiment and see what works best for you.  Consider trying ice for the first few days to help decrease swelling and bruising; then, switch to heat to help relax sore spots and speed recovery. Shower every day.  Short baths are fine.  It feels good!  Keep the incisions and wounds clean with soap & water .   Try Gentle Massage and/or Stretching Massage at the area of pain many times a day Stop if you feel pain - do not overdo it Take over the counter pain medication This helps the muscle and nerve tissues become less irritable and calm down faster Choose ONE of the following over-the-counter anti-inflammatory medications: Acetaminophen  500mg  tabs (Tylenol ) 1-2 pills with every meal  and just before bedtime (avoid if you have liver problems or if you have acetaminophen  in you narcotic prescription) Naproxen  220mg  tabs (ex. Aleve , Naprosyn ) 1-2 pills twice a day (avoid if you have kidney, stomach, IBD, or bleeding problems) Ibuprofen  200mg  tabs (ex. Advil , Motrin ) 3-4 pills with every meal and just before bedtime (avoid if you have kidney, stomach, IBD, or bleeding problems) Take with food/snack several times a day as directed for at least 2 weeks to help keep pain / soreness down & more manageable. Take Narcotic prescription pain medication for more severe pain A prescription for strong pain control is often given to you upon discharge (for example: oxycodone /Percocet, hydrocodone /Norco/Vicodin, or tramadol /Ultram ) Take your pain medication as prescribed. Be mindful that most narcotic prescriptions  contain Tylenol  (acetaminophen ) as well - avoid taking too much Tylenol . If you are having problems/concerns with the prescription medicine (does not control pain, nausea, vomiting, rash, itching, etc.), please call us  [phone] to see if we need to switch you to a different pain medicine that will work better for you and/or control your side effects better. If you need a refill on your pain medication, you must call the office before 4 pm and on weekdays only.  By federal law, prescriptions for narcotics cannot be called into a pharmacy.  They must be filled out on paper & picked up from our office by the patient or authorized caretaker.  Prescriptions cannot be filled after 4 pm nor on weekends.    WHEN TO CALL US  [phone] Severe uncontrolled or worsening pain  Fever over 101 F (38.5 C) Concerns with the incision: Worsening pain, redness, rash/hives, swelling, bleeding, or drainage Reactions / problems with new medications (itching, rash, hives, nausea, etc.) Nausea and/or vomiting Difficulty urinating Difficulty breathing Worsening fatigue, dizziness, lightheadedness, blurred vision Other concerns If you are not getting better after two weeks or are noticing you are getting worse, contact our office [phone] for further advice.  We may need to adjust your medications, re-evaluate you in the office, send you to the emergency room, or see what other things we can do to help. The clinic staff is available to answer your questions during regular business hours (8:30am-5pm).  Please don't hesitate to call and ask to speak to one of our nurses for clinical concerns.    A surgeon from [facility] is always on call at the hospitals 24 hours/day If you have a medical emergency, go to the nearest emergency room or call 911.  FOLLOW UP in our office One the day of your discharge from the hospital (or the next business weekday), please call [facility] to set up  or confirm an appointment to see your surgeon in the office for a follow-up appointment.  Usually it is 2-3 weeks after your surgery.   If you have skin staples at your incision(s), let the office know so we can set up a time in the office for the nurse to remove them (usually around 10 days after surgery). Make sure that you call for appointments the day of discharge (or the next business weekday) from the hospital to ensure a convenient appointment time. IF YOU HAVE DISABILITY OR FAMILY LEAVE FORMS, BRING THEM TO THE OFFICE FOR PROCESSING.  DO NOT GIVE THEM TO YOUR DOCTOR.  [facility] [address], [address], [place], [place]  [address] ? [phone] - Main [phone] - Toll Free,  [phone] - Fax [email or web address]  GETTING TO GOOD BOWEL HEALTH. It is expected for your digestive tract to need a few months to get back to normal.  It is common for your bowel movements and stools to be irregular.  You will have occasional bloating and cramping that should eventually fade away.  Until you are eating solid food normally, off all pain medications, and back to regular activities; your bowels will not be normal.   Avoiding constipation The goal: ONE SOFT BOWEL MOVEMENT A DAY!    Drink plenty of fluids.  Choose water  first. TAKE A FIBER SUPPLEMENT EVERY DAY THE REST OF YOUR LIFE During your first week back home, gradually add back a fiber supplement every day Experiment which form you can tolerate.   There are many forms such as powders, tablets, wafers, gummies, etc Psyllium bran (Metamucil), methylcellulose (Citrucel), Miralax  or Glycolax , Benefiber, Flax Seed.  Adjust the dose week-by-week (1/2 dose/day to 6 doses a day) until you are moving your bowels 1-2 times a day.  Cut back the dose or try a different fiber product if it is giving you problems such as diarrhea or bloating. Sometimes a laxative is needed to help jump-start bowels if  constipated until the fiber supplement can help regulate your bowels.  If you are tolerating eating & you are farting, it is okay to try a gentle laxative such as double dose MiraLax , prune juice, or Milk of Magnesia.  Avoid using laxatives too often. Stool softeners can sometimes help counteract the constipating effects of narcotic pain medicines.  It can also cause diarrhea, so avoid using for too long. If you are still constipated despite taking fiber daily, eating solids, and a few doses of laxatives, call our office. Controlling diarrhea Try drinking liquids and eating bland foods for a few days to avoid stressing your intestines further. Avoid dairy products (especially milk & ice cream) for a short time.  The intestines often can lose the ability to digest lactose when stressed. Avoid foods that cause gassiness or bloating.  Typical foods include beans and other legumes, cabbage, broccoli, and dairy foods.  Avoid greasy, spicy, fast foods.  Every person has some sensitivity to other foods, so listen to your body and avoid those foods that trigger problems for you. Probiotics (such as active yogurt, Align, etc) may help repopulate the intestines and colon with normal bacteria and calm down a sensitive digestive tract Adding a fiber supplement gradually can help thicken stools by absorbing excess fluid and retrain the intestines to act more normally.  Slowly increase the dose over a few weeks.  Too much fiber too soon can backfire and cause cramping & bloating. It is okay to try and slow down diarrhea with a few doses of antidiarrheal medicines.   Bismuth subsalicylate (ex. Kayopectate, Pepto Bismol) for a few doses can help control diarrhea.  Avoid if pregnant.   Loperamide (Imodium) can slow down diarrhea.  Start with one tablet (2mg ) first.  Avoid if you are having fevers or severe pain.  ILEOSTOMY PATIENTS WILL HAVE CHRONIC DIARRHEA since their colon is not in use.    Drink plenty of liquids.   You will need to drink even more glasses of water /liquid a day to avoid getting dehydrated. Record output from your ileostomy.  Expect to empty the bag every 3-4 hours at first.  Most people with a permanent ileostomy empty their bag 4-6 times at the least.   Use antidiarrheal medicine (especially Imodium) several times a day to avoid getting dehydrated.  Start with a dose at bedtime & breakfast.  Adjust up or down as needed.  Increase antidiarrheal medications as directed to avoid emptying the bag more than 8 times a day (every 3 hours). Work with your wound ostomy nurse to learn care for your ostomy.  See ostomy care instructions. TROUBLESHOOTING IRREGULAR BOWELS 1) Start with a soft & bland diet. No spicy, greasy, or fried foods.  2) Avoid gluten/wheat or dairy products from diet to see if symptoms improve. 3) Miralax  17gm or flax seed mixed in 8oz. water or juice-daily. May use 2-4 times a day as needed. 4) Gas-X, Phazyme, etc. as needed for gas & bloating.  5) Prilosec  (omeprazole ) over-the-counter as needed 6)  Consider probiotics (Align, Activa, etc) to help calm the bowels down  Call your doctor if you are getting worse or not getting better.  Sometimes further testing (cultures, endoscopy, X-ray studies, CT scans, bloodwork, etc.) may be needed to help diagnose and treat the cause of the diarrhea. [facility] [address], [place], [place]  [address] [phone] - Main.    [phone]  - Toll Free.   [phone] - Fax [email or web address]

## 2024-02-17 LAB — CBC
HCT: 27.2 % — ABNORMAL LOW (ref 36.0–46.0)
Hemoglobin: 8.4 g/dL — ABNORMAL LOW (ref 12.0–15.0)
MCH: 26.1 pg (ref 26.0–34.0)
MCHC: 30.9 g/dL (ref 30.0–36.0)
MCV: 84.5 fL (ref 80.0–100.0)
Platelets: 212 K/uL (ref 150–400)
RBC: 3.22 MIL/uL — ABNORMAL LOW (ref 3.87–5.11)
RDW: 18.3 % — ABNORMAL HIGH (ref 11.5–15.5)
WBC: 9.8 K/uL (ref 4.0–10.5)
nRBC: 0 % (ref 0.0–0.2)

## 2024-02-17 LAB — BASIC METABOLIC PANEL WITH GFR
Anion gap: 8 (ref 5–15)
BUN: 7 mg/dL (ref 6–20)
CO2: 25 mmol/L (ref 22–32)
Calcium: 8.9 mg/dL (ref 8.9–10.3)
Chloride: 102 mmol/L (ref 98–111)
Creatinine, Ser: 0.58 mg/dL (ref 0.44–1.00)
GFR, Estimated: >60 mL/min (ref >60)
Glucose, Bld: 135 mg/dL — ABNORMAL HIGH (ref 70–99)
Potassium: 4.2 mmol/L (ref 3.5–5.1)
Sodium: 135 mmol/L (ref 135–145)

## 2024-02-17 LAB — MAGNESIUM: Magnesium: 1.8 mg/dL (ref 1.7–2.4)

## 2024-02-17 MED ORDER — OXYCODONE HCL 5 MG PO TABS
5.0000 mg | ORAL_TABLET | ORAL | Status: DC | PRN
  Administered 2024-02-17 – 2024-02-19 (×7): 10 mg via ORAL
  Filled 2024-02-17 (×7): qty 2

## 2024-02-17 NOTE — Progress Notes (Signed)
 1 Day Post-Op Robotic partial colectomy  Subjective: Having some lower abd pain, no flatus or BM yet  Objective: Vital signs in last 24 hours: Temp:  [97.4 F (36.3 C)-99.5 F (37.5 C)] 98 F (36.7 C) (08/16 0703) Pulse Rate:  [82-98] 94 (08/16 0113) Resp:  [14-22] 19 (08/16 0703) BP: (105-172)/(73-98) 125/82 (08/16 0703) SpO2:  [94 %-100 %] 100 % (08/16 0703) Weight:  [118.4 kg-121.8 kg] 121.8 kg (08/16 0500)   Intake/Output from previous day: 08/15 0701 - 08/16 0700 In: 2389.8 [P.O.:360; I.V.:1429.8; IV Piggyback:600] Out: 2210 [Urine:2160; Blood:50] Intake/Output this shift: No intake/output data recorded.   General appearance: alert and cooperative GI: normal findings: soft, mild distention  Incision: no significant drainage  Lab Results:  Recent Labs    02/17/24 0448  WBC 9.8  HGB 8.4*  HCT 27.2*  PLT 212   BMET Recent Labs    02/17/24 0448  NA 135  K 4.2  CL 102  CO2 25  GLUCOSE 135*  BUN 7  CREATININE 0.58  CALCIUM  8.9   PT/INR No results for input(s): LABPROT, INR in the last 72 hours. ABG No results for input(s): PHART, HCO3 in the last 72 hours.  Invalid input(s): PCO2, PO2  MEDS, Scheduled  acetaminophen   1,000 mg Oral Q6H   alvimopan   12 mg Oral BID   enoxaparin  (LOVENOX ) injection  40 mg Subcutaneous Q24H   feeding supplement  237 mL Oral BID BM   gabapentin   200 mg Oral QHS   loratadine   10 mg Oral Daily   omeprazole   20 mg Oral Daily   polycarbophil  625 mg Oral BID   prenatal multivitamin  1 tablet Oral q AM   sodium chloride  flush  3 mL Intravenous Q12H    Studies/Results: No results found.  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

## 2024-02-17 NOTE — Plan of Care (Signed)
  Problem: Education: Goal: Understanding of discharge needs will improve Outcome: Progressing   Problem: Activity: Goal: Ability to tolerate increased activity will improve Outcome: Progressing   Problem: Bowel/Gastric: Goal: Gastrointestinal status for postoperative course will improve Outcome: Progressing   Problem: Nutritional: Goal: Will attain and maintain optimal nutritional status will improve Outcome: Progressing   Problem: Education: Goal: Knowledge of General Education information will improve Description: Including pain rating scale, medication(s)/side effects and non-pharmacologic comfort measures Outcome: Progressing   Problem: Clinical Measurements: Goal: Ability to maintain clinical measurements within normal limits will improve Outcome: Progressing

## 2024-02-18 NOTE — Plan of Care (Signed)
  Problem: Activity: Goal: Ability to tolerate increased activity will improve Outcome: Progressing   Problem: Bowel/Gastric: Goal: Gastrointestinal status for postoperative course will improve Outcome: Progressing   Problem: Nutritional: Goal: Will attain and maintain optimal nutritional status will improve Outcome: Progressing   Problem: Activity: Goal: Risk for activity intolerance will decrease Outcome: Progressing   Problem: Nutrition: Goal: Adequate nutrition will be maintained Outcome: Progressing   Problem: Pain Managment: Goal: General experience of comfort will improve and/or be controlled Outcome: Progressing

## 2024-02-18 NOTE — Progress Notes (Signed)
 2 Days Post-Op Robotic partial colectomy  Subjective: Having some lower abd pain, no flatus or BM yet, tolerating fulls without nausea  Objective: Vital signs in last 24 hours: Temp:  [97.8 F (36.6 C)-98.2 F (36.8 C)] 98.2 F (36.8 C) (08/17 0544) Pulse Rate:  [97-106] 104 (08/17 0544) Resp:  [17-18] 17 (08/17 0544) BP: (116-134)/(72-74) 116/72 (08/17 0544) SpO2:  [99 %-100 %] 99 % (08/17 0544)   Intake/Output from previous day: 08/16 0701 - 08/17 0700 In: 1260 [P.O.:1260] Out: -  Intake/Output this shift: No intake/output data recorded.   General appearance: alert and cooperative GI: normal findings: soft, mild distention  Incision: no significant drainage  Lab Results:  Recent Labs    02/17/24 0448  WBC 9.8  HGB 8.4*  HCT 27.2*  PLT 212   BMET Recent Labs    02/17/24 0448  NA 135  K 4.2  CL 102  CO2 25  GLUCOSE 135*  BUN 7  CREATININE 0.58  CALCIUM  8.9   PT/INR No results for input(s): LABPROT, INR in the last 72 hours. ABG No results for input(s): PHART, HCO3 in the last 72 hours.  Invalid input(s): PCO2, PO2  MEDS, Scheduled  acetaminophen   1,000 mg Oral Q6H   alvimopan   12 mg Oral BID   enoxaparin  (LOVENOX ) injection  40 mg Subcutaneous Q24H   feeding supplement  237 mL Oral BID BM   gabapentin   200 mg Oral QHS   loratadine   10 mg Oral Daily   omeprazole   20 mg Oral Daily   polycarbophil  625 mg Oral BID   prenatal multivitamin  1 tablet Oral q AM   sodium chloride  flush  3 mL Intravenous Q12H    Studies/Results: No results found.  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) 02/16/2024   Genetic testing 11/10/2023   Port-A-Cath in place 10/24/2023   Subclinical hyperthyroidism 10/09/2023   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   Colonic stricture due to descnding  colon cancer 10/05/2023   Liver mass in setting of colon cancer 10/05/2023   Obesity, Class III, BMI 40-49.9 (morbid obesity) 10/05/2023   Adenocarcinoma of descending colon (HCC) 10/01/2023   Abdominal pain 09/11/2023   Irregular periods/menstrual cycles 01/30/2012   Obesity 11/13/2011    Expected post op course  Plan: Advance diet as tolerated Ambulate in hall   LOS: 2 days     .Bernarda JAYSON Ned, MD [facility], [place]    02/18/2024 8:14 AM

## 2024-02-18 NOTE — Anesthesia Postprocedure Evaluation (Signed)
 Anesthesia Post Note  Patient: Ashley [name]  Procedure(s) Performed: COLECTOMY, PARTIAL, ROBOT-ASSISTED, LAPAROSCOPIC     Patient location during evaluation: PACU Anesthesia Type: General Level of consciousness: awake and alert Pain management: pain level controlled Vital Signs Assessment: post-procedure vital signs reviewed and stable Respiratory status: spontaneous breathing, nonlabored ventilation, respiratory function stable and patient connected to nasal cannula oxygen  Cardiovascular status: blood pressure returned to baseline and stable Postop Assessment: no apparent nausea or vomiting Anesthetic complications: no   There were no known notable events for this encounter.              Lynwood MARLA Cornea

## 2024-02-19 ENCOUNTER — Other Ambulatory Visit: Payer: Self-pay

## 2024-02-19 MED ORDER — METHOCARBAMOL 500 MG PO TABS: 500.0000 mg | ORAL_TABLET | Freq: Three times a day (TID) | ORAL | 1 refills | Status: AC | PRN

## 2024-02-19 MED ORDER — OXYCODONE-ACETAMINOPHEN 5-325 MG PO TABS: 1.0000 | ORAL_TABLET | Freq: Four times a day (QID) | ORAL | 0 refills | Status: DC | PRN

## 2024-02-19 NOTE — Consult Note (Addendum)
 WOC Nurse ostomy consult note Pt was marked 08/08 due a possible ostomy on robotic operation, partial colectomy.  No ostomy needed.  Assessed incisions, no drainage or signs of inflammatory process. Dressings clean and dry.  WOC team will not plan to follow further. Please reconsult if further assistance is needed. Thank-you,  Lela Holm RN, CNS, [employer], MSN.  (Phone [phone])

## 2024-02-19 NOTE — Progress Notes (Signed)
 Pharmacy Brief Note - Alvimopan  (Entereg )  The standing order set for alvimopan  (Entereg ) now includes an automatic order to discontinue the drug after the patient has had a bowel movement. The change was approved by the Pharmacy & Therapeutics Committee and the Medical Executive Committee.   This patient has had a bowel movement and flatulence per nursing. Therefore, alvimopan  has been discontinued. If there are questions, please contact the pharmacy at [phone].   Thank you for allowing pharmacy to be a part of this patient's care.  Marget Hench, PharmD Clinical Pharmacist 02/19/2024 8:59 AM

## 2024-02-19 NOTE — Progress Notes (Signed)
 Patient discharged home, IV removed, discharge paperwork provided and explained, patient verbalized understanding.

## 2024-02-19 NOTE — Discharge Summary (Signed)
 Physician Discharge Summary    Ashley [name] MRN: [id number] DOB/AGE: Mar 25, 1986 = 38 y.o.  Patient Care Team: [name], MD as PCP - General (Family Medicine) [name], [name], RN as Oncology Nurse Navigator [name], MD as Consulting Physician (Gastroenterology) [name], MD as Consulting Physician (Medical Oncology) [name], MD as Consulting Physician (Obstetrics and Gynecology) [name], MD as Consulting Physician (General Surgery) [name], MD as Consulting Physician (General Surgery)  Admit date: 02/16/2024  Discharge date: 02/19/2024  Hospital Stay = 3 days    Discharge Diagnoses:  Principal Problem:   Adenocarcinoma of descending colon [facility]) Active Problems:   Liver mass in setting of colon cancer   Obesity, Class III, BMI 40-49.9 (morbid obesity)   Port-A-Cath in place   3 Days Post-Op  02/16/2024  POST-OPERATIVE DIAGNOSIS:  PROXIMAL DESCENDING COLON CANCER   PROCEDURE:   -ROBOTIC, RESECTION OF SPLENIC FLEXURE OF COLON WITH ANASTOMOSIS  -TRANSVERSUS ABDOMINIS PLANE (TAP) BLOCK - BILATERAL   SURGEON:  Standing KYM Sheldon, MD  OR FINDINGS:   Patient had bulky fibrotic tumor in the proximal descending colon just distal to the splenic flexure.  A few nodules of on the left lateral paracolic gutter nearby suspicious for oligo carcinomatosis.  No evidence of metastatic disease elsewhere parietal peritoneum or liver.   It is an isoperistaltic Colo-colonic anastomosis (mid descending colon to sigmoid colon) that rests in the left lower quadrant region.     Consults: Case Management / Social Work, Physical Therapy, Wound ostomy consult nurse (WOCN), and Anesthesia  Hospital Course:   The patient underwent the surgery above.  Postoperatively, the patient gradually mobilized and advanced to a solid diet.  Pain and other symptoms were treated aggressively.    By the time of discharge, the patient was walking well the hallways, eating  food, having flatus.  Pain was well-controlled on an oral medications.  Based on meeting discharge criteria and continuing to recover, I felt it was safe for the patient to be discharged from the hospital to further recover with close followup. Postoperative recommendations were discussed in detail.  They are written as well.  Discharged Condition: good  Discharge Exam: Blood pressure 116/76, pulse (!) 107, temperature 98.2 F (36.8 C), temperature source Oral, resp. rate 18, height 5' 4 (1.626 m), weight 122 kg, SpO2 100%.  General: Pt awake/alert/oriented x4 in No acute distress Eyes: PERRL, normal EOM.  Sclera clear.  No icterus Neuro: CN II-XII intact w/o focal sensory/motor deficits. Lymph: No head/neck/groin lymphadenopathy Psych:  No delerium/psychosis/paranoia HENT: Normocephalic, Mucus membranes moist.  No thrush Neck: Supple, No tracheal deviation Chest: No chest wall pain w good excursion CV:  Pulses intact.  Regular rhythm MS: Normal AROM mjr joints.  No obvious deformity Abdomen: Soft.  Nondistended.  Mildly tender at incisions only.  No evidence of peritonitis.  No incarcerated hernias. Ext:  SCDs BLE.  No mjr edema.  No cyanosis Skin: No petechiae / purpura   Disposition:    Follow-up Information     [name], MD Follow up on 03/11/2024.   Specialties: General Surgery, Colon and Rectal Surgery Why: To follow up after your operation Contact information: [address] [place] [address] [phone]                 Discharge disposition: 01-Home or Self Care       Discharge Instructions     Call MD for:   Complete by: As directed  FEVER > 101.5 F  (temperatures < 101.5 F are not significant)   Call MD for:  extreme fatigue   Complete by: As directed    Call MD for:  persistant dizziness or light-headedness   Complete by: As directed    Call MD for:  persistant nausea and vomiting   Complete by: As directed    Call MD  for:  redness, tenderness, or signs of infection (pain, swelling, redness, odor or green/yellow discharge around incision site)   Complete by: As directed    Call MD for:  severe uncontrolled pain   Complete by: As directed    Diet - low sodium heart healthy   Complete by: As directed    Start with a bland diet such as soups, liquids, starchy foods, low fat foods, etc. the first few days at home. Gradually advance to a solid, low-fat, high fiber diet by the end of the first week at home.   Add a fiber supplement to your diet (Metamucil, etc) If you feel full, bloated, or constipated, stay on a full liquid or pureed/blenderized diet for a few days until you feel better and are no longer constipated.   Discharge instructions   Complete by: As directed    See Discharge Instructions If you are not getting better after two weeks or are noticing you are getting worse, contact our office [phone] for further advice.  We may need to adjust your medications, re-evaluate you in the office, send you to the emergency room, or see what other things we can do to help. The clinic staff is available to answer your questions during regular business hours (8:30am-5pm).  Please don't hesitate to call and ask to speak to one of our nurses for clinical concerns.    A surgeon from [facility] is always on call at the hospitals 24 hours/day If you have a medical emergency, go to the nearest emergency room or call 911.   Discharge wound care:   Complete by: As directed    It is good for closed incisions and even open wounds to be washed every day.  Shower every day.  Short baths are fine.  Wash the incisions and wounds clean with soap & water.    You may leave closed incisions open to air if it is dry.   You may cover the incision with clean gauze & replace it after your daily shower for comfort.  TEGADERM:  You have clear gauze band-aid dressings over your closed incision(s).  Remove the dressings  2 days after surgery = Sunday 8/17   Driving Restrictions   Complete by: As directed    You may drive when: - you are no longer taking narcotic prescription pain medication - you can comfortably wear a seatbelt - you can safely make sudden turns/stops without pain.   Increase activity slowly   Complete by: As directed    Start light daily activities --- self-care, walking, climbing stairs- beginning the day after surgery.  Gradually increase activities as tolerated.  Control your pain to be active.  Stop when you are tired.  Ideally, walk several times a day, eventually an hour a day.   Most people are back to most day-to-day activities in a few weeks.  It takes 4-6 weeks to get back to unrestricted, intense activity. If you can walk 30 minutes without difficulty, it is safe to try more intense activity such as jogging, treadmill, bicycling, low-impact aerobics, swimming, etc. Save the most  intensive and strenuous activity for last (Usually 4-8 weeks after surgery) such as sit-ups, heavy lifting, contact sports, etc.  Refrain from any intense heavy lifting or straining until you are off narcotics for pain control.  You will have off days, but things should improve week-by-week. DO NOT PUSH THROUGH PAIN.  Let pain be your guide: If it hurts to do something, don't do it.   Lifting restrictions   Complete by: As directed    If you can walk 30 minutes without difficulty, it is safe to try more intense activity such as jogging, treadmill, bicycling, low-impact aerobics, swimming, etc. Save the most intensive and strenuous activity for last (Usually 4-8 weeks after surgery) such as sit-ups, heavy lifting, contact sports, etc.   Refrain from any intense heavy lifting or straining until you are off narcotics for pain control.  You will have off days, but things should improve week-by-week. DO NOT PUSH THROUGH PAIN.  Let pain be your guide: If it hurts to do something, don't do it.  Pain is your body  warning you to avoid that activity for another week until the pain goes down.   May shower / Bathe   Complete by: As directed    May walk up steps   Complete by: As directed    Remove dressing in 48 hours   Complete by: As directed    Make sure all dressings have been removed on the second day after surgery = 8/17 Sunday Leave incisions open to air.  OK to cover incisions with gauze or bandages as desired   Sexual Activity Restrictions   Complete by: As directed    You may have sexual intercourse when it is comfortable. If it hurts to do something, stop.       Allergies as of 02/19/2024       Reactions   Fruit Extracts Itching   Fresh Fruit         Medication List     TAKE these medications    doxycycline  100 MG tablet Commonly known as: VIBRA -TABS Take 1 tablet (100 mg total) by mouth 2 (two) times daily.   lidocaine -prilocaine  cream Commonly known as: EMLA  Apply to affected area once What changed:  how much to take how to take this when to take this reasons to take this additional instructions   loratadine  10 MG tablet Commonly known as: Claritin  Take 1 tablet (10 mg total) by mouth daily.   magnesium  oxide 400 (240 Mg) MG tablet Commonly known as: MAG-OX Take 1 tablet (400 mg total) by mouth 2 (two) times daily.   MEDI-CORTISONE EX Apply 1 application  topically daily. Applied to face once daily   methocarbamol  500 MG tablet Commonly known as: ROBAXIN  Take 1 tablet (500 mg total) by mouth every 8 (eight) hours as needed for muscle spasms.   multivitamin-prenatal 27-0.8 MG Tabs tablet Take 1 tablet by mouth in the morning.   NON FORMULARY Take 1 Dose by mouth daily. [place]   omeprazole  20 MG tablet Commonly known as: PriLOSEC  OTC Take 1 tablet (20 mg total) by mouth daily.   OVER THE COUNTER MEDICATION Take 2 each by mouth in the morning. Soursop Garviola Gummies   oxyCODONE -acetaminophen  5-325 MG tablet Commonly known as: Percocet Take 1  tablet by mouth every 6 (six) hours as needed for severe pain (pain score 7-10) or moderate pain (pain score 4-6). Can increase to 2 pills at a time as needed   potassium chloride  SA 20  MEQ tablet Commonly known as: KLOR-CON  M Take 1 tablet (20 mEq total) by mouth daily.               Discharge Care Instructions  (From admission, onward)           Start     Ordered   02/16/24 0000  Discharge wound care:       Comments: It is good for closed incisions and even open wounds to be washed every day.  Shower every day.  Short baths are fine.  Wash the incisions and wounds clean with soap & water.    You may leave closed incisions open to air if it is dry.   You may cover the incision with clean gauze & replace it after your daily shower for comfort.  TEGADERM:  You have clear gauze band-aid dressings over your closed incision(s).  Remove the dressings 2 days after surgery = Sunday 8/17   02/16/24 1137            Significant Diagnostic Studies:  Results for orders placed or performed during the hospital encounter of 02/16/24 (from the past 72 hours)  Pregnancy, urine POC     Status: None   Collection Time: 02/16/24 10:30 AM  Result Value Ref Range   Preg Test, Ur NEGATIVE NEGATIVE    Comment:        THE SENSITIVITY OF THIS METHODOLOGY IS >20 mIU/mL.   Basic metabolic panel     Status: Abnormal   Collection Time: 02/17/24  4:48 AM  Result Value Ref Range   Sodium 135 135 - 145 mmol/L   Potassium 4.2 3.5 - 5.1 mmol/L   Chloride 102 98 - 111 mmol/L   CO2 25 22 - 32 mmol/L   Glucose, Bld 135 (H) 70 - 99 mg/dL    Comment: Glucose reference range applies only to samples taken after fasting for at least 8 hours.   BUN 7 6 - 20 mg/dL   Creatinine, Ser 9.41 0.44 - 1.00 mg/dL   Calcium  8.9 8.9 - 10.3 mg/dL   GFR, Estimated >39 >39 mL/min    Comment: (NOTE) Calculated using the CKD-EPI Creatinine Equation (2021)    Anion gap 8 5 - 15    Comment: Performed at [facility], [address], [place], [place] [address]  CBC     Status: Abnormal   Collection Time: 02/17/24  4:48 AM  Result Value Ref Range   WBC 9.8 4.0 - 10.5 K/uL   RBC 3.22 (L) 3.87 - 5.11 MIL/uL   Hemoglobin 8.4 (L) 12.0 - 15.0 g/dL   HCT 72.7 (L) 63.9 - 53.9 %   MCV 84.5 80.0 - 100.0 fL   MCH 26.1 26.0 - 34.0 pg   MCHC 30.9 30.0 - 36.0 g/dL   RDW 81.6 (H) 88.4 - 84.4 %   Platelets 212 150 - 400 K/uL   nRBC 0.0 0.0 - 0.2 %    Comment: Performed at [facility], [address] [place], [place] [address]  Magnesium      Status: None   Collection Time: 02/17/24  4:48 AM  Result Value Ref Range   Magnesium  1.8 1.7 - 2.4 mg/dL    Comment: Performed at [facility], [address], [place], [place] [address]    No results found.  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   Irregular periods/menstrual cycles 01/30/2012   Late prenatal care 01/30/2012   Poor social situation 01/30/2012   Previous cesarean section complicating pregnancy, antepartum condition or complication 01/27/2016   2 previous cesarean section- Desires repeat with BTL   Single liveborn, born in hospital, delivered by cesarean delivery 08/13/2016   Status post repeat low transverse cesarean section 08/14/2016   Status post tubal ligation at time of delivery, current hosp 08/14/2016    Past Surgical History:  Procedure Laterality Date   CESAREAN SECTION  2009   CESAREAN SECTION  01/30/2012   Procedure: CESAREAN SECTION;  Surgeon: Ovid DELENA All, MD;  Location: [facility];  Service: Gynecology;  Laterality: N/A;  Repeat C/S   CESAREAN SECTION WITH BILATERAL TUBAL LIGATION Bilateral 08/13/2016   Procedure: CESAREAN SECTION WITH BILATERAL TUBAL LIGATION;  Surgeon: Burnard VEAR Pate, MD;  Location: [facility] SU[facility];   Service: Obstetrics;  Laterality: Bilateral;   CHOLECYSTECTOMY N/A 10/16/2013   Procedure: LAPAROSCOPIC CHOLECYSTECTOMY WITH INTRAOPERATIVE CHOLANGIOGRAM;  Surgeon: Donnice POUR. Belinda, MD;  Location: [facility] OR;  Service: General;  Laterality: N/A;   COLONOSCOPY WITH PROPOFOL  N/A 09/11/2023   Procedure: COLONOSCOPY WITH PROPOFOL ;  Surgeon: [name], MD;  Location: [facility];  Service: Gastroenterology;  Laterality: N/A;   DILATION AND EVACUATION N/A 07/28/2015   Procedure: SUCTION, DILATATION AND EVACUATION;  Surgeon: Carlin DELENA Centers, MD;  Location: [facility];  Service: Gynecology;  Laterality: N/A;   ESOPHAGOGASTRODUODENOSCOPY (EGD) WITH PROPOFOL  N/A 09/11/2023   Procedure: ESOPHAGOGASTRODUODENOSCOPY (EGD) WITH PROPOFOL ;  Surgeon: [name], MD;  Location: [facility];  Service: Gastroenterology;  Laterality: N/A;   IR IMAGING GUIDED PORT INSERTION  10/20/2023   IR US  LIVER BIOPSY  10/20/2023   SUBMUCOSAL INJECTION  09/11/2023   Procedure: INJECTION, SUBMUCOSAL;  Surgeon: [name], MD;  Location: [facility];  Service: Gastroenterology;;   TONSILLECTOMY  AGE 45   TONSILLECTOMY AND ADENOIDECTOMY  AGEB 18    Social History   Socioeconomic History   Marital status: Single    Spouse name: Not on file   Number of children: 3   Years of education: 47   Highest education level: Not on file  Occupational History   Occupation: lift driver  Tobacco Use   Smoking status: Former    Current packs/day: 0.00    Types: Cigarettes    Quit date: 10/31/2009    Years since quitting: 14.3   Smokeless tobacco: Never  Vaping Use   Vaping status: Former  Substance and Sexual Activity   Alcohol use: No    Alcohol/week: 0.0 standard drinks of alcohol   Drug use: Yes    Types: Marijuana   Sexual activity: Yes    Partners: Male    Birth control/protection: None, Surgical    Comment: BTL 08/2016  Other Topics Concern   Not on file  Social History Narrative   Not on file   Social Drivers of  Health   Financial Resource Strain: High Risk (10/09/2023)   Overall Financial Resource Strain (CARDIA)    Difficulty of Paying Living Expenses: Hard  Food Insecurity: No Food Insecurity (02/16/2024)   Hunger Vital Sign    Worried About Running Out of Food in the Last Year: Never true    Ran Out of Food in the Last Year: Never true  Transportation Needs: No Transportation Needs (02/16/2024)   PRAPARE - Administrator, Civil Service (Medical):  No    Lack of Transportation (Non-Medical): No  Physical Activity: Not on file  Stress: Not on file  Social Connections: Not on file  Intimate Partner Violence: Not At Risk (02/16/2024)   Humiliation, Afraid, Rape, and Kick questionnaire    Fear of Current or Ex-Partner: No    Emotionally Abused: No    Physically Abused: No    Sexually Abused: No    Family History  Problem Relation Age of Onset   Asthma Mother    Hypertension Mother    Early death Mother        AGE 58   Colon cancer Maternal Grandmother 23       late 50s-early 60s   Liver disease Neg Hx    Esophageal cancer Neg Hx     Current Facility-Administered Medications  Medication Dose Route Frequency Provider Last Rate Last Admin   0.9 %  sodium chloride  infusion  250 mL Intravenous PRN [name], MD       acetaminophen  (TYLENOL ) tablet 1,000 mg  1,000 mg Oral Q6H [name], MD   1,000 mg at 02/19/24 0600   alum & mag hydroxide-simeth (MAALOX/MYLANTA) 200-200-20 MG/5ML suspension 30 mL  30 mL Oral Q6H PRN [name], MD       alvimopan  (ENTEREG ) capsule 12 mg  12 mg Oral BID [name], MD   12 mg at 02/19/24 9175   diphenhydrAMINE  (BENADRYL ) 12.5 MG/5ML elixir 12.5 mg  12.5 mg Oral Q6H PRN [name], MD       Or   diphenhydrAMINE  (BENADRYL ) injection 12.5 mg  12.5 mg Intravenous Q6H PRN [name], MD       enoxaparin  (LOVENOX ) injection 40 mg  40 mg Subcutaneous Q24H [name], MD   40 mg at 02/19/24 9175   feeding supplement (ENSURE SURGERY)  liquid 237 mL  237 mL Oral BID BM [name], MD   237 mL at 02/18/24 1428   gabapentin  (NEURONTIN ) capsule 200 mg  200 mg Oral QHS Emmely Bittinger, MD   200 mg at 02/18/24 2115   hydrALAZINE  (APRESOLINE ) injection 10 mg  10 mg Intravenous Q2H PRN [name], MD       HYDROmorphone  (DILAUDID ) injection 0.5-2 mg  0.5-2 mg Intravenous Q4H PRN [name], MD   1 mg at 02/17/24 9165   lidocaine -prilocaine  (EMLA ) cream 1 Application  1 Application Topical Daily PRN [name], MD       loratadine  (CLARITIN ) tablet 10 mg  10 mg Oral Daily [name], MD   10 mg at 02/19/24 9175   magic mouthwash  15 mL Oral QID PRN [name], MD       melatonin tablet 3 mg  3 mg Oral QHS PRN [name], MD   3 mg at 02/18/24 2114   menthol -cetylpyridinium (CEPACOL) lozenge 3 mg  1 lozenge Oral PRN [name], MD       methocarbamol  (ROBAXIN ) injection 1,000 mg  1,000 mg Intravenous Q6H PRN [name], MD       methocarbamol  (ROBAXIN ) tablet 1,000 mg  1,000 mg Oral Q6H PRN [name], MD   1,000 mg at 02/19/24 0600   metoprolol  tartrate (LOPRESSOR ) injection 5 mg  5 mg Intravenous Q6H PRN [name], MD       naphazoline-glycerin  (CLEAR EYES REDNESS) ophth solution 1-2 drop  1-2 drop Both Eyes QID PRN [name], MD       omeprazole  (PRILOSEC ) capsule 20 mg  20 mg Oral Daily [name], MD  20 mg at 02/19/24 9175   ondansetron  (ZOFRAN ) tablet 4 mg  4 mg Oral Q6H PRN [name], MD       Or   ondansetron  (ZOFRAN ) injection 4 mg  4 mg Intravenous Q6H PRN [name], MD   4 mg at 02/17/24 0109   oxyCODONE  (Oxy IR/ROXICODONE ) immediate release tablet 5-10 mg  5-10 mg Oral Q4H PRN Thomas, Alicia, MD   10 mg at 02/19/24 0600   phenol (CHLORASEPTIC) mouth spray 2 spray  2 spray Mouth/Throat PRN [name], MD       polycarbophil (FIBERCON) tablet 625 mg  625 mg Oral BID [name], MD   625 mg at 02/19/24 9175   prenatal multivitamin tablet 1 tablet  1 tablet Oral q AM [name], MD   1 tablet at 02/19/24 9175   prochlorperazine  (COMPAZINE ) tablet 10 mg  10 mg Oral Q6H PRN [name], MD       Or   prochlorperazine  (COMPAZINE ) injection 5-10 mg  5-10 mg Intravenous Q6H PRN [name], MD       simethicone  (MYLICON) chewable tablet 40 mg  40 mg Oral Q6H PRN [name], MD       sodium chloride  (OCEAN) 0.65 % nasal spray 1-2 spray  1-2 spray Each Nare Q6H PRN [name], MD       sodium chloride  flush (NS) 0.9 % injection 3 mL  3 mL Intravenous Q12H Ceniya Fowers, MD   3 mL at 02/19/24 0825   sodium chloride  flush (NS) 0.9 % injection 3 mL  3 mL Intravenous PRN [name], MD   3 mL at 02/18/24 0940   traMADol  (ULTRAM ) tablet 50-100 mg  50-100 mg Oral Q6H PRN [name], MD   100 mg at 02/17/24 9363     Allergies  Allergen Reactions   Fruit Extracts Itching    Fresh Fruit     Signed:   Standing KYM Sheldon, MD, FACS, MASCRS Esophageal, Gastrointestinal & Colorectal Surgery Robotic and Minimally Invasive Surgery  [facility] [address], [address] [place], [place] [address] [phone] Fax [phone] Main  CONTACT INFORMATION: Weekday (9AM-5PM): Call [facility] main office at [phone] Weeknight (5PM-9AM) or Weekend/Holiday: Check EPIC Web Links tab & use AMION (password  [email or web address]) for General Surgery [facility] coverage  Please, DO NOT use SecureChat  (it is not reliable communication to reach operating surgeons & will lead to a delay in care).   Epic staff messaging available for outptient concerns needing 1-2 business day response.      02/19/2024, 8:29 AM

## 2024-02-19 NOTE — Progress Notes (Signed)
   02/19/24 0959  TOC Brief Assessment  Insurance and Status Reviewed  Patient has primary care physician Yes  Home environment has been reviewed home  Prior level of function: independent  Prior/Current Home Services No current home services  Social Drivers of Health Review SDOH reviewed no interventions necessary  Readmission risk has been reviewed Yes  Transition of care needs no transition of care needs at this time

## 2024-03-06 ENCOUNTER — Encounter (HOSPITAL_COMMUNITY): Payer: Self-pay

## 2024-03-10 ENCOUNTER — Other Ambulatory Visit: Payer: Self-pay | Admitting: Hematology

## 2024-03-10 DIAGNOSIS — C186 Malignant neoplasm of descending colon: Secondary | ICD-10-CM

## 2024-03-11 ENCOUNTER — Encounter: Payer: Self-pay | Admitting: Hematology

## 2024-03-11 ENCOUNTER — Inpatient Hospital Stay: Attending: Nurse Practitioner

## 2024-03-11 ENCOUNTER — Inpatient Hospital Stay (HOSPITAL_BASED_OUTPATIENT_CLINIC_OR_DEPARTMENT_OTHER): Admitting: Hematology

## 2024-03-11 VITALS — BP 136/76 | HR 98 | Temp 98.4°F | Resp 16 | Ht 64.0 in | Wt 251.2 lb

## 2024-03-11 DIAGNOSIS — Z79630 Long term (current) use of alkylating agent: Secondary | ICD-10-CM | POA: Diagnosis not present

## 2024-03-11 DIAGNOSIS — Z95828 Presence of other vascular implants and grafts: Secondary | ICD-10-CM

## 2024-03-11 DIAGNOSIS — Z5112 Encounter for antineoplastic immunotherapy: Secondary | ICD-10-CM | POA: Diagnosis present

## 2024-03-11 DIAGNOSIS — C186 Malignant neoplasm of descending colon: Secondary | ICD-10-CM | POA: Insufficient documentation

## 2024-03-11 DIAGNOSIS — Z5111 Encounter for antineoplastic chemotherapy: Secondary | ICD-10-CM | POA: Diagnosis present

## 2024-03-11 DIAGNOSIS — Z5189 Encounter for other specified aftercare: Secondary | ICD-10-CM | POA: Diagnosis not present

## 2024-03-11 DIAGNOSIS — C787 Secondary malignant neoplasm of liver and intrahepatic bile duct: Secondary | ICD-10-CM | POA: Insufficient documentation

## 2024-03-11 DIAGNOSIS — Z79631 Long term (current) use of antimetabolite agent: Secondary | ICD-10-CM | POA: Diagnosis not present

## 2024-03-11 DIAGNOSIS — Z79899 Other long term (current) drug therapy: Secondary | ICD-10-CM | POA: Insufficient documentation

## 2024-03-11 DIAGNOSIS — Z79634 Long term (current) use of topoisomerase inhibitor: Secondary | ICD-10-CM | POA: Diagnosis not present

## 2024-03-11 LAB — CBC WITH DIFFERENTIAL (CANCER CENTER ONLY)
Abs Immature Granulocytes: 0.01 K/uL (ref 0.00–0.07)
Basophils Absolute: 0 K/uL (ref 0.0–0.1)
Basophils Relative: 0 %
Eosinophils Absolute: 0.1 K/uL (ref 0.0–0.5)
Eosinophils Relative: 2 %
HCT: 29.9 % — ABNORMAL LOW (ref 36.0–46.0)
Hemoglobin: 9.5 g/dL — ABNORMAL LOW (ref 12.0–15.0)
Immature Granulocytes: 0 %
Lymphocytes Relative: 32 %
Lymphs Abs: 1.2 K/uL (ref 0.7–4.0)
MCH: 25.2 pg — ABNORMAL LOW (ref 26.0–34.0)
MCHC: 31.8 g/dL (ref 30.0–36.0)
MCV: 79.3 fL — ABNORMAL LOW (ref 80.0–100.0)
Monocytes Absolute: 0.3 K/uL (ref 0.1–1.0)
Monocytes Relative: 9 %
Neutro Abs: 2.2 K/uL (ref 1.7–7.7)
Neutrophils Relative %: 57 %
Platelet Count: 238 K/uL (ref 150–400)
RBC: 3.77 MIL/uL — ABNORMAL LOW (ref 3.87–5.11)
RDW: 15.5 % (ref 11.5–15.5)
WBC Count: 3.9 K/uL — ABNORMAL LOW (ref 4.0–10.5)
nRBC: 0 % (ref 0.0–0.2)

## 2024-03-11 LAB — PREGNANCY, URINE: Preg Test, Ur: NEGATIVE

## 2024-03-11 LAB — CMP (CANCER CENTER ONLY)
ALT: 8 U/L (ref 0–44)
AST: 9 U/L — ABNORMAL LOW (ref 15–41)
Albumin: 3.8 g/dL (ref 3.5–5.0)
Alkaline Phosphatase: 71 U/L (ref 38–126)
Anion gap: 5 (ref 5–15)
BUN: 9 mg/dL (ref 6–20)
CO2: 28 mmol/L (ref 22–32)
Calcium: 9.1 mg/dL (ref 8.9–10.3)
Chloride: 107 mmol/L (ref 98–111)
Creatinine: 0.49 mg/dL (ref 0.44–1.00)
GFR, Estimated: >60 mL/min (ref >60)
Glucose, Bld: 92 mg/dL (ref 70–99)
Potassium: 4.1 mmol/L (ref 3.5–5.1)
Sodium: 140 mmol/L (ref 135–145)
Total Bilirubin: 0.4 mg/dL (ref 0.0–1.2)
Total Protein: 7.3 g/dL (ref 6.5–8.1)

## 2024-03-11 MED ORDER — SODIUM CHLORIDE 0.9% FLUSH
10.0000 mL | Freq: Once | INTRAVENOUS | Status: AC
  Administered 2024-03-11: 10 mL

## 2024-03-11 NOTE — Telephone Encounter (Signed)
 This can be on hold for now. Potassium was 4.1 today. -HB

## 2024-03-11 NOTE — Progress Notes (Signed)
 [facility]   [phone] [phone]   Clinic Follow up Note   Patient Care Team: [name], MD as PCP - General (Family Medicine) [name], [name], RN as Oncology Nurse Navigator [name], MD as Consulting Physician (Gastroenterology) [name], MD as Consulting Physician (Medical Oncology) [name], MD as Consulting Physician (Obstetrics and Gynecology) [name], MD as Consulting Physician (General Surgery) [name], MD as Consulting Physician (General Surgery)  Date of Service:  03/11/2024  CHIEF COMPLAINT: f/u of metastatic colon cancer  CURRENT THERAPY:  Recovery from surgery  Oncology History   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 -Colonoscopy showed malignant obstructive tumor in the distal descending colon, biopsy showed moderate differentiated invasive adenocarcinoma. -CT scan showed a 2.6 cm oligo liver metastasis.  Biopsy is pending -Patient was seen by colorectal surgeon Dr. Sheldon on October 10, 2023. -Would recommend neoadjuvant chemotherapy for 3 months, followed by surgery (left hemicolectomy, liver resection), or liver ablation or radiation if she respond well to chemo  -She started chemo FOLFOXIRI on 10/24/2023 -I discussed the NGS Tempus result and recommending Vectibix  to her chemo. She started from cycle 4 chemo  -restaging CT 6/24 showed slightly decreased colon and liver mets, no new lesions.  -Surgeon Dr. Dasie reviewed her case and recommend pt to be referred to [facility] for liver resection   - She underwent a hemicolectomy by Dr. Sheldon on February 16, 2024.  Surgical pathology showed T4bN0 with clear margins   Assessment & Plan Metastatic colon cancer with liver involvement Underwent laparoscopic surgery to remove the primary  tumor, a PT4B invasive moderately to poorly differentiated adenocarcinoma. Lymph nodes were negative for cancer. I spoke with pathologist about treatment effect on tumor which showed minimum response  -Off chemotherapy for almost two months, raising concern about disease progression, particularly in the liver. High risk for future recurrence due to the aggressive nature of the cancer. The goal is to reduce future cancer risk and attempt to cure, though not guaranteed. - Schedule chemotherapy to start next Monday, with two cycles planned. - Coordinate with Dr. Romero regarding the timing of liver surgery. - Monitor for disease progression with upcoming CT scan. - Discuss with her the goal of treatment to reduce future cancer risk and attempt to cure.  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 Not tender, no difficulty swallowing. No prior history of thyroid  problems. Enlargement not believed to be related to her cancer. - Order thyroid  ultrasound for further evaluation. - Schedule appointment with thyroid  specialist if needed.  Chemotherapy-induced rash Chemotherapy-induced rash has improved but is still visible on her face. Anticipates rash may worsen with the resumption of chemotherapy.   Plan -Surgical pathology reviewed with patient -I recommend restarting chemotherapy next week for additional 5 cycles, followed by liver resection.  This was discussed with her liver surgeon Dr. Romero at [facility] - Schedule chemotherapy for next Monday and two weeks after. - Schedule thyroid  ultrasound this week or next week. - Follow-up with second cycle chemo     SUMMARY OF ONCOLOGIC HISTORY: Oncology History  Adenocarcinoma of descending colon (HCC)  09/11/2023 Cancer Staging   Staging form: Colon and Rectum, AJCC 8th Edition - Clinical stage from 09/11/2023: Stage IVA (cTX, cN1, cM1a) - Signed by Lanny,  Onita,  MD on 10/12/2023   10/01/2023 Initial Diagnosis   Adenocarcinoma of descending colon (HCC)   10/24/2023 -  Chemotherapy   Patient is on Treatment Plan : COLORECTAL FOLFOXIRI q14d      Genetic Testing   Negative CancerNext +RNAinsight. The [facility] CancerNext+RNAinsight Panel includes sequencing, rearrangement analysis, and RNA analysis for the following 39 genes: APC, ATM, BAP1, BARD1, BMPR1A, BRCA1, BRCA2, BRIP1, CDH1, CDKN2A, CHEK2, FH, FLCN, MET, MLH1, MSH2, MSH6, MUTYH, NF1, NTHL1, PALB2, PMS2, PTEN, RAD51C, RAD51D, SMAD4, STK11, TP53, TSC1, TSC2, and VHL (sequencing and deletion/duplication); AXIN2, HOXB13, MBD4, MSH3, POLD1 and POLE (sequencing only); EPCAM and GREM1 (deletion/duplication only). Report date 11/04/23.       Discussed the use of AI scribe software for clinical note transcription with the patient, who gave verbal consent to proceed.  History of Present Illness Ashley [name] is a 38 year old female with metastatic colon cancer who presents for follow-up.  She recently underwent laparoscopic surgery with a small incision that initially leaked but has since healed well. She remains active with regular walking and has not experienced significant post-surgical complications.  She notes a significant increase in the size of her thyroid  observed between yesterday and today, without pain, tenderness, or difficulty swallowing. She has no prior thyroid  issues.  She has been off chemotherapy for almost two months, with the last session on January 15, 2024. Chemotherapy causes significant fatigue on the day of administration, but she recovers quickly. She experiences dry heaving and has been consuming a lot of ice cream, as she was previously unable to eat cold foods during chemotherapy.  A facial rash has improved but remains visible. Her hair is regrowing, though she anticipates thinning with further chemotherapy. She reports no significant neuropathy, numbness, or tingling.     All  other systems were reviewed with the patient and are negative.  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   Irregular periods/menstrual cycles 01/30/2012   Late prenatal care 01/30/2012   Poor social situation 01/30/2012   Previous cesarean section complicating pregnancy, antepartum condition or complication 01/27/2016   2 previous cesarean section- Desires repeat with BTL   Single liveborn, born in hospital, delivered by cesarean delivery 08/13/2016   Status post repeat low transverse cesarean section 08/14/2016   Status post tubal ligation at time of delivery, current hosp 08/14/2016    SURGICAL HISTORY: Past Surgical History:  Procedure Laterality Date   CESAREAN SECTION  2009   CESAREAN SECTION  01/30/2012   Procedure: CESAREAN SECTION;  Surgeon: Ovid DELENA All, MD;  Location: [facility];  Service: Gynecology;  Laterality: N/A;  Repeat C/S   CESAREAN SECTION WITH BILATERAL TUBAL LIGATION Bilateral 08/13/2016   Procedure: CESAREAN SECTION WITH BILATERAL TUBAL LIGATION;  Surgeon: Burnard VEAR Pate, MD;  Location: [facility];  Service: Obstetrics;  Laterality: Bilateral;   CHOLECYSTECTOMY N/A 10/16/2013   Procedure: LAPAROSCOPIC CHOLECYSTECTOMY WITH INTRAOPERATIVE CHOLANGIOGRAM;  Surgeon: Donnice POUR. Belinda, MD;  Location: [facility];  Service: General;  Laterality: N/A;   COLONOSCOPY WITH PROPOFOL  N/A 09/11/2023   Procedure: COLONOSCOPY WITH PROPOFOL ;  Surgeon: [name], MD;  Location: [facility];  Service: Gastroenterology;  Laterality: N/A;   DILATION AND EVACUATION N/A 07/28/2015   Procedure: SUCTION, DILATATION AND EVACUATION;  Surgeon: Carlin DELENA Centers,  MD;  Location: [facility];  Service: Gynecology;  Laterality: N/A;   ESOPHAGOGASTRODUODENOSCOPY (EGD) WITH PROPOFOL  N/A 09/11/2023   Procedure:  ESOPHAGOGASTRODUODENOSCOPY (EGD) WITH PROPOFOL ;  Surgeon: [name], MD;  Location: [facility];  Service: Gastroenterology;  Laterality: N/A;   IR IMAGING GUIDED PORT INSERTION  10/20/2023   IR US  LIVER BIOPSY  10/20/2023   SUBMUCOSAL INJECTION  09/11/2023   Procedure: INJECTION, SUBMUCOSAL;  Surgeon: [name], MD;  Location: [facility];  Service: Gastroenterology;;   TONSILLECTOMY  AGE 62   TONSILLECTOMY AND ADENOIDECTOMY  AGEB 18    I have reviewed the social history and family history with the patient and they are unchanged from previous note.  ALLERGIES:  is allergic to fruit extracts.  MEDICATIONS:  Current Outpatient Medications  Medication Sig Dispense Refill   doxycycline  (VIBRA -TABS) 100 MG tablet Take 1 tablet (100 mg total) by mouth 2 (two) times daily. 60 tablet 1   Hydrocortisone Acetate (MEDI-CORTISONE EX) Apply 1 application  topically daily. Applied to face once daily     lidocaine -prilocaine  (EMLA ) cream Apply to affected area once (Patient taking differently: Apply 1 Application topically daily as needed (prior to port being accessed.).) 30 g 3   loratadine  (CLARITIN ) 10 MG tablet Take 1 tablet (10 mg total) by mouth daily. 90 tablet 0   magnesium  oxide (MAG-OX) 400 (240 Mg) MG tablet Take 1 tablet (400 mg total) by mouth 2 (two) times daily. 60 tablet 1   methocarbamol  (ROBAXIN ) 500 MG tablet Take 1 tablet (500 mg total) by mouth every 8 (eight) hours as needed for muscle spasms. 20 tablet 1   NON FORMULARY Take 1 Dose by mouth daily. [place]     omeprazole  (PRILOSEC  OTC) 20 MG tablet Take 1 tablet (20 mg total) by mouth daily. 28 tablet 1   OVER THE COUNTER MEDICATION Take 2 each by mouth in the morning. Soursop Garviola Gummies     oxyCODONE -acetaminophen  (PERCOCET) 5-325 MG tablet Take 1 tablet by mouth every 6 (six) hours as needed for severe pain (pain score 7-10) or moderate pain (pain score 4-6). Can increase to 2 pills at a time as needed 30 tablet 0    potassium chloride  SA (KLOR-CON  M) 20 MEQ tablet Take 1 tablet (20 mEq total) by mouth daily. 30 tablet 0   Prenatal Vit-Fe Fumarate-FA (MULTIVITAMIN-PRENATAL) 27-0.8 MG TABS tablet Take 1 tablet by mouth in the morning.     No current facility-administered medications for this visit.    PHYSICAL EXAMINATION: ECOG PERFORMANCE STATUS: 1 - Symptomatic but completely ambulatory  Vitals:   03/11/24 0919  BP: 136/76  Pulse: 98  Resp: 16  Temp: 98.4 F (36.9 C)  SpO2: 100%   Wt Readings from Last 3 Encounters:  03/11/24 251 lb 3.2 oz (113.9 kg)  02/19/24 268 lb 15.4 oz (122 kg)  02/09/24 261 lb (118.4 kg)     GENERAL:alert, no distress and comfortable SKIN: skin color, texture, turgor are normal, healing diffuse skin rash on face EYES: normal, Conjunctiva are pink and non-injected, sclera clear NECK: supple, enlarged thyroid  gland, with a medium in size palpable mass on the lateral to front of right neck LYMPH:  no palpable lymphadenopathy in the cervical, axillary  LUNGS: clear to auscultation and percussion with normal breathing effort HEART: regular rate & rhythm and no murmurs and no lower extremity edema ABDOMEN:abdomen soft, non-tender and normal bowel sounds Musculoskeletal:no cyanosis of digits and no clubbing  NEURO: alert & oriented x 3 with fluent  speech, no focal motor/sensory deficits  Physical Exam   LABORATORY DATA:  I have reviewed the data as listed    Latest Ref Rng & Units 03/11/2024    9:10 AM 02/17/2024    4:48 AM 02/09/2024   10:48 AM  CBC  WBC 4.0 - 10.5 K/uL 3.9  9.8  5.4   Hemoglobin 12.0 - 15.0 g/dL 9.5  8.4  89.4   Hematocrit 36.0 - 46.0 % 29.9  27.2  34.6   Platelets 150 - 400 K/uL 238  212  146         Latest Ref Rng & Units 03/11/2024    9:10 AM 02/17/2024    4:48 AM 02/09/2024   10:48 AM  CMP  Glucose 70 - 99 mg/dL 92  864  899   BUN 6 - 20 mg/dL 9  7  9    Creatinine 0.44 - 1.00 mg/dL 9.50  9.41  9.45   Sodium 135 - 145 mmol/L 140  135  141    Potassium 3.5 - 5.1 mmol/L 4.1  4.2  3.9   Chloride 98 - 111 mmol/L 107  102  105   CO2 22 - 32 mmol/L 28  25  25    Calcium  8.9 - 10.3 mg/dL 9.1  8.9  9.1   Total Protein 6.5 - 8.1 g/dL 7.3     Total Bilirubin 0.0 - 1.2 mg/dL 0.4     Alkaline Phos 38 - 126 U/L 71     AST 15 - 41 U/L 9     ALT 0 - 44 U/L 8         RADIOGRAPHIC STUDIES: I have personally reviewed the radiological images as listed and agreed with the findings in the report. No results found.    Orders Placed This Encounter  Procedures   US  Soft Tissue Head/Neck    Standing Status:   Future    Expected Date:   03/14/2024    Expiration Date:   03/11/2025    Reason for Exam (SYMPTOM  OR DIAGNOSIS REQUIRED):   right neck mass, thyroid  vs enlarge nodes?    Preferred imaging location?:   [facility]   CBC with Differential (Cancer Center Only)    Standing Status:   Future    Expected Date:   03/18/2024    Expiration Date:   03/18/2025   CMP (Cancer Center only)    Standing Status:   Future    Expected Date:   03/18/2024    Expiration Date:   03/18/2025   CBC with Differential (Cancer Center Only)    Standing Status:   Future    Expected Date:   04/01/2024    Expiration Date:   04/01/2025   CMP (Cancer Center only)    Standing Status:   Future    Expected Date:   04/01/2024    Expiration Date:   04/01/2025   All questions were answered. The patient knows to call the clinic with any problems, questions or concerns. No barriers to learning was detected. The total time spent in the appointment was 40 minutes, including review of chart and various tests results, discussions about plan of care and coordination of care plan     Onita Mattock, MD 03/11/2024

## 2024-03-11 NOTE — Assessment & Plan Note (Signed)
-  Stage IV with oligometastasis, MSS, HER2 (+), UGT1A1 (+), KRAS/NRAS/BRAF wild type -Presented with abdominal pain, change of bowel habits, and a 50 pound weight loss. -Colonoscopy showed malignant obstructive tumor in the distal descending colon, biopsy showed moderate differentiated invasive adenocarcinoma. -CT scan showed a 2.6 cm oligo liver metastasis.  Biopsy is pending -Patient was seen by colorectal surgeon Dr. Sheldon on October 10, 2023. -Would recommend neoadjuvant chemotherapy for 3 months, followed by surgery (left hemicolectomy, liver resection), or liver ablation or radiation if she respond well to chemo  -She started chemo FOLFOXIRI on 10/24/2023 -I discussed the NGS Tempus result and recommending Vectibix  to her chemo. She started from cycle 4 chemo  -restaging CT 6/24 showed slightly decreased colon and liver mets, no new lesions.  -Surgeon Dr. Dasie reviewed her case and recommend pt to be referred to [facility] for liver resection   - She underwent a hemicolectomy by Dr. Sheldon on February 16, 2024.  Surgical pathology showed T4bN0 with clear margins

## 2024-03-12 ENCOUNTER — Encounter: Payer: Self-pay | Admitting: Hematology

## 2024-03-12 LAB — SURGICAL PATHOLOGY

## 2024-03-15 DIAGNOSIS — C19 Malignant neoplasm of rectosigmoid junction: Secondary | ICD-10-CM | POA: Diagnosis not present

## 2024-03-15 DIAGNOSIS — C787 Secondary malignant neoplasm of liver and intrahepatic bile duct: Secondary | ICD-10-CM | POA: Diagnosis not present

## 2024-03-15 DIAGNOSIS — Z9049 Acquired absence of other specified parts of digestive tract: Secondary | ICD-10-CM | POA: Diagnosis not present

## 2024-03-15 DIAGNOSIS — C189 Malignant neoplasm of colon, unspecified: Secondary | ICD-10-CM | POA: Diagnosis not present

## 2024-03-15 DIAGNOSIS — C186 Malignant neoplasm of descending colon: Secondary | ICD-10-CM | POA: Diagnosis not present

## 2024-03-15 DIAGNOSIS — N838 Other noninflammatory disorders of ovary, fallopian tube and broad ligament: Secondary | ICD-10-CM | POA: Diagnosis not present

## 2024-03-16 NOTE — Progress Notes (Signed)
 Did well with left colectomy.    On restaging imaging today, still with isolated liver lesion.    Plan will be to complete three additional month of systemic therapy, restage, and then move ahead with PVE and right hepatectomy as long as no progression.    Toribio Hammersmith MD

## 2024-03-18 ENCOUNTER — Encounter: Payer: Self-pay | Admitting: Hematology

## 2024-03-18 ENCOUNTER — Other Ambulatory Visit: Payer: Self-pay | Admitting: Nurse Practitioner

## 2024-03-18 ENCOUNTER — Inpatient Hospital Stay

## 2024-03-18 VITALS — BP 132/73 | HR 75 | Temp 97.9°F | Resp 16

## 2024-03-18 DIAGNOSIS — C186 Malignant neoplasm of descending colon: Secondary | ICD-10-CM | POA: Diagnosis not present

## 2024-03-18 DIAGNOSIS — Z5111 Encounter for antineoplastic chemotherapy: Secondary | ICD-10-CM | POA: Diagnosis not present

## 2024-03-18 LAB — CBC WITH DIFFERENTIAL (CANCER CENTER ONLY)
Abs Immature Granulocytes: 0 K/uL (ref 0.00–0.07)
Basophils Absolute: 0 K/uL (ref 0.0–0.1)
Basophils Relative: 0 %
Eosinophils Absolute: 0.1 K/uL (ref 0.0–0.5)
Eosinophils Relative: 2 %
HCT: 29.8 % — ABNORMAL LOW (ref 36.0–46.0)
Hemoglobin: 9.7 g/dL — ABNORMAL LOW (ref 12.0–15.0)
Immature Granulocytes: 0 %
Lymphocytes Relative: 35 %
Lymphs Abs: 1.1 K/uL (ref 0.7–4.0)
MCH: 25.4 pg — ABNORMAL LOW (ref 26.0–34.0)
MCHC: 32.6 g/dL (ref 30.0–36.0)
MCV: 78 fL — ABNORMAL LOW (ref 80.0–100.0)
Monocytes Absolute: 0.3 K/uL (ref 0.1–1.0)
Monocytes Relative: 8 %
Neutro Abs: 1.7 K/uL (ref 1.7–7.7)
Neutrophils Relative %: 55 %
Platelet Count: 175 K/uL (ref 150–400)
RBC: 3.82 MIL/uL — ABNORMAL LOW (ref 3.87–5.11)
RDW: 14.9 % (ref 11.5–15.5)
WBC Count: 3.1 K/uL — ABNORMAL LOW (ref 4.0–10.5)
nRBC: 0 % (ref 0.0–0.2)

## 2024-03-18 LAB — MAGNESIUM: Magnesium: 1.8 mg/dL (ref 1.7–2.4)

## 2024-03-18 MED ORDER — SODIUM CHLORIDE 0.9 % IV SOLN
6.0000 mg/kg | Freq: Once | INTRAVENOUS | Status: AC
  Administered 2024-03-18: 700 mg via INTRAVENOUS
  Filled 2024-03-18: qty 15

## 2024-03-18 MED ORDER — ATROPINE SULFATE 1 MG/ML IV SOLN
0.5000 mg | Freq: Once | INTRAVENOUS | Status: AC | PRN
  Administered 2024-03-18: 0.5 mg via INTRAVENOUS
  Filled 2024-03-18: qty 1

## 2024-03-18 MED ORDER — DEXTROSE 5 % IV SOLN: INTRAVENOUS | Status: DC

## 2024-03-18 MED ORDER — SODIUM CHLORIDE 0.9 % IV SOLN: Freq: Once | INTRAVENOUS | Status: AC

## 2024-03-18 MED ORDER — PALONOSETRON HCL INJECTION 0.25 MG/5ML
0.2500 mg | Freq: Once | INTRAVENOUS | Status: AC
  Administered 2024-03-18: 0.25 mg via INTRAVENOUS
  Filled 2024-03-18: qty 5

## 2024-03-18 MED ORDER — APREPITANT 130 MG/18ML IV EMUL
130.0000 mg | Freq: Once | INTRAVENOUS | Status: AC
  Administered 2024-03-18: 130 mg via INTRAVENOUS
  Filled 2024-03-18: qty 18

## 2024-03-18 MED ORDER — DEXAMETHASONE SODIUM PHOSPHATE 10 MG/ML IJ SOLN
10.0000 mg | Freq: Once | INTRAMUSCULAR | Status: AC
  Administered 2024-03-18: 10 mg via INTRAVENOUS
  Filled 2024-03-18: qty 1

## 2024-03-18 MED ORDER — LEUCOVORIN CALCIUM INJECTION 350 MG
400.0000 mg/m2 | Freq: Once | INTRAVENOUS | Status: AC
  Administered 2024-03-18: 900 mg via INTRAVENOUS
  Filled 2024-03-18: qty 45

## 2024-03-18 MED ORDER — PROCHLORPERAZINE MALEATE 10 MG PO TABS
10.0000 mg | ORAL_TABLET | Freq: Once | ORAL | Status: AC
  Administered 2024-03-18: 10 mg via ORAL
  Filled 2024-03-18: qty 1

## 2024-03-18 MED ORDER — PROMETHAZINE HCL 25 MG PO TABS
25.0000 mg | ORAL_TABLET | Freq: Once | ORAL | Status: AC
  Administered 2024-03-18: 25 mg via ORAL
  Filled 2024-03-18: qty 1

## 2024-03-18 MED ORDER — POTASSIUM CHLORIDE CRYS ER 20 MEQ PO TBCR: 20.0000 meq | EXTENDED_RELEASE_TABLET | Freq: Every day | ORAL | 0 refills | Status: DC

## 2024-03-18 MED ORDER — MAGNESIUM SULFATE 2 GM/50ML IV SOLN
2.0000 g | Freq: Once | INTRAVENOUS | Status: AC
  Administered 2024-03-18: 2 g via INTRAVENOUS
  Filled 2024-03-18: qty 50

## 2024-03-18 MED ORDER — SODIUM CHLORIDE 0.9 % IV SOLN
3200.0000 mg/m2 | INTRAVENOUS | Status: DC
  Administered 2024-03-18: 7000 mg via INTRAVENOUS
  Filled 2024-03-18: qty 140

## 2024-03-18 MED ORDER — OXALIPLATIN CHEMO INJECTION 100 MG/20ML
85.0000 mg/m2 | Freq: Once | INTRAVENOUS | Status: AC
  Administered 2024-03-18: 200 mg via INTRAVENOUS
  Filled 2024-03-18: qty 40

## 2024-03-18 MED ORDER — SODIUM CHLORIDE 0.9 % IV SOLN
150.0000 mg/m2 | Freq: Once | INTRAVENOUS | Status: AC
  Administered 2024-03-18: 340 mg via INTRAVENOUS
  Filled 2024-03-18: qty 15

## 2024-03-18 MED ORDER — MAGNESIUM SULFATE 2 GM/50ML IV SOLN: 2.0000 g | Freq: Once | INTRAVENOUS | Status: DC

## 2024-03-18 NOTE — Patient Instructions (Signed)
 [facility] - A DEPT OF [facility]  Discharge Instructions: Thank you for choosing [facility] to provide your oncology and hematology care.   If you have a lab appointment with the Cancer Center, please go directly to the Cancer Center and check in at the registration area.   Wear comfortable clothing and clothing appropriate for easy access to any Portacath or PICC line.   We strive to give you quality time with your provider. You may need to reschedule your appointment if you arrive late (15 or more minutes).  Arriving late affects you and other patients whose appointments are after yours.  Also, if you miss three or more appointments without notifying the office, you may be dismissed from the clinic at the provider's discretion.      For prescription refill requests, have your pharmacy contact our office and allow 72 hours for refills to be completed.    Today you received the following chemotherapy and/or immunotherapy agents leucovorin , fluorourcil, irinotecan , oxaliplatin , vectibix       To help prevent nausea and vomiting after your treatment, we encourage you to take your nausea medication as directed.  BELOW ARE SYMPTOMS THAT SHOULD BE REPORTED IMMEDIATELY: *FEVER GREATER THAN 100.4 F (38 C) OR HIGHER *CHILLS OR SWEATING *NAUSEA AND VOMITING THAT IS NOT CONTROLLED WITH YOUR NAUSEA MEDICATION *UNUSUAL SHORTNESS OF BREATH *UNUSUAL BRUISING OR BLEEDING *URINARY PROBLEMS (pain or burning when urinating, or frequent urination) *BOWEL PROBLEMS (unusual diarrhea, constipation, pain near the anus) TENDERNESS IN MOUTH AND THROAT WITH OR WITHOUT PRESENCE OF ULCERS (sore throat, sores in mouth, or a toothache) UNUSUAL RASH, SWELLING OR PAIN  UNUSUAL VAGINAL DISCHARGE OR ITCHING   Items with * indicate a potential emergency and should be followed up as soon as possible or go to the Emergency Department if any problems should occur.  Please  show the CHEMOTHERAPY ALERT CARD or IMMUNOTHERAPY ALERT CARD at check-in to the Emergency Department and triage nurse.  Should you have questions after your visit or need to cancel or reschedule your appointment, please contact [facility] - A DEPT OF [name]  Dept: [phone]  and follow the prompts.  Office hours are 8:00 a.m. to 4:30 p.m. Monday - Friday. Please note that voicemails left after 4:00 p.m. may not be returned until the following business day.  We are closed weekends and major holidays. You have access to a nurse at all times for urgent questions. Please call the main number to the clinic Dept: [phone] and follow the prompts.   For any non-urgent questions, you may also contact your provider using MyChart. We now offer e-Visits for anyone 90 and older to request care online for non-urgent symptoms. For details visit [email or web address].   Also download the MyChart app! Go to the app store, search MyChart, open the app, select [place], and log in with your MyChart username and password.

## 2024-03-18 NOTE — Progress Notes (Signed)
 Per Dr. Lonn, ok to speed up 5FU pump to 46 hours to accommodate patient's schedule on Wednesday

## 2024-03-20 ENCOUNTER — Inpatient Hospital Stay

## 2024-03-20 VITALS — BP 130/77 | HR 73 | Temp 97.6°F | Resp 18

## 2024-03-20 DIAGNOSIS — C186 Malignant neoplasm of descending colon: Secondary | ICD-10-CM

## 2024-03-20 DIAGNOSIS — Z5111 Encounter for antineoplastic chemotherapy: Secondary | ICD-10-CM | POA: Diagnosis not present

## 2024-03-20 MED ORDER — PEGFILGRASTIM-CBQV 6 MG/0.6ML ~~LOC~~ SOSY
6.0000 mg | PREFILLED_SYRINGE | Freq: Once | SUBCUTANEOUS | Status: AC
  Administered 2024-03-20: 6 mg via SUBCUTANEOUS
  Filled 2024-03-20: qty 0.6

## 2024-03-22 ENCOUNTER — Ambulatory Visit (HOSPITAL_COMMUNITY)
Admission: RE | Admit: 2024-03-22 | Discharge: 2024-03-22 | Disposition: A | Discharge status: Home / Self Care | Source: Ambulatory Visit | Attending: Hematology | Admitting: Hematology

## 2024-03-22 DIAGNOSIS — C186 Malignant neoplasm of descending colon: Secondary | ICD-10-CM | POA: Insufficient documentation

## 2024-03-22 DIAGNOSIS — E049 Nontoxic goiter, unspecified: Secondary | ICD-10-CM | POA: Diagnosis not present

## 2024-03-31 NOTE — Assessment & Plan Note (Signed)
-  Stage IV with oligometastasis, MSS, HER2 (+), UGT1A1 (+), KRAS/NRAS/BRAF wild type -Presented with abdominal pain, change of bowel habits, and a 50 pound weight loss. -Colonoscopy showed malignant obstructive tumor in the distal descending colon, biopsy showed moderate differentiated invasive adenocarcinoma. -CT scan showed a 2.6 cm oligo liver metastasis.  Biopsy is pending -Patient was seen by colorectal surgeon Dr. Sheldon on October 10, 2023. -Would recommend neoadjuvant chemotherapy for 3 months, followed by surgery (left hemicolectomy, liver resection), or liver ablation or radiation if she respond well to chemo  -She started chemo FOLFOXIRI on 10/24/2023 -I discussed the NGS Tempus result and recommending Vectibix  to her chemo. She started from cycle 4 chemo  -restaging CT 6/24 showed slightly decreased colon and liver mets, no new lesions.  -Surgeon Dr. Dasie reviewed her case and recommend pt to be referred to [facility] for liver resection   - She underwent a hemicolectomy by Dr. Sheldon on February 16, 2024.  Surgical pathology showed T4bN0 with clear margins  -will continue chemo for 3 more months before liver resection

## 2024-04-01 ENCOUNTER — Inpatient Hospital Stay: Admitting: Hematology

## 2024-04-01 ENCOUNTER — Other Ambulatory Visit: Payer: Self-pay

## 2024-04-01 ENCOUNTER — Inpatient Hospital Stay

## 2024-04-01 VITALS — BP 133/93 | HR 79 | Temp 98.7°F | Resp 18 | Ht 64.0 in | Wt 247.0 lb

## 2024-04-01 DIAGNOSIS — Z5111 Encounter for antineoplastic chemotherapy: Secondary | ICD-10-CM | POA: Diagnosis not present

## 2024-04-01 DIAGNOSIS — C186 Malignant neoplasm of descending colon: Secondary | ICD-10-CM

## 2024-04-01 LAB — CBC WITH DIFFERENTIAL (CANCER CENTER ONLY)
Abs Immature Granulocytes: 0.29 K/uL — ABNORMAL HIGH (ref 0.00–0.07)
Basophils Absolute: 0 K/uL (ref 0.0–0.1)
Basophils Relative: 1 %
Eosinophils Absolute: 0.1 K/uL (ref 0.0–0.5)
Eosinophils Relative: 2 %
HCT: 33.2 % — ABNORMAL LOW (ref 36.0–46.0)
Hemoglobin: 10.7 g/dL — ABNORMAL LOW (ref 12.0–15.0)
Immature Granulocytes: 5 %
Lymphocytes Relative: 24 %
Lymphs Abs: 1.4 K/uL (ref 0.7–4.0)
MCH: 25.1 pg — ABNORMAL LOW (ref 26.0–34.0)
MCHC: 32.2 g/dL (ref 30.0–36.0)
MCV: 77.8 fL — ABNORMAL LOW (ref 80.0–100.0)
Monocytes Absolute: 0.4 K/uL (ref 0.1–1.0)
Monocytes Relative: 8 %
Neutro Abs: 3.5 K/uL (ref 1.7–7.7)
Neutrophils Relative %: 60 %
Platelet Count: 130 K/uL — ABNORMAL LOW (ref 150–400)
RBC: 4.27 MIL/uL (ref 3.87–5.11)
RDW: 15.2 % (ref 11.5–15.5)
WBC Count: 5.8 K/uL (ref 4.0–10.5)
nRBC: 0 % (ref 0.0–0.2)

## 2024-04-01 LAB — CMP (CANCER CENTER ONLY)
ALT: 14 U/L (ref 0–44)
AST: 16 U/L (ref 15–41)
Albumin: 3.7 g/dL (ref 3.5–5.0)
Alkaline Phosphatase: 109 U/L (ref 38–126)
Anion gap: 4 — ABNORMAL LOW (ref 5–15)
BUN: 7 mg/dL (ref 6–20)
CO2: 31 mmol/L (ref 22–32)
Calcium: 9 mg/dL (ref 8.9–10.3)
Chloride: 105 mmol/L (ref 98–111)
Creatinine: 0.56 mg/dL (ref 0.44–1.00)
GFR, Estimated: >60 mL/min (ref >60)
Glucose, Bld: 104 mg/dL — ABNORMAL HIGH (ref 70–99)
Potassium: 3.4 mmol/L — ABNORMAL LOW (ref 3.5–5.1)
Sodium: 140 mmol/L (ref 135–145)
Total Bilirubin: 0.3 mg/dL (ref 0.0–1.2)
Total Protein: 7 g/dL (ref 6.5–8.1)

## 2024-04-01 LAB — MAGNESIUM: Magnesium: 1.9 mg/dL (ref 1.7–2.4)

## 2024-04-01 LAB — PREGNANCY, URINE: Preg Test, Ur: NEGATIVE

## 2024-04-01 MED ORDER — SODIUM CHLORIDE 0.9 % IV SOLN
150.0000 mg/m2 | Freq: Once | INTRAVENOUS | Status: AC
  Administered 2024-04-01: 340 mg via INTRAVENOUS
  Filled 2024-04-01: qty 15

## 2024-04-01 MED ORDER — ATROPINE SULFATE 1 MG/ML IV SOLN
0.5000 mg | Freq: Once | INTRAVENOUS | Status: AC | PRN
  Administered 2024-04-01: 0.5 mg via INTRAVENOUS
  Filled 2024-04-01: qty 1

## 2024-04-01 MED ORDER — DEXTROSE 5 % IV SOLN: INTRAVENOUS | Status: DC

## 2024-04-01 MED ORDER — OMEPRAZOLE MAGNESIUM 20 MG PO TBEC: 20.0000 mg | DELAYED_RELEASE_TABLET | Freq: Every day | ORAL | 1 refills | Status: AC

## 2024-04-01 MED ORDER — SODIUM CHLORIDE 0.9 % IV SOLN
3200.0000 mg/m2 | INTRAVENOUS | Status: DC
  Administered 2024-04-01: 7000 mg via INTRAVENOUS
  Filled 2024-04-01: qty 140

## 2024-04-01 MED ORDER — LEUCOVORIN CALCIUM INJECTION 350 MG
400.0000 mg/m2 | Freq: Once | INTRAVENOUS | Status: AC
  Administered 2024-04-01: 900 mg via INTRAVENOUS
  Filled 2024-04-01: qty 45

## 2024-04-01 MED ORDER — OXALIPLATIN CHEMO INJECTION 100 MG/20ML
85.0000 mg/m2 | Freq: Once | INTRAVENOUS | Status: AC
  Administered 2024-04-01: 200 mg via INTRAVENOUS
  Filled 2024-04-01: qty 40

## 2024-04-01 MED ORDER — PALONOSETRON HCL INJECTION 0.25 MG/5ML
0.2500 mg | Freq: Once | INTRAVENOUS | Status: AC
  Administered 2024-04-01: 0.25 mg via INTRAVENOUS
  Filled 2024-04-01: qty 5

## 2024-04-01 MED ORDER — SODIUM CHLORIDE 0.9 % IV SOLN: Freq: Once | INTRAVENOUS | Status: AC

## 2024-04-01 MED ORDER — SODIUM CHLORIDE 0.9 % IV SOLN
6.0000 mg/kg | Freq: Once | INTRAVENOUS | Status: AC
  Administered 2024-04-01: 700 mg via INTRAVENOUS
  Filled 2024-04-01: qty 15

## 2024-04-01 MED ORDER — MAGNESIUM SULFATE 2 GM/50ML IV SOLN
2.0000 g | Freq: Once | INTRAVENOUS | Status: AC
  Administered 2024-04-01: 2 g via INTRAVENOUS
  Filled 2024-04-01: qty 50

## 2024-04-01 MED ORDER — APREPITANT 130 MG/18ML IV EMUL
130.0000 mg | Freq: Once | INTRAVENOUS | Status: AC
  Administered 2024-04-01: 130 mg via INTRAVENOUS
  Filled 2024-04-01: qty 18

## 2024-04-01 MED ORDER — DEXAMETHASONE SODIUM PHOSPHATE 10 MG/ML IJ SOLN
10.0000 mg | Freq: Once | INTRAMUSCULAR | Status: AC
  Administered 2024-04-01: 10 mg via INTRAVENOUS
  Filled 2024-04-01: qty 1

## 2024-04-01 MED ORDER — MAGNESIUM OXIDE -MG SUPPLEMENT 400 (240 MG) MG PO TABS: 400.0000 mg | ORAL_TABLET | Freq: Two times a day (BID) | ORAL | 1 refills | Status: DC

## 2024-04-01 NOTE — Progress Notes (Signed)
 [facility]   [phone]) [phone] [phone]   Clinic Follow up Note   Patient Care Team: [name], MD as PCP - General (Family Medicine) [name], [name], RN as Oncology Nurse Navigator [name], MD as Consulting Physician (Gastroenterology) [name], MD as Consulting Physician (Medical Oncology) [name], MD as Consulting Physician (Obstetrics and Gynecology) [name], MD as Consulting Physician (General Surgery) [name], MD as Consulting Physician (General Surgery)  Date of Service:  04/01/2024  CHIEF COMPLAINT: f/u of metastatic colon cancer to liver  CURRENT THERAPY:  FOLFOXIRI every 2 weeks  Oncology History   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 -Colonoscopy showed malignant obstructive tumor in the distal descending colon, biopsy showed moderate differentiated invasive adenocarcinoma. -CT scan showed a 2.6 cm oligo liver metastasis.  Biopsy is pending -Patient was seen by colorectal surgeon Dr. Sheldon on October 10, 2023. -Would recommend neoadjuvant chemotherapy for 3 months, followed by surgery (left hemicolectomy, liver resection), or liver ablation or radiation if she respond well to chemo  -She started chemo FOLFOXIRI on 10/24/2023 -I discussed the NGS Tempus result and recommending Vectibix  to her chemo. She started from cycle 4 chemo  -restaging CT 6/24 showed slightly decreased colon and liver mets, no new lesions.  -Surgeon Dr. Dasie reviewed her case and recommend pt to be referred to [facility] for liver resection   - She underwent a hemicolectomy by Dr. Sheldon on February 16, 2024.  Surgical pathology showed [id number] with clear margins  -will continue chemo for 3 more months before liver resection   Assessment & Plan Metastatic colon cancer with  liver metastasis Currently undergoing chemotherapy, with the second round completed. No significant side effects reported, except for mild cold-induced tingling in fingers and toes. - Continue chemotherapy as scheduled - Consider using ice on hands and feet during chemotherapy to reduce neuropathy risk - Plan for liver surgery after chemotherapy, preferably after Christmas  Thrombocytopenia Platelet count is slightly low, consistent with previous results. No new symptoms reported. - Monitor platelet count  Hypokalemia Low potassium levels, not currently taking potassium supplements but plans to resume. - Resume potassium supplementation - Monitor potassium levels  Magnesium  deficiency Currently taking magnesium  supplements but running low. - Refill magnesium  prescription  Gastroesophageal reflux disease (GERD) Experiencing symptoms when omeprazole  is missed. Omeprazole  is effective in managing symptoms. - Refill omeprazole  prescription  Plan - Labs reviewed, adequate for treatment, will proceed cycle 9 FOLFOXIRI today and continue every 2 weeks for a total of 13 cycles  -Due to the high risk of neuropathy, I encouraged her to use ice packs for her hands and feet during her chemoinfusion -f/u in 2 weeks  - She has been on oxaliplatin  for more than 3 months now, I will add additional premeds Benadryl  and Pepcid  to prevent allergy reactions from next cycle.   SUMMARY OF ONCOLOGIC HISTORY: Oncology History  Adenocarcinoma of descending colon (HCC)  09/11/2023 Cancer Staging   Staging form: Colon and Rectum, AJCC 8th Edition - Clinical stage from 09/11/2023: Stage IVA (cTX, cN1, cM1a) - Signed by [name], MD on 10/12/2023   10/01/2023 Initial Diagnosis   Adenocarcinoma of descending colon (HCC)   10/24/2023 -  Chemotherapy   Patient is on Treatment Plan : COLORECTAL FOLFOXIRI q14d      Genetic Testing   Negative  CancerNext +RNAinsight. The [employer] CancerNext+RNAinsight Panel  includes sequencing, rearrangement analysis, and RNA analysis for the following 39 genes: APC, ATM, BAP1, BARD1, BMPR1A, BRCA1, BRCA2, BRIP1, CDH1, CDKN2A, CHEK2, FH, FLCN, MET, MLH1, MSH2, MSH6, MUTYH, NF1, NTHL1, PALB2, PMS2, PTEN, RAD51C, RAD51D, SMAD4, STK11, TP53, TSC1, TSC2, and VHL (sequencing and deletion/duplication); AXIN2, HOXB13, MBD4, MSH3, POLD1 and POLE (sequencing only); EPCAM and GREM1 (deletion/duplication only). Report date 11/04/23.       Discussed the use of AI scribe software for clinical note transcription with the patient, who gave verbal consent to proceed.  History of Present Illness Ashley [name] is a 38 year old female with metastatic colon cancer to the liver who presents for follow-up.  She is undergoing chemotherapy for metastatic colon cancer to the liver, currently in her second round. Her last session was on March 18, 2024, and today is cycle nine out of 13, with the final session scheduled for May 27, 2024. She experiences slight tingling in her fingers and toes due to cold temperatures but no significant side effects such as numbness, tingling, or diarrhea. Bowel movements are regular with occasional constipation, managed through dietary adjustments.  She is concerned about insufficient fiber intake and inquires about taking fiber gummies. Her current medications include omeprazole  for acid reflux, which is effective. She is awaiting refills for potassium and magnesium  supplements, noting that previously low potassium levels contributed to fatigue. She plans to resume her potassium supplements soon. She also takes a multivitamin and probiotics regularly.     All other systems were reviewed with the patient and are negative.  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   Irregular  periods/menstrual cycles 01/30/2012   Late prenatal care 01/30/2012   Poor social situation 01/30/2012   Previous cesarean section complicating pregnancy, antepartum condition or complication 01/27/2016   2 previous cesarean section- Desires repeat with BTL   Single liveborn, born in hospital, delivered by cesarean delivery 08/13/2016   Status post repeat low transverse cesarean section 08/14/2016   Status post tubal ligation at time of delivery, current hosp 08/14/2016    SURGICAL HISTORY: Past Surgical History:  Procedure Laterality Date   CESAREAN SECTION  2009   CESAREAN SECTION  01/30/2012   Procedure: CESAREAN SECTION;  Surgeon: Ovid DELENA All, MD;  Location: [facility];  Service: Gynecology;  Laterality: N/A;  Repeat C/S   CESAREAN SECTION WITH BILATERAL TUBAL LIGATION Bilateral 08/13/2016   Procedure: CESAREAN SECTION WITH BILATERAL TUBAL LIGATION;  Surgeon: Burnard VEAR Pate, MD;  Location: [facility];  Service: Obstetrics;  Laterality: Bilateral;   CHOLECYSTECTOMY N/A 10/16/2013   Procedure: LAPAROSCOPIC CHOLECYSTECTOMY WITH INTRAOPERATIVE CHOLANGIOGRAM;  Surgeon: Donnice POUR. Belinda, MD;  Location: [facility] OR;  Service: General;  Laterality: N/A;   COLONOSCOPY WITH PROPOFOL  N/A 09/11/2023   Procedure: COLONOSCOPY WITH PROPOFOL ;  Surgeon: [name], MD;  Location: [facility];  Service: Gastroenterology;  Laterality: N/A;   DILATION AND EVACUATION N/A 07/28/2015   Procedure: SUCTION, DILATATION AND EVACUATION;  Surgeon: Carlin DELENA Centers, MD;  Location: [facility];  Service: Gynecology;  Laterality: N/A;   ESOPHAGOGASTRODUODENOSCOPY (EGD) WITH PROPOFOL  N/A 09/11/2023   Procedure: ESOPHAGOGASTRODUODENOSCOPY (EGD) WITH PROPOFOL ;  Surgeon: [name], MD;  Location: [facility];  Service: Gastroenterology;  Laterality: N/A;   IR IMAGING GUIDED PORT  INSERTION  10/20/2023   IR US  LIVER BIOPSY  10/20/2023   SUBMUCOSAL INJECTION  09/11/2023   Procedure: INJECTION, SUBMUCOSAL;  Surgeon: [name], MD;  Location: [facility];  Service: Gastroenterology;;   TONSILLECTOMY  AGE 58   TONSILLECTOMY AND ADENOIDECTOMY  AGEB 18    I have reviewed the social history and family history with the patient and they are unchanged from previous note.  ALLERGIES:  is allergic to fruit extracts.  MEDICATIONS:  Current Outpatient Medications  Medication Sig Dispense Refill   doxycycline  (VIBRA -TABS) 100 MG tablet Take 1 tablet (100 mg total) by mouth 2 (two) times daily. 60 tablet 1   Hydrocortisone Acetate (MEDI-CORTISONE EX) Apply 1 application  topically daily. Applied to face once daily     lidocaine -prilocaine  (EMLA ) cream Apply to affected area once (Patient taking differently: Apply 1 Application topically daily as needed (prior to port being accessed.).) 30 g 3   loratadine  (CLARITIN ) 10 MG tablet Take 1 tablet (10 mg total) by mouth daily. 90 tablet 0   methocarbamol  (ROBAXIN ) 500 MG tablet Take 1 tablet (500 mg total) by mouth every 8 (eight) hours as needed for muscle spasms. 20 tablet 1   NON FORMULARY Take 1 Dose by mouth daily. Sea Moss     OVER THE COUNTER MEDICATION Take 2 each by mouth in the morning. Soursop Garviola Gummies     oxyCODONE -acetaminophen  (PERCOCET) 5-325 MG tablet Take 1 tablet by mouth every 6 (six) hours as needed for severe pain (pain score 7-10) or moderate pain (pain score 4-6). Can increase to 2 pills at a time as needed 30 tablet 0   potassium chloride  SA (KLOR-CON  M) 20 MEQ tablet Take 1 tablet (20 mEq total) by mouth daily. 30 tablet 0   Prenatal Vit-Fe Fumarate-FA (MULTIVITAMIN-PRENATAL) 27-0.8 MG TABS tablet Take 1 tablet by mouth in the morning.     magnesium  oxide (MAG-OX) 400 (240 Mg) MG tablet Take 1 tablet (400 mg total) by mouth 2 (two) times daily. 60 tablet 1   omeprazole  (PRILOSEC  OTC) 20 MG tablet Take 1 tablet (20 mg total) by mouth daily. 28 tablet 1   No current facility-administered medications for this visit.   Facility-Administered  Medications Ordered in Other Visits  Medication Dose Route Frequency Provider Last Rate Last Admin   atropine  injection 0.5 mg  0.5 mg Intravenous Once PRN [name], MD       dextrose  5 % solution   Intravenous Continuous [name], MD       fluorouracil  (ADRUCIL ) 7,000 mg in sodium chloride  0.9 % 110 mL chemo infusion  3,200 mg/m2 (Treatment Plan Recorded) Intravenous 1 day or 1 dose [name], MD       irinotecan  (CAMPTOSAR ) 340 mg in sodium chloride  0.9 % 500 mL chemo infusion  150 mg/m2 (Treatment Plan Recorded) Intravenous Once [name], MD       leucovorin  900 mg in dextrose  5 % 250 mL infusion  400 mg/m2 (Treatment Plan Recorded) Intravenous Once [name], MD       magnesium  sulfate IVPB 2 g 50 mL  2 g Intravenous Once [name], MD 50 mL/hr at 04/01/24 1057 2 g at 04/01/24 1057   oxaliplatin  (ELOXATIN ) 200 mg in dextrose  5 % 500 mL chemo infusion  85 mg/m2 (Treatment Plan Recorded) Intravenous Once [name], MD       panitumumab  (VECTIBIX ) 700 mg in sodium chloride  0.9 % 100 mL chemo infusion  6 mg/kg (Treatment Plan Recorded)  Intravenous Once [name], MD        PHYSICAL EXAMINATION: ECOG PERFORMANCE STATUS: 1 - Symptomatic but completely ambulatory  Vitals:   04/01/24 0900  BP: (!) 133/93  Pulse: 79  Resp: 18  Temp: 98.7 F (37.1 C)  SpO2: 100%   Wt Readings from Last 3 Encounters:  04/01/24 247 lb (112 kg)  03/11/24 251 lb 3.2 oz (113.9 kg)  02/19/24 268 lb 15.4 oz (122 kg)     GENERAL:alert, no distress and comfortable SKIN: skin color, texture, turgor are normal, diffuse healing acne rash on face and neck EYES: normal, Conjunctiva are pink and non-injected, sclera clear NECK: supple, thyroid  normal size, non-tender, without nodularity LYMPH:  no palpable lymphadenopathy in the cervical, axillary  LUNGS: clear to auscultation and percussion with normal breathing effort HEART: regular rate & rhythm and no murmurs and no lower extremity edema ABDOMEN:abdomen soft,  non-tender and normal bowel sounds Musculoskeletal:no cyanosis of digits and no clubbing  NEURO: alert & oriented x 3 with fluent speech, no focal motor/sensory deficits  Physical Exam    LABORATORY DATA:  I have reviewed the data as listed    Latest Ref Rng & Units 04/01/2024    9:24 AM 03/18/2024    7:48 AM 03/11/2024    9:10 AM  CBC  WBC 4.0 - 10.5 K/uL 5.8  3.1  3.9   Hemoglobin 12.0 - 15.0 g/dL 89.2  9.7  9.5   Hematocrit 36.0 - 46.0 % 33.2  29.8  29.9   Platelets 150 - 400 K/uL 130  175  238         Latest Ref Rng & Units 04/01/2024    9:24 AM 03/11/2024    9:10 AM 02/17/2024    4:48 AM  CMP  Glucose 70 - 99 mg/dL 895  92  864   BUN 6 - 20 mg/dL 7  9  7    Creatinine 0.44 - 1.00 mg/dL 9.43  9.50  9.41   Sodium 135 - 145 mmol/L 140  140  135   Potassium 3.5 - 5.1 mmol/L 3.4  4.1  4.2   Chloride 98 - 111 mmol/L 105  107  102   CO2 22 - 32 mmol/L 31  28  25    Calcium  8.9 - 10.3 mg/dL 9.0  9.1  8.9   Total Protein 6.5 - 8.1 g/dL 7.0  7.3    Total Bilirubin 0.0 - 1.2 mg/dL 0.3  0.4    Alkaline Phos 38 - 126 U/L 109  71    AST 15 - 41 U/L 16  9    ALT 0 - 44 U/L 14  8        RADIOGRAPHIC STUDIES: I have personally reviewed the radiological images as listed and agreed with the findings in the report. No results found.    Orders Placed This Encounter  Procedures   CBC with Differential (Cancer Center Only)    Standing Status:   Future    Expected Date:   04/15/2024    Expiration Date:   04/15/2025   CMP (Cancer Center only)    Standing Status:   Future    Expected Date:   04/15/2024    Expiration Date:   04/15/2025   CBC with Differential (Cancer Center Only)    Standing Status:   Future    Expected Date:   04/29/2024    Expiration Date:   04/29/2025   CMP (Cancer Center only)    Standing Status:  Future    Expected Date:   04/29/2024    Expiration Date:   04/29/2025   CBC with Differential (Cancer Center Only)    Standing Status:   Future    Expected  Date:   05/13/2024    Expiration Date:   05/13/2025   CMP (Cancer Center only)    Standing Status:   Future    Expected Date:   05/13/2024    Expiration Date:   05/13/2025   CBC with Differential (Cancer Center Only)    Standing Status:   Future    Expected Date:   05/27/2024    Expiration Date:   05/27/2025   CMP (Cancer Center only)    Standing Status:   Future    Expected Date:   05/27/2024    Expiration Date:   05/27/2025   All questions were answered. The patient knows to call the clinic with any problems, questions or concerns. No barriers to learning was detected. The total time spent in the appointment was 30 minutes, including review of chart and various tests results, discussions about plan of care and coordination of care plan     Onita Mattock, MD 04/01/2024

## 2024-04-01 NOTE — Patient Instructions (Signed)
 [facility] - A DEPT OF [facility]  Discharge Instructions: Thank you for choosing [facility] to provide your oncology and hematology care.   If you have a lab appointment with the Cancer Center, please go directly to the Cancer Center and check in at the registration area.   Wear comfortable clothing and clothing appropriate for easy access to any Portacath or PICC line.   We strive to give you quality time with your provider. You may need to reschedule your appointment if you arrive late (15 or more minutes).  Arriving late affects you and other patients whose appointments are after yours.  Also, if you miss three or more appointments without notifying the office, you may be dismissed from the clinic at the provider's discretion.      For prescription refill requests, have your pharmacy contact our office and allow 72 hours for refills to be completed.    Today you received the following chemotherapy and/or immunotherapy agents leucovorin , fluorourcil, irinotecan , oxaliplatin , vectibix       To help prevent nausea and vomiting after your treatment, we encourage you to take your nausea medication as directed.  BELOW ARE SYMPTOMS THAT SHOULD BE REPORTED IMMEDIATELY: *FEVER GREATER THAN 100.4 F (38 C) OR HIGHER *CHILLS OR SWEATING *NAUSEA AND VOMITING THAT IS NOT CONTROLLED WITH YOUR NAUSEA MEDICATION *UNUSUAL SHORTNESS OF BREATH *UNUSUAL BRUISING OR BLEEDING *URINARY PROBLEMS (pain or burning when urinating, or frequent urination) *BOWEL PROBLEMS (unusual diarrhea, constipation, pain near the anus) TENDERNESS IN MOUTH AND THROAT WITH OR WITHOUT PRESENCE OF ULCERS (sore throat, sores in mouth, or a toothache) UNUSUAL RASH, SWELLING OR PAIN  UNUSUAL VAGINAL DISCHARGE OR ITCHING   Items with * indicate a potential emergency and should be followed up as soon as possible or go to the Emergency Department if any problems should occur.  Please  show the CHEMOTHERAPY ALERT CARD or IMMUNOTHERAPY ALERT CARD at check-in to the Emergency Department and triage nurse.  Should you have questions after your visit or need to cancel or reschedule your appointment, please contact [facility] - A DEPT OF [name]  Dept: [phone]  and follow the prompts.  Office hours are 8:00 a.m. to 4:30 p.m. Monday - Friday. Please note that voicemails left after 4:00 p.m. may not be returned until the following business day.  We are closed weekends and major holidays. You have access to a nurse at all times for urgent questions. Please call the main number to the clinic Dept: [phone] and follow the prompts.   For any non-urgent questions, you may also contact your provider using MyChart. We now offer e-Visits for anyone 90 and older to request care online for non-urgent symptoms. For details visit [email or web address].   Also download the MyChart app! Go to the app store, search MyChart, open the app, select [place], and log in with your MyChart username and password.

## 2024-04-03 ENCOUNTER — Other Ambulatory Visit: Payer: Self-pay | Admitting: Hematology

## 2024-04-03 ENCOUNTER — Inpatient Hospital Stay: Attending: Nurse Practitioner

## 2024-04-03 DIAGNOSIS — Z79630 Long term (current) use of alkylating agent: Secondary | ICD-10-CM | POA: Insufficient documentation

## 2024-04-03 DIAGNOSIS — Z5111 Encounter for antineoplastic chemotherapy: Secondary | ICD-10-CM | POA: Diagnosis present

## 2024-04-03 DIAGNOSIS — Z79631 Long term (current) use of antimetabolite agent: Secondary | ICD-10-CM | POA: Diagnosis not present

## 2024-04-03 DIAGNOSIS — Z5112 Encounter for antineoplastic immunotherapy: Secondary | ICD-10-CM | POA: Insufficient documentation

## 2024-04-03 DIAGNOSIS — C787 Secondary malignant neoplasm of liver and intrahepatic bile duct: Secondary | ICD-10-CM | POA: Diagnosis not present

## 2024-04-03 DIAGNOSIS — Z79634 Long term (current) use of topoisomerase inhibitor: Secondary | ICD-10-CM | POA: Insufficient documentation

## 2024-04-03 DIAGNOSIS — Z79899 Other long term (current) drug therapy: Secondary | ICD-10-CM | POA: Diagnosis not present

## 2024-04-03 DIAGNOSIS — C186 Malignant neoplasm of descending colon: Secondary | ICD-10-CM | POA: Diagnosis present

## 2024-04-03 MED ORDER — PEGFILGRASTIM-CBQV 6 MG/0.6ML ~~LOC~~ SOSY
6.0000 mg | PREFILLED_SYRINGE | Freq: Once | SUBCUTANEOUS | Status: AC
  Administered 2024-04-03: 6 mg via SUBCUTANEOUS
  Filled 2024-04-03: qty 0.6

## 2024-04-03 NOTE — Patient Instructions (Signed)

## 2024-04-12 DIAGNOSIS — H5213 Myopia, bilateral: Secondary | ICD-10-CM | POA: Diagnosis not present

## 2024-04-12 NOTE — Assessment & Plan Note (Signed)
-  Stage IV with oligometastasis, MSS, HER2 (+), UGT1A1 (+), KRAS/NRAS/BRAF wild type -Presented with abdominal pain, change of bowel habits, and a 50 pound weight loss. -Colonoscopy showed malignant obstructive tumor in the distal descending colon, biopsy showed moderate differentiated invasive adenocarcinoma. -CT scan showed a 2.6 cm oligo liver metastasis.  Biopsy is pending -Patient was seen by colorectal surgeon Dr. Sheldon on October 10, 2023. -Would recommend neoadjuvant chemotherapy for 3 months, followed by surgery (left hemicolectomy, liver resection), or liver ablation or radiation if she respond well to chemo  -She started chemo FOLFOXIRI on 10/24/2023 -I discussed the NGS Tempus result and recommending Vectibix  to her chemo. She started from cycle 4 chemo  -restaging CT 6/24 showed slightly decreased colon and liver mets, no new lesions.  -Surgeon Dr. Dasie reviewed her case and recommend pt to be referred to [facility] for liver resection   - She underwent a hemicolectomy by Dr. Sheldon on February 16, 2024.  Surgical pathology showed T4bN0 with clear margins  -will continue chemo for 3 more months before liver resection

## 2024-04-15 ENCOUNTER — Inpatient Hospital Stay

## 2024-04-15 ENCOUNTER — Encounter: Payer: Self-pay | Admitting: Hematology

## 2024-04-15 ENCOUNTER — Inpatient Hospital Stay (HOSPITAL_BASED_OUTPATIENT_CLINIC_OR_DEPARTMENT_OTHER): Admitting: Hematology

## 2024-04-15 VITALS — BP 138/82 | HR 77 | Temp 98.9°F | Resp 17 | Wt 248.9 lb

## 2024-04-15 DIAGNOSIS — C186 Malignant neoplasm of descending colon: Secondary | ICD-10-CM

## 2024-04-15 DIAGNOSIS — Z5111 Encounter for antineoplastic chemotherapy: Secondary | ICD-10-CM | POA: Diagnosis not present

## 2024-04-15 LAB — CBC WITH DIFFERENTIAL (CANCER CENTER ONLY)
Abs Immature Granulocytes: 0.37 K/uL — ABNORMAL HIGH (ref 0.00–0.07)
Basophils Absolute: 0 K/uL (ref 0.0–0.1)
Basophils Relative: 1 %
Eosinophils Absolute: 0.1 K/uL (ref 0.0–0.5)
Eosinophils Relative: 3 %
HCT: 32.9 % — ABNORMAL LOW (ref 36.0–46.0)
Hemoglobin: 10.6 g/dL — ABNORMAL LOW (ref 12.0–15.0)
Immature Granulocytes: 8 %
Lymphocytes Relative: 24 %
Lymphs Abs: 1.2 K/uL (ref 0.7–4.0)
MCH: 24.8 pg — ABNORMAL LOW (ref 26.0–34.0)
MCHC: 32.2 g/dL (ref 30.0–36.0)
MCV: 76.9 fL — ABNORMAL LOW (ref 80.0–100.0)
Monocytes Absolute: 0.6 K/uL (ref 0.1–1.0)
Monocytes Relative: 13 %
Neutro Abs: 2.6 K/uL (ref 1.7–7.7)
Neutrophils Relative %: 51 %
Platelet Count: 114 K/uL — ABNORMAL LOW (ref 150–400)
RBC: 4.28 MIL/uL (ref 3.87–5.11)
RDW: 15.4 % (ref 11.5–15.5)
WBC Count: 4.9 K/uL (ref 4.0–10.5)
nRBC: 0 % (ref 0.0–0.2)

## 2024-04-15 LAB — CMP (CANCER CENTER ONLY)
ALT: 13 U/L (ref 0–44)
AST: 15 U/L (ref 15–41)
Albumin: 3.6 g/dL (ref 3.5–5.0)
Alkaline Phosphatase: 102 U/L (ref 38–126)
Anion gap: 6 (ref 5–15)
BUN: 7 mg/dL (ref 6–20)
CO2: 28 mmol/L (ref 22–32)
Calcium: 9.3 mg/dL (ref 8.9–10.3)
Chloride: 107 mmol/L (ref 98–111)
Creatinine: 0.49 mg/dL (ref 0.44–1.00)
GFR, Estimated: >60 mL/min (ref >60)
Glucose, Bld: 91 mg/dL (ref 70–99)
Potassium: 3.5 mmol/L (ref 3.5–5.1)
Sodium: 141 mmol/L (ref 135–145)
Total Bilirubin: 0.4 mg/dL (ref 0.0–1.2)
Total Protein: 7.1 g/dL (ref 6.5–8.1)

## 2024-04-15 LAB — MAGNESIUM: Magnesium: 1.9 mg/dL (ref 1.7–2.4)

## 2024-04-15 MED ORDER — PALONOSETRON HCL INJECTION 0.25 MG/5ML
0.2500 mg | Freq: Once | INTRAVENOUS | Status: AC
  Administered 2024-04-15: 0.25 mg via INTRAVENOUS
  Filled 2024-04-15: qty 5

## 2024-04-15 MED ORDER — MAGNESIUM SULFATE 2 GM/50ML IV SOLN
2.0000 g | Freq: Once | INTRAVENOUS | Status: AC
  Administered 2024-04-15: 2 g via INTRAVENOUS
  Filled 2024-04-15: qty 50

## 2024-04-15 MED ORDER — FAMOTIDINE IN NACL 20-0.9 MG/50ML-% IV SOLN
20.0000 mg | Freq: Once | INTRAVENOUS | Status: AC
  Administered 2024-04-15: 20 mg via INTRAVENOUS
  Filled 2024-04-15: qty 50

## 2024-04-15 MED ORDER — SODIUM CHLORIDE 0.9 % IV SOLN
3200.0000 mg/m2 | INTRAVENOUS | Status: DC
  Administered 2024-04-15: 7000 mg via INTRAVENOUS
  Filled 2024-04-15: qty 140

## 2024-04-15 MED ORDER — SODIUM CHLORIDE 0.9 % IV SOLN: Freq: Once | INTRAVENOUS | Status: AC

## 2024-04-15 MED ORDER — DEXTROSE 5 % IV SOLN: INTRAVENOUS | Status: DC

## 2024-04-15 MED ORDER — DEXAMETHASONE SOD PHOSPHATE PF 10 MG/ML IJ SOLN
10.0000 mg | Freq: Once | INTRAMUSCULAR | Status: AC
  Administered 2024-04-15: 10 mg via INTRAVENOUS

## 2024-04-15 MED ORDER — SODIUM CHLORIDE 0.9 % IV SOLN
150.0000 mg/m2 | Freq: Once | INTRAVENOUS | Status: AC
  Administered 2024-04-15: 340 mg via INTRAVENOUS
  Filled 2024-04-15: qty 15

## 2024-04-15 MED ORDER — SODIUM CHLORIDE 0.9% FLUSH: 10.0000 mL | INTRAVENOUS | Status: DC | PRN

## 2024-04-15 MED ORDER — LEUCOVORIN CALCIUM INJECTION 350 MG
400.0000 mg/m2 | Freq: Once | INTRAVENOUS | Status: AC
  Administered 2024-04-15: 900 mg via INTRAVENOUS
  Filled 2024-04-15: qty 45

## 2024-04-15 MED ORDER — ATROPINE SULFATE 1 MG/ML IV SOLN
0.5000 mg | Freq: Once | INTRAVENOUS | Status: AC | PRN
  Administered 2024-04-15: 0.5 mg via INTRAVENOUS
  Filled 2024-04-15: qty 1

## 2024-04-15 MED ORDER — OXALIPLATIN CHEMO INJECTION 100 MG/20ML
85.0000 mg/m2 | Freq: Once | INTRAVENOUS | Status: AC
  Administered 2024-04-15: 200 mg via INTRAVENOUS
  Filled 2024-04-15: qty 40

## 2024-04-15 MED ORDER — DIPHENHYDRAMINE HCL 25 MG PO CAPS
25.0000 mg | ORAL_CAPSULE | Freq: Once | ORAL | Status: AC
  Administered 2024-04-15: 25 mg via ORAL
  Filled 2024-04-15: qty 1

## 2024-04-15 MED ORDER — APREPITANT 130 MG/18ML IV EMUL
130.0000 mg | Freq: Once | INTRAVENOUS | Status: AC
  Administered 2024-04-15: 130 mg via INTRAVENOUS
  Filled 2024-04-15: qty 18

## 2024-04-15 MED ORDER — SODIUM CHLORIDE 0.9 % IV SOLN
6.0000 mg/kg | Freq: Once | INTRAVENOUS | Status: AC
  Administered 2024-04-15: 700 mg via INTRAVENOUS
  Filled 2024-04-15: qty 15

## 2024-04-15 NOTE — Progress Notes (Signed)
 [facility]   [phone] Fax:([phone]   Clinic Follow up Note   Patient Care Team: [name], MD as PCP - General (Family Medicine) Ashley, [name], RN as Oncology Nurse Navigator Ashley Groom, MD as Consulting Physician (Gastroenterology) Ashley Callander, MD as Consulting Physician (Medical Oncology) Ashley Carlin LABOR, MD as Consulting Physician (Obstetrics and Gynecology) Ashley Standing, MD as Consulting Physician (General Surgery) Ashley Leonor CROME, MD as Consulting Physician (General Surgery)  Date of Service:  04/15/2024  CHIEF COMPLAINT: f/u of metastatic colon cancer to liver  CURRENT THERAPY:  Chemotherapy FOLFOXIRI every 2 weeks  Oncology History   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 -Colonoscopy showed malignant obstructive tumor in the distal descending colon, biopsy showed moderate differentiated invasive adenocarcinoma. -CT scan showed a 2.6 cm oligo liver metastasis.  Biopsy is pending -Patient was seen by colorectal surgeon Dr. Sheldon on October 10, 2023. -Would recommend neoadjuvant chemotherapy for 3 months, followed by surgery (left hemicolectomy, liver resection), or liver ablation or radiation if she respond well to chemo  -She started chemo FOLFOXIRI on 10/24/2023 -I discussed the NGS Tempus result and recommending Vectibix  to her chemo. She started from cycle 4 chemo  -restaging CT 6/24 showed slightly decreased colon and liver mets, no new lesions.  -Surgeon Dr. Dasie reviewed her case and recommend pt to be referred to [facility] for liver resection   - She underwent a hemicolectomy by Dr. Sheldon on February 16, 2024.  Surgical pathology showed T4bN0 with clear margins  -will continue chemo for 3 more months before liver resection   Assessment & Plan Metastatic colon  cancer Metastatic colon cancer with previous surgery on February 16, 2024, showing invasive moderately differentiated adenocarcinoma, 4 cm, with no lymph node involvement. Currently undergoing chemotherapy. She expresses nervousness about upcoming treatments and surgery, acknowledging the larger scale compared to previous colon surgery. 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 Symptoms are not significantly affecting daily activities such as cooking or buttoning shirts. - Monitor symptoms of neuropathy, particularly in fingers and toes - Consider dose reduction of chemotherapy if neuropathy symptoms worsen  Facial rash Facial rash present, not significantly bothersome to her. No signs of infection. - Continue current management with topical cream as needed  Plan - She is clinically doing well, will continue chemo - Follow-up in 2 weeks   SUMMARY OF ONCOLOGIC HISTORY: Oncology History  Adenocarcinoma of descending colon (HCC)  09/11/2023 Cancer Staging   Staging form: Colon and Rectum, AJCC 8th Edition - Clinical stage from 09/11/2023: Stage IVA (cTX, cN1, cM1a) - Signed by Ashley Callander, MD on 10/12/2023   10/01/2023 Initial Diagnosis   Adenocarcinoma of descending colon (HCC)   10/24/2023 -  Chemotherapy   Patient is on Treatment Plan : COLORECTAL FOLFOXIRI q14d      Genetic Testing   Negative CancerNext +RNAinsight. The [employer] CancerNext+RNAinsight Panel includes sequencing, rearrangement analysis, and RNA analysis for the following 39 genes: APC, ATM, BAP1, BARD1, BMPR1A, BRCA1, BRCA2, BRIP1, CDH1, CDKN2A, CHEK2, FH, FLCN, MET, MLH1, MSH2, MSH6, MUTYH,  NF1, NTHL1, PALB2, PMS2,  PTEN, RAD51C, RAD51D, SMAD4, STK11, TP53, TSC1, TSC2, and VHL (sequencing and deletion/duplication); AXIN2, HOXB13, MBD4, MSH3, POLD1 and POLE (sequencing only); EPCAM and GREM1 (deletion/duplication only). Report date 11/04/23.       Discussed the use of AI scribe software for clinical note transcription with the patient, who gave verbal consent to proceed.  History of Present Illness Ashley [name] is a 38 year old female with metastatic colon cancer who presents for follow-up.  Her last treatment was well-tolerated with no new issues, experiencing the usual side effects. She has a facial rash managed with cream to prevent dryness, which is not significantly bothersome. Mild tingling is present in her fingers and toes, particularly in her big toes, but it is not severe. She is able to perform daily activities such as buttoning her shirt and cooking, with occasional assistance from her oldest son and fianc.  Bowel movements are regular, and she used the bathroom this morning. Her appetite is good, and she is eating well.  She recalls a previous surgery on August 15, where a cancerous tumor was removed from her colon.     All other systems were reviewed with the patient and are negative.  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   Irregular periods/menstrual cycles 01/30/2012   Late prenatal care 01/30/2012   Poor social situation 01/30/2012   Previous cesarean section complicating pregnancy, antepartum condition or complication 01/27/2016   2 previous cesarean section- Desires repeat with BTL   Single liveborn, born in hospital, delivered by cesarean delivery 08/13/2016   Status post repeat low transverse cesarean section 08/14/2016   Status post tubal ligation at time of delivery,  current hosp 08/14/2016    SURGICAL HISTORY: Past Surgical History:  Procedure Laterality Date   CESAREAN SECTION  2009   CESAREAN SECTION  01/30/2012   Procedure: CESAREAN SECTION;  Surgeon: Ovid DELENA All, MD;  Location: [facility];  Service: Gynecology;  Laterality: N/A;  Repeat C/S   CESAREAN SECTION WITH BILATERAL TUBAL LIGATION Bilateral 08/13/2016   Procedure: CESAREAN SECTION WITH BILATERAL TUBAL LIGATION;  Surgeon: Burnard VEAR Pate, MD;  Location: [facility];  Service: Obstetrics;  Laterality: Bilateral;   CHOLECYSTECTOMY N/A 10/16/2013   Procedure: LAPAROSCOPIC CHOLECYSTECTOMY WITH INTRAOPERATIVE CHOLANGIOGRAM;  Surgeon: Donnice POUR. Belinda, MD;  Location: [facility];  Service: General;  Laterality: N/A;   COLONOSCOPY WITH PROPOFOL  N/A 09/11/2023   Procedure: COLONOSCOPY WITH PROPOFOL ;  Surgeon: Ashley Groom, MD;  Location: [facility];  Service: Gastroenterology;  Laterality: N/A;   DILATION AND EVACUATION N/A 07/28/2015   Procedure: SUCTION, DILATATION AND EVACUATION;  Surgeon: Carlin DELENA Centers, MD;  Location: [facility];  Service: Gynecology;  Laterality: N/A;   ESOPHAGOGASTRODUODENOSCOPY (EGD) WITH PROPOFOL  N/A 09/11/2023   Procedure: ESOPHAGOGASTRODUODENOSCOPY (EGD) WITH PROPOFOL ;  Surgeon: Ashley Groom, MD;  Location: [facility];  Service: Gastroenterology;  Laterality: N/A;   IR IMAGING GUIDED PORT INSERTION  10/20/2023   IR US  LIVER BIOPSY  10/20/2023   SUBMUCOSAL INJECTION  09/11/2023   Procedure: INJECTION, SUBMUCOSAL;  Surgeon: Ashley Groom, MD;  Location: [facility];  Service: Gastroenterology;;   TONSILLECTOMY  AGE 26   TONSILLECTOMY AND ADENOIDECTOMY  AGEB 18    I have reviewed the social history and family history with the patient and they are unchanged from  previous note.  ALLERGIES:  is allergic to fruit extracts.  MEDICATIONS:  Current Outpatient Medications  Medication Sig Dispense Refill   doxycycline  (VIBRA -TABS) 100 MG tablet TAKE 1 TABLET BY MOUTH TWICE A DAY  60 tablet 1   Hydrocortisone Acetate (MEDI-CORTISONE EX) Apply 1 application  topically daily. Applied to face once daily     lidocaine -prilocaine  (EMLA ) cream Apply to affected area once (Patient taking differently: Apply 1 Application topically daily as needed (prior to port being accessed.).) 30 g 3   loratadine  (CLARITIN ) 10 MG tablet Take 1 tablet (10 mg total) by mouth daily. 90 tablet 0   magnesium  oxide (MAG-OX) 400 (240 Mg) MG tablet Take 1 tablet (400 mg total) by mouth 2 (two) times daily. 60 tablet 1   methocarbamol  (ROBAXIN ) 500 MG tablet Take 1 tablet (500 mg total) by mouth every 8 (eight) hours as needed for muscle spasms. 20 tablet 1   NON FORMULARY Take 1 Dose by mouth daily. [place]     omeprazole  (PRILOSEC  OTC) 20 MG tablet Take 1 tablet (20 mg total) by mouth daily. 28 tablet 1   OVER THE COUNTER MEDICATION Take 2 each by mouth in the morning. Soursop Garviola Gummies     oxyCODONE -acetaminophen  (PERCOCET) 5-325 MG tablet Take 1 tablet by mouth every 6 (six) hours as needed for severe pain (pain score 7-10) or moderate pain (pain score 4-6). Can increase to 2 pills at a time as needed 30 tablet 0   potassium chloride  SA (KLOR-CON  M) 20 MEQ tablet Take 1 tablet (20 mEq total) by mouth daily. 30 tablet 0   Prenatal Vit-Fe Fumarate-FA (MULTIVITAMIN-PRENATAL) 27-0.8 MG TABS tablet Take 1 tablet by mouth in the morning.     No current facility-administered medications for this visit.   Facility-Administered Medications Ordered in Other Visits  Medication Dose Route Frequency Provider Last Rate Last Admin   dextrose  5 % solution   Intravenous Continuous Ashley Callander, MD   Stopped at 04/15/24 1413   fluorouracil  (ADRUCIL ) 7,000 mg in sodium chloride  0.9 % 110 mL chemo infusion  3,200 mg/m2 (Treatment Plan Recorded) Intravenous 1 day or 1 dose Ashley Callander, MD       leucovorin  900 mg in dextrose  5 % 250 mL infusion  400 mg/m2 (Treatment Plan Recorded) Intravenous Once Ashley Callander, MD 148  mL/hr at 04/15/24 1527 Infusion Verify at 04/15/24 1527   oxaliplatin  (ELOXATIN ) 200 mg in dextrose  5 % 500 mL chemo infusion  85 mg/m2 (Treatment Plan Recorded) Intravenous Once Ashley Callander, MD 270 mL/hr at 04/15/24 1527 Infusion Verify at 04/15/24 1527   sodium chloride  flush (NS) 0.9 % injection 10 mL  10 mL Intracatheter PRN Ashley Callander, MD        PHYSICAL EXAMINATION: ECOG PERFORMANCE STATUS: 1 - Symptomatic but completely ambulatory  Vitals:   04/15/24 0851  BP: 138/82  Pulse: 77  Resp: 17  Temp: 98.9 F (37.2 C)  SpO2: 98%   Wt Readings from Last 3 Encounters:  04/15/24 248 lb 14.4 oz (112.9 kg)  04/01/24 247 lb (112 kg)  03/11/24 251 lb 3.2 oz (113.9 kg)     GENERAL:alert, no distress and comfortable SKIN: skin color, texture, turgor are normal, diffuse acne-like rash in face and neck EYES: normal, Conjunctiva are pink and non-injected, sclera clear NECK: supple, thyroid  normal size, non-tender, without nodularity LYMPH:  no palpable lymphadenopathy in the cervical, axillary  LUNGS: clear to auscultation and percussion with normal breathing effort HEART: regular rate &  rhythm and no murmurs and no lower extremity edema ABDOMEN:abdomen soft, non-tender and normal bowel sounds Musculoskeletal:no cyanosis of digits and no clubbing  NEURO: alert & oriented x 3 with fluent speech, no focal motor/sensory deficits  Physical Exam    LABORATORY DATA:  I have reviewed the data as listed    Latest Ref Rng & Units 04/15/2024    8:33 AM 04/01/2024    9:24 AM 03/18/2024    7:48 AM  CBC  WBC 4.0 - 10.5 K/uL 4.9  5.8  3.1   Hemoglobin 12.0 - 15.0 g/dL 89.3  89.2  9.7   Hematocrit 36.0 - 46.0 % 32.9  33.2  29.8   Platelets 150 - 400 K/uL 114  130  175         Latest Ref Rng & Units 04/15/2024    8:33 AM 04/01/2024    9:24 AM 03/11/2024    9:10 AM  CMP  Glucose 70 - 99 mg/dL 91  895  92   BUN 6 - 20 mg/dL 7  7  9    Creatinine 0.44 - 1.00 mg/dL 9.50  9.43  9.50   Sodium 135  - 145 mmol/L 141  140  140   Potassium 3.5 - 5.1 mmol/L 3.5  3.4  4.1   Chloride 98 - 111 mmol/L 107  105  107   CO2 22 - 32 mmol/L 28  31  28    Calcium  8.9 - 10.3 mg/dL 9.3  9.0  9.1   Total Protein 6.5 - 8.1 g/dL 7.1  7.0  7.3   Total Bilirubin 0.0 - 1.2 mg/dL 0.4  0.3  0.4   Alkaline Phos 38 - 126 U/L 102  109  71   AST 15 - 41 U/L 15  16  9    ALT 0 - 44 U/L 13  14  8        RADIOGRAPHIC STUDIES: I have personally reviewed the radiological images as listed and agreed with the findings in the report. No results found.    Orders Placed This Encounter  Procedures   CBC with Differential (Cancer Center Only)    [name] Status:   Future    Expected Date:   06/10/2024    Expiration Date:   06/10/2025   CMP (Cancer Center only)    [name] Status:   Future    Expected Date:   06/10/2024    Expiration Date:   06/10/2025   All questions were answered. The patient knows to call the clinic with any problems, questions or concerns. No barriers to learning was detected. The total time spent in the appointment was 25 minutes, including review of chart and various tests results, discussions about plan of care and coordination of care plan     Onita Mattock, MD 04/15/2024

## 2024-04-15 NOTE — Patient Instructions (Signed)
 [facility] - A DEPT OF [facility]  Discharge Instructions: Thank you for choosing [facility] to provide your oncology and hematology care.   If you have a lab appointment with the Cancer Center, please go directly to the Cancer Center and check in at the registration area.   Wear comfortable clothing and clothing appropriate for easy access to any Portacath or PICC line.   We strive to give you quality time with your provider. You may need to reschedule your appointment if you arrive late (15 or more minutes).  Arriving late affects you and other patients whose appointments are after yours.  Also, if you miss three or more appointments without notifying the office, you may be dismissed from the clinic at the provider's discretion.      For prescription refill requests, have your pharmacy contact our office and allow 72 hours for refills to be completed.    Today you received the following chemotherapy and/or immunotherapy agents vectibix , irinotecan , oxaliplatin , leucovorin , adrucil       To help prevent nausea and vomiting after your treatment, we encourage you to take your nausea medication as directed.  BELOW ARE SYMPTOMS THAT SHOULD BE REPORTED IMMEDIATELY: *FEVER GREATER THAN 100.4 F (38 C) OR HIGHER *CHILLS OR SWEATING *NAUSEA AND VOMITING THAT IS NOT CONTROLLED WITH YOUR NAUSEA MEDICATION *UNUSUAL SHORTNESS OF BREATH *UNUSUAL BRUISING OR BLEEDING *URINARY PROBLEMS (pain or burning when urinating, or frequent urination) *BOWEL PROBLEMS (unusual diarrhea, constipation, pain near the anus) TENDERNESS IN MOUTH AND THROAT WITH OR WITHOUT PRESENCE OF ULCERS (sore throat, sores in mouth, or a toothache) UNUSUAL RASH, SWELLING OR PAIN  UNUSUAL VAGINAL DISCHARGE OR ITCHING   Items with * indicate a potential emergency and should be followed up as soon as possible or go to the Emergency Department if any problems should occur.  Please show  the CHEMOTHERAPY ALERT CARD or IMMUNOTHERAPY ALERT CARD at check-in to the Emergency Department and triage nurse.  Should you have questions after your visit or need to cancel or reschedule your appointment, please contact [facility] - A DEPT OF [name]  Dept: [phone]  and follow the prompts.  Office hours are 8:00 a.m. to 4:30 p.m. Monday - Friday. Please note that voicemails left after 4:00 p.m. may not be returned until the following business day.  We are closed weekends and major holidays. You have access to a nurse at all times for urgent questions. Please call the main number to the clinic Dept: [phone] and follow the prompts.   For any non-urgent questions, you may also contact your provider using MyChart. We now offer e-Visits for anyone 109 and older to request care online for non-urgent symptoms. For details visit [email or web address].   Also download the MyChart app! Go to the app store, search MyChart, open the app, select [facility], and log in with your MyChart username and password.

## 2024-04-17 ENCOUNTER — Inpatient Hospital Stay

## 2024-04-17 DIAGNOSIS — C186 Malignant neoplasm of descending colon: Secondary | ICD-10-CM

## 2024-04-17 DIAGNOSIS — Z5111 Encounter for antineoplastic chemotherapy: Secondary | ICD-10-CM | POA: Diagnosis not present

## 2024-04-17 MED ORDER — PEGFILGRASTIM-CBQV 6 MG/0.6ML ~~LOC~~ SOSY
6.0000 mg | PREFILLED_SYRINGE | Freq: Once | SUBCUTANEOUS | Status: AC
  Administered 2024-04-17: 6 mg via SUBCUTANEOUS
  Filled 2024-04-17: qty 0.6

## 2024-04-17 NOTE — Patient Instructions (Signed)

## 2024-04-22 ENCOUNTER — Inpatient Hospital Stay: Admitting: Nurse Practitioner

## 2024-04-24 NOTE — Progress Notes (Signed)
 Erroneous

## 2024-04-28 NOTE — Assessment & Plan Note (Signed)
-  Stage IV with oligometastasis, MSS, HER2 (+), UGT1A1 (+), KRAS/NRAS/BRAF wild type -Presented with abdominal pain, change of bowel habits, and a 50 pound weight loss. -Colonoscopy showed malignant obstructive tumor in the distal descending colon, biopsy showed moderate differentiated invasive adenocarcinoma. -CT scan showed a 2.6 cm oligo liver metastasis.  Biopsy is pending -Patient was seen by colorectal surgeon Dr. Sheldon on October 10, 2023. -Would recommend neoadjuvant chemotherapy for 3 months, followed by surgery (left hemicolectomy, liver resection), or liver ablation or radiation if she respond well to chemo  -She started chemo FOLFOXIRI on 10/24/2023 -I discussed the NGS Tempus result and recommending Vectibix  to her chemo. She started from cycle 4 chemo  -restaging CT 6/24 showed slightly decreased colon and liver mets, no new lesions.  -Surgeon Dr. Dasie reviewed her case and recommend pt to be referred to [facility] for liver resection   - She underwent a hemicolectomy by Dr. Sheldon on February 16, 2024.  Surgical pathology showed T4bN0 with clear margins  -will continue chemo for 3 more months before liver resection

## 2024-04-29 ENCOUNTER — Inpatient Hospital Stay

## 2024-04-29 ENCOUNTER — Inpatient Hospital Stay: Admitting: Hematology

## 2024-04-29 VITALS — BP 120/78 | HR 96 | Temp 98.2°F | Resp 18 | Ht 64.0 in | Wt 250.2 lb

## 2024-04-29 DIAGNOSIS — C186 Malignant neoplasm of descending colon: Secondary | ICD-10-CM

## 2024-04-29 DIAGNOSIS — Z5111 Encounter for antineoplastic chemotherapy: Secondary | ICD-10-CM | POA: Diagnosis not present

## 2024-04-29 LAB — CBC WITH DIFFERENTIAL (CANCER CENTER ONLY)
Abs Immature Granulocytes: 0.18 K/uL — ABNORMAL HIGH (ref 0.00–0.07)
Basophils Absolute: 0 K/uL (ref 0.0–0.1)
Basophils Relative: 0 %
Eosinophils Absolute: 0.1 K/uL (ref 0.0–0.5)
Eosinophils Relative: 3 %
HCT: 33.2 % — ABNORMAL LOW (ref 36.0–46.0)
Hemoglobin: 10.6 g/dL — ABNORMAL LOW (ref 12.0–15.0)
Immature Granulocytes: 4 %
Lymphocytes Relative: 27 %
Lymphs Abs: 1.3 K/uL (ref 0.7–4.0)
MCH: 24.4 pg — ABNORMAL LOW (ref 26.0–34.0)
MCHC: 31.9 g/dL (ref 30.0–36.0)
MCV: 76.5 fL — ABNORMAL LOW (ref 80.0–100.0)
Monocytes Absolute: 0.7 K/uL (ref 0.1–1.0)
Monocytes Relative: 14 %
Neutro Abs: 2.5 K/uL (ref 1.7–7.7)
Neutrophils Relative %: 52 %
Platelet Count: 94 K/uL — ABNORMAL LOW (ref 150–400)
RBC: 4.34 MIL/uL (ref 3.87–5.11)
RDW: 15.7 % — ABNORMAL HIGH (ref 11.5–15.5)
WBC Count: 4.8 K/uL (ref 4.0–10.5)
nRBC: 0 % (ref 0.0–0.2)

## 2024-04-29 LAB — CMP (CANCER CENTER ONLY)
ALT: 13 U/L (ref 0–44)
AST: 15 U/L (ref 15–41)
Albumin: 3.5 g/dL (ref 3.5–5.0)
Alkaline Phosphatase: 118 U/L (ref 38–126)
Anion gap: 5 (ref 5–15)
BUN: 6 mg/dL (ref 6–20)
CO2: 29 mmol/L (ref 22–32)
Calcium: 9 mg/dL (ref 8.9–10.3)
Chloride: 106 mmol/L (ref 98–111)
Creatinine: 0.52 mg/dL (ref 0.44–1.00)
GFR, Estimated: >60 mL/min (ref >60)
Glucose, Bld: 92 mg/dL (ref 70–99)
Potassium: 3.5 mmol/L (ref 3.5–5.1)
Sodium: 140 mmol/L (ref 135–145)
Total Bilirubin: 0.3 mg/dL (ref 0.0–1.2)
Total Protein: 7.1 g/dL (ref 6.5–8.1)

## 2024-04-29 LAB — MAGNESIUM: Magnesium: 1.8 mg/dL (ref 1.7–2.4)

## 2024-04-29 LAB — PREGNANCY, URINE: Preg Test, Ur: NEGATIVE

## 2024-04-29 MED ORDER — SODIUM CHLORIDE 0.9 % IV SOLN: Freq: Once | INTRAVENOUS | Status: AC

## 2024-04-29 MED ORDER — SODIUM CHLORIDE 0.9 % IV SOLN
6200.0000 mg | INTRAVENOUS | Status: DC
  Administered 2024-04-29: 6200 mg via INTRAVENOUS
  Filled 2024-04-29: qty 124

## 2024-04-29 MED ORDER — LEUCOVORIN CALCIUM INJECTION 350 MG
400.0000 mg/m2 | Freq: Once | INTRAVENOUS | Status: AC
  Administered 2024-04-29: 900 mg via INTRAVENOUS
  Filled 2024-04-29: qty 45

## 2024-04-29 MED ORDER — DIPHENHYDRAMINE HCL 25 MG PO CAPS
25.0000 mg | ORAL_CAPSULE | Freq: Once | ORAL | Status: AC
  Administered 2024-04-29: 25 mg via ORAL
  Filled 2024-04-29: qty 1

## 2024-04-29 MED ORDER — FAMOTIDINE IN NACL 20-0.9 MG/50ML-% IV SOLN
20.0000 mg | Freq: Once | INTRAVENOUS | Status: AC
  Administered 2024-04-29: 20 mg via INTRAVENOUS
  Filled 2024-04-29: qty 50

## 2024-04-29 MED ORDER — APREPITANT 130 MG/18ML IV EMUL
130.0000 mg | Freq: Once | INTRAVENOUS | Status: AC
  Administered 2024-04-29: 130 mg via INTRAVENOUS
  Filled 2024-04-29: qty 18

## 2024-04-29 MED ORDER — SODIUM CHLORIDE 0.9 % IV SOLN
150.0000 mg/m2 | Freq: Once | INTRAVENOUS | Status: AC
  Administered 2024-04-29: 340 mg via INTRAVENOUS
  Filled 2024-04-29: qty 15

## 2024-04-29 MED ORDER — DEXTROSE 5 % IV SOLN: INTRAVENOUS | Status: DC

## 2024-04-29 MED ORDER — PALONOSETRON HCL INJECTION 0.25 MG/5ML
0.2500 mg | Freq: Once | INTRAVENOUS | Status: AC
  Administered 2024-04-29: 0.25 mg via INTRAVENOUS
  Filled 2024-04-29: qty 5

## 2024-04-29 MED ORDER — ATROPINE SULFATE 1 MG/ML IV SOLN
0.5000 mg | Freq: Once | INTRAVENOUS | Status: AC | PRN
  Administered 2024-04-29: 0.5 mg via INTRAVENOUS
  Filled 2024-04-29: qty 1

## 2024-04-29 MED ORDER — DEXAMETHASONE SOD PHOSPHATE PF 10 MG/ML IJ SOLN
10.0000 mg | Freq: Once | INTRAMUSCULAR | Status: AC
  Administered 2024-04-29: 10 mg via INTRAVENOUS

## 2024-04-29 MED ORDER — OXALIPLATIN CHEMO INJECTION 100 MG/20ML
85.0000 mg/m2 | Freq: Once | INTRAVENOUS | Status: AC
  Administered 2024-04-29: 200 mg via INTRAVENOUS
  Filled 2024-04-29: qty 40

## 2024-04-29 MED ORDER — SODIUM CHLORIDE 0.9 % IV SOLN: 2800.0000 mg/m2 | INTRAVENOUS | Status: DC

## 2024-04-29 MED ORDER — POTASSIUM CHLORIDE CRYS ER 20 MEQ PO TBCR: 20.0000 meq | EXTENDED_RELEASE_TABLET | Freq: Every day | ORAL | 1 refills | Status: AC

## 2024-04-29 MED ORDER — MAGNESIUM SULFATE 2 GM/50ML IV SOLN
2.0000 g | Freq: Once | INTRAVENOUS | Status: AC
  Administered 2024-04-29: 2 g via INTRAVENOUS
  Filled 2024-04-29: qty 50

## 2024-04-29 MED ORDER — SODIUM CHLORIDE 0.9 % IV SOLN
6.0000 mg/kg | Freq: Once | INTRAVENOUS | Status: AC
  Administered 2024-04-29: 700 mg via INTRAVENOUS
  Filled 2024-04-29: qty 15

## 2024-04-29 NOTE — Progress Notes (Signed)
 [facility]   [phone] [phone]   Clinic Follow up Note   Patient Care Team: [name], MD as PCP - General (Family Medicine) [name], [name], RN as Oncology Nurse Navigator [name], MD as Consulting Physician (Gastroenterology) [name], MD as Consulting Physician (Medical Oncology) [name], MD as Consulting Physician (Obstetrics and Gynecology) [name], MD as Consulting Physician (General Surgery) [name], MD as Consulting Physician (General Surgery)  Date of Service:  04/29/2024  CHIEF COMPLAINT: f/u of metastatic colon cancer  CURRENT THERAPY:  Chemotherapy FOLFOXIRI and Vectibix  every 2 weeks  Oncology History   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 -Colonoscopy showed malignant obstructive tumor in the distal descending colon, biopsy showed moderate differentiated invasive adenocarcinoma. -CT scan showed a 2.6 cm oligo liver metastasis.  Biopsy is pending -Patient was seen by colorectal surgeon Dr. Sheldon on October 10, 2023. -Would recommend neoadjuvant chemotherapy for 3 months, followed by surgery (left hemicolectomy, liver resection), or liver ablation or radiation if she respond well to chemo  -She started chemo FOLFOXIRI on 10/24/2023 -I discussed the NGS Tempus result and recommending Vectibix  to her chemo. She started from cycle 4 chemo  -restaging CT 6/24 showed slightly decreased colon and liver mets, no new lesions.  -Surgeon Dr. Dasie reviewed her case and recommend pt to be referred to [facility] for liver resection   - She underwent a hemicolectomy by Dr. Sheldon on February 16, 2024.  Surgical pathology showed T4bN0 with clear margins  -will continue chemo for 3 more months before liver resection   Assessment & Plan Metastatic  colon cancer Undergoing chemotherapy with well-managed side effects. Hemoglobin levels are stable. Thrombocytopenia is present with a platelet count of 94, necessitating a reduced chemotherapy dose. - Continue current chemotherapy regimen with adjusted dose due to thrombocytopenia - Schedule follow-up appointment at [facility] after the second to last chemotherapy session  Chemotherapy-induced thrombocytopenia Platelet count of 94, lower than previous counts but acceptable for treatment continuation. Managed by adjusting chemotherapy dose. - Continue monitoring platelet counts - Adjust chemotherapy dose as needed to manage thrombocytopenia  Chemotherapy-induced peripheral neuropathy Peripheral neuropathy with tingling and numbness in toes, exacerbated by cold. Symptoms are well-managed with no significant impact on daily activities. - Monitor for any increase in numbness or tingling - Advise to report any worsening of symptoms  Chemotherapy-associated skin rash Persistent skin rash managed with hydrocortisone, remains well-managed with topical treatment. - Continue use of hydrocortisone for rash management  Medication management Requires refills for potassium, magnesium , and omeprazole . Claritin  is obtained over the counter. - Refill potassium, magnesium , and omeprazole  at [facility]  Plan - Clinically doing well, lab reviewed, adequate for treatment, will proceed to chemotherapy with dose reduction on 5-FU today due to thrombocytopenia - Follow-up in 2 weeks before next cycle chemo   SUMMARY OF ONCOLOGIC HISTORY: Oncology History  Adenocarcinoma of descending colon (HCC)  09/11/2023 Cancer Staging   Staging form: Colon and Rectum, AJCC 8th Edition - Clinical stage from 09/11/2023: Stage IVA (cTX, cN1, cM1a) - Signed by [name], MD on 10/12/2023   10/01/2023 Initial Diagnosis   Adenocarcinoma of descending colon (HCC)   10/24/2023 -  Chemotherapy   Patient is on Treatment Plan :  COLORECTAL FOLFOXIRI q14d      Genetic Testing   Negative CancerNext +RNAinsight.  The [facility] CancerNext+RNAinsight Panel includes sequencing, rearrangement analysis, and RNA analysis for the following 39 genes: APC, ATM, BAP1, BARD1, BMPR1A, BRCA1, BRCA2, BRIP1, CDH1, CDKN2A, CHEK2, FH, FLCN, MET, MLH1, MSH2, MSH6, MUTYH, NF1, NTHL1, PALB2, PMS2, PTEN, RAD51C, RAD51D, SMAD4, STK11, TP53, TSC1, TSC2, and VHL (sequencing and deletion/duplication); AXIN2, HOXB13, MBD4, MSH3, POLD1 and POLE (sequencing only); EPCAM and GREM1 (deletion/duplication only). Report date 11/04/23.       Discussed the use of AI scribe software for clinical note transcription with the patient, who gave verbal consent to proceed.  History of Present Illness Ashley [name] is a 38 year old female with metastatic colon cancer who presents for follow-up.  She is undergoing chemotherapy and has completed one of four remaining sessions, tolerating the last treatment well. Hemoglobin is stable, and platelet count is 94. She experiences tingling in her extremities, exacerbated by cold, without significant numbness or difficulty with fine motor tasks. Her rash is unchanged, and she uses hydrocortisone, though she missed a dose this morning. Current medications include magnesium , omeprazole , and potassium, with difficulty swallowing the potassium due to its size. She no longer takes oxycodone  and uses over-the-counter Claritin  for allergies.     All other systems were reviewed with the patient and are negative.  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   Irregular periods/menstrual cycles 01/30/2012   Late prenatal care 01/30/2012   Poor social situation 01/30/2012   Previous cesarean section complicating pregnancy, antepartum condition or complication  01/27/2016   2 previous cesarean section- Desires repeat with BTL   Single liveborn, born in hospital, delivered by cesarean delivery 08/13/2016   Status post repeat low transverse cesarean section 08/14/2016   Status post tubal ligation at time of delivery, current hosp 08/14/2016    SURGICAL HISTORY: Past Surgical History:  Procedure Laterality Date   CESAREAN SECTION  2009   CESAREAN SECTION  01/30/2012   Procedure: CESAREAN SECTION;  Surgeon: Ovid DELENA All, MD;  Location: [facility];  Service: Gynecology;  Laterality: N/A;  Repeat C/S   CESAREAN SECTION WITH BILATERAL TUBAL LIGATION Bilateral 08/13/2016   Procedure: CESAREAN SECTION WITH BILATERAL TUBAL LIGATION;  Surgeon: Burnard VEAR Pate, MD;  Location: [facility];  Service: Obstetrics;  Laterality: Bilateral;   CHOLECYSTECTOMY N/A 10/16/2013   Procedure: LAPAROSCOPIC CHOLECYSTECTOMY WITH INTRAOPERATIVE CHOLANGIOGRAM;  Surgeon: Donnice POUR. Belinda, MD;  Location: [facility] OR;  Service: General;  Laterality: N/A;   COLONOSCOPY WITH PROPOFOL  N/A 09/11/2023   Procedure: COLONOSCOPY WITH PROPOFOL ;  Surgeon: [name], MD;  Location: [facility];  Service: Gastroenterology;  Laterality: N/A;   DILATION AND EVACUATION N/A 07/28/2015   Procedure: SUCTION, DILATATION AND EVACUATION;  Surgeon: Carlin DELENA Centers, MD;  Location: [facility];  Service: Gynecology;  Laterality: N/A;   ESOPHAGOGASTRODUODENOSCOPY (EGD) WITH PROPOFOL  N/A 09/11/2023   Procedure: ESOPHAGOGASTRODUODENOSCOPY (EGD) WITH PROPOFOL ;  Surgeon: [name], MD;  Location: [facility];  Service: Gastroenterology;  Laterality: N/A;   IR IMAGING GUIDED PORT INSERTION  10/20/2023   IR US  LIVER BIOPSY  10/20/2023   SUBMUCOSAL INJECTION  09/11/2023   Procedure: INJECTION, SUBMUCOSAL;  Surgeon: [name], MD;  Location: [facility];  Service: Gastroenterology;;   TONSILLECTOMY  AGE 5   TONSILLECTOMY AND ADENOIDECTOMY  AGEB 18    I have reviewed the social  history and family history  with the patient and they are unchanged from previous note.  ALLERGIES:  is allergic to fruit extracts.  MEDICATIONS:  Current Outpatient Medications  Medication Sig Dispense Refill   doxycycline  (VIBRA -TABS) 100 MG tablet TAKE 1 TABLET BY MOUTH TWICE A DAY 60 tablet 1   Hydrocortisone Acetate (MEDI-CORTISONE EX) Apply 1 application  topically daily. Applied to face once daily     lidocaine -prilocaine  (EMLA ) cream Apply to affected area once (Patient taking differently: Apply 1 Application topically daily as needed (prior to port being accessed.).) 30 g 3   loratadine  (CLARITIN ) 10 MG tablet Take 1 tablet (10 mg total) by mouth daily. 90 tablet 0   magnesium  oxide (MAG-OX) 400 (240 Mg) MG tablet Take 1 tablet (400 mg total) by mouth 2 (two) times daily. 60 tablet 1   methocarbamol  (ROBAXIN ) 500 MG tablet Take 1 tablet (500 mg total) by mouth every 8 (eight) hours as needed for muscle spasms. 20 tablet 1   NON FORMULARY Take 1 Dose by mouth daily. [place]     omeprazole  (PRILOSEC  OTC) 20 MG tablet Take 1 tablet (20 mg total) by mouth daily. 28 tablet 1   OVER THE COUNTER MEDICATION Take 2 each by mouth in the morning. Soursop Garviola Gummies     Prenatal Vit-Fe Fumarate-FA (MULTIVITAMIN-PRENATAL) 27-0.8 MG TABS tablet Take 1 tablet by mouth in the morning.     potassium chloride  SA (KLOR-CON  M) 20 MEQ tablet Take 1 tablet (20 mEq total) by mouth daily. 30 tablet 1   No current facility-administered medications for this visit.   Facility-Administered Medications Ordered in Other Visits  Medication Dose Route Frequency Provider Last Rate Last Admin   dextrose  5 % solution   Intravenous Continuous [name], MD   Stopped at 04/29/24 1650   fluorouracil  (ADRUCIL ) 6,200 mg in sodium chloride  0.9 % 126 mL chemo infusion  6,200 mg Intravenous 1 day or 1 dose [name], MD   Infusion Verify at 04/29/24 1712    PHYSICAL EXAMINATION: ECOG PERFORMANCE STATUS: 1 - Symptomatic but completely  ambulatory  Vitals:   04/29/24 0921  BP: 120/78  Pulse: 96  Resp: 18  Temp: 98.2 F (36.8 C)  SpO2: 99%   Wt Readings from Last 3 Encounters:  04/29/24 250 lb 3.2 oz (113.5 kg)  04/15/24 248 lb 14.4 oz (112.9 kg)  04/01/24 247 lb (112 kg)     GENERAL:alert, no distress and comfortable SKIN: skin color, texture, turgor are normal, scattered acne-like rash on face and neck EYES: normal, Conjunctiva are pink and non-injected, sclera clear NECK: supple, thyroid  normal size, non-tender, without nodularity LYMPH:  no palpable lymphadenopathy in the cervical, axillary  LUNGS: clear to auscultation and percussion with normal breathing effort HEART: regular rate & rhythm and no murmurs and no lower extremity edema ABDOMEN:abdomen soft, non-tender and normal bowel sounds Musculoskeletal:no cyanosis of digits and no clubbing  NEURO: alert & oriented x 3 with fluent speech, no focal motor/sensory deficits  Physical Exam    LABORATORY DATA:  I have reviewed the data as listed    Latest Ref Rng & Units 04/29/2024    9:00 AM 04/15/2024    8:33 AM 04/01/2024    9:24 AM  CBC  WBC 4.0 - 10.5 K/uL 4.8  4.9  5.8   Hemoglobin 12.0 - 15.0 g/dL 89.3  89.3  89.2   Hematocrit 36.0 - 46.0 % 33.2  32.9  33.2   Platelets 150 - 400  K/uL 94  114  130         Latest Ref Rng & Units 04/29/2024    9:00 AM 04/15/2024    8:33 AM 04/01/2024    9:24 AM  CMP  Glucose 70 - 99 mg/dL 92  91  895   BUN 6 - 20 mg/dL 6  7  7    Creatinine 0.44 - 1.00 mg/dL 9.47  9.50  9.43   Sodium 135 - 145 mmol/L 140  141  140   Potassium 3.5 - 5.1 mmol/L 3.5  3.5  3.4   Chloride 98 - 111 mmol/L 106  107  105   CO2 22 - 32 mmol/L 29  28  31    Calcium  8.9 - 10.3 mg/dL 9.0  9.3  9.0   Total Protein 6.5 - 8.1 g/dL 7.1  7.1  7.0   Total Bilirubin 0.0 - 1.2 mg/dL 0.3  0.4  0.3   Alkaline Phos 38 - 126 U/L 118  102  109   AST 15 - 41 U/L 15  15  16    ALT 0 - 44 U/L 13  13  14        RADIOGRAPHIC STUDIES: I have  personally reviewed the radiological images as listed and agreed with the findings in the report. No results found.    No orders of the defined types were placed in this encounter.  All questions were answered. The patient knows to call the clinic with any problems, questions or concerns. No barriers to learning was detected. The total time spent in the appointment was 25 minutes, including review of chart and various tests results, discussions about plan of care and coordination of care plan     Onita Mattock, MD 04/29/2024

## 2024-04-29 NOTE — Progress Notes (Signed)
 5FU dose reduced from 3200 mg/m2 to 2800 mg/m2 by Dr. Lanny, but EPIC still rounding to same dose of 7000 mg  5FU total dose reduced to 6200 mg ( 2800 mg/m2) in treatment plan per verbal order from Dr. Lanny Maudie FORBES Darcie, PharmD, BCPS Clinical Pharmacist

## 2024-05-01 ENCOUNTER — Inpatient Hospital Stay

## 2024-05-01 VITALS — BP 120/76 | HR 83 | Temp 98.3°F | Resp 16

## 2024-05-01 DIAGNOSIS — Z5111 Encounter for antineoplastic chemotherapy: Secondary | ICD-10-CM | POA: Diagnosis not present

## 2024-05-01 DIAGNOSIS — C186 Malignant neoplasm of descending colon: Secondary | ICD-10-CM

## 2024-05-01 MED ORDER — PEGFILGRASTIM-CBQV 6 MG/0.6ML ~~LOC~~ SOSY
6.0000 mg | PREFILLED_SYRINGE | Freq: Once | SUBCUTANEOUS | Status: AC
  Administered 2024-05-01: 6 mg via SUBCUTANEOUS
  Filled 2024-05-01: qty 0.6

## 2024-05-01 MED ORDER — SODIUM CHLORIDE 0.9% FLUSH: 10.0000 mL | INTRAVENOUS | Status: DC | PRN

## 2024-05-06 ENCOUNTER — Encounter: Payer: Self-pay | Admitting: Radiology

## 2024-05-13 ENCOUNTER — Inpatient Hospital Stay: Admitting: Hematology

## 2024-05-13 ENCOUNTER — Inpatient Hospital Stay: Attending: Nurse Practitioner

## 2024-05-13 ENCOUNTER — Encounter: Payer: Self-pay | Admitting: Hematology

## 2024-05-13 ENCOUNTER — Inpatient Hospital Stay: Admitting: Nurse Practitioner

## 2024-05-13 ENCOUNTER — Inpatient Hospital Stay

## 2024-05-13 VITALS — BP 116/81 | HR 85 | Temp 98.7°F | Resp 18 | Wt 225.6 lb

## 2024-05-13 VITALS — Ht 64.0 in | Wt 256.0 lb

## 2024-05-13 DIAGNOSIS — Z5189 Encounter for other specified aftercare: Secondary | ICD-10-CM | POA: Diagnosis not present

## 2024-05-13 DIAGNOSIS — Z79630 Long term (current) use of alkylating agent: Secondary | ICD-10-CM | POA: Diagnosis not present

## 2024-05-13 DIAGNOSIS — C186 Malignant neoplasm of descending colon: Secondary | ICD-10-CM

## 2024-05-13 DIAGNOSIS — C787 Secondary malignant neoplasm of liver and intrahepatic bile duct: Secondary | ICD-10-CM | POA: Insufficient documentation

## 2024-05-13 DIAGNOSIS — Z79634 Long term (current) use of topoisomerase inhibitor: Secondary | ICD-10-CM | POA: Diagnosis not present

## 2024-05-13 DIAGNOSIS — Z5112 Encounter for antineoplastic immunotherapy: Secondary | ICD-10-CM | POA: Diagnosis present

## 2024-05-13 DIAGNOSIS — Z5111 Encounter for antineoplastic chemotherapy: Secondary | ICD-10-CM | POA: Diagnosis present

## 2024-05-13 DIAGNOSIS — Z79899 Other long term (current) drug therapy: Secondary | ICD-10-CM | POA: Diagnosis not present

## 2024-05-13 LAB — CBC WITH DIFFERENTIAL (CANCER CENTER ONLY)
Abs Immature Granulocytes: 0.03 K/uL (ref 0.00–0.07)
Basophils Absolute: 0 K/uL (ref 0.0–0.1)
Basophils Relative: 1 %
Eosinophils Absolute: 0.2 K/uL (ref 0.0–0.5)
Eosinophils Relative: 5 %
HCT: 32.3 % — ABNORMAL LOW (ref 36.0–46.0)
Hemoglobin: 10.1 g/dL — ABNORMAL LOW (ref 12.0–15.0)
Immature Granulocytes: 1 %
Lymphocytes Relative: 28 %
Lymphs Abs: 1 K/uL (ref 0.7–4.0)
MCH: 23.9 pg — ABNORMAL LOW (ref 26.0–34.0)
MCHC: 31.3 g/dL (ref 30.0–36.0)
MCV: 76.5 fL — ABNORMAL LOW (ref 80.0–100.0)
Monocytes Absolute: 0.6 K/uL (ref 0.1–1.0)
Monocytes Relative: 17 %
Neutro Abs: 1.9 K/uL (ref 1.7–7.7)
Neutrophils Relative %: 48 %
Platelet Count: 96 K/uL — ABNORMAL LOW (ref 150–400)
RBC: 4.22 MIL/uL (ref 3.87–5.11)
RDW: 16.5 % — ABNORMAL HIGH (ref 11.5–15.5)
WBC Count: 3.7 K/uL — ABNORMAL LOW (ref 4.0–10.5)
nRBC: 0 % (ref 0.0–0.2)

## 2024-05-13 LAB — CMP (CANCER CENTER ONLY)
ALT: 11 U/L (ref 0–44)
AST: 15 U/L (ref 15–41)
Albumin: 3.3 g/dL — ABNORMAL LOW (ref 3.5–5.0)
Alkaline Phosphatase: 112 U/L (ref 38–126)
Anion gap: 5 (ref 5–15)
BUN: 6 mg/dL (ref 6–20)
CO2: 28 mmol/L (ref 22–32)
Calcium: 8.7 mg/dL — ABNORMAL LOW (ref 8.9–10.3)
Chloride: 108 mmol/L (ref 98–111)
Creatinine: 0.49 mg/dL (ref 0.44–1.00)
GFR, Estimated: >60 mL/min (ref >60)
Glucose, Bld: 97 mg/dL (ref 70–99)
Potassium: 3.3 mmol/L — ABNORMAL LOW (ref 3.5–5.1)
Sodium: 141 mmol/L (ref 135–145)
Total Bilirubin: 0.3 mg/dL (ref 0.0–1.2)
Total Protein: 6.7 g/dL (ref 6.5–8.1)

## 2024-05-13 LAB — MAGNESIUM: Magnesium: 1.6 mg/dL — ABNORMAL LOW (ref 1.7–2.4)

## 2024-05-13 MED ORDER — MAGNESIUM SULFATE 2 GM/50ML IV SOLN
2.0000 g | Freq: Once | INTRAVENOUS | Status: AC
  Administered 2024-05-13: 2 g via INTRAVENOUS
  Filled 2024-05-13: qty 50

## 2024-05-13 MED ORDER — LEUCOVORIN CALCIUM INJECTION 350 MG
400.0000 mg/m2 | Freq: Once | INTRAVENOUS | Status: AC
  Administered 2024-05-13: 900 mg via INTRAVENOUS
  Filled 2024-05-13: qty 45

## 2024-05-13 MED ORDER — DIPHENHYDRAMINE HCL 25 MG PO CAPS
25.0000 mg | ORAL_CAPSULE | Freq: Once | ORAL | Status: AC
  Administered 2024-05-13: 25 mg via ORAL
  Filled 2024-05-13: qty 1

## 2024-05-13 MED ORDER — DEXTROSE 5 % IV SOLN: INTRAVENOUS | Status: DC

## 2024-05-13 MED ORDER — OXALIPLATIN CHEMO INJECTION 100 MG/20ML
85.0000 mg/m2 | Freq: Once | INTRAVENOUS | Status: AC
  Administered 2024-05-13: 200 mg via INTRAVENOUS
  Filled 2024-05-13: qty 40

## 2024-05-13 MED ORDER — FAMOTIDINE IN NACL 20-0.9 MG/50ML-% IV SOLN
20.0000 mg | Freq: Once | INTRAVENOUS | Status: AC
  Administered 2024-05-13: 20 mg via INTRAVENOUS
  Filled 2024-05-13: qty 50

## 2024-05-13 MED ORDER — SODIUM CHLORIDE 0.9 % IV SOLN
6200.0000 mg | INTRAVENOUS | Status: DC
  Administered 2024-05-13: 6200 mg via INTRAVENOUS
  Filled 2024-05-13: qty 124

## 2024-05-13 MED ORDER — APREPITANT 130 MG/18ML IV EMUL
130.0000 mg | Freq: Once | INTRAVENOUS | Status: AC
  Administered 2024-05-13: 130 mg via INTRAVENOUS
  Filled 2024-05-13: qty 18

## 2024-05-13 MED ORDER — SODIUM CHLORIDE 0.9 % IV SOLN
6.0000 mg/kg | Freq: Once | INTRAVENOUS | Status: AC
  Administered 2024-05-13: 700 mg via INTRAVENOUS
  Filled 2024-05-13: qty 15

## 2024-05-13 MED ORDER — PALONOSETRON HCL INJECTION 0.25 MG/5ML
0.2500 mg | Freq: Once | INTRAVENOUS | Status: AC
  Administered 2024-05-13: 0.25 mg via INTRAVENOUS
  Filled 2024-05-13: qty 5

## 2024-05-13 MED ORDER — SODIUM CHLORIDE 0.9 % IV SOLN
150.0000 mg/m2 | Freq: Once | INTRAVENOUS | Status: AC
  Administered 2024-05-13: 340 mg via INTRAVENOUS
  Filled 2024-05-13: qty 15

## 2024-05-13 MED ORDER — SODIUM CHLORIDE 0.9 % IV SOLN: Freq: Once | INTRAVENOUS | Status: AC

## 2024-05-13 MED ORDER — DEXAMETHASONE SOD PHOSPHATE PF 10 MG/ML IJ SOLN
10.0000 mg | Freq: Once | INTRAMUSCULAR | Status: AC
  Administered 2024-05-13: 10 mg via INTRAVENOUS

## 2024-05-13 NOTE — Progress Notes (Signed)
 [facility]   [phone] [phone]   Clinic Follow up Note   Patient Care Team: [name], MD as PCP - General (Family Medicine) [name], [name], RN as Oncology Nurse Navigator [name], MD as Consulting Physician (Gastroenterology) [name], MD as Consulting Physician (Medical Oncology) [name], MD as Consulting Physician (Obstetrics and Gynecology) [name], MD as Consulting Physician (General Surgery) [name], MD as Consulting Physician (General Surgery)  Date of Service:  05/13/2024  CHIEF COMPLAINT: f/u of metastatic colon cancer to liver  CURRENT THERAPY:  FOLFOXIRI and vectibix  every 2 weeks   Oncology History   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 -Colonoscopy showed malignant obstructive tumor in the distal descending colon, biopsy showed moderate differentiated invasive adenocarcinoma. -CT scan showed a 2.6 cm oligo liver metastasis.  Biopsy is pending -Patient was seen by colorectal surgeon Dr. Sheldon on October 10, 2023. -Would recommend neoadjuvant chemotherapy for 3 months, followed by surgery (left hemicolectomy, liver resection), or liver ablation or radiation if she respond well to chemo  -She started chemo FOLFOXIRI on 10/24/2023 -I discussed the NGS Tempus result and recommending Vectibix  to her chemo. She started from cycle 4 chemo  -restaging CT 6/24 showed slightly decreased colon and liver mets, no new lesions.  -Surgeon Dr. Dasie reviewed her case and recommend pt to be referred to [facility] for liver resection   - She underwent a hemicolectomy by Dr. Sheldon on February 16, 2024.  Surgical pathology showed T4bN0 with clear margins  -will continue chemo for 3 more months before liver resection   Assessment & Plan Metastatic  colon cancer on chemotherapy Undergoing chemotherapy with two cycles remaining. Experiences fatigue post-chemotherapy, managed with rest and naps. Plans to schedule follow-up appointment with [facility] after chemotherapy completion and will undergo a CT scan to assess treatment response. - Continue chemotherapy as scheduled - Will schedule follow-up appointment with [facility] after chemotherapy completion - Will plan for CT scan post-chemotherapy  chemo-induced skin rash Developed a mild rash on the face, likely related to Bactrebix. Rash is not severe and managed with daily moisturizing. Rash occasionally presents as pimple-like lesions. - Continue using moisturizer daily  Hypomagnesemia Magnesium  levels are slightly low. Currently taking oral magnesium  twice a day but has been inconsistent with dosing. Magnesium  supplementation is also provided via infusion. - Continue oral magnesium  twice a day for 3-5 days, then reduce to once a day - Ensure magnesium  supplementation via infusion  Hypokalemia Potassium levels are slightly low. Currently taking oral potassium once a day. - Continue oral potassium once a day  Plan - Labs reviewed, adequate for treatment, will proceed to chemo today - Follow-up in 2 weeks before next cycle chemo.   SUMMARY OF ONCOLOGIC HISTORY: Oncology History  Adenocarcinoma of descending colon (HCC)  09/11/2023 Cancer Staging   Staging form: Colon and Rectum, AJCC 8th Edition - Clinical stage from 09/11/2023: Stage IVA (cTX, cN1, cM1a) - Signed by [name], MD on 10/12/2023   10/01/2023 Initial Diagnosis   Adenocarcinoma of descending colon (HCC)   10/24/2023 -  Chemotherapy   Patient is on Treatment Plan : COLORECTAL FOLFOXIRI q14d      Genetic Testing   Negative CancerNext +RNAinsight. The [employer] CancerNext+RNAinsight Panel includes sequencing, rearrangement analysis, and RNA analysis for the following 39 genes: APC, ATM, BAP1, BARD1,  BMPR1A, BRCA1, BRCA2, BRIP1, CDH1,  CDKN2A, CHEK2, FH, FLCN, MET, MLH1, MSH2, MSH6, MUTYH, NF1, NTHL1, PALB2, PMS2, PTEN, RAD51C, RAD51D, SMAD4, STK11, TP53, TSC1, TSC2, and VHL (sequencing and deletion/duplication); AXIN2, HOXB13, MBD4, MSH3, POLD1 and POLE (sequencing only); EPCAM and GREM1 (deletion/duplication only). Report date 11/04/23.       Discussed the use of AI scribe software for clinical note transcription with the patient, who gave verbal consent to proceed.  History of Present Illness Ashley [name] is a 38 year old female with metastatic colon cancer who presents for a follow-up visit.  Fatigue significantly impacts her daily activities, particularly after chemotherapy sessions. She rests for 20-30 minutes to aid recovery and avoids driving on chemotherapy days due to drowsiness from Benadryl , which she takes during treatment.  She experiences dry skin and a facial rash, likely related to her medication regimen. She is on doxycycline  but occasionally misses doses. She applies moisturizer daily to manage these symptoms.  She has low magnesium  and potassium levels, for which she takes oral supplements. She occasionally forgets her magnesium  doses but plans to resume taking it twice daily. Potassium is taken once daily.  Fingertip discomfort occurs, especially in colder weather, but she denies any difficulty with gripping or picking up objects. Her hand strength is slightly reduced but manageable.     All other systems were reviewed with the patient and are negative.  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   Irregular periods/menstrual cycles 01/30/2012   Late prenatal care 01/30/2012   Poor social situation 01/30/2012   Previous cesarean section complicating pregnancy, antepartum condition or complication 01/27/2016   2 previous  cesarean section- Desires repeat with BTL   Single liveborn, born in hospital, delivered by cesarean delivery 08/13/2016   Status post repeat low transverse cesarean section 08/14/2016   Status post tubal ligation at time of delivery, current hosp 08/14/2016    SURGICAL HISTORY: Past Surgical History:  Procedure Laterality Date   CESAREAN SECTION  2009   CESAREAN SECTION  01/30/2012   Procedure: CESAREAN SECTION;  Surgeon: Ovid DELENA All, MD;  Location: [facility];  Service: Gynecology;  Laterality: N/A;  Repeat C/S   CESAREAN SECTION WITH BILATERAL TUBAL LIGATION Bilateral 08/13/2016   Procedure: CESAREAN SECTION WITH BILATERAL TUBAL LIGATION;  Surgeon: Burnard VEAR Pate, MD;  Location: [facility];  Service: Obstetrics;  Laterality: Bilateral;   CHOLECYSTECTOMY N/A 10/16/2013   Procedure: LAPAROSCOPIC CHOLECYSTECTOMY WITH INTRAOPERATIVE CHOLANGIOGRAM;  Surgeon: Donnice POUR. Belinda, MD;  Location: [facility] OR;  Service: General;  Laterality: N/A;   COLONOSCOPY WITH PROPOFOL  N/A 09/11/2023   Procedure: COLONOSCOPY WITH PROPOFOL ;  Surgeon: [name], MD;  Location: [facility];  Service: Gastroenterology;  Laterality: N/A;   DILATION AND EVACUATION N/A 07/28/2015   Procedure: SUCTION, DILATATION AND EVACUATION;  Surgeon: Carlin DELENA Centers, MD;  Location: [facility];  Service: Gynecology;  Laterality: N/A;   ESOPHAGOGASTRODUODENOSCOPY (EGD) WITH PROPOFOL  N/A 09/11/2023   Procedure: ESOPHAGOGASTRODUODENOSCOPY (EGD) WITH PROPOFOL ;  Surgeon: [name], MD;  Location: [facility];  Service: Gastroenterology;  Laterality: N/A;   IR IMAGING GUIDED PORT INSERTION  10/20/2023   IR US  LIVER BIOPSY  10/20/2023   SUBMUCOSAL INJECTION  09/11/2023   Procedure: INJECTION, SUBMUCOSAL;  Surgeon: [name], MD;  Location: [facility];  Service:  Gastroenterology;;   TONSILLECTOMY  AGE 63   TONSILLECTOMY AND ADENOIDECTOMY  AGEB 18    I have reviewed the social history and family history with the patient and they  are unchanged from previous note.  ALLERGIES:  is allergic to fruit extracts.  MEDICATIONS:  Current Outpatient Medications  Medication Sig Dispense Refill   doxycycline  (VIBRA -TABS) 100 MG tablet TAKE 1 TABLET BY MOUTH TWICE A DAY 60 tablet 1   Hydrocortisone Acetate (MEDI-CORTISONE EX) Apply 1 application  topically daily. Applied to face once daily     lidocaine -prilocaine  (EMLA ) cream Apply to affected area once (Patient taking differently: Apply 1 Application topically daily as needed (prior to port being accessed.).) 30 g 3   loratadine  (CLARITIN ) 10 MG tablet Take 1 tablet (10 mg total) by mouth daily. 90 tablet 0   magnesium  oxide (MAG-OX) 400 (240 Mg) MG tablet Take 1 tablet (400 mg total) by mouth 2 (two) times daily. 60 tablet 1   methocarbamol  (ROBAXIN ) 500 MG tablet Take 1 tablet (500 mg total) by mouth every 8 (eight) hours as needed for muscle spasms. 20 tablet 1   NON FORMULARY Take 1 Dose by mouth daily. [place]     omeprazole  (PRILOSEC  OTC) 20 MG tablet Take 1 tablet (20 mg total) by mouth daily. 28 tablet 1   OVER THE COUNTER MEDICATION Take 2 each by mouth in the morning. Soursop Garviola Gummies     potassium chloride  SA (KLOR-CON  M) 20 MEQ tablet Take 1 tablet (20 mEq total) by mouth daily. 30 tablet 1   Prenatal Vit-Fe Fumarate-FA (MULTIVITAMIN-PRENATAL) 27-0.8 MG TABS tablet Take 1 tablet by mouth in the morning.     No current facility-administered medications for this visit.   Facility-Administered Medications Ordered in Other Visits  Medication Dose Route Frequency Provider Last Rate Last Admin   dextrose  5 % solution   Intravenous Continuous [name], MD       famotidine  (PEPCID ) IVPB 20 mg premix  20 mg Intravenous Once [name], MD 200 mL/hr at 05/13/24 1057 20 mg at 05/13/24 1057   fluorouracil  (ADRUCIL ) 6,200 mg in sodium chloride  0.9 % 126 mL chemo infusion  6,200 mg Intravenous 1 day or 1 dose [name], MD       irinotecan  (CAMPTOSAR ) 340 mg in sodium  chloride 0.9 % 500 mL chemo infusion  150 mg/m2 (Treatment Plan Recorded) Intravenous Once [name], MD       leucovorin  900 mg in dextrose  5 % 250 mL infusion  400 mg/m2 (Treatment Plan Recorded) Intravenous Once [name], MD       magnesium  sulfate IVPB 2 g 50 mL  2 g Intravenous Once [name], MD       oxaliplatin  (ELOXATIN ) 200 mg in dextrose  5 % 500 mL chemo infusion  85 mg/m2 (Treatment Plan Recorded) Intravenous Once [name], MD       panitumumab  (VECTIBIX ) 700 mg in sodium chloride  0.9 % 100 mL chemo infusion  6 mg/kg (Treatment Plan Recorded) Intravenous Once [name], MD        PHYSICAL EXAMINATION: ECOG PERFORMANCE STATUS: 1 - Symptomatic but completely ambulatory  Vitals:   05/13/24 0945  BP: 116/81  Pulse: 85  Resp: 18  Temp: 98.7 F (37.1 C)  SpO2: 96%   Wt Readings from Last 3 Encounters:  05/13/24 256 lb (116.1 kg)  05/13/24 225 lb 9.6 oz (102.3 kg)  04/29/24 250 lb 3.2 oz (113.5 kg)     GENERAL:alert,  no distress and comfortable SKIN: skin color, texture, turgor are normal, diffuse acne-like rash on her face and neck EYES: normal, Conjunctiva are pink and non-injected, sclera clear NECK: supple, thyroid  normal size, non-tender, without nodularity LYMPH:  no palpable lymphadenopathy in the cervical, axillary  LUNGS: clear to auscultation and percussion with normal breathing effort HEART: regular rate & rhythm and no murmurs and no lower extremity edema ABDOMEN:abdomen soft, non-tender and normal bowel sounds Musculoskeletal:no cyanosis of digits and no clubbing  NEURO: alert & oriented x 3 with fluent speech, no focal motor/sensory deficits  Physical Exam    LABORATORY DATA:  I have reviewed the data as listed    Latest Ref Rng & Units 05/13/2024    9:02 AM 04/29/2024    9:00 AM 04/15/2024    8:33 AM  CBC  WBC 4.0 - 10.5 K/uL 3.7  4.8  4.9   Hemoglobin 12.0 - 15.0 g/dL 89.8  89.3  89.3   Hematocrit 36.0 - 46.0 % 32.3  33.2  32.9   Platelets 150  - 400 K/uL 96  94  114         Latest Ref Rng & Units 05/13/2024    9:02 AM 04/29/2024    9:00 AM 04/15/2024    8:33 AM  CMP  Glucose 70 - 99 mg/dL 97  92  91   BUN 6 - 20 mg/dL 6  6  7    Creatinine 0.44 - 1.00 mg/dL 9.50  9.47  9.50   Sodium 135 - 145 mmol/L 141  140  141   Potassium 3.5 - 5.1 mmol/L 3.3  3.5  3.5   Chloride 98 - 111 mmol/L 108  106  107   CO2 22 - 32 mmol/L 28  29  28    Calcium  8.9 - 10.3 mg/dL 8.7  9.0  9.3   Total Protein 6.5 - 8.1 g/dL 6.7  7.1  7.1   Total Bilirubin 0.0 - 1.2 mg/dL 0.3  0.3  0.4   Alkaline Phos 38 - 126 U/L 112  118  102   AST 15 - 41 U/L 15  15  15    ALT 0 - 44 U/L 11  13  13        RADIOGRAPHIC STUDIES: I have personally reviewed the radiological images as listed and agreed with the findings in the report. No results found.    No orders of the defined types were placed in this encounter.  All questions were answered. The patient knows to call the clinic with any problems, questions or concerns. No barriers to learning was detected. The total time spent in the appointment was 25 minutes, including review of chart and various tests results, discussions about plan of care and coordination of care plan     Onita Mattock, MD 05/13/2024

## 2024-05-13 NOTE — Patient Instructions (Signed)
 [facility] - A DEPT OF [facility]  Discharge Instructions: Thank you for choosing [facility] to provide your oncology and hematology care.   If you have a lab appointment with the Cancer Center, please go directly to the Cancer Center and check in at the registration area.   Wear comfortable clothing and clothing appropriate for easy access to any Portacath or PICC line.   We strive to give you quality time with your provider. You may need to reschedule your appointment if you arrive late (15 or more minutes).  Arriving late affects you and other patients whose appointments are after yours.  Also, if you miss three or more appointments without notifying the office, you may be dismissed from the clinic at the provider's discretion.      For prescription refill requests, have your pharmacy contact our office and allow 72 hours for refills to be completed.    Today you received the following chemotherapy and/or immunotherapy agents vectibix , irinotecan , oxaliplatin , leucovorin , adrucil       To help prevent nausea and vomiting after your treatment, we encourage you to take your nausea medication as directed.  BELOW ARE SYMPTOMS THAT SHOULD BE REPORTED IMMEDIATELY: *FEVER GREATER THAN 100.4 F (38 C) OR HIGHER *CHILLS OR SWEATING *NAUSEA AND VOMITING THAT IS NOT CONTROLLED WITH YOUR NAUSEA MEDICATION *UNUSUAL SHORTNESS OF BREATH *UNUSUAL BRUISING OR BLEEDING *URINARY PROBLEMS (pain or burning when urinating, or frequent urination) *BOWEL PROBLEMS (unusual diarrhea, constipation, pain near the anus) TENDERNESS IN MOUTH AND THROAT WITH OR WITHOUT PRESENCE OF ULCERS (sore throat, sores in mouth, or a toothache) UNUSUAL RASH, SWELLING OR PAIN  UNUSUAL VAGINAL DISCHARGE OR ITCHING   Items with * indicate a potential emergency and should be followed up as soon as possible or go to the Emergency Department if any problems should occur.  Please show  the CHEMOTHERAPY ALERT CARD or IMMUNOTHERAPY ALERT CARD at check-in to the Emergency Department and triage nurse.  Should you have questions after your visit or need to cancel or reschedule your appointment, please contact [facility] - A DEPT [name]  Dept: [phone]  and follow the prompts.  Office hours are 8:00 a.m. to 4:30 p.m. Monday - Friday. Please note that voicemails left after 4:00 p.m. may not be returned until the following business day.  We are closed weekends and major holidays. You have access to a nurse at all times for urgent questions. Please call the main number to the clinic Dept: [phone] and follow the prompts.   For any non-urgent questions, you may also contact your provider using MyChart. We now offer e-Visits for anyone 46 and older to request care online for non-urgent symptoms. For details visit [email or web address].   Also download the MyChart app! Go to the app store, search MyChart, open the app, select , and log in with your MyChart username and password.  The chemotherapy medication bag should finish at 46 hours, 96 hours, or 7 days. For example, if your pump is scheduled for 46 hours and it was put on at 4:00 p.m., it should finish at 2:00 p.m. the day it is scheduled to come off regardless of your appointment time.     Estimated time to finish at: 4pm 11/13     If the display on your pump reads Low Volume and it is beeping, take the batteries out of the pump and come  to the cancer center for it to be taken off.   If the pump alarms go off prior to the pump reading Low Volume then call [phone] and someone can assist you.  If the plunger comes out and the chemotherapy medication is leaking out, please use your home chemo spill kit to clean up the spill. Do NOT use paper towels or other household products.  If you have problems or questions regarding your pump, please call either  [phone] (24 hours a day) or the cancer center Monday-Friday 8:00 a.m.- 4:30 p.m. at the clinic number and we will assist you. If you are unable to get assistance, then go to the nearest Emergency Department and ask the staff to contact the IV team for assistance.   Hypomagnesemia Hypomagnesemia is a condition in which the level of magnesium  in the blood is too low. Magnesium  is a mineral that is found in many foods. It is used in many different processes in the body. Hypomagnesemia can affect every organ in the body. In severe cases, it can cause life-threatening problems. What are the causes? This condition may be caused by: Not getting enough magnesium  in your diet or not having enough healthy foods to eat (malnutrition). Problems with magnesium  absorption in the intestines. Dehydration. Excessive use of alcohol. Vomiting. Severe or long-term (chronic) diarrhea. Some medicines, including medicines that make you urinate more often (diuretics). Certain diseases, such as kidney disease, diabetes, celiac disease, and overactive thyroid . What are the signs or symptoms? Symptoms of this condition include: Loss of appetite, nausea, and vomiting. Involuntary shaking or trembling of a body part (tremor). Muscle weakness or tingling in the arms and legs. Sudden tightening of muscles (muscle spasms). Confusion. Psychiatric issues, such as: Depression and irritability. Psychosis. A feeling of fluttering of the heart (palpitations). Seizures. These symptoms are more severe if magnesium  levels drop suddenly. How is this diagnosed? This condition may be diagnosed based on: Your symptoms and medical history. A physical exam. Blood and urine tests. How is this treated? Treatment depends on the cause and the severity of the condition. It may be treated by: Taking a magnesium  supplement. This can be taken in pill form. If the condition is severe, magnesium  is usually given through an  IV. Making changes to your diet. You may be directed to eat foods that have a lot of magnesium , such as green leafy vegetables, peas, beans, and nuts. Not drinking alcohol. If you are struggling not to drink, ask your health care provider for help. Follow these instructions at home: Eating and drinking     Make sure that your diet includes foods with magnesium . Foods that have a lot of magnesium  in them include: Green leafy vegetables, such as spinach and broccoli. Beans and peas. Nuts and seeds, such as almonds and sunflower seeds. Whole grains, such as whole grain bread and fortified cereals. Drink fluids that contain salts and minerals (electrolytes), such as sports drinks, when you are active. Do not drink alcohol. General instructions Take over-the-counter and prescription medicines only as told by your health care provider. Take magnesium  supplements as directed if your health care provider tells you to take them. Have your magnesium  levels monitored as told by your health care provider. Keep all follow-up visits. This is important. Contact a health care provider if: You get worse instead of better. Your symptoms return. Get help right away if: You develop severe muscle weakness. You have trouble breathing. You feel that your heart is racing. These symptoms may  represent a serious problem that is an emergency. Do not wait to see if the symptoms will go away. Get medical help right away. Call your local emergency services (911 in the [place] Do not drive yourself to the hospital. Summary Hypomagnesemia is a condition in which the level of magnesium  in the blood is too low. Hypomagnesemia can affect every organ in the body. Treatment may include eating more foods that contain magnesium , taking magnesium  supplements, and not drinking alcohol. Have your magnesium  levels monitored as told by your health care provider. This information is not intended to replace advice given to you by  your health care provider. Make sure you discuss any questions you have with your health care provider. Document Revised: 11/17/2020 Document Reviewed: 11/17/2020 Elsevier Patient Education  2024 [employer] Hypokalemia Hypokalemia means that the amount of potassium in the blood is lower than normal. Potassium is a mineral (electrolyte) that helps regulate the amount of fluid in the body. It also stimulates muscle tightening (contraction) and helps nerves work properly. Normally, most of the body's potassium is inside cells, and only a very small amount is in the blood. Because the amount in the blood is so small, minor changes to potassium levels in the blood can be life-threatening. What are the causes? This condition may be caused by: Antibiotic medicine. Diarrhea or vomiting. Taking too much of a medicine that helps you have a bowel movement (laxative) can cause diarrhea and lead to hypokalemia. Chronic kidney disease (CKD). Medicines that help the body get rid of excess fluid (diuretics). Eating disorders, such as anorexia or bulimia. Low magnesium  levels in the body. Sweating a lot. What are the signs or symptoms? Symptoms of this condition include: Weakness. Constipation. Fatigue. Muscle cramps. Mental confusion. Skipped heartbeats or irregular heartbeat (palpitations). Tingling or numbness. How is this diagnosed? This condition is diagnosed with a blood test. How is this treated? This condition may be treated by: Taking potassium supplements. Adjusting the medicines that you take. Eating more foods that contain a lot of potassium. If your potassium level is very low, you may need to get potassium through an IV and be monitored in the hospital. Follow these instructions at home: Eating and drinking  Eat a healthy diet. A healthy diet includes fresh fruits and vegetables, whole grains, healthy fats, and lean proteins. If told, eat more foods that contain a lot of  potassium. These include: Nuts, such as peanuts and pistachios. Seeds, such as sunflower seeds and pumpkin seeds. Peas, lentils, and lima beans. Whole grain and bran cereals and breads. Fresh fruits and vegetables, such as apricots, avocado, bananas, cantaloupe, kiwi, oranges, tomatoes, asparagus, and potatoes. Juices, such as orange, tomato, and prune. Lean meats, including fish. Milk and milk products, such as yogurt. General instructions Take over-the-counter and prescription medicines only as told by your health care provider. This includes vitamins, natural food products, and supplements. Keep all follow-up visits. This is important. Contact a health care provider if: You have weakness that gets worse. You feel your heart pounding or racing. You vomit. You have diarrhea. You have diabetes and you have trouble keeping your blood sugar in your target range. Get help right away if: You have chest pain. You have shortness of breath. You have vomiting or diarrhea that lasts for more than 2 days. You faint. These symptoms may be an emergency. Get help right away. Call 911. Do not wait to see if the symptoms will go away. Do not drive yourself to the  hospital. Summary Hypokalemia means that the amount of potassium in the blood is lower than normal. This condition is diagnosed with a blood test. Hypokalemia may be treated by taking potassium supplements, adjusting the medicines that you take, or eating more foods that are high in potassium. If your potassium level is very low, you may need to get potassium through an IV and be monitored in the hospital. This information is not intended to replace advice given to you by your health care provider. Make sure you discuss any questions you have with your health care provider. Document Revised: 03/04/2021 Document Reviewed: 03/04/2021 Elsevier Patient Education  2024 [employer].

## 2024-05-13 NOTE — Assessment & Plan Note (Signed)
-  Stage IV with oligometastasis, MSS, HER2 (+), UGT1A1 (+), KRAS/NRAS/BRAF wild type -Presented with abdominal pain, change of bowel habits, and a 50 pound weight loss. -Colonoscopy showed malignant obstructive tumor in the distal descending colon, biopsy showed moderate differentiated invasive adenocarcinoma. -CT scan showed a 2.6 cm oligo liver metastasis.  Biopsy is pending -Patient was seen by colorectal surgeon Dr. Sheldon on October 10, 2023. -Would recommend neoadjuvant chemotherapy for 3 months, followed by surgery (left hemicolectomy, liver resection), or liver ablation or radiation if she respond well to chemo  -She started chemo FOLFOXIRI on 10/24/2023 -I discussed the NGS Tempus result and recommending Vectibix  to her chemo. She started from cycle 4 chemo  -restaging CT 6/24 showed slightly decreased colon and liver mets, no new lesions.  -Surgeon Dr. Dasie reviewed her case and recommend pt to be referred to [facility] for liver resection   - She underwent a hemicolectomy by Dr. Sheldon on February 16, 2024.  Surgical pathology showed T4bN0 with clear margins  -will continue chemo for 3 more months before liver resection

## 2024-05-14 ENCOUNTER — Telehealth: Payer: Self-pay

## 2024-05-14 NOTE — Telephone Encounter (Signed)
 Palliative referral closed at this time per oncologist and pt declining services at this time. Palliative services available upon re-consult

## 2024-05-15 ENCOUNTER — Inpatient Hospital Stay

## 2024-05-15 VITALS — BP 119/87 | HR 87 | Temp 97.9°F

## 2024-05-15 DIAGNOSIS — C186 Malignant neoplasm of descending colon: Secondary | ICD-10-CM

## 2024-05-15 MED ORDER — PEGFILGRASTIM-CBQV 6 MG/0.6ML ~~LOC~~ SOSY: 6.0000 mg | PREFILLED_SYRINGE | Freq: Once | SUBCUTANEOUS | Status: DC

## 2024-05-15 MED ORDER — SODIUM CHLORIDE 0.9% FLUSH
10.0000 mL | INTRAVENOUS | Status: DC | PRN
  Administered 2024-05-15: 10 mL

## 2024-05-26 NOTE — Assessment & Plan Note (Signed)
-  Stage IV with oligometastasis, MSS, HER2 (+), UGT1A1 (+), KRAS/NRAS/BRAF wild type -Presented with abdominal pain, change of bowel habits, and a 50 pound weight loss. -Colonoscopy showed malignant obstructive tumor in the distal descending colon, biopsy showed moderate differentiated invasive adenocarcinoma. -CT scan showed a 2.6 cm oligo liver metastasis.  Biopsy is pending -Patient was seen by colorectal surgeon Dr. Sheldon on October 10, 2023. -Would recommend neoadjuvant chemotherapy for 3 months, followed by surgery (left hemicolectomy, liver resection), or liver ablation or radiation if she respond well to chemo  -She started chemo FOLFOXIRI on 10/24/2023 -I discussed the NGS Tempus result and recommending Vectibix  to her chemo. She started from cycle 4 chemo  -restaging CT 6/24 showed slightly decreased colon and liver mets, no new lesions.  -Surgeon Dr. Dasie reviewed her case and recommend pt to be referred to [facility] for liver resection   - She underwent a hemicolectomy by Dr. Sheldon on February 16, 2024.  Surgical pathology showed T4bN0 with clear margins  -will continue chemo for 3 more months before liver resection

## 2024-05-27 ENCOUNTER — Inpatient Hospital Stay

## 2024-05-27 ENCOUNTER — Inpatient Hospital Stay: Admitting: Hematology

## 2024-05-27 VITALS — BP 118/73 | HR 98 | Temp 98.4°F | Resp 17 | Ht 64.0 in | Wt 253.0 lb

## 2024-05-27 VITALS — BP 134/82 | HR 87 | Temp 98.0°F | Resp 20

## 2024-05-27 DIAGNOSIS — C186 Malignant neoplasm of descending colon: Secondary | ICD-10-CM

## 2024-05-27 DIAGNOSIS — Z5111 Encounter for antineoplastic chemotherapy: Secondary | ICD-10-CM | POA: Diagnosis not present

## 2024-05-27 LAB — CBC WITH DIFFERENTIAL (CANCER CENTER ONLY)
Abs Immature Granulocytes: 0.37 K/uL — ABNORMAL HIGH (ref 0.00–0.07)
Basophils Absolute: 0.1 K/uL (ref 0.0–0.1)
Basophils Relative: 1 %
Eosinophils Absolute: 0.2 K/uL (ref 0.0–0.5)
Eosinophils Relative: 4 %
HCT: 36.6 % (ref 36.0–46.0)
Hemoglobin: 11.6 g/dL — ABNORMAL LOW (ref 12.0–15.0)
Immature Granulocytes: 6 %
Lymphocytes Relative: 22 %
Lymphs Abs: 1.3 K/uL (ref 0.7–4.0)
MCH: 24.2 pg — ABNORMAL LOW (ref 26.0–34.0)
MCHC: 31.7 g/dL (ref 30.0–36.0)
MCV: 76.3 fL — ABNORMAL LOW (ref 80.0–100.0)
Monocytes Absolute: 0.6 K/uL (ref 0.1–1.0)
Monocytes Relative: 11 %
Neutro Abs: 3.2 K/uL (ref 1.7–7.7)
Neutrophils Relative %: 56 %
Platelet Count: 113 K/uL — ABNORMAL LOW (ref 150–400)
RBC: 4.8 MIL/uL (ref 3.87–5.11)
RDW: 17.4 % — ABNORMAL HIGH (ref 11.5–15.5)
WBC Count: 5.7 K/uL (ref 4.0–10.5)
nRBC: 0 % (ref 0.0–0.2)

## 2024-05-27 LAB — CMP (CANCER CENTER ONLY)
ALT: 16 U/L (ref 0–44)
AST: 24 U/L (ref 15–41)
Albumin: 3.8 g/dL (ref 3.5–5.0)
Alkaline Phosphatase: 142 U/L — ABNORMAL HIGH (ref 38–126)
Anion gap: 10 (ref 5–15)
BUN: <5 mg/dL — ABNORMAL LOW (ref 6–20)
CO2: 26 mmol/L (ref 22–32)
Calcium: 9.4 mg/dL (ref 8.9–10.3)
Chloride: 104 mmol/L (ref 98–111)
Creatinine: 0.54 mg/dL (ref 0.44–1.00)
GFR, Estimated: >60 mL/min (ref >60)
Glucose, Bld: 97 mg/dL (ref 70–99)
Potassium: 3.5 mmol/L (ref 3.5–5.1)
Sodium: 140 mmol/L (ref 135–145)
Total Bilirubin: 0.4 mg/dL (ref 0.0–1.2)
Total Protein: 7.7 g/dL (ref 6.5–8.1)

## 2024-05-27 LAB — PREGNANCY, URINE: Preg Test, Ur: NEGATIVE

## 2024-05-27 LAB — MAGNESIUM: Magnesium: 1.7 mg/dL (ref 1.7–2.4)

## 2024-05-27 MED ORDER — SODIUM CHLORIDE 0.9 % IV SOLN
6200.0000 mg | INTRAVENOUS | Status: DC
  Administered 2024-05-27: 6200 mg via INTRAVENOUS
  Filled 2024-05-27: qty 124

## 2024-05-27 MED ORDER — DIPHENHYDRAMINE HCL 25 MG PO CAPS
25.0000 mg | ORAL_CAPSULE | Freq: Once | ORAL | Status: AC
  Administered 2024-05-27: 25 mg via ORAL
  Filled 2024-05-27: qty 1

## 2024-05-27 MED ORDER — SODIUM CHLORIDE 0.9% FLUSH: 10.0000 mL | INTRAVENOUS | Status: DC | PRN

## 2024-05-27 MED ORDER — LEUCOVORIN CALCIUM INJECTION 350 MG
400.0000 mg/m2 | Freq: Once | INTRAVENOUS | Status: AC
  Administered 2024-05-27: 900 mg via INTRAVENOUS
  Filled 2024-05-27: qty 45

## 2024-05-27 MED ORDER — SODIUM CHLORIDE 0.9 % IV SOLN
150.0000 mg/m2 | Freq: Once | INTRAVENOUS | Status: AC
  Administered 2024-05-27: 340 mg via INTRAVENOUS
  Filled 2024-05-27: qty 15

## 2024-05-27 MED ORDER — FAMOTIDINE IN NACL 20-0.9 MG/50ML-% IV SOLN
20.0000 mg | Freq: Once | INTRAVENOUS | Status: AC
  Administered 2024-05-27: 20 mg via INTRAVENOUS
  Filled 2024-05-27: qty 50

## 2024-05-27 MED ORDER — ATROPINE SULFATE 1 MG/ML IV SOLN
0.5000 mg | Freq: Once | INTRAVENOUS | Status: AC | PRN
  Administered 2024-05-27: 0.5 mg via INTRAVENOUS
  Filled 2024-05-27: qty 1

## 2024-05-27 MED ORDER — SODIUM CHLORIDE 0.9 % IV SOLN
6.0000 mg/kg | Freq: Once | INTRAVENOUS | Status: AC
  Administered 2024-05-27: 700 mg via INTRAVENOUS
  Filled 2024-05-27: qty 15

## 2024-05-27 MED ORDER — APREPITANT 130 MG/18ML IV EMUL
130.0000 mg | Freq: Once | INTRAVENOUS | Status: AC
  Administered 2024-05-27: 130 mg via INTRAVENOUS
  Filled 2024-05-27: qty 18

## 2024-05-27 MED ORDER — DEXTROSE 5 % IV SOLN: INTRAVENOUS | Status: DC

## 2024-05-27 MED ORDER — OXALIPLATIN CHEMO INJECTION 100 MG/20ML
85.0000 mg/m2 | Freq: Once | INTRAVENOUS | Status: AC
  Administered 2024-05-27: 200 mg via INTRAVENOUS
  Filled 2024-05-27: qty 40

## 2024-05-27 MED ORDER — DEXAMETHASONE SOD PHOSPHATE PF 10 MG/ML IJ SOLN
10.0000 mg | Freq: Once | INTRAMUSCULAR | Status: AC
  Administered 2024-05-27: 10 mg via INTRAVENOUS

## 2024-05-27 MED ORDER — MAGNESIUM OXIDE -MG SUPPLEMENT 400 (240 MG) MG PO TABS: 400.0000 mg | ORAL_TABLET | Freq: Two times a day (BID) | ORAL | 1 refills | Status: AC

## 2024-05-27 MED ORDER — MAGNESIUM SULFATE 2 GM/50ML IV SOLN
2.0000 g | Freq: Once | INTRAVENOUS | Status: AC
  Administered 2024-05-27: 2 g via INTRAVENOUS
  Filled 2024-05-27: qty 50

## 2024-05-27 MED ORDER — PALONOSETRON HCL INJECTION 0.25 MG/5ML
0.2500 mg | Freq: Once | INTRAVENOUS | Status: AC
  Administered 2024-05-27: 0.25 mg via INTRAVENOUS
  Filled 2024-05-27: qty 5

## 2024-05-27 MED ORDER — SODIUM CHLORIDE 0.9 % IV SOLN: Freq: Once | INTRAVENOUS | Status: AC

## 2024-05-27 NOTE — Progress Notes (Signed)
 [facility]   [phone] [phone]   Clinic Follow up Note   Patient Care Team: [name], MD as PCP - General (Family Medicine) [name], [name], RN as Oncology Nurse Navigator [name], MD as Consulting Physician (Gastroenterology) [name], MD as Consulting Physician (Medical Oncology) [name], MD as Consulting Physician (Obstetrics and Gynecology) [name], MD as Consulting Physician (General Surgery) [name], MD as Consulting Physician (General Surgery)  Date of Service:  05/27/2024  CHIEF COMPLAINT: f/u of metastatic colon cancer   CURRENT THERAPY:  neoadjuvant chemotherapy FOLFOXIRI every 2 weeks   Oncology History   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 -Colonoscopy showed malignant obstructive tumor in the distal descending colon, biopsy showed moderate differentiated invasive adenocarcinoma. -CT scan showed a 2.6 cm oligo liver metastasis.  Biopsy is pending -Patient was seen by colorectal surgeon Dr. Sheldon on October 10, 2023. -Would recommend neoadjuvant chemotherapy for 3 months, followed by surgery (left hemicolectomy, liver resection), or liver ablation or radiation if she respond well to chemo  -She started chemo FOLFOXIRI on 10/24/2023 -I discussed the NGS Tempus result and recommending Vectibix  to her chemo. She started from cycle 4 chemo  -restaging CT 6/24 showed slightly decreased colon and liver mets, no new lesions.  -Surgeon Dr. Dasie reviewed her case and recommend pt to be referred to [facility] for liver resection   - She underwent a hemicolectomy by Dr. Sheldon on February 16, 2024.  Surgical pathology showed [id number] with clear margins  -will continue chemo for 3 more months before liver resection   Assessment & Plan Metastatic  colon cancer involving liver and intrahepatic bile duct Undergoing chemotherapy with manageable side effects. Weight stable at 252 lbs. No fever or chills. Blood work shows normal magnesium  and potassium levels. Urine negative. - Continue chemotherapy regimen - Scheduled follow-up appointment in two weeks  Chemotherapy-induced skin changes Experiencing skin darkening and black spots, known as 'cow spots', attributed to chemotherapy. Not dangerous and expected to resolve post-chemotherapy. - Continue current management with lotion application  Chemotherapy-induced fatigue and appetite changes Fatigue and appetite changes persist but are manageable. Weight stable, indicating adequate nutritional intake. - Continue current management and monitor symptoms  Hypomagnesemia and hypokalemia on supplementation Magnesium  and potassium levels are normal with current supplementation. Requires magnesium  refill due to cost concerns with over-the-counter options. - Refilled magnesium  prescription and sent to pharmacy on [address] - Continue current potassium supplementation  Plan - She is clinically doing well, lab reviewed, will proceed to chemo today - She will return in 2 weeks for last cycle planned chemotherapy - Will message her liver surgeon Dr. Romero at [facility]   SUMMARY OF ONCOLOGIC HISTORY: Oncology History  Adenocarcinoma of descending colon (HCC)  09/11/2023 Cancer Staging   Staging form: Colon and Rectum, AJCC 8th Edition - Clinical stage from 09/11/2023: Stage IVA (cTX, cN1, cM1a) - Signed by [name], MD on 10/12/2023   10/01/2023 Initial Diagnosis   Adenocarcinoma of descending colon (HCC)   10/24/2023 -  Chemotherapy   Patient is on Treatment Plan : COLORECTAL FOLFOXIRI q14d      Genetic Testing   Negative CancerNext +RNAinsight. The [employer] CancerNext+RNAinsight Panel includes sequencing, rearrangement analysis, and RNA analysis for the following 39 genes: APC, ATM, BAP1,  BARD1, BMPR1A, BRCA1, BRCA2, BRIP1, CDH1, CDKN2A,  CHEK2, FH, FLCN, MET, MLH1, MSH2, MSH6, MUTYH, NF1, NTHL1, PALB2, PMS2, PTEN, RAD51C, RAD51D, SMAD4, STK11, TP53, TSC1, TSC2, and VHL (sequencing and deletion/duplication); AXIN2, HOXB13, MBD4, MSH3, POLD1 and POLE (sequencing only); EPCAM and GREM1 (deletion/duplication only). Report date 11/04/23.       Discussed the use of AI scribe software for clinical note transcription with the patient, who gave verbal consent to proceed.  History of Present Illness Ashley [name] is a 38 year old female with metastatic colon cancer who presents for follow-up.  She experiences no issues with her last chemotherapy treatment. The chemotherapy-induced rash, described as 'cow spots', is manageable with lotion. Skin discoloration, noted as dark spots, is also attributed to chemotherapy.  Persistent fatigue and appetite issues are present but stable. Her weight remains stable, and she is actively working to maintain it.  She requires a refill for magnesium , taken twice daily, as the over-the-counter option is too expensive. Her potassium medication supply is sufficient.  She often feels thirsty but maintains adequate hydration with water and cranberry juice. She has no fever or chills.     All other systems were reviewed with the patient and are negative.  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   Irregular periods/menstrual cycles 01/30/2012   Late prenatal care 01/30/2012   Poor social situation 01/30/2012   Previous cesarean section complicating pregnancy, antepartum condition or complication 01/27/2016   2 previous cesarean section- Desires repeat with BTL   Single liveborn, born in hospital, delivered by cesarean delivery 08/13/2016   Status post repeat low transverse  cesarean section 08/14/2016   Status post tubal ligation at time of delivery, current hosp 08/14/2016    SURGICAL HISTORY: Past Surgical History:  Procedure Laterality Date   CESAREAN SECTION  2009   CESAREAN SECTION  01/30/2012   Procedure: CESAREAN SECTION;  Surgeon: Ovid DELENA All, MD;  Location: [facility];  Service: Gynecology;  Laterality: N/A;  Repeat C/S   CESAREAN SECTION WITH BILATERAL TUBAL LIGATION Bilateral 08/13/2016   Procedure: CESAREAN SECTION WITH BILATERAL TUBAL LIGATION;  Surgeon: Burnard VEAR Pate, MD;  Location: [facility];  Service: Obstetrics;  Laterality: Bilateral;   CHOLECYSTECTOMY N/A 10/16/2013   Procedure: LAPAROSCOPIC CHOLECYSTECTOMY WITH INTRAOPERATIVE CHOLANGIOGRAM;  Surgeon: Donnice POUR. Belinda, MD;  Location: [facility] OR;  Service: General;  Laterality: N/A;   COLONOSCOPY WITH PROPOFOL  N/A 09/11/2023   Procedure: COLONOSCOPY WITH PROPOFOL ;  Surgeon: [name], MD;  Location: [facility];  Service: Gastroenterology;  Laterality: N/A;   DILATION AND EVACUATION N/A 07/28/2015   Procedure: SUCTION, DILATATION AND EVACUATION;  Surgeon: Carlin DELENA Centers, MD;  Location: [facility];  Service: Gynecology;  Laterality: N/A;   ESOPHAGOGASTRODUODENOSCOPY (EGD) WITH PROPOFOL  N/A 09/11/2023   Procedure: ESOPHAGOGASTRODUODENOSCOPY (EGD) WITH PROPOFOL ;  Surgeon: [name], MD;  Location: [facility];  Service: Gastroenterology;  Laterality: N/A;   IR IMAGING GUIDED PORT INSERTION  10/20/2023   IR US  LIVER BIOPSY  10/20/2023   SUBMUCOSAL INJECTION  09/11/2023   Procedure: INJECTION, SUBMUCOSAL;  Surgeon: [name], MD;  Location: [facility];  Service: Gastroenterology;;   TONSILLECTOMY  AGE 70   TONSILLECTOMY AND ADENOIDECTOMY  AGEB 18    I have reviewed the social history and family history with the patient and they are unchanged from previous note.  ALLERGIES:  is allergic to fruit extracts.  MEDICATIONS:  Current Outpatient Medications  Medication Sig Dispense  Refill   doxycycline  (VIBRA -TABS) 100 MG tablet TAKE 1 TABLET BY MOUTH TWICE A DAY 60 tablet 1   Hydrocortisone Acetate (MEDI-CORTISONE EX) Apply 1 application  topically daily. Applied to face once daily     lidocaine -prilocaine  (EMLA ) cream Apply to affected area once (Patient taking differently: Apply 1 Application topically daily as needed (prior to port being accessed.).) 30 g 3   loratadine  (CLARITIN ) 10 MG tablet Take 1 tablet (10 mg total) by mouth daily. 90 tablet 0   methocarbamol  (ROBAXIN ) 500 MG tablet Take 1 tablet (500 mg total) by mouth every 8 (eight) hours as needed for muscle spasms. 20 tablet 1   NON FORMULARY Take 1 Dose by mouth daily. [place]     omeprazole  (PRILOSEC  OTC) 20 MG tablet Take 1 tablet (20 mg total) by mouth daily. 28 tablet 1   OVER THE COUNTER MEDICATION Take 2 each by mouth in the morning. Soursop Garviola Gummies     potassium chloride  SA (KLOR-CON  M) 20 MEQ tablet Take 1 tablet (20 mEq total) by mouth daily. 30 tablet 1   Prenatal Vit-Fe Fumarate-FA (MULTIVITAMIN-PRENATAL) 27-0.8 MG TABS tablet Take 1 tablet by mouth in the morning.     magnesium  oxide (MAG-OX) 400 (240 Mg) MG tablet Take 1 tablet (400 mg total) by mouth 2 (two) times daily. 60 tablet 1   No current facility-administered medications for this visit.   Facility-Administered Medications Ordered in Other Visits  Medication Dose Route Frequency Provider Last Rate Last Admin   atropine  injection 0.5 mg  0.5 mg Intravenous Once PRN [name], MD       dextrose  5 % solution   Intravenous Continuous [name], MD       fluorouracil  (ADRUCIL ) 6,200 mg in sodium chloride  0.9 % 126 mL chemo infusion  6,200 mg Intravenous 1 day or 1 dose [name], MD       irinotecan  (CAMPTOSAR ) 340 mg in sodium chloride  0.9 % 500 mL chemo infusion  150 mg/m2 (Treatment Plan Recorded) Intravenous Once [name], MD       leucovorin  900 mg in dextrose  5 % 250 mL infusion  400 mg/m2 (Treatment Plan Recorded)  Intravenous Once [name], MD       oxaliplatin  (ELOXATIN ) 200 mg in dextrose  5 % 500 mL chemo infusion  85 mg/m2 (Treatment Plan Recorded) Intravenous Once [name], MD       panitumumab  (VECTIBIX ) 700 mg in sodium chloride  0.9 % 100 mL chemo infusion  6 mg/kg (Treatment Plan Recorded) Intravenous Once [name], MD 270 mL/hr at 05/27/24 1253 700 mg at 05/27/24 1253   sodium chloride  flush (NS) 0.9 % injection 10 mL  10 mL Intracatheter PRN [name], MD        PHYSICAL EXAMINATION: ECOG PERFORMANCE STATUS: 1 - Symptomatic but completely ambulatory  Vitals:   05/27/24 0900 05/27/24 0931  BP: (!) 125/94 118/73  Pulse: (!) 104 98  Resp:  17  Temp: 98.4 F (36.9 C)   SpO2: 99% 98%   Wt Readings from Last 3 Encounters:  05/27/24 253 lb (114.8 kg)  05/13/24 256 lb (116.1 kg)  05/13/24 225 lb 9.6 oz (102.3 kg)     GENERAL:alert, no distress and comfortable SKIN: skin color, texture, turgor are normal, skin hyperpigmentation in hands, and thumb.  She has diffuse acne-like rash on her face and neck. EYES: normal,  Conjunctiva are pink and non-injected, sclera clear NECK: supple, thyroid  normal size, non-tender, without nodularity LYMPH:  no palpable lymphadenopathy in the cervical, axillary  LUNGS: clear to auscultation and percussion with normal breathing effort HEART: regular rate & rhythm and no murmurs and no lower extremity edema ABDOMEN:abdomen soft, non-tender and normal bowel sounds Musculoskeletal:no cyanosis of digits and no clubbing  NEURO: alert & oriented x 3 with fluent speech, no focal motor/sensory deficits  Physical Exam MEASUREMENTS: Weight- 252. SKIN: Dark spots on skin, likely due to chemotherapy.  LABORATORY DATA:  I have reviewed the data as listed    Latest Ref Rng & Units 05/27/2024    8:54 AM 05/13/2024    9:02 AM 04/29/2024    9:00 AM  CBC  WBC 4.0 - 10.5 K/uL 5.7  3.7  4.8   Hemoglobin 12.0 - 15.0 g/dL 88.3  89.8  89.3   Hematocrit 36.0 - 46.0 %  36.6  32.3  33.2   Platelets 150 - 400 K/uL 113  96  94         Latest Ref Rng & Units 05/27/2024    8:54 AM 05/13/2024    9:02 AM 04/29/2024    9:00 AM  CMP  Glucose 70 - 99 mg/dL 97  97  92   BUN 6 - 20 mg/dL 5  6  6    Creatinine 0.44 - 1.00 mg/dL 9.45  9.50  9.47   Sodium 135 - 145 mmol/L 140  141  140   Potassium 3.5 - 5.1 mmol/L 3.5  3.3  3.5   Chloride 98 - 111 mmol/L 104  108  106   CO2 22 - 32 mmol/L 26  28  29    Calcium  8.9 - 10.3 mg/dL 9.4  8.7  9.0   Total Protein 6.5 - 8.1 g/dL 7.7  6.7  7.1   Total Bilirubin 0.0 - 1.2 mg/dL 0.4  0.3  0.3   Alkaline Phos 38 - 126 U/L 142  112  118   AST 15 - 41 U/L 24  15  15    ALT 0 - 44 U/L 16  11  13        RADIOGRAPHIC STUDIES: I have personally reviewed the radiological images as listed and agreed with the findings in the report. No results found.    No orders of the defined types were placed in this encounter.  All questions were answered. The patient knows to call the clinic with any problems, questions or concerns. No barriers to learning was detected. The total time spent in the appointment was 25 minutes, including review of chart and various tests results, discussions about plan of care and coordination of care plan     Onita Mattock, MD 05/27/2024

## 2024-05-29 ENCOUNTER — Inpatient Hospital Stay

## 2024-05-29 VITALS — BP 121/71 | HR 90 | Temp 97.7°F | Resp 19

## 2024-05-29 DIAGNOSIS — C186 Malignant neoplasm of descending colon: Secondary | ICD-10-CM

## 2024-05-29 DIAGNOSIS — Z5111 Encounter for antineoplastic chemotherapy: Secondary | ICD-10-CM | POA: Diagnosis not present

## 2024-05-29 MED ORDER — PEGFILGRASTIM-CBQV 6 MG/0.6ML ~~LOC~~ SOSY
6.0000 mg | PREFILLED_SYRINGE | Freq: Once | SUBCUTANEOUS | Status: AC
  Administered 2024-05-29: 6 mg via SUBCUTANEOUS

## 2024-05-29 NOTE — Patient Instructions (Signed)
 [facility] A DEPT OF [place]  Discharge Instructions: Thank you for choosing [facility] to provide your oncology and hematology care.   If you have a lab appointment with the Cancer Center, please go directly to the Cancer Center and check in at the registration area.   Wear comfortable clothing and clothing appropriate for easy access to any Portacath or PICC line.   We strive to give you quality time with your provider. You may need to reschedule your appointment if you arrive late (15 or more minutes).  Arriving late affects you and other patients whose appointments are after yours.  Also, if you miss three or more appointments without notifying the office, you may be dismissed from the clinic at the provider's discretion.      For prescription refill requests, have your pharmacy contact our office and allow 72 hours for refills to be completed.    Today you received the following chemotherapy and/or immunotherapy agents: Undeyca     To help prevent nausea and vomiting after your treatment, we encourage you to take your nausea medication as directed.  BELOW ARE SYMPTOMS THAT SHOULD BE REPORTED IMMEDIATELY: *FEVER GREATER THAN 100.4 F (38 C) OR HIGHER *CHILLS OR SWEATING *NAUSEA AND VOMITING THAT IS NOT CONTROLLED WITH YOUR NAUSEA MEDICATION *UNUSUAL SHORTNESS OF BREATH *UNUSUAL BRUISING OR BLEEDING *URINARY PROBLEMS (pain or burning when urinating, or frequent urination) *BOWEL PROBLEMS (unusual diarrhea, constipation, pain near the anus) TENDERNESS IN MOUTH AND THROAT WITH OR WITHOUT PRESENCE OF ULCERS (sore throat, sores in mouth, or a toothache) UNUSUAL RASH, SWELLING OR PAIN  UNUSUAL VAGINAL DISCHARGE OR ITCHING   Items with * indicate a potential emergency and should be followed up as soon as possible or go to the Emergency Department if any problems should occur.  Please show the CHEMOTHERAPY ALERT CARD or IMMUNOTHERAPY  ALERT CARD at check-in to the Emergency Department and triage nurse.  Should you have questions after your visit or need to cancel or reschedule your appointment, please contact [name]  Dept: [phone]  and follow the prompts.  Office hours are 8:00 a.m. to 4:30 p.m. Monday - Friday. Please note that voicemails left after 4:00 p.m. may not be returned until the following business day.  We are closed weekends and major holidays. You have access to a nurse at all times for urgent questions. Please call the main number to the clinic Dept: [phone] and follow the prompts.   For any non-urgent questions, you may also contact your provider using MyChart. We now offer e-Visits for anyone 73 and older to request care online for non-urgent symptoms. For details visit [email or web address].   Also download the MyChart app! Go to the app store, search MyChart, open the app, select [place], and log in with your MyChart username and password.

## 2024-06-09 NOTE — Assessment & Plan Note (Signed)
-  Stage IV with oligometastasis, MSS, HER2 (+), UGT1A1 (+), KRAS/NRAS/BRAF wild type -Presented with abdominal pain, change of bowel habits, and a 50 pound weight loss. -Colonoscopy showed malignant obstructive tumor in the distal descending colon, biopsy showed moderate differentiated invasive adenocarcinoma. -CT scan showed a 2.6 cm oligo liver metastasis.  Biopsy is pending -Patient was seen by colorectal surgeon Dr. Sheldon on October 10, 2023. -Would recommend neoadjuvant chemotherapy for 3 months, followed by surgery (left hemicolectomy, liver resection), or liver ablation or radiation if she respond well to chemo  -She started chemo FOLFOXIRI on 10/24/2023 -I discussed the NGS Tempus result and recommending Vectibix  to her chemo. She started from cycle 4 chemo  -restaging CT 6/24 showed slightly decreased colon and liver mets, no new lesions.  -Surgeon Dr. Dasie reviewed her case and recommend pt to be referred to [facility] for liver resection   - She underwent a hemicolectomy by Dr. Sheldon on February 16, 2024.  Surgical pathology showed T4bN0 with clear margins  -will continue chemo for 3 more months before liver resection

## 2024-06-10 ENCOUNTER — Inpatient Hospital Stay: Admitting: Hematology

## 2024-06-10 ENCOUNTER — Inpatient Hospital Stay

## 2024-06-10 ENCOUNTER — Inpatient Hospital Stay: Attending: Nurse Practitioner

## 2024-06-10 VITALS — BP 129/85 | HR 101 | Temp 96.8°F | Resp 17 | Wt 258.9 lb

## 2024-06-10 VITALS — HR 90

## 2024-06-10 DIAGNOSIS — C787 Secondary malignant neoplasm of liver and intrahepatic bile duct: Secondary | ICD-10-CM | POA: Insufficient documentation

## 2024-06-10 DIAGNOSIS — Z79634 Long term (current) use of topoisomerase inhibitor: Secondary | ICD-10-CM | POA: Diagnosis not present

## 2024-06-10 DIAGNOSIS — Z5111 Encounter for antineoplastic chemotherapy: Secondary | ICD-10-CM | POA: Insufficient documentation

## 2024-06-10 DIAGNOSIS — C186 Malignant neoplasm of descending colon: Secondary | ICD-10-CM | POA: Diagnosis not present

## 2024-06-10 DIAGNOSIS — Z79630 Long term (current) use of alkylating agent: Secondary | ICD-10-CM | POA: Insufficient documentation

## 2024-06-10 DIAGNOSIS — Z5112 Encounter for antineoplastic immunotherapy: Secondary | ICD-10-CM | POA: Diagnosis present

## 2024-06-10 DIAGNOSIS — Z79899 Other long term (current) drug therapy: Secondary | ICD-10-CM | POA: Diagnosis not present

## 2024-06-10 LAB — CMP (CANCER CENTER ONLY)
ALT: 16 U/L (ref 0–44)
AST: 22 U/L (ref 15–41)
Albumin: 3.7 g/dL (ref 3.5–5.0)
Alkaline Phosphatase: 137 U/L — ABNORMAL HIGH (ref 38–126)
Anion gap: 10 (ref 5–15)
BUN: <5 mg/dL — ABNORMAL LOW (ref 6–20)
CO2: 27 mmol/L (ref 22–32)
Calcium: 8.9 mg/dL (ref 8.9–10.3)
Chloride: 103 mmol/L (ref 98–111)
Creatinine: 0.45 mg/dL (ref 0.44–1.00)
GFR, Estimated: >60 mL/min (ref >60)
Glucose, Bld: 96 mg/dL (ref 70–99)
Potassium: 3.3 mmol/L — ABNORMAL LOW (ref 3.5–5.1)
Sodium: 139 mmol/L (ref 135–145)
Total Bilirubin: 0.5 mg/dL (ref 0.0–1.2)
Total Protein: 7.1 g/dL (ref 6.5–8.1)

## 2024-06-10 LAB — CBC WITH DIFFERENTIAL (CANCER CENTER ONLY)
Abs Immature Granulocytes: 0.14 K/uL — ABNORMAL HIGH (ref 0.00–0.07)
Basophils Absolute: 0 K/uL (ref 0.0–0.1)
Basophils Relative: 0 %
Eosinophils Absolute: 0.1 K/uL (ref 0.0–0.5)
Eosinophils Relative: 2 %
HCT: 32.5 % — ABNORMAL LOW (ref 36.0–46.0)
Hemoglobin: 10.6 g/dL — ABNORMAL LOW (ref 12.0–15.0)
Immature Granulocytes: 3 %
Lymphocytes Relative: 27 %
Lymphs Abs: 1.3 K/uL (ref 0.7–4.0)
MCH: 25.4 pg — ABNORMAL LOW (ref 26.0–34.0)
MCHC: 32.6 g/dL (ref 30.0–36.0)
MCV: 77.9 fL — ABNORMAL LOW (ref 80.0–100.0)
Monocytes Absolute: 0.8 K/uL (ref 0.1–1.0)
Monocytes Relative: 15 %
Neutro Abs: 2.6 K/uL (ref 1.7–7.7)
Neutrophils Relative %: 53 %
Platelet Count: 107 K/uL — ABNORMAL LOW (ref 150–400)
RBC: 4.17 MIL/uL (ref 3.87–5.11)
RDW: 19.8 % — ABNORMAL HIGH (ref 11.5–15.5)
WBC Count: 4.9 K/uL (ref 4.0–10.5)
nRBC: 0 % (ref 0.0–0.2)

## 2024-06-10 LAB — MAGNESIUM: Magnesium: 1.5 mg/dL — ABNORMAL LOW (ref 1.7–2.4)

## 2024-06-10 MED ORDER — PALONOSETRON HCL INJECTION 0.25 MG/5ML
0.2500 mg | Freq: Once | INTRAVENOUS | Status: AC
  Administered 2024-06-10: 0.25 mg via INTRAVENOUS
  Filled 2024-06-10: qty 5

## 2024-06-10 MED ORDER — DIPHENHYDRAMINE HCL 25 MG PO CAPS
25.0000 mg | ORAL_CAPSULE | Freq: Once | ORAL | Status: AC
  Administered 2024-06-10: 25 mg via ORAL
  Filled 2024-06-10: qty 1

## 2024-06-10 MED ORDER — MAGNESIUM SULFATE 2 GM/50ML IV SOLN
2.0000 g | Freq: Once | INTRAVENOUS | Status: AC
  Administered 2024-06-10: 2 g via INTRAVENOUS
  Filled 2024-06-10: qty 50

## 2024-06-10 MED ORDER — SODIUM CHLORIDE 0.9 % IV SOLN
6.0000 mg/kg | Freq: Once | INTRAVENOUS | Status: AC
  Administered 2024-06-10: 700 mg via INTRAVENOUS
  Filled 2024-06-10: qty 15

## 2024-06-10 MED ORDER — LEUCOVORIN CALCIUM INJECTION 350 MG
400.0000 mg/m2 | Freq: Once | INTRAVENOUS | Status: AC
  Administered 2024-06-10: 900 mg via INTRAVENOUS
  Filled 2024-06-10: qty 45

## 2024-06-10 MED ORDER — SODIUM CHLORIDE 0.9 % IV SOLN
6200.0000 mg | INTRAVENOUS | Status: DC
  Administered 2024-06-10: 6200 mg via INTRAVENOUS
  Filled 2024-06-10: qty 124

## 2024-06-10 MED ORDER — DEXTROSE 5 % IV SOLN: INTRAVENOUS | Status: DC

## 2024-06-10 MED ORDER — OXALIPLATIN CHEMO INJECTION 100 MG/20ML
85.0000 mg/m2 | Freq: Once | INTRAVENOUS | Status: AC
  Administered 2024-06-10: 200 mg via INTRAVENOUS
  Filled 2024-06-10: qty 40

## 2024-06-10 MED ORDER — DEXAMETHASONE SOD PHOSPHATE PF 10 MG/ML IJ SOLN
10.0000 mg | Freq: Once | INTRAMUSCULAR | Status: AC
  Administered 2024-06-10: 10 mg via INTRAVENOUS

## 2024-06-10 MED ORDER — FAMOTIDINE IN NACL 20-0.9 MG/50ML-% IV SOLN
20.0000 mg | Freq: Once | INTRAVENOUS | Status: AC
  Administered 2024-06-10: 20 mg via INTRAVENOUS
  Filled 2024-06-10: qty 50

## 2024-06-10 MED ORDER — SODIUM CHLORIDE 0.9 % IV SOLN: Freq: Once | INTRAVENOUS | Status: AC

## 2024-06-10 MED ORDER — SODIUM CHLORIDE 0.9 % IV SOLN
150.0000 mg/m2 | Freq: Once | INTRAVENOUS | Status: AC
  Administered 2024-06-10: 340 mg via INTRAVENOUS
  Filled 2024-06-10: qty 15

## 2024-06-10 MED ORDER — ATROPINE SULFATE 1 MG/ML IV SOLN
0.5000 mg | Freq: Once | INTRAVENOUS | Status: AC | PRN
  Administered 2024-06-10: 0.5 mg via INTRAVENOUS
  Filled 2024-06-10: qty 1

## 2024-06-10 MED ORDER — APREPITANT 130 MG/18ML IV EMUL
130.0000 mg | Freq: Once | INTRAVENOUS | Status: AC
  Administered 2024-06-10: 130 mg via INTRAVENOUS
  Filled 2024-06-10: qty 18

## 2024-06-10 NOTE — Patient Instructions (Signed)
 [facility] - A DEPT OF [place]  Discharge Instructions: Thank you for choosing [facility] to provide your oncology and hematology care.   If you have a lab appointment with the Cancer Center, please go directly to the Cancer Center and check in at the registration area.   Wear comfortable clothing and clothing appropriate for easy access to any Portacath or PICC line.   We strive to give you quality time with your provider. You may need to reschedule your appointment if you arrive late (15 or more minutes).  Arriving late affects you and other patients whose appointments are after yours.  Also, if you miss three or more appointments without notifying the office, you may be dismissed from the clinic at the provider's discretion.      For prescription refill requests, have your pharmacy contact our office and allow 72 hours for refills to be completed.    Today you received the following chemotherapy and/or immunotherapy agents: Panitumumab  (Vectibix ), Irinotecan  (Camptosar ), Oxaliplatin  (Eloxatin ), Leucovorin , & Fluorouracil  (Adrucil )    To help prevent nausea and vomiting after your treatment, we encourage you to take your nausea medication as directed.  BELOW ARE SYMPTOMS THAT SHOULD BE REPORTED IMMEDIATELY: *FEVER GREATER THAN 100.4 F (38 C) OR HIGHER *CHILLS OR SWEATING *NAUSEA AND VOMITING THAT IS NOT CONTROLLED WITH YOUR NAUSEA MEDICATION *UNUSUAL SHORTNESS OF BREATH *UNUSUAL BRUISING OR BLEEDING *URINARY PROBLEMS (pain or burning when urinating, or frequent urination) *BOWEL PROBLEMS (unusual diarrhea, constipation, pain near the anus) TENDERNESS IN MOUTH AND THROAT WITH OR WITHOUT PRESENCE OF ULCERS (sore throat, sores in mouth, or a toothache) UNUSUAL RASH, SWELLING OR PAIN  UNUSUAL VAGINAL DISCHARGE OR ITCHING   Items with * indicate a potential emergency and should be followed up as soon as possible or go to the Emergency  Department if any problems should occur.  Please show the CHEMOTHERAPY ALERT CARD or IMMUNOTHERAPY ALERT CARD at check-in to the Emergency Department and triage nurse.  Should you have questions after your visit or need to cancel or reschedule your appointment, please contact [name]  Dept: [phone]  and follow the prompts.  Office hours are 8:00 a.m. to 4:30 p.m. Monday - Friday. Please note that voicemails left after 4:00 p.m. may not be returned until the following business day.  We are closed weekends and major holidays. You have access to a nurse at all times for urgent questions. Please call the main number to the clinic Dept: [phone] and follow the prompts.   For any non-urgent questions, you may also contact your provider using MyChart. We now offer e-Visits for anyone 86 and older to request care online for non-urgent symptoms. For details visit [email or web address].   Also download the MyChart app! Go to the app store, search MyChart, open the app, select [place], and log in with your MyChart username and password.  Hypomagnesemia Hypomagnesemia is a condition in which the level of magnesium  in the blood is too low. Magnesium  is a mineral that is found in many foods. It is used in many different processes in the body. Hypomagnesemia can affect every organ in the body. In severe cases, it can cause life-threatening problems. What are the causes? This condition may be caused by: Not getting enough magnesium  in your diet or not having enough healthy foods to eat (malnutrition). Problems with magnesium  absorption in the  intestines. Dehydration. Excessive use of alcohol. Vomiting. Severe or long-term (chronic) diarrhea. Some medicines, including medicines that make you urinate more often (diuretics). Certain diseases, such as kidney disease, diabetes, celiac disease, and overactive thyroid . What are the signs or  symptoms? Symptoms of this condition include: Loss of appetite, nausea, and vomiting. Involuntary shaking or trembling of a body part (tremor). Muscle weakness or tingling in the arms and legs. Sudden tightening of muscles (muscle spasms). Confusion. Psychiatric issues, such as: Depression and irritability. Psychosis. A feeling of fluttering of the heart (palpitations). Seizures. These symptoms are more severe if magnesium  levels drop suddenly. How is this diagnosed? This condition may be diagnosed based on: Your symptoms and medical history. A physical exam. Blood and urine tests. How is this treated? Treatment depends on the cause and the severity of the condition. It may be treated by: Taking a magnesium  supplement. This can be taken in pill form. If the condition is severe, magnesium  is usually given through an IV. Making changes to your diet. You may be directed to eat foods that have a lot of magnesium , such as green leafy vegetables, peas, beans, and nuts. Not drinking alcohol. If you are struggling not to drink, ask your health care provider for help. Follow these instructions at home: Eating and drinking     Make sure that your diet includes foods with magnesium . Foods that have a lot of magnesium  in them include: Green leafy vegetables, such as spinach and broccoli. Beans and peas. Nuts and seeds, such as almonds and sunflower seeds. Whole grains, such as whole grain bread and fortified cereals. Drink fluids that contain salts and minerals (electrolytes), such as sports drinks, when you are active. Do not drink alcohol. General instructions Take over-the-counter and prescription medicines only as told by your health care provider. Take magnesium  supplements as directed if your health care provider tells you to take them. Have your magnesium  levels monitored as told by your health care provider. Keep all follow-up visits. This is important. Contact a health care  provider if: You get worse instead of better. Your symptoms return. Get help right away if: You develop severe muscle weakness. You have trouble breathing. You feel that your heart is racing. These symptoms may represent a serious problem that is an emergency. Do not wait to see if the symptoms will go away. Get medical help right away. Call your local emergency services (911 in the [place] Do not drive yourself to the hospital. Summary Hypomagnesemia is a condition in which the level of magnesium  in the blood is too low. Hypomagnesemia can affect every organ in the body. Treatment may include eating more foods that contain magnesium , taking magnesium  supplements, and not drinking alcohol. Have your magnesium  levels monitored as told by your health care provider. This information is not intended to replace advice given to you by your health care provider. Make sure you discuss any questions you have with your health care provider. Document Revised: 11/17/2020 Document Reviewed: 11/17/2020 Elsevier Patient Education  2024 [employer].

## 2024-06-10 NOTE — Progress Notes (Signed)
 [facility]   [phone] [phone]   Clinic Follow up Note   Patient Care Team: [name], MD as PCP - General (Family Medicine) [name], [name], RN as Oncology Nurse Navigator [name], MD as Consulting Physician (Gastroenterology) [name], MD as Consulting Physician (Medical Oncology) [name], MD as Consulting Physician (Obstetrics and Gynecology) [name], MD as Consulting Physician (General Surgery) [name], MD as Consulting Physician (General Surgery)  Date of Service:  06/10/2024  CHIEF COMPLAINT: f/u of  metastatic colon cancer  CURRENT THERAPY:  Chemo FOLFOXIRI and vectibix    Oncology History   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 -Colonoscopy showed malignant obstructive tumor in the distal descending colon, biopsy showed moderate differentiated invasive adenocarcinoma. -CT scan showed a 2.6 cm oligo liver metastasis.  Biopsy is pending -Patient was seen by colorectal surgeon Dr. Sheldon on October 10, 2023. -Would recommend neoadjuvant chemotherapy for 3 months, followed by surgery (left hemicolectomy, liver resection), or liver ablation or radiation if she respond well to chemo  -She started chemo FOLFOXIRI on 10/24/2023 -I discussed the NGS Tempus result and recommending Vectibix  to her chemo. She started from cycle 4 chemo  -restaging CT 6/24 showed slightly decreased colon and liver mets, no new lesions.  -Surgeon Dr. Dasie reviewed her case and recommend pt to be referred to [facility] for liver resection   - She underwent a hemicolectomy by Dr. Sheldon on February 16, 2024.  Surgical pathology showed T4bN0 with clear margins  -will continue chemo for 3 more months before liver resection   Assessment & Plan Metastatic colon cancer  undergoing chemotherapy Currently undergoing chemotherapy with the last cycle scheduled. Blood counts are stable. Awaiting kidney and liver function test results. Scheduled for a CT scan and consultation at [facility] on Friday. Potential liver surgery discussed, with embolization considered to enhance liver function post-resection. Liver surgery carries higher risk due to the organ's size and blood vessel density. - Proceeded with last cycle of chemotherapy - Await kidney and liver function test results - Attend CT scan and consultation at [facility] on Friday - Will discuss potential liver surgery and liver embolization with IR at [facility]   Chemotherapy-induced peripheral neuropathy Mild peripheral neuropathy in the hand, exacerbated by cold. No significant impact on daily activities such as cooking and cleaning.  Chemotherapy-induced rash Mild rash managed with lotion and cream. Vaseline is effective but not always available. - Continue using lotion and cream for rash management  Chemotherapy-induced fatigue Significant fatigue, particularly after chemotherapy sessions. Benadryl  administered during treatment aids in sleep. Nausea medication discontinued due to increased drowsiness. - Continue administering Benadryl  during chemotherapy sessions  Hypokalemia and hypomagnesemia - Likely secondary to Vectibix  - Will continue IV magnesium  2 g, she will start oral mag and potassium  Plan - lab reviewed, adequate treatment, will proceed last planned chemotherapy today -she will f/u at [facility] later this week with CT, and plan for liver surgery  -I will see her back in 1-2 months, depends on her surgical date    SUMMARY OF ONCOLOGIC HISTORY: Oncology History  Adenocarcinoma of descending colon (HCC)  09/11/2023 Cancer Staging   Staging form: Colon and Rectum, AJCC 8th Edition - Clinical stage from 09/11/2023: Stage IVA (cTX, cN1, cM1a) - Signed by [name], MD on 10/12/2023   10/01/2023 Initial Diagnosis  Adenocarcinoma of descending colon (HCC)   10/24/2023 -  Chemotherapy   Patient is on Treatment Plan : COLORECTAL FOLFOXIRI q14d      Genetic Testing   Negative CancerNext +RNAinsight. The [employer] CancerNext+RNAinsight Panel includes sequencing, rearrangement analysis, and RNA analysis for the following 39 genes: APC, ATM, BAP1, BARD1, BMPR1A, BRCA1, BRCA2, BRIP1, CDH1, CDKN2A, CHEK2, FH, FLCN, MET, MLH1, MSH2, MSH6, MUTYH, NF1, NTHL1, PALB2, PMS2, PTEN, RAD51C, RAD51D, SMAD4, STK11, TP53, TSC1, TSC2, and VHL (sequencing and deletion/duplication); AXIN2, HOXB13, MBD4, MSH3, POLD1 and POLE (sequencing only); EPCAM and GREM1 (deletion/duplication only). Report date 11/04/23.       Discussed the use of AI scribe software for clinical note transcription with the patient, who gave verbal consent to proceed.  History of Present Illness Ashley [name] is a 38 year old female with metastatic colon cancer who presents for follow-up.  She reports significant fatigue since the last visit, with some improvement this week. She did not take her prescribed nausea medication after the first cycle because it caused drowsiness. Benadryl  during infusion helps her sleep through treatment.  She has neuropathy in her hands that worsens in the cold but she can still perform daily activities such as cooking and cleaning.  She follows urine protein testing every other treatment and checks before voiding to provide samples when needed. She drinks plenty of water to stay hydrated and notes dry mouth from chemotherapy.  She has a chemotherapy-related rash and uses cream and Vaseline, although she forgot the Vaseline today. She is trying to eat less pork and more chicken, turkey, fish, and shrimp, and continues to drink juices and water.     All other systems were reviewed with the patient and are negative.  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   Irregular periods/menstrual cycles 01/30/2012   Late prenatal care 01/30/2012   Poor social situation 01/30/2012   Previous cesarean section complicating pregnancy, antepartum condition or complication 01/27/2016   2 previous cesarean section- Desires repeat with BTL   Single liveborn, born in hospital, delivered by cesarean delivery 08/13/2016   Status post repeat low transverse cesarean section 08/14/2016   Status post tubal ligation at time of delivery, current hosp 08/14/2016    SURGICAL HISTORY: Past Surgical History:  Procedure Laterality Date   CESAREAN SECTION  2009   CESAREAN SECTION  01/30/2012   Procedure: CESAREAN SECTION;  Surgeon: Ovid DELENA All, MD;  Location: [facility]S;  Service: Gynecology;  Laterality: N/A;  Repeat C/S   CESAREAN SECTION WITH BILATERAL TUBAL LIGATION Bilateral 08/13/2016   Procedure: CESAREAN SECTION WITH BILATERAL TUBAL LIGATION;  Surgeon: Burnard VEAR Pate, MD;  Location: [facility];  Service: Obstetrics;  Laterality: Bilateral;   CHOLECYSTECTOMY N/A 10/16/2013   Procedure: LAPAROSCOPIC CHOLECYSTECTOMY WITH INTRAOPERATIVE CHOLANGIOGRAM;  Surgeon: Donnice POUR. Belinda, MD;  Location: [facility] OR;  Service: General;  Laterality: N/A;   COLONOSCOPY WITH PROPOFOL  N/A 09/11/2023   Procedure: COLONOSCOPY WITH PROPOFOL ;  Surgeon: [name], MD;  Location: [facility];  Service: Gastroenterology;  Laterality: N/A;   DILATION AND EVACUATION N/A 07/28/2015   Procedure: SUCTION, DILATATION AND EVACUATION;  Surgeon: Carlin DELENA Centers, MD;  Location: [facility];  Service: Gynecology;  Laterality: N/A;   ESOPHAGOGASTRODUODENOSCOPY (EGD) WITH PROPOFOL  N/A 09/11/2023   Procedure: ESOPHAGOGASTRODUODENOSCOPY (  EGD) WITH PROPOFOL ;  Surgeon: [name], MD;  Location: [facility];  Service: Gastroenterology;  Laterality: N/A;   IR IMAGING GUIDED PORT INSERTION  10/20/2023   IR  US  LIVER BIOPSY  10/20/2023   SUBMUCOSAL INJECTION  09/11/2023   Procedure: INJECTION, SUBMUCOSAL;  Surgeon: [name], MD;  Location: [facility];  Service: Gastroenterology;;   TONSILLECTOMY  AGE 71   TONSILLECTOMY AND ADENOIDECTOMY  AGEB 18    I have reviewed the social history and family history with the patient and they are unchanged from previous note.  ALLERGIES:  is allergic to fruit extracts.  MEDICATIONS:  Current Outpatient Medications  Medication Sig Dispense Refill   doxycycline  (VIBRA -TABS) 100 MG tablet TAKE 1 TABLET BY MOUTH TWICE A DAY 60 tablet 1   Hydrocortisone Acetate (MEDI-CORTISONE EX) Apply 1 application  topically daily. Applied to face once daily     lidocaine -prilocaine  (EMLA ) cream Apply to affected area once (Patient taking differently: Apply 1 Application topically daily as needed (prior to port being accessed.).) 30 g 3   loratadine  (CLARITIN ) 10 MG tablet Take 1 tablet (10 mg total) by mouth daily. 90 tablet 0   magnesium  oxide (MAG-OX) 400 (240 Mg) MG tablet Take 1 tablet (400 mg total) by mouth 2 (two) times daily. 60 tablet 1   methocarbamol  (ROBAXIN ) 500 MG tablet Take 1 tablet (500 mg total) by mouth every 8 (eight) hours as needed for muscle spasms. 20 tablet 1   NON FORMULARY Take 1 Dose by mouth daily. [place]     omeprazole  (PRILOSEC  OTC) 20 MG tablet Take 1 tablet (20 mg total) by mouth daily. 28 tablet 1   OVER THE COUNTER MEDICATION Take 2 each by mouth in the morning. Soursop Garviola Gummies     potassium chloride  SA (KLOR-CON  M) 20 MEQ tablet Take 1 tablet (20 mEq total) by mouth daily. 30 tablet 1   Prenatal Vit-Fe Fumarate-FA (MULTIVITAMIN-PRENATAL) 27-0.8 MG TABS tablet Take 1 tablet by mouth in the morning.     No current facility-administered medications for this visit.   Facility-Administered Medications Ordered in Other Visits  Medication Dose Route Frequency Provider Last Rate Last Admin   0.9 %  sodium chloride  infusion    Intravenous Once [name], MD       aprepitant  (CINVANTI ) injection 130 mg  130 mg Intravenous Once [name], MD       atropine  injection 0.5 mg  0.5 mg Intravenous Once PRN [name], MD       dexamethasone  (DECADRON ) injection 10 mg  10 mg Intravenous Once [name], MD       dextrose  5 % solution   Intravenous Continuous [name], MD       diphenhydrAMINE  (BENADRYL ) capsule 25 mg  25 mg Oral Once [name], MD       famotidine  (PEPCID ) IVPB 20 mg premix  20 mg Intravenous Once [name], MD       fluorouracil  (ADRUCIL ) 6,200 mg in sodium chloride  0.9 % 126 mL chemo infusion  6,200 mg Intravenous 1 day or 1 dose [name], MD       irinotecan  (CAMPTOSAR ) 340 mg in sodium chloride  0.9 % 500 mL chemo infusion  150 mg/m2 (Treatment Plan Recorded) Intravenous Once [name], MD       leucovorin  900 mg in dextrose  5 % 250 mL infusion  400 mg/m2 (Treatment Plan Recorded) Intravenous Once [name], MD       magnesium  sulfate IVPB 2 g  50 mL  2 g Intravenous Once [name], MD       oxaliplatin  (ELOXATIN ) 200 mg in dextrose  5 % 500 mL chemo infusion  85 mg/m2 (Treatment Plan Recorded) Intravenous Once [name], MD       palonosetron  (ALOXI ) injection 0.25 mg  0.25 mg Intravenous Once [name], MD       panitumumab  (VECTIBIX ) 700 mg in sodium chloride  0.9 % 100 mL chemo infusion  6 mg/kg (Treatment Plan Recorded) Intravenous Once [name], MD        PHYSICAL EXAMINATION: ECOG PERFORMANCE STATUS: 1 - Symptomatic but completely ambulatory  Vitals:   06/10/24 0910  BP: 129/85  Pulse: (!) 101  Resp: 17  Temp: (!) 96.8 F (36 C)  SpO2: 100%   Wt Readings from Last 3 Encounters:  06/10/24 258 lb 14.4 oz (117.4 kg)  05/27/24 253 lb (114.8 kg)  05/13/24 256 lb (116.1 kg)     GENERAL:alert, no distress and comfortable SKIN: skin color, texture, turgor are normal, skin hyperpigmentation and acne like rash on face and neck EYES: normal, Conjunctiva are pink and non-injected, sclera  clear NECK: supple, thyroid  normal size, non-tender, without nodularity LYMPH:  no palpable lymphadenopathy in the cervical, axillary  LUNGS: clear to auscultation and percussion with normal breathing effort HEART: regular rate & rhythm and no murmurs and no lower extremity edema ABDOMEN:abdomen soft, non-tender and normal bowel sounds Musculoskeletal:no cyanosis of digits and no clubbing  NEURO: alert & oriented x 3 with fluent speech, no focal motor/sensory deficits  Physical Exam    LABORATORY DATA:  I have reviewed the data as listed    Latest Ref Rng & Units 06/10/2024    8:30 AM 05/27/2024    8:54 AM 05/13/2024    9:02 AM  CBC  WBC 4.0 - 10.5 K/uL 4.9  5.7  3.7   Hemoglobin 12.0 - 15.0 g/dL 89.3  88.3  89.8   Hematocrit 36.0 - 46.0 % 32.5  36.6  32.3   Platelets 150 - 400 K/uL 107  113  96         Latest Ref Rng & Units 06/10/2024    8:30 AM 05/27/2024    8:54 AM 05/13/2024    9:02 AM  CMP  Glucose 70 - 99 mg/dL 96  97  97   BUN 6 - 20 mg/dL <5  <5  6   Creatinine 0.44 - 1.00 mg/dL 9.54  9.45  9.50   Sodium 135 - 145 mmol/L 139  140  141   Potassium 3.5 - 5.1 mmol/L 3.3  3.5  3.3   Chloride 98 - 111 mmol/L 103  104  108   CO2 22 - 32 mmol/L 27  26  28    Calcium  8.9 - 10.3 mg/dL 8.9  9.4  8.7   Total Protein 6.5 - 8.1 g/dL 7.1  7.7  6.7   Total Bilirubin 0.0 - 1.2 mg/dL 0.5  0.4  0.3   Alkaline Phos 38 - 126 U/L 137  142  112   AST 15 - 41 U/L 22  24  15    ALT 0 - 44 U/L 16  16  11        RADIOGRAPHIC STUDIES: I have personally reviewed the radiological images as listed and agreed with the findings in the report. No results found.    No orders of the defined types were placed in this encounter.  All questions were answered. The patient knows to call the  clinic with any problems, questions or concerns. No barriers to learning was detected. The total time spent in the appointment was 25 minutes, including review of chart and various tests results,  discussions about plan of care and coordination of care plan     Onita Mattock, MD 06/10/2024

## 2024-06-12 ENCOUNTER — Inpatient Hospital Stay

## 2024-06-12 VITALS — BP 119/79 | HR 91 | Temp 98.8°F | Resp 17

## 2024-06-12 DIAGNOSIS — Z5111 Encounter for antineoplastic chemotherapy: Secondary | ICD-10-CM | POA: Diagnosis not present

## 2024-06-12 DIAGNOSIS — C186 Malignant neoplasm of descending colon: Secondary | ICD-10-CM

## 2024-06-12 MED ORDER — PEGFILGRASTIM-CBQV 6 MG/0.6ML ~~LOC~~ SOSY
6.0000 mg | PREFILLED_SYRINGE | Freq: Once | SUBCUTANEOUS | Status: AC
  Administered 2024-06-12: 6 mg via SUBCUTANEOUS
  Filled 2024-06-12: qty 0.6

## 2024-06-12 NOTE — Patient Instructions (Signed)

## 2024-06-13 ENCOUNTER — Other Ambulatory Visit: Payer: Self-pay | Admitting: Nurse Practitioner

## 2024-06-17 DIAGNOSIS — J9611 Chronic respiratory failure with hypoxia: Secondary | ICD-10-CM | POA: Diagnosis not present

## 2024-06-17 DIAGNOSIS — G4733 Obstructive sleep apnea (adult) (pediatric): Secondary | ICD-10-CM | POA: Diagnosis not present

## 2024-06-18 ENCOUNTER — Encounter: Payer: Self-pay | Admitting: Hematology

## 2024-07-01 ENCOUNTER — Encounter: Payer: Self-pay | Admitting: Hematology

## 2024-08-09 ENCOUNTER — Ambulatory Visit: Admitting: Internal Medicine

## 2024-10-01 ENCOUNTER — Ambulatory Visit: Admitting: Internal Medicine
